# Patient Record
Sex: Female | Born: 1965 | Race: White | Hispanic: No | Smoking: Never smoker
Health system: Southern US, Community
[De-identification: ages and names within clinical notes are randomized; demographics above are authoritative.]

## PROBLEM LIST (undated history)

## (undated) DIAGNOSIS — T85838A Hemorrhage due to other internal prosthetic devices, implants and grafts, initial encounter: Secondary | ICD-10-CM

## (undated) DIAGNOSIS — F419 Anxiety disorder, unspecified: Secondary | ICD-10-CM

## (undated) DIAGNOSIS — D649 Anemia, unspecified: Secondary | ICD-10-CM

## (undated) DIAGNOSIS — C801 Malignant (primary) neoplasm, unspecified: Secondary | ICD-10-CM

## (undated) DIAGNOSIS — M797 Fibromyalgia: Secondary | ICD-10-CM

## (undated) DIAGNOSIS — F329 Major depressive disorder, single episode, unspecified: Secondary | ICD-10-CM

## (undated) DIAGNOSIS — F32A Depression, unspecified: Secondary | ICD-10-CM

## (undated) DIAGNOSIS — K219 Gastro-esophageal reflux disease without esophagitis: Secondary | ICD-10-CM

## (undated) DIAGNOSIS — K5900 Constipation, unspecified: Secondary | ICD-10-CM

## (undated) HISTORY — PX: GASTRIC BYPASS: SHX52

## (undated) HISTORY — PX: ABDOMINAL HYSTERECTOMY: SHX81

## (undated) HISTORY — DX: Fibromyalgia: M79.7

---

## 2009-04-10 ENCOUNTER — Emergency Department (HOSPITAL_COMMUNITY): Admission: EM | Admit: 2009-04-10 | Discharge: 2009-04-10 | Payer: Self-pay | Admitting: Orthopaedic Surgery

## 2009-08-12 ENCOUNTER — Emergency Department (HOSPITAL_COMMUNITY): Admission: EM | Admit: 2009-08-12 | Discharge: 2009-08-13 | Payer: Self-pay | Admitting: Emergency Medicine

## 2010-09-28 ENCOUNTER — Emergency Department (HOSPITAL_COMMUNITY): Payer: Self-pay

## 2010-09-28 ENCOUNTER — Emergency Department (HOSPITAL_COMMUNITY)
Admission: EM | Admit: 2010-09-28 | Discharge: 2010-09-28 | Disposition: A | Discharge status: Home / Self Care | Payer: Self-pay | Attending: Emergency Medicine | Admitting: Emergency Medicine

## 2010-09-28 DIAGNOSIS — M069 Rheumatoid arthritis, unspecified: Secondary | ICD-10-CM | POA: Insufficient documentation

## 2010-09-28 DIAGNOSIS — R071 Chest pain on breathing: Secondary | ICD-10-CM | POA: Insufficient documentation

## 2010-09-28 DIAGNOSIS — S0003XA Contusion of scalp, initial encounter: Secondary | ICD-10-CM | POA: Insufficient documentation

## 2010-09-28 DIAGNOSIS — S20219A Contusion of unspecified front wall of thorax, initial encounter: Secondary | ICD-10-CM | POA: Insufficient documentation

## 2010-09-28 DIAGNOSIS — S0990XA Unspecified injury of head, initial encounter: Secondary | ICD-10-CM | POA: Insufficient documentation

## 2010-10-06 ENCOUNTER — Emergency Department (HOSPITAL_COMMUNITY)
Admission: EM | Admit: 2010-10-06 | Discharge: 2010-10-06 | Disposition: A | Discharge status: Home / Self Care | Payer: Self-pay | Attending: Emergency Medicine | Admitting: Emergency Medicine

## 2010-10-10 LAB — POCT CARDIAC MARKERS
Myoglobin, poc: 35.8 ng/mL (ref 12–200)
Troponin i, poc: <0.05 ng/mL (ref 0.00–0.09)

## 2010-10-10 LAB — CBC
HCT: 35.6 % — ABNORMAL LOW (ref 36.0–46.0)
Platelets: 193 10*3/uL (ref 150–400)
RBC: 4.12 MIL/uL (ref 3.87–5.11)
RDW: 13.5 % (ref 11.5–15.5)

## 2010-10-10 LAB — URINE CULTURE: Culture: NO GROWTH

## 2010-10-10 LAB — URINALYSIS, ROUTINE W REFLEX MICROSCOPIC
Bilirubin Urine: NEGATIVE
Hgb urine dipstick: NEGATIVE
Ketones, ur: NEGATIVE mg/dL
Nitrite: NEGATIVE
Specific Gravity, Urine: 1.023 (ref 1.005–1.030)
pH: 6 (ref 5.0–8.0)

## 2010-10-10 LAB — POCT I-STAT, CHEM 8
Chloride: 108 mEq/L (ref 96–112)
TCO2: 27 mmol/L (ref 0–100)

## 2010-10-10 LAB — URINE MICROSCOPIC-ADD ON

## 2010-10-10 LAB — COMPREHENSIVE METABOLIC PANEL
ALT: 24 U/L (ref 0–35)
CO2: 28 mEq/L (ref 19–32)
Chloride: 107 mEq/L (ref 96–112)
GFR calc Af Amer: >60 mL/min (ref >60)
Potassium: 3.6 mEq/L (ref 3.5–5.1)
Total Bilirubin: 0.5 mg/dL (ref 0.3–1.2)
Total Protein: 6.7 g/dL (ref 6.0–8.3)

## 2010-10-10 LAB — DIFFERENTIAL
Basophils Absolute: 0 10*3/uL (ref 0.0–0.1)
Monocytes Relative: 9 % (ref 3–12)
Neutro Abs: 2.3 10*3/uL (ref 1.7–7.7)
Neutrophils Relative %: 40 % — ABNORMAL LOW (ref 43–77)

## 2010-10-29 LAB — BASIC METABOLIC PANEL
BUN: 11 mg/dL (ref 6–23)
CO2: 27 mEq/L (ref 19–32)
Calcium: 9.4 mg/dL (ref 8.4–10.5)
Creatinine, Ser: 0.51 mg/dL (ref 0.4–1.2)
GFR calc Af Amer: >60 mL/min (ref >60)
Potassium: 3.7 mEq/L (ref 3.5–5.1)

## 2010-10-29 LAB — POCT CARDIAC MARKERS
CKMB, poc: <1 ng/mL — ABNORMAL LOW (ref 1.0–8.0)
Troponin i, poc: <0.05 ng/mL (ref 0.00–0.09)

## 2010-10-29 LAB — CBC
HCT: 32 % — ABNORMAL LOW (ref 36.0–46.0)
RDW: 13 % (ref 11.5–15.5)

## 2015-04-01 ENCOUNTER — Emergency Department (HOSPITAL_COMMUNITY): Payer: Commercial Managed Care - HMO

## 2015-04-01 ENCOUNTER — Encounter (HOSPITAL_COMMUNITY): Payer: Self-pay | Admitting: *Deleted

## 2015-04-01 ENCOUNTER — Inpatient Hospital Stay (HOSPITAL_COMMUNITY)
Admission: EM | Admit: 2015-04-01 | Discharge: 2015-04-07 | DRG: 373 | Disposition: A | Discharge status: Home Health | Payer: Commercial Managed Care - HMO | Attending: General Surgery | Admitting: General Surgery

## 2015-04-01 DIAGNOSIS — M797 Fibromyalgia: Secondary | ICD-10-CM | POA: Diagnosis present

## 2015-04-01 DIAGNOSIS — Z880 Allergy status to penicillin: Secondary | ICD-10-CM

## 2015-04-01 DIAGNOSIS — D649 Anemia, unspecified: Secondary | ICD-10-CM | POA: Diagnosis present

## 2015-04-01 DIAGNOSIS — K3533 Acute appendicitis with perforation and localized peritonitis, with abscess: Secondary | ICD-10-CM | POA: Insufficient documentation

## 2015-04-01 DIAGNOSIS — B9620 Unspecified Escherichia coli [E. coli] as the cause of diseases classified elsewhere: Secondary | ICD-10-CM | POA: Diagnosis present

## 2015-04-01 DIAGNOSIS — K353 Acute appendicitis with localized peritonitis: Principal | ICD-10-CM | POA: Diagnosis present

## 2015-04-01 DIAGNOSIS — Z888 Allergy status to other drugs, medicaments and biological substances status: Secondary | ICD-10-CM

## 2015-04-01 DIAGNOSIS — Z9884 Bariatric surgery status: Secondary | ICD-10-CM

## 2015-04-01 DIAGNOSIS — K3580 Unspecified acute appendicitis: Secondary | ICD-10-CM

## 2015-04-01 DIAGNOSIS — B954 Other streptococcus as the cause of diseases classified elsewhere: Secondary | ICD-10-CM | POA: Diagnosis present

## 2015-04-01 DIAGNOSIS — R1031 Right lower quadrant pain: Secondary | ICD-10-CM | POA: Diagnosis not present

## 2015-04-01 DIAGNOSIS — K3532 Acute appendicitis with perforation and localized peritonitis, without abscess: Secondary | ICD-10-CM

## 2015-04-01 DIAGNOSIS — E876 Hypokalemia: Secondary | ICD-10-CM | POA: Diagnosis not present

## 2015-04-01 HISTORY — DX: Anemia, unspecified: D64.9

## 2015-04-01 LAB — URINALYSIS, ROUTINE W REFLEX MICROSCOPIC
GLUCOSE, UA: NEGATIVE mg/dL
NITRITE: NEGATIVE
PH: 5.5 (ref 5.0–8.0)
Protein, ur: 100 mg/dL — AB
Specific Gravity, Urine: 1.027 (ref 1.005–1.030)
Urobilinogen, UA: 1 mg/dL (ref 0.0–1.0)

## 2015-04-01 LAB — CBC
HCT: 30.2 % — ABNORMAL LOW (ref 36.0–46.0)
Hemoglobin: 9.2 g/dL — ABNORMAL LOW (ref 12.0–15.0)
MCH: 22.8 pg — ABNORMAL LOW (ref 26.0–34.0)
MCHC: 30.5 g/dL (ref 30.0–36.0)
MCV: 74.9 fL — AB (ref 78.0–100.0)
PLATELETS: 284 10*3/uL (ref 150–400)
RBC: 4.03 MIL/uL (ref 3.87–5.11)
RDW: 16.9 % — AB (ref 11.5–15.5)
WBC: 12.5 10*3/uL — AB (ref 4.0–10.5)

## 2015-04-01 LAB — URINE MICROSCOPIC-ADD ON

## 2015-04-01 LAB — COMPREHENSIVE METABOLIC PANEL
ALK PHOS: 107 U/L (ref 38–126)
ALT: 16 U/L (ref 14–54)
AST: 20 U/L (ref 15–41)
Albumin: 3.6 g/dL (ref 3.5–5.0)
Anion gap: 13 (ref 5–15)
BILIRUBIN TOTAL: 0.8 mg/dL (ref 0.3–1.2)
BUN: 10 mg/dL (ref 6–20)
CHLORIDE: 96 mmol/L — AB (ref 101–111)
CO2: 24 mmol/L (ref 22–32)
CREATININE: 0.58 mg/dL (ref 0.44–1.00)
Calcium: 9.6 mg/dL (ref 8.9–10.3)
GFR calc Af Amer: >60 mL/min (ref >60)
Glucose, Bld: 96 mg/dL (ref 65–99)
Potassium: 3.8 mmol/L (ref 3.5–5.1)
Sodium: 133 mmol/L — ABNORMAL LOW (ref 135–145)
Total Protein: 8 g/dL (ref 6.5–8.1)

## 2015-04-01 LAB — LIPASE, BLOOD: Lipase: 14 U/L — ABNORMAL LOW (ref 22–51)

## 2015-04-01 MED ORDER — SODIUM CHLORIDE 0.9 % IV BOLUS (SEPSIS)
1000.0000 mL | Freq: Once | INTRAVENOUS | Status: AC
  Administered 2015-04-01: 1000 mL via INTRAVENOUS

## 2015-04-01 MED ORDER — IOHEXOL 300 MG/ML  SOLN
100.0000 mL | Freq: Once | INTRAMUSCULAR | Status: AC | PRN
  Administered 2015-04-01: 100 mL via INTRAVENOUS

## 2015-04-01 MED ORDER — MORPHINE SULFATE (PF) 4 MG/ML IV SOLN
4.0000 mg | Freq: Once | INTRAVENOUS | Status: AC
  Administered 2015-04-01: 4 mg via INTRAVENOUS
  Filled 2015-04-01: qty 1

## 2015-04-01 MED ORDER — IOHEXOL 300 MG/ML  SOLN
25.0000 mL | Freq: Once | INTRAMUSCULAR | Status: AC | PRN
  Administered 2015-04-01: 25 mL via ORAL

## 2015-04-01 MED ORDER — SODIUM CHLORIDE 0.9 % IV BOLUS (SEPSIS)
1000.0000 mL | Freq: Once | INTRAVENOUS | Status: AC
  Administered 2015-04-02: 1000 mL via INTRAVENOUS

## 2015-04-01 MED ORDER — CIPROFLOXACIN IN D5W 400 MG/200ML IV SOLN
400.0000 mg | Freq: Once | INTRAVENOUS | Status: AC
  Administered 2015-04-02: 400 mg via INTRAVENOUS
  Filled 2015-04-01: qty 200

## 2015-04-01 MED ORDER — METRONIDAZOLE IN NACL 5-0.79 MG/ML-% IV SOLN
500.0000 mg | Freq: Once | INTRAVENOUS | Status: AC
  Administered 2015-04-02: 500 mg via INTRAVENOUS
  Filled 2015-04-01: qty 100

## 2015-04-01 NOTE — ED Notes (Signed)
Surgery MD at bedside.

## 2015-04-01 NOTE — ED Notes (Signed)
Pt reports RLQ pain with fever x 2 days.  Went to UC and was sent here for possible appy.

## 2015-04-01 NOTE — H&P (Signed)
[name] is an 49 y.o. female.   Chief Complaint: rlq pain HPI: 71 yof with history of lap gastric bypass in [place] presents with just over 2d of worsening rlq pain.  This has not gone away, nothing relieving it. Thought she was constipated/bloated and tried laxative. Has been having bms though.  No vomiting. Some nausea.  Febrile at home yesterday.  Urinating.  She has not ever had colonoscopy.  She presented to er today and underwent ct scan that appears to show perf appy with abscess  Past Medical History  Diagnosis Date  . Anemia   fibromyalgia  Past Surgical History  Procedure Laterality Date  . Abdominal hysterectomy    . Gastric bypass      No family history on file. Social History:  reports that she has never smoked. She does not have any smokeless tobacco history on file. She reports that she drinks alcohol. She reports that she does not use illicit drugs.  Allergies:  Allergies  Allergen Reactions  . Cymbalta [Duloxetine Hcl] Rash  . Penicillins Rash    Just a bad rash, no deathly reactions    meds tramadol, flexeril, lexapro  Results for orders placed or performed during the hospital encounter of 04/01/15 (from the past 48 hour(s))  Lipase, blood     Status: Abnormal   Collection Time: 04/01/15  9:40 PM  Result Value Ref Range   Lipase 14 (L) 22 - 51 U/L  Comprehensive metabolic panel     Status: Abnormal   Collection Time: 04/01/15  9:40 PM  Result Value Ref Range   Sodium 133 (L) 135 - 145 mmol/L   Potassium 3.8 3.5 - 5.1 mmol/L   Chloride 96 (L) 101 - 111 mmol/L   CO2 24 22 - 32 mmol/L   Glucose, Bld 96 65 - 99 mg/dL   BUN 10 6 - 20 mg/dL   Creatinine, Ser 0.58 0.44 - 1.00 mg/dL   Calcium 9.6 8.9 - 10.3 mg/dL   Total Protein 8.0 6.5 - 8.1 g/dL   Albumin 3.6 3.5 - 5.0 g/dL   AST 20 15 - 41 U/L   ALT 16 14 - 54 U/L   Alkaline Phosphatase 107 38 - 126 U/L   Total Bilirubin 0.8 0.3 - 1.2 mg/dL   GFR calc non Af Amer >60 >60 mL/min   GFR calc Af  Amer >60 >60 mL/min    Comment: (NOTE) The eGFR has been calculated using the CKD EPI equation. This calculation has not been validated in all clinical situations. eGFR's persistently <60 mL/min signify possible Chronic Kidney Disease.    Anion gap 13 5 - 15  CBC     Status: Abnormal   Collection Time: 04/01/15  9:40 PM  Result Value Ref Range   WBC 12.5 (H) 4.0 - 10.5 K/uL   RBC 4.03 3.87 - 5.11 MIL/uL   Hemoglobin 9.2 (L) 12.0 - 15.0 g/dL   HCT 30.2 (L) 36.0 - 46.0 %   MCV 74.9 (L) 78.0 - 100.0 fL   MCH 22.8 (L) 26.0 - 34.0 pg   MCHC 30.5 30.0 - 36.0 g/dL   RDW 16.9 (H) 11.5 - 15.5 %   Platelets 284 150 - 400 K/uL  Urinalysis, Routine w reflex microscopic (not at [facility])     Status: Abnormal   Collection Time: 04/01/15  9:55 PM  Result Value Ref Range   Color, Urine YELLOW YELLOW   APPearance CLEAR CLEAR   Specific Gravity, Urine 1.027 1.005 -  1.030   pH 5.5 5.0 - 8.0   Glucose, UA NEGATIVE NEGATIVE mg/dL   Hgb urine dipstick TRACE (A) NEGATIVE   Bilirubin Urine SMALL (A) NEGATIVE   Ketones, ur >80 (A) NEGATIVE mg/dL   Protein, ur 100 (A) NEGATIVE mg/dL   Urobilinogen, UA 1.0 0.0 - 1.0 mg/dL   Nitrite NEGATIVE NEGATIVE   Leukocytes, UA TRACE (A) NEGATIVE  Urine microscopic-add on     Status: None   Collection Time: 04/01/15  9:55 PM  Result Value Ref Range   Squamous Epithelial / LPF RARE RARE   WBC, UA 3-6 <3 WBC/hpf   Urine-Other MUCOUS PRESENT    Ct Abdomen Pelvis W Contrast  04/01/2015   CLINICAL DATA:  Right lower quadrant pain with fever for 2 days  EXAM: CT ABDOMEN AND PELVIS WITH CONTRAST  TECHNIQUE: Multidetector CT imaging of the abdomen and pelvis was performed using the standard protocol following bolus administration of intravenous contrast.  CONTRAST:  4m OMNIPAQUE IOHEXOL 300 MG/ML SOLN, 104mOMNIPAQUE IOHEXOL 300 MG/ML SOLN  COMPARISON:  None.  FINDINGS: Lower chest: Partially visualized pulmonary nodule right lower lobe measuring at least 5 mm. Left lower  lobe pulmonary nodule measures 4 mm.  Hepatobiliary: Normal  Pancreas: Normal  Spleen: Normal  Adrenals/Urinary Tract: Normal  Stomach/Bowel: Status post gastric bypass. Nonobstructive gas pattern. Inferior to the cecal tip, there is a complex multiloculated fluid collection with thick enhancing walls measuring about 6.5 x 4.5 cm. There is surrounding inflammatory change. 5 mm focus of calcification is seen along the anterior aspect of this abnormality. Appendix is not well seen, but there is the suggestion of a dilated tubular structure in the region measuring up to about 2 cm.  Vascular/Lymphatic: Minimal atherosclerotic calcification of the aorta  Reproductive: Not seen  Musculoskeletal: No acute findings  IMPRESSION: Appendix not well identified, but the findings are most consistent with appendicitis, perforated, with periappendiceal abscess.  Pulmonary nodules. If the patient is at high risk for bronchogenic carcinoma, follow-up chest CT at 6-12 months is recommended. If the patient is at low risk for bronchogenic carcinoma, follow-up chest CT at 12 months is recommended. This recommendation follows the consensus statement: Guidelines for Management of Small Pulmonary Nodules Detected on CT Scans: A Statement from the [employer] published in Radiology 2005;237:395-400.   Electronically Signed   By: RaSkipper Cliche.D.   On: 04/01/2015 22:44    Review of Systems  Constitutional: Positive for fever and chills.  Respiratory: Negative for shortness of breath.   Cardiovascular: Negative for chest pain.  Gastrointestinal: Positive for nausea, abdominal pain and constipation. Negative for vomiting and diarrhea.  Musculoskeletal: Positive for back pain.    Blood pressure 105/60, pulse 108, temperature 102.1 F (38.9 C), temperature source Oral, resp. rate 18, height 5' 1"  (1.549 m), weight 65.772 kg (145 lb), SpO2 98 %. Physical Exam  Vitals reviewed. Constitutional: She appears  well-developed and well-nourished.  HENT:  Head: Normocephalic and atraumatic.  Eyes: No scleral icterus.  Neck: Neck supple.  Cardiovascular: Regular rhythm.  Tachycardia present.   Respiratory: Effort normal and breath sounds normal. She has no wheezes. She has no rales.  GI: Soft. Bowel sounds are normal. There is tenderness in the right lower quadrant. There is tenderness at McBurney's point. No hernia.  Multiple well healed scars      Assessment/Plan Perforated appendicitis  It appears clinically and radiologically she has perf appendicitis with abscess formation.  Will admit, iv abx(pcn allergy), hydrate,  recheck labs in am.  Will ask ir to see if amenable to ct guided drainage.  Discussed treatment with abx and drains for time being. If improves then will treat that way with consideration of interval appy in 6-8 weeks.  Will also need csc at some point.  If no better or cannot drain then will need to consider surgery.  Discussed plan with patient and her husband  [facility] 04/01/2015, 11:56 PM

## 2015-04-01 NOTE — ED Notes (Signed)
Patient transported to CT 

## 2015-04-01 NOTE — ED Notes (Signed)
Bed: FT73 Expected date:  Expected time:  Means of arrival:  Comments: T2

## 2015-04-01 NOTE — ED Provider Notes (Signed)
CSN: [id number]     Arrival date & time 04/01/15  2036 History   First MD Initiated Contact with Patient 04/01/15 2122     Chief Complaint  Patient presents with  . Abdominal Pain     (Consider location/radiation/quality/duration/timing/severity/associated sxs/prior Treatment) HPI [name] is a 49 y.o. female with history of anemia, comes in for evaluation of abdominal pain. Patient is referred here from urgent care facility for rule out appendicitis. Patient states for the past 2 days she has had right-sided abdominal pain with associated fever. She reports a fever of 102F. She also reports she has had this abdominal pain intermittently since September of last year, but is worsened and become constant over the past 2 days. She denies any nausea, vomiting, chest pain or shortness of breath, urinary symptoms, pelvic pain, diarrhea or constipation. She likens her pain to "gas pains". She rates it as a 7/10. No other aggravating or modifying factors.  Past Medical History  Diagnosis Date  . Anemia    Past Surgical History  Procedure Laterality Date  . Abdominal hysterectomy    . Gastric bypass     No family history on file. Social History  Substance Use Topics  . Smoking status: Never Smoker   . Smokeless tobacco: None  . Alcohol Use: Yes   OB History    No data available     Review of Systems A 10 point review of systems was completed and was negative except for pertinent positives and negatives as mentioned in the history of present illness     Allergies  Cymbalta and Penicillins  Home Medications   Prior to Admission medications   Medication Sig Start Date End Date Taking? Authorizing Provider  acetaminophen (TYLENOL) 500 MG tablet Take 1,000 mg by mouth every 6 (six) hours as needed for fever.   Yes Historical Provider, MD  cyclobenzaprine (FLEXERIL) 10 MG tablet Take 10 mg by mouth 3 (three) times daily as needed for muscle spasms.   Yes Historical Provider, MD   escitalopram (LEXAPRO) 10 MG tablet Take 10 mg by mouth daily.   Yes Historical Provider, MD  ferrous sulfate 325 (65 FE) MG tablet Take 325 mg by mouth 2 (two) times daily with a meal.   Yes Historical Provider, MD  traMADol (ULTRAM) 50 MG tablet Take 50 mg by mouth every 6 (six) hours as needed for moderate pain.   Yes Historical Provider, MD  vitamin B-12 (CYANOCOBALAMIN) 1000 MCG tablet Take 500 mcg by mouth daily.   Yes Historical Provider, MD  vitamin C (ASCORBIC ACID) 500 MG tablet Take 500 mg by mouth daily.   Yes Historical Provider, MD   BP 105/60 mmHg  Pulse 108  Temp(Src) 102.1 F (38.9 C) (Oral)  Resp 18  Ht 5\' 1"  (1.549 m)  Wt 145 lb (65.772 kg)  BMI 27.41 kg/m2  SpO2 98% Physical Exam  Constitutional: She is oriented to person, place, and time. She appears well-developed and well-nourished.  HENT:  Head: Normocephalic and atraumatic.  Mouth/Throat: Oropharynx is clear and moist.  Eyes: Conjunctivae are normal. Pupils are equal, round, and reactive to light. Right eye exhibits no discharge. Left eye exhibits no discharge. No scleral icterus.  Neck: Neck supple.  Cardiovascular: Regular rhythm and normal heart sounds.   Tachycardic with normal heart sounds  Pulmonary/Chest: Effort normal and breath sounds normal. No respiratory distress. She has no wheezes. She has no rales.  Abdominal: Soft.  Tenderness in right upper quadrant and moderately in  right lower quadrant. Abdomen is otherwise soft, nondistended without peritoneal signs.  Musculoskeletal: She exhibits no tenderness.  Neurological: She is alert and oriented to person, place, and time.  Cranial Nerves II-XII grossly intact  Skin: Skin is warm and dry. No rash noted.  Psychiatric: She has a normal mood and affect.  Nursing note and vitals reviewed.   ED Course  Procedures (including critical care time) Labs Review Labs Reviewed  LIPASE, BLOOD - Abnormal; Notable for the following:    Lipase 14 (*)     All other components within normal limits  COMPREHENSIVE METABOLIC PANEL - Abnormal; Notable for the following:    Sodium 133 (*)    Chloride 96 (*)    All other components within normal limits  CBC - Abnormal; Notable for the following:    WBC 12.5 (*)    Hemoglobin 9.2 (*)    HCT 30.2 (*)    MCV 74.9 (*)    MCH 22.8 (*)    RDW 16.9 (*)    All other components within normal limits  URINALYSIS, ROUTINE W REFLEX MICROSCOPIC (NOT AT [facility]) - Abnormal; Notable for the following:    Hgb urine dipstick TRACE (*)    Bilirubin Urine SMALL (*)    Ketones, ur >80 (*)    Protein, ur 100 (*)    Leukocytes, UA TRACE (*)    All other components within normal limits  URINE MICROSCOPIC-ADD ON    Imaging Review Ct Abdomen Pelvis W Contrast  04/01/2015   CLINICAL DATA:  Right lower quadrant pain with fever for 2 days  EXAM: CT ABDOMEN AND PELVIS WITH CONTRAST  TECHNIQUE: Multidetector CT imaging of the abdomen and pelvis was performed using the standard protocol following bolus administration of intravenous contrast.  CONTRAST:  49mL OMNIPAQUE IOHEXOL 300 MG/ML SOLN, 154mL OMNIPAQUE IOHEXOL 300 MG/ML SOLN  COMPARISON:  None.  FINDINGS: Lower chest: Partially visualized pulmonary nodule right lower lobe measuring at least 5 mm. Left lower lobe pulmonary nodule measures 4 mm.  Hepatobiliary: Normal  Pancreas: Normal  Spleen: Normal  Adrenals/Urinary Tract: Normal  Stomach/Bowel: Status post gastric bypass. Nonobstructive gas pattern. Inferior to the cecal tip, there is a complex multiloculated fluid collection with thick enhancing walls measuring about 6.5 x 4.5 cm. There is surrounding inflammatory change. 5 mm focus of calcification is seen along the anterior aspect of this abnormality. Appendix is not well seen, but there is the suggestion of a dilated tubular structure in the region measuring up to about 2 cm.  Vascular/Lymphatic: Minimal atherosclerotic calcification of the aorta  Reproductive: Not seen   Musculoskeletal: No acute findings  IMPRESSION: Appendix not well identified, but the findings are most consistent with appendicitis, perforated, with periappendiceal abscess.  Pulmonary nodules. If the patient is at high risk for bronchogenic carcinoma, follow-up chest CT at 6-12 months is recommended. If the patient is at low risk for bronchogenic carcinoma, follow-up chest CT at 12 months is recommended. This recommendation follows the consensus statement: Guidelines for Management of Small Pulmonary Nodules Detected on CT Scans: A Statement from the [place] as published in Radiology 2005;237:395-400.   Electronically Signed   By: Skipper Cliche M.D.   On: 04/01/2015 22:44   I have personally reviewed and evaluated these images and lab results as part of my medical decision-making.   EKG Interpretation None     Meds given in ED:  Medications  sodium chloride 0.9 % bolus 1,000 mL (not administered)  ciprofloxacin (CIPRO) IVPB  400 mg (not administered)  metroNIDAZOLE (FLAGYL) IVPB 500 mg (not administered)  iohexol (OMNIPAQUE) 300 MG/ML solution 25 mL (25 mLs Oral Contrast Given 04/01/15 2130)  sodium chloride 0.9 % bolus 1,000 mL (0 mLs Intravenous Stopped 04/01/15 2257)  morphine 4 MG/ML injection 4 mg (4 mg Intravenous Given 04/01/15 2159)  iohexol (OMNIPAQUE) 300 MG/ML solution 100 mL (100 mLs Intravenous Contrast Given 04/01/15 2227)    New Prescriptions   No medications on file   Filed Vitals:   04/01/15 2110 04/01/15 2300 04/01/15 2330  BP: 114/72 112/65 105/60  Pulse: 121 107 108  Temp: 102.1 F (38.9 C)    TempSrc: Oral    Resp: 18    Height: 5\' 1"  (1.549 m)    Weight: 145 lb (65.772 kg)    SpO2: 100% 98% 98%    MDM  [name] is a 49 y.o. female with history of anemia comes in for evaluation of right lower quadrant pain. Patient has had this pain intermittently over the past year, worse over the last 2 days. Patient last ate at 1:00 PM On arrival patient is  febrile to 102.1, tachycardic to 121. Tachycardia improves to 107 with IV fluids. On exam, patient is tender diffusely throughout right side, worse in right upper quadrant. Basic labs showed leukocytosis of 12.5, hemoglobin is 9.2 CT abdomen pelvis shows findings are most consistent with appendicitis, perforated, with periappendiceal abscess. Discussed with general surgery, Dr. Donne [name] to see in the ED. Patient admitted. Final diagnoses:  Acute appendicitis, unspecified acute appendicitis type        Comer Locket, PA-C 04/02/15 5747  Gareth Morgan, MD 04/03/15 1229

## 2015-04-02 DIAGNOSIS — R1031 Right lower quadrant pain: Secondary | ICD-10-CM | POA: Diagnosis present

## 2015-04-02 DIAGNOSIS — Z888 Allergy status to other drugs, medicaments and biological substances status: Secondary | ICD-10-CM | POA: Diagnosis not present

## 2015-04-02 DIAGNOSIS — K3532 Acute appendicitis with perforation and localized peritonitis, without abscess: Secondary | ICD-10-CM | POA: Diagnosis present

## 2015-04-02 DIAGNOSIS — E876 Hypokalemia: Secondary | ICD-10-CM | POA: Diagnosis not present

## 2015-04-02 DIAGNOSIS — M797 Fibromyalgia: Secondary | ICD-10-CM | POA: Diagnosis present

## 2015-04-02 DIAGNOSIS — B954 Other streptococcus as the cause of diseases classified elsewhere: Secondary | ICD-10-CM | POA: Diagnosis present

## 2015-04-02 DIAGNOSIS — B9620 Unspecified Escherichia coli [E. coli] as the cause of diseases classified elsewhere: Secondary | ICD-10-CM | POA: Diagnosis present

## 2015-04-02 DIAGNOSIS — K3533 Acute appendicitis with perforation and localized peritonitis, with abscess: Secondary | ICD-10-CM | POA: Insufficient documentation

## 2015-04-02 DIAGNOSIS — K353 Acute appendicitis with localized peritonitis: Secondary | ICD-10-CM | POA: Diagnosis present

## 2015-04-02 DIAGNOSIS — D649 Anemia, unspecified: Secondary | ICD-10-CM | POA: Diagnosis present

## 2015-04-02 DIAGNOSIS — Z880 Allergy status to penicillin: Secondary | ICD-10-CM | POA: Diagnosis not present

## 2015-04-02 DIAGNOSIS — Z9884 Bariatric surgery status: Secondary | ICD-10-CM | POA: Diagnosis not present

## 2015-04-02 LAB — CBC
HCT: 26 % — ABNORMAL LOW (ref 36.0–46.0)
HEMOGLOBIN: 7.9 g/dL — AB (ref 12.0–15.0)
MCH: 22.8 pg — AB (ref 26.0–34.0)
MCHC: 30.4 g/dL (ref 30.0–36.0)
MCV: 74.9 fL — ABNORMAL LOW (ref 78.0–100.0)
PLATELETS: 253 10*3/uL (ref 150–400)
RBC: 3.47 MIL/uL — AB (ref 3.87–5.11)
RDW: 17.1 % — ABNORMAL HIGH (ref 11.5–15.5)
WBC: 10.9 10*3/uL — AB (ref 4.0–10.5)

## 2015-04-02 LAB — BASIC METABOLIC PANEL
ANION GAP: 8 (ref 5–15)
BUN: 5 mg/dL — ABNORMAL LOW (ref 6–20)
CALCIUM: 8.6 mg/dL — AB (ref 8.9–10.3)
CO2: 22 mmol/L (ref 22–32)
CREATININE: 0.51 mg/dL (ref 0.44–1.00)
Chloride: 105 mmol/L (ref 101–111)
GFR calc non Af Amer: >60 mL/min (ref >60)
Glucose, Bld: 95 mg/dL (ref 65–99)
Potassium: 3.3 mmol/L — ABNORMAL LOW (ref 3.5–5.1)
SODIUM: 135 mmol/L (ref 135–145)

## 2015-04-02 LAB — APTT: APTT: 38 s — AB (ref 24–37)

## 2015-04-02 LAB — GENTAMICIN LEVEL, RANDOM

## 2015-04-02 LAB — PROTIME-INR
INR: 1.48 (ref 0.00–1.49)
PROTHROMBIN TIME: 18 s — AB (ref 11.6–15.2)

## 2015-04-02 MED ORDER — ONDANSETRON HCL 4 MG/2ML IJ SOLN: 4.0000 mg | Freq: Four times a day (QID) | INTRAMUSCULAR | Status: DC | PRN

## 2015-04-02 MED ORDER — PANTOPRAZOLE SODIUM 40 MG IV SOLR
40.0000 mg | Freq: Every day | INTRAVENOUS | Status: DC
  Administered 2015-04-02 – 2015-04-06 (×5): 40 mg via INTRAVENOUS
  Filled 2015-04-02 (×5): qty 40

## 2015-04-02 MED ORDER — PNEUMOCOCCAL VAC POLYVALENT 25 MCG/0.5ML IJ INJ: 0.5000 mL | INJECTION | INTRAMUSCULAR | Status: DC

## 2015-04-02 MED ORDER — GENTAMICIN SULFATE 40 MG/ML IJ SOLN
7.0000 mg/kg | Freq: Every day | INTRAVENOUS | Status: DC
  Administered 2015-04-03 – 2015-04-06 (×4): 390 mg via INTRAVENOUS
  Filled 2015-04-02 (×4): qty 9.75

## 2015-04-02 MED ORDER — ACETAMINOPHEN 650 MG RE SUPP: 650.0000 mg | Freq: Four times a day (QID) | RECTAL | Status: DC | PRN

## 2015-04-02 MED ORDER — METRONIDAZOLE IN NACL 5-0.79 MG/ML-% IV SOLN
500.0000 mg | Freq: Three times a day (TID) | INTRAVENOUS | Status: DC
  Administered 2015-04-02 – 2015-04-06 (×12): 500 mg via INTRAVENOUS
  Filled 2015-04-02 (×15): qty 100

## 2015-04-02 MED ORDER — ONDANSETRON 4 MG PO TBDP
4.0000 mg | ORAL_TABLET | Freq: Four times a day (QID) | ORAL | Status: DC | PRN
  Administered 2015-04-05: 4 mg via ORAL
  Filled 2015-04-02: qty 1

## 2015-04-02 MED ORDER — MORPHINE SULFATE (PF) 2 MG/ML IV SOLN
2.0000 mg | INTRAVENOUS | Status: DC | PRN
  Administered 2015-04-02 – 2015-04-06 (×17): 2 mg via INTRAVENOUS
  Filled 2015-04-02 (×18): qty 1

## 2015-04-02 MED ORDER — CIPROFLOXACIN IN D5W 400 MG/200ML IV SOLN
400.0000 mg | Freq: Two times a day (BID) | INTRAVENOUS | Status: DC
  Administered 2015-04-02 – 2015-04-06 (×8): 400 mg via INTRAVENOUS
  Filled 2015-04-02 (×9): qty 200

## 2015-04-02 MED ORDER — GENTAMICIN SULFATE 40 MG/ML IJ SOLN
7.0000 mg/kg | Freq: Every day | INTRAVENOUS | Status: DC
  Administered 2015-04-02: 390 mg via INTRAVENOUS
  Filled 2015-04-02 (×2): qty 9.75

## 2015-04-02 MED ORDER — POTASSIUM CHLORIDE 10 MEQ/100ML IV SOLN
10.0000 meq | INTRAVENOUS | Status: AC
  Administered 2015-04-02 (×5): 10 meq via INTRAVENOUS
  Filled 2015-04-02 (×3): qty 100

## 2015-04-02 MED ORDER — SODIUM CHLORIDE 0.9 % IV SOLN
INTRAVENOUS | Status: DC
  Administered 2015-04-02 – 2015-04-06 (×6): via INTRAVENOUS

## 2015-04-02 MED ORDER — SIMETHICONE 80 MG PO CHEW: 40.0000 mg | CHEWABLE_TABLET | Freq: Four times a day (QID) | ORAL | Status: DC | PRN

## 2015-04-02 MED ORDER — ENOXAPARIN SODIUM 40 MG/0.4ML ~~LOC~~ SOLN: 40.0000 mg | SUBCUTANEOUS | Status: DC

## 2015-04-02 MED ORDER — ACETAMINOPHEN 325 MG PO TABS
650.0000 mg | ORAL_TABLET | Freq: Four times a day (QID) | ORAL | Status: DC | PRN
  Administered 2015-04-02 – 2015-04-06 (×10): 650 mg via ORAL
  Filled 2015-04-02 (×10): qty 2

## 2015-04-02 MED ORDER — METHOCARBAMOL 1000 MG/10ML IJ SOLN
500.0000 mg | Freq: Three times a day (TID) | INTRAMUSCULAR | Status: DC | PRN
  Filled 2015-04-02: qty 5

## 2015-04-02 NOTE — Progress Notes (Signed)
Subjective: Feels a little better  Objective: Vital signs in last 24 hours: Temp:  [98.7 F (37.1 C)-102.1 F (38.9 C)] 98.7 F (37.1 C) (09/08 0355) Pulse Rate:  [85-121] 86 (09/08 0355) Resp:  [16-18] 16 (09/08 0355) BP: (98-114)/(51-72) 110/60 mmHg (09/08 0355) SpO2:  [91 %-100 %] 100 % (09/08 0355) Weight:  [65.772 kg (145 lb)-67.314 kg (148 lb 6.4 oz)] 67.314 kg (148 lb 6.4 oz) (09/08 0355) Last BM Date: 04/01/15  Intake/Output from previous day:   Intake/Output this shift:    GI: soft tender rlq   Lab Results:   Recent Labs  04/01/15 2140 04/02/15 0400  WBC 12.5* 10.9*  HGB 9.2* 7.9*  HCT 30.2* 26.0*  PLT 284 253   BMET  Recent Labs  04/01/15 2140 04/02/15 0400  NA 133* 135  K 3.8 3.3*  CL 96* 105  CO2 24 22  GLUCOSE 96 95  BUN 10 5*  CREATININE 0.58 0.51  CALCIUM 9.6 8.6*   PT/INR No results for input(s): LABPROT, INR in the last 72 hours. ABG No results for input(s): PHART, HCO3 in the last 72 hours.  Invalid input(s): PCO2, PO2  Studies/Results: Ct Abdomen Pelvis W Contrast  04/01/2015   CLINICAL DATA:  Right lower quadrant pain with fever for 2 days  EXAM: CT ABDOMEN AND PELVIS WITH CONTRAST  TECHNIQUE: Multidetector CT imaging of the abdomen and pelvis was performed using the standard protocol following bolus administration of intravenous contrast.  CONTRAST:  24mL OMNIPAQUE IOHEXOL 300 MG/ML SOLN, 126mL OMNIPAQUE IOHEXOL 300 MG/ML SOLN  COMPARISON:  None.  FINDINGS: Lower chest: Partially visualized pulmonary nodule right lower lobe measuring at least 5 mm. Left lower lobe pulmonary nodule measures 4 mm.  Hepatobiliary: Normal  Pancreas: Normal  Spleen: Normal  Adrenals/Urinary Tract: Normal  Stomach/Bowel: Status post gastric bypass. Nonobstructive gas pattern. Inferior to the cecal tip, there is a complex multiloculated fluid collection with thick enhancing walls measuring about 6.5 x 4.5 cm. There is surrounding inflammatory change. 5 mm  focus of calcification is seen along the anterior aspect of this abnormality. Appendix is not well seen, but there is the suggestion of a dilated tubular structure in the region measuring up to about 2 cm.  Vascular/Lymphatic: Minimal atherosclerotic calcification of the aorta  Reproductive: Not seen  Musculoskeletal: No acute findings  IMPRESSION: Appendix not well identified, but the findings are most consistent with appendicitis, perforated, with periappendiceal abscess.  Pulmonary nodules. If the patient is at high risk for bronchogenic carcinoma, follow-up chest CT at 6-12 months is recommended. If the patient is at low risk for bronchogenic carcinoma, follow-up chest CT at 12 months is recommended. This recommendation follows the consensus statement: Guidelines for Management of Small Pulmonary Nodules Detected on CT Scans: A Statement from the [place] as published in Radiology 2005;237:395-400.   Electronically Signed   By: Skipper Cliche M.D.   On: 04/01/2015 22:44    Anti-infectives: Anti-infectives    Start     Dose/Rate Route Frequency Ordered Stop   04/02/15 1200  ciprofloxacin (CIPRO) IVPB 400 mg     400 mg 200 mL/hr over 60 Minutes Intravenous Every 12 hours 04/02/15 0331     04/02/15 1100  metroNIDAZOLE (FLAGYL) IVPB 500 mg     500 mg 100 mL/hr over 60 Minutes Intravenous 3 times per day 04/02/15 0331     04/02/15 0015  gentamicin (GARAMYCIN) 390 mg in dextrose 5 % 100 mL IVPB     7 mg/kg  55 kg (Adjusted) 109.8 mL/hr over 60 Minutes Intravenous Daily at bedtime 04/02/15 0011     04/02/15 0000  ciprofloxacin (CIPRO) IVPB 400 mg     400 mg 200 mL/hr over 60 Minutes Intravenous  Once 04/01/15 2356 04/02/15 0148   04/02/15 0000  metroNIDAZOLE (FLAGYL) IVPB 500 mg     500 mg 100 mL/hr over 60 Minutes Intravenous  Once 04/01/15 2356 04/02/15 0314      Assessment/Plan: HD 2 perf appendicitis  Plan for ir evaluation and hopefully drain placement today Cont abx Will  start lovenox after drain, consider clear liquids Discussed role of surgery for worsening or failure of nonoperative therapy over next 48 hours Hypokalemia- Replace potassium today and recheck in am Anemia- will follow for now  [facility] 04/02/2015

## 2015-04-02 NOTE — Consult Note (Signed)
Chief Complaint: Patient was seen in consultation today for abdominal abscess Chief Complaint  Patient presents with  . Abdominal Pain   at the request of Dr Donne [name]  Referring Physician(s): Dr Rolm Bookbinder  History of Present Illness: [name] is a 49 y.o. female   Rt abd pain constant x 2 days Worsening +fever 102 CT 9/7: IMPRESSION: Appendix not well identified, but the findings are most consistent with appendicitis, perforated, with periappendiceal abscess.  Request for and abscess drain placement Imaging reviewed by Dr Redgie Grayer procedure Now scheduled for same  Past Medical History  Diagnosis Date  . Anemia     Past Surgical History  Procedure Laterality Date  . Abdominal hysterectomy    . Gastric bypass      Allergies: Cymbalta and Penicillins  Medications: Prior to Admission medications   Medication Sig Start Date End Date Taking? Authorizing Provider  acetaminophen (TYLENOL) 500 MG tablet Take 1,000 mg by mouth every 6 (six) hours as needed for fever.   Yes Historical Provider, MD  cyclobenzaprine (FLEXERIL) 10 MG tablet Take 10 mg by mouth 3 (three) times daily as needed for muscle spasms.   Yes Historical Provider, MD  escitalopram (LEXAPRO) 10 MG tablet Take 10 mg by mouth daily.   Yes Historical Provider, MD  ferrous sulfate 325 (65 FE) MG tablet Take 325 mg by mouth 2 (two) times daily with a meal.   Yes Historical Provider, MD  traMADol (ULTRAM) 50 MG tablet Take 50 mg by mouth every 6 (six) hours as needed for moderate pain.   Yes Historical Provider, MD  vitamin B-12 (CYANOCOBALAMIN) 1000 MCG tablet Take 500 mcg by mouth daily.   Yes Historical Provider, MD  vitamin C (ASCORBIC ACID) 500 MG tablet Take 500 mg by mouth daily.   Yes Historical Provider, MD     No family history on file.  Social History   Social History  . Marital Status: Single    Spouse Name: N/A  . Number of Children: N/A  . Years of Education:  N/A   Social History Main Topics  . Smoking status: Never Smoker   . Smokeless tobacco: None  . Alcohol Use: Yes  . Drug Use: No  . Sexual Activity: Not Asked   Other Topics Concern  . None   Social History Narrative  . None    Review of Systems: A 12 point ROS discussed and pertinent positives are indicated in the HPI above.  All other systems are negative.  Review of Systems  Constitutional: Positive for fever, chills, activity change, appetite change and fatigue.  Respiratory: Negative for shortness of breath.   Gastrointestinal: Positive for nausea and abdominal pain.  Neurological: Positive for weakness.  Psychiatric/Behavioral: Negative for behavioral problems and confusion.    Vital Signs: BP 110/60 mmHg  Pulse 86  Temp(Src) 98.7 F (37.1 C) (Oral)  Resp 16  Ht 5\' 1"  (1.549 m)  Wt 148 lb 6.4 oz (67.314 kg)  BMI 28.05 kg/m2  SpO2 100%  Physical Exam  Constitutional: She is oriented to person, place, and time.  Cardiovascular: Normal rate, regular rhythm and normal heart sounds.   Pulmonary/Chest: Effort normal.  Abdominal: Soft. Bowel sounds are normal. There is tenderness.  Musculoskeletal: Normal range of motion.  Neurological: She is alert and oriented to person, place, and time.  Skin: Skin is warm and dry.  Psychiatric: She has a normal mood and affect. Her behavior is normal. Judgment and thought content normal.  Nursing  note and vitals reviewed.   Mallampati Score:  MD Evaluation Airway: WNL Heart: WNL Abdomen: WNL Chest/ Lungs: WNL ASA  Classification: 2 Mallampati/Airway Score: One  Imaging: Ct Abdomen Pelvis W Contrast  04/01/2015   CLINICAL DATA:  Right lower quadrant pain with fever for 2 days  EXAM: CT ABDOMEN AND PELVIS WITH CONTRAST  TECHNIQUE: Multidetector CT imaging of the abdomen and pelvis was performed using the standard protocol following bolus administration of intravenous contrast.  CONTRAST:  4mL OMNIPAQUE IOHEXOL 300 MG/ML  SOLN, 146mL OMNIPAQUE IOHEXOL 300 MG/ML SOLN  COMPARISON:  None.  FINDINGS: Lower chest: Partially visualized pulmonary nodule right lower lobe measuring at least 5 mm. Left lower lobe pulmonary nodule measures 4 mm.  Hepatobiliary: Normal  Pancreas: Normal  Spleen: Normal  Adrenals/Urinary Tract: Normal  Stomach/Bowel: Status post gastric bypass. Nonobstructive gas pattern. Inferior to the cecal tip, there is a complex multiloculated fluid collection with thick enhancing walls measuring about 6.5 x 4.5 cm. There is surrounding inflammatory change. 5 mm focus of calcification is seen along the anterior aspect of this abnormality. Appendix is not well seen, but there is the suggestion of a dilated tubular structure in the region measuring up to about 2 cm.  Vascular/Lymphatic: Minimal atherosclerotic calcification of the aorta  Reproductive: Not seen  Musculoskeletal: No acute findings  IMPRESSION: Appendix not well identified, but the findings are most consistent with appendicitis, perforated, with periappendiceal abscess.  Pulmonary nodules. If the patient is at high risk for bronchogenic carcinoma, follow-up chest CT at 6-12 months is recommended. If the patient is at low risk for bronchogenic carcinoma, follow-up chest CT at 12 months is recommended. This recommendation follows the consensus statement: Guidelines for Management of Small Pulmonary Nodules Detected on CT Scans: A Statement from the [employer] as published in Radiology 2005;237:395-400.   Electronically Signed   By: Skipper Cliche M.D.   On: 04/01/2015 22:44    Labs:  CBC:  Recent Labs  04/01/15 2140 04/02/15 0400  WBC 12.5* 10.9*  HGB 9.2* 7.9*  HCT 30.2* 26.0*  PLT 284 253    COAGS: No results for input(s): INR, APTT in the last 8760 hours.  BMP:  Recent Labs  04/01/15 2140 04/02/15 0400  NA 133* 135  K 3.8 3.3*  CL 96* 105  CO2 24 22  GLUCOSE 96 95  BUN 10 5*  CALCIUM 9.6 8.6*  CREATININE 0.58 0.51    GFRNONAA >60 >60  GFRAA >60 >60    LIVER FUNCTION TESTS:  Recent Labs  04/01/15 2140  BILITOT 0.8  AST 20  ALT 16  ALKPHOS 107  PROT 8.0  ALBUMIN 3.6    TUMOR MARKERS: No results for input(s): AFPTM, CEA, CA199, CHROMGRNA in the last 8760 hours.  Assessment and Plan:  Abd pain x 2 days + fever CT shows Abd abscess--perforated appendix Scheduled for abscess drain placement in Radiology Risks and Benefits discussed with the patient including bleeding, infection, damage to adjacent structures, bowel perforation/fistula connection, and sepsis. All of the patient's questions were answered, patient is agreeable to proceed. Consent signed and in chart.   Thank you for this interesting consult.  I greatly enjoyed meeting [name] and look forward to participating in their care.  A copy of this report was sent to the requesting provider on this date.  Signed: Loa Idler A 04/02/2015, 9:29 AM   I spent a total of 40 Minutes    in face to face in clinical consultation, greater than  50% of which was counseling/coordinating care for and abscess drain

## 2015-04-02 NOTE — Progress Notes (Signed)
ANTIBIOTIC CONSULT NOTE - INITIAL  Pharmacy Consult for Gentamicin Indication: Sepsis  Allergies  Allergen Reactions  . Cymbalta [Duloxetine Hcl] Rash  . Penicillins Rash    Just a bad rash, no deathly reactions    Patient Measurements: Height: 5\' 1"  (154.9 cm) Weight: 145 lb (65.772 kg) IBW/kg (Calculated) : 47.8   Vital Signs: Temp: 101.6 F (38.7 C) (09/08 0014) Temp Source: Oral (09/08 0014) BP: 106/55 mmHg (09/08 0030) Pulse Rate: 98 (09/08 0030) Intake/Output from previous day:   Intake/Output from this shift:    Labs:  Recent Labs  04/01/15 2140  WBC 12.5*  HGB 9.2*  PLT 284  CREATININE 0.58   Estimated Creatinine Clearance: 72.2 mL/min (by C-G formula based on Cr of 0.58). No results for input(s): VANCOTROUGH, VANCOPEAK, VANCORANDOM, GENTTROUGH, GENTPEAK, GENTRANDOM, TOBRATROUGH, TOBRAPEAK, TOBRARND, AMIKACINPEAK, AMIKACINTROU, AMIKACIN in the last 72 hours.   Microbiology: No results found for this or any previous visit (from the past 720 hour(s)).  Medical History: Past Medical History  Diagnosis Date  . Anemia     Medications:   (Not in a hospital admission) Scheduled:   Infusions:  . ciprofloxacin 400 mg (04/02/15 0021)  . gentamicin    . metronidazole     Assessment: 32 yoF with hx gastric bypass now with 2 day hx of worsening RLQ pain.  CT reveals probable perforated appendectomy. Gentamicin per Rx for [facility]AI.   Goal of Therapy:  Gentamicin trough level <2 mcg/ml  Plan:   Gentamicin 390mg  IV q24h  F/u SCr/cultures/levels as needed  Lawana Pai R 04/02/2015,1:18 AM

## 2015-04-02 NOTE — Progress Notes (Signed)
ANTIBIOTIC CONSULT NOTE  Pharmacy Consult for Gentamicin Indication: Sepsis  Allergies  Allergen Reactions  . Cymbalta [Duloxetine Hcl] Rash  . Penicillins Rash    Just a bad rash, no deathly reactions    Patient Measurements: Height: 5\' 1"  (154.9 cm) Weight: 148 lb 6.4 oz (67.314 kg) IBW/kg (Calculated) : 47.8   Vital Signs: Temp: 102.7 F (39.3 C) (09/08 2131) Temp Source: Oral (09/08 1815) BP: 101/63 mmHg (09/08 1815) Pulse Rate: 114 (09/08 1815) Intake/Output from previous day:   Intake/Output from this shift:    Labs:  Recent Labs  04/01/15 2140 04/02/15 0400  WBC 12.5* 10.9*  HGB 9.2* 7.9*  PLT 284 253  CREATININE 0.58 0.51   Estimated Creatinine Clearance: 73 mL/min (by C-G formula based on Cr of 0.51).  Recent Labs  04/02/15 1530  GENTRANDOM <0.5     Microbiology: No results found for this or any previous visit (from the past 720 hour(s)).  Medical History: Past Medical History  Diagnosis Date  . Anemia     Medications:  Prescriptions prior to admission  Medication Sig Dispense Refill Last Dose  . acetaminophen (TYLENOL) 500 MG tablet Take 1,000 mg by mouth every 6 (six) hours as needed for fever.   04/01/2015 at Unknown time  . cyclobenzaprine (FLEXERIL) 10 MG tablet Take 10 mg by mouth 3 (three) times daily as needed for muscle spasms.   03/31/2015 at Unknown time  . escitalopram (LEXAPRO) 10 MG tablet Take 10 mg by mouth daily.   03/31/2015 at Unknown time  . ferrous sulfate 325 (65 FE) MG tablet Take 325 mg by mouth 2 (two) times daily with a meal.   03/31/2015 at Unknown time  . traMADol (ULTRAM) 50 MG tablet Take 50 mg by mouth every 6 (six) hours as needed for moderate pain.   04/01/2015 at Unknown time  . vitamin B-12 (CYANOCOBALAMIN) 1000 MCG tablet Take 500 mcg by mouth daily.   03/31/2015 at Unknown time  . vitamin C (ASCORBIC ACID) 500 MG tablet Take 500 mg by mouth daily.   03/31/2015 at Unknown time   Scheduled:  . ciprofloxacin  400 mg  Intravenous Q12H   And  . metronidazole  500 mg Intravenous 3 times per day  . [START ON 04/03/2015] gentamicin  7 mg/kg (Adjusted) Intravenous Daily  . pantoprazole (PROTONIX) IV  40 mg Intravenous QHS  . [START ON 04/03/2015] pneumococcal 23 valent vaccine  0.5 mL Intramuscular Tomorrow-1000   Infusions:  . sodium chloride 125 mL/hr at 04/02/15 1252   Assessment: 20 yoF with hx gastric bypass with hx of worsening RLQ pain. Day #1 of abx for abdominal infection. Xray showed perforated appendicitis with abscess formation. Scheduled for abscess drain placement in Radiology. May need surgery but will try conservative management for now. No cx's drawn. Afebrile, WBC slightly elevated at 10.9. Adj BW for gent dosing is 55.3 kg.  9/8 Ciprofloxacin >> 9/8 Gentamicin >> 9/8 Flagyl >>  12.5h level <0.5 - ok to continue Gent q24h extended interval dosing per [facility] Extended Interval Dosing Nomogram.  Goal of Therapy:  Gentamicin trough level <2 mcg/ml  Plan:  Continue Gentamicin 390mg  IV q24h Cipro 400mg  IV Q12 per MD Flagyl 500mg  IV Q8 per MD Monitor clinical picture, renal function, Gent trough prn F/U abx deescalation / LOT  Need for double gram negative coverage?  Elicia Lamp, PharmD Clinical Pharmacist Pager [phone] 04/02/2015 9:50 PM

## 2015-04-03 ENCOUNTER — Inpatient Hospital Stay (HOSPITAL_COMMUNITY): Payer: Commercial Managed Care - HMO

## 2015-04-03 LAB — BASIC METABOLIC PANEL
ANION GAP: 9 (ref 5–15)
BUN: <5 mg/dL — ABNORMAL LOW (ref 6–20)
CALCIUM: 8.7 mg/dL — AB (ref 8.9–10.3)
CO2: 22 mmol/L (ref 22–32)
Chloride: 102 mmol/L (ref 101–111)
Creatinine, Ser: 0.51 mg/dL (ref 0.44–1.00)
GFR calc non Af Amer: >60 mL/min (ref >60)
Glucose, Bld: 100 mg/dL — ABNORMAL HIGH (ref 65–99)
POTASSIUM: 3.7 mmol/L (ref 3.5–5.1)
Sodium: 133 mmol/L — ABNORMAL LOW (ref 135–145)

## 2015-04-03 LAB — CBC
HEMATOCRIT: 25 % — AB (ref 36.0–46.0)
HEMOGLOBIN: 7.8 g/dL — AB (ref 12.0–15.0)
MCH: 23.5 pg — ABNORMAL LOW (ref 26.0–34.0)
MCHC: 31.2 g/dL (ref 30.0–36.0)
MCV: 75.3 fL — ABNORMAL LOW (ref 78.0–100.0)
Platelets: 247 10*3/uL (ref 150–400)
RBC: 3.32 MIL/uL — AB (ref 3.87–5.11)
RDW: 17.2 % — AB (ref 11.5–15.5)
WBC: 11.7 10*3/uL — AB (ref 4.0–10.5)

## 2015-04-03 MED ORDER — ENOXAPARIN SODIUM 40 MG/0.4ML ~~LOC~~ SOLN
40.0000 mg | SUBCUTANEOUS | Status: DC
  Administered 2015-04-03 – 2015-04-06 (×4): 40 mg via SUBCUTANEOUS
  Filled 2015-04-03 (×4): qty 0.4

## 2015-04-03 MED ORDER — FENTANYL CITRATE (PF) 100 MCG/2ML IJ SOLN
INTRAMUSCULAR | Status: AC
  Filled 2015-04-03: qty 2

## 2015-04-03 MED ORDER — MIDAZOLAM HCL 2 MG/2ML IJ SOLN
INTRAMUSCULAR | Status: AC
  Filled 2015-04-03: qty 2

## 2015-04-03 MED ORDER — MIDAZOLAM HCL 2 MG/2ML IJ SOLN
INTRAMUSCULAR | Status: AC | PRN
  Administered 2015-04-03: 1 mg via INTRAVENOUS
  Administered 2015-04-03 (×2): 0.5 mg via INTRAVENOUS
  Administered 2015-04-03: 1 mg via INTRAVENOUS

## 2015-04-03 MED ORDER — LIDOCAINE HCL 1 % IJ SOLN
INTRAMUSCULAR | Status: AC
  Filled 2015-04-03: qty 20

## 2015-04-03 MED ORDER — LIDOCAINE-EPINEPHRINE 1 %-1:100000 IJ SOLN
INTRAMUSCULAR | Status: AC
  Filled 2015-04-03: qty 1

## 2015-04-03 MED ORDER — FENTANYL CITRATE (PF) 100 MCG/2ML IJ SOLN
INTRAMUSCULAR | Status: AC | PRN
  Administered 2015-04-03: 25 ug via INTRAVENOUS
  Administered 2015-04-03: 50 ug via INTRAVENOUS
  Administered 2015-04-03: 25 ug via INTRAVENOUS
  Administered 2015-04-03: 50 ug via INTRAVENOUS

## 2015-04-03 NOTE — Progress Notes (Signed)
Patient ID: [name], female   DOB: 1965-12-23, 49 y.o.   MRN: [id number]    Subjective: Pt just got back from IR.  Has 2 drains.  Feels a little better.  Thirsty but not really hungry  Objective: Vital signs in last 24 hours: Temp:  [98.4 F (36.9 C)-103 F (39.4 C)] 98.8 F (37.1 C) (09/09 0922) Pulse Rate:  [82-114] 88 (09/09 0922) Resp:  [9-20] 19 (09/09 0922) BP: (92-113)/(51-71) 96/53 mmHg (09/09 0922) SpO2:  [94 %-100 %] 100 % (09/09 0922) Last BM Date: 04/01/15  Intake/Output from previous day: 09/08 0701 - 09/09 0700 In: 4447.9 [I.V.:3247.9; IV Piggyback:1200] Out: -  Intake/Output this shift:    PE: Abd: soft, appropriately tender, both drains with minimal output currently, few BS Heart: regular Lungs: CTAB  Lab Results:   Recent Labs  04/02/15 0400 04/03/15 0425  WBC 10.9* 11.7*  HGB 7.9* 7.8*  HCT 26.0* 25.0*  PLT 253 247   BMET  Recent Labs  04/02/15 0400 04/03/15 0425  NA 135 133*  K 3.3* 3.7  CL 105 102  CO2 22 22  GLUCOSE 95 100*  BUN 5* <5*  CREATININE 0.51 0.51  CALCIUM 8.6* 8.7*   PT/INR  Recent Labs  04/02/15 1110  LABPROT 18.0*  INR 1.48   CMP     Component Value Date/Time   NA 133* 04/03/2015 0425   K 3.7 04/03/2015 0425   CL 102 04/03/2015 0425   CO2 22 04/03/2015 0425   GLUCOSE 100* 04/03/2015 0425   BUN <5* 04/03/2015 0425   CREATININE 0.51 04/03/2015 0425   CALCIUM 8.7* 04/03/2015 0425   PROT 8.0 04/01/2015 2140   ALBUMIN 3.6 04/01/2015 2140   AST 20 04/01/2015 2140   ALT 16 04/01/2015 2140   ALKPHOS 107 04/01/2015 2140   BILITOT 0.8 04/01/2015 2140   GFRNONAA >60 04/03/2015 0425   GFRAA >60 04/03/2015 0425   Lipase     Component Value Date/Time   LIPASE 14* 04/01/2015 2140       Studies/Results: Ct Abdomen Pelvis W Contrast  04/01/2015   CLINICAL DATA:  Right lower quadrant pain with fever for 2 days  EXAM: CT ABDOMEN AND PELVIS WITH CONTRAST  TECHNIQUE: Multidetector CT imaging of the abdomen  and pelvis was performed using the standard protocol following bolus administration of intravenous contrast.  CONTRAST:  68mL OMNIPAQUE IOHEXOL 300 MG/ML SOLN, 131mL OMNIPAQUE IOHEXOL 300 MG/ML SOLN  COMPARISON:  None.  FINDINGS: Lower chest: Partially visualized pulmonary nodule right lower lobe measuring at least 5 mm. Left lower lobe pulmonary nodule measures 4 mm.  Hepatobiliary: Normal  Pancreas: Normal  Spleen: Normal  Adrenals/Urinary Tract: Normal  Stomach/Bowel: Status post gastric bypass. Nonobstructive gas pattern. Inferior to the cecal tip, there is a complex multiloculated fluid collection with thick enhancing walls measuring about 6.5 x 4.5 cm. There is surrounding inflammatory change. 5 mm focus of calcification is seen along the anterior aspect of this abnormality. Appendix is not well seen, but there is the suggestion of a dilated tubular structure in the region measuring up to about 2 cm.  Vascular/Lymphatic: Minimal atherosclerotic calcification of the aorta  Reproductive: Not seen  Musculoskeletal: No acute findings  IMPRESSION: Appendix not well identified, but the findings are most consistent with appendicitis, perforated, with periappendiceal abscess.  Pulmonary nodules. If the patient is at high risk for bronchogenic carcinoma, follow-up chest CT at 6-12 months is recommended. If the patient is at low risk for bronchogenic carcinoma, follow-up  chest CT at 12 months is recommended. This recommendation follows the consensus statement: Guidelines for Management of Small Pulmonary Nodules Detected on CT Scans: A Statement from the [employer] as published in Radiology 2005;237:395-400.   Electronically Signed   By: Skipper Cliche M.D.   On: 04/01/2015 22:44    Anti-infectives: Anti-infectives    Start     Dose/Rate Route Frequency Ordered Stop   04/03/15 0400  gentamicin (GARAMYCIN) 390 mg in dextrose 5 % 100 mL IVPB     7 mg/kg  55 kg (Adjusted) 109.8 mL/hr over 60 Minutes  Intravenous Daily 04/02/15 1424     04/02/15 1200  ciprofloxacin (CIPRO) IVPB 400 mg     400 mg 200 mL/hr over 60 Minutes Intravenous Every 12 hours 04/02/15 0331     04/02/15 1100  metroNIDAZOLE (FLAGYL) IVPB 500 mg     500 mg 100 mL/hr over 60 Minutes Intravenous 3 times per day 04/02/15 0331     04/02/15 0015  gentamicin (GARAMYCIN) 390 mg in dextrose 5 % 100 mL IVPB  Status:  Discontinued     7 mg/kg  55 kg (Adjusted) 109.8 mL/hr over 60 Minutes Intravenous Daily at bedtime 04/02/15 0011 04/02/15 1424   04/02/15 0000  ciprofloxacin (CIPRO) IVPB 400 mg     400 mg 200 mL/hr over 60 Minutes Intravenous  Once 04/01/15 2356 04/02/15 0148   04/02/15 0000  metroNIDAZOLE (FLAGYL) IVPB 500 mg     500 mg 100 mL/hr over 60 Minutes Intravenous  Once 04/01/15 2356 04/02/15 0314       Assessment/Plan  1. Acute perforated appendicitis with abscess -IR drain placement x2 04-03-15 -Cipro/Flagyl/Gentamycin D2 -will give clear liquids -check labs in am -follow fever curve.  Fever this am of 103  2. Anemia -stable 3. Hypokalemia -resolved 4. DVT prophy -SCDs/start lovenox today  LOS: 1 day    Castulo Scarpelli E 04/03/2015, 10:13 AM Pager: [phone]

## 2015-04-03 NOTE — Sedation Documentation (Signed)
Patient is resting comfortably. 

## 2015-04-03 NOTE — Sedation Documentation (Signed)
Patient is restless, c/o pain

## 2015-04-03 NOTE — Sedation Documentation (Signed)
Patient is restless 

## 2015-04-03 NOTE — Procedures (Signed)
Technically successful placement of 2 CT guided 10 Fr drainage catheters placement into the right lower abdominal quadrant. Drain #1 yielded 20 cc of purulent fluid. Drain #2 yielded 10 cc of purulent fluid.   All aspirated samples sent to the laboratory for analysis.   No immediate post procedural complications.   Ronny Bacon, MD Pager #: [phone]

## 2015-04-03 NOTE — Sedation Documentation (Signed)
Patient c/o pain at site.

## 2015-04-04 LAB — CBC
HEMATOCRIT: 23.7 % — AB (ref 36.0–46.0)
HEMOGLOBIN: 7.4 g/dL — AB (ref 12.0–15.0)
MCH: 23.2 pg — AB (ref 26.0–34.0)
MCHC: 31.2 g/dL (ref 30.0–36.0)
MCV: 74.3 fL — AB (ref 78.0–100.0)
Platelets: 268 10*3/uL (ref 150–400)
RBC: 3.19 MIL/uL — ABNORMAL LOW (ref 3.87–5.11)
RDW: 16.8 % — AB (ref 11.5–15.5)
WBC: 7.3 10*3/uL (ref 4.0–10.5)

## 2015-04-04 LAB — BASIC METABOLIC PANEL
Anion gap: 9 (ref 5–15)
CALCIUM: 8.5 mg/dL — AB (ref 8.9–10.3)
CHLORIDE: 104 mmol/L (ref 101–111)
CO2: 21 mmol/L — AB (ref 22–32)
CREATININE: 0.5 mg/dL (ref 0.44–1.00)
GFR calc Af Amer: >60 mL/min (ref >60)
GFR calc non Af Amer: >60 mL/min (ref >60)
GLUCOSE: 124 mg/dL — AB (ref 65–99)
Potassium: 3.4 mmol/L — ABNORMAL LOW (ref 3.5–5.1)
Sodium: 134 mmol/L — ABNORMAL LOW (ref 135–145)

## 2015-04-04 MED ORDER — INFLUENZA VAC SPLIT QUAD 0.5 ML IM SUSY
0.5000 mL | PREFILLED_SYRINGE | INTRAMUSCULAR | Status: DC
  Filled 2015-04-04: qty 0.5

## 2015-04-04 NOTE — Clinical Documentation Improvement (Signed)
Critical Care  Can the diagnosis of anemia be further specified?   Iron deficiency Anemia  Nutritional anemia, including the nutrition or mineral deficits  Acute Blood Loss Anemia on Chronic Blood Loss Anemia, including the suspected or known cause  Anemia of chronic disease, including the associated chronic disease state  Acute Blood Loss Anemia, including the suspected or known cause  Other  Clinically Undetermined  Document any associated diagnoses/conditions.   Supporting Information: Patient with history of anemia per 9/09 progress notes.  Labs: H/H: 9/10:  7.4/23.7 9/09:  7.8/25.0 9/07:  9.2/30.2  Please exercise your independent, professional judgment when responding. A specific answer is not anticipated or expected.  Thank You,  [place] [phone]

## 2015-04-04 NOTE — Progress Notes (Signed)
Patient ID: [name], female   DOB: 12/14/1965, 49 y.o.   MRN: [id number]    Referring Physician(s): [place]  Chief Complaint:  S/P CT guided drain placement x 2 by Dr. Pascal Lux 04/03/15  Subjective:  [name] is feeling better today. She states she still has a little "soreness" in the RLQ, but overall feels better after drain placement yesterday. She is tolerating clear liquids  Allergies: Cymbalta and Penicillins  Medications: Prior to Admission medications   Medication Sig Start Date End Date Taking? Authorizing Provider  acetaminophen (TYLENOL) 500 MG tablet Take 1,000 mg by mouth every 6 (six) hours as needed for fever.   Yes Historical Provider, MD  cyclobenzaprine (FLEXERIL) 10 MG tablet Take 10 mg by mouth 3 (three) times daily as needed for muscle spasms.   Yes Historical Provider, MD  escitalopram (LEXAPRO) 10 MG tablet Take 10 mg by mouth daily.   Yes Historical Provider, MD  ferrous sulfate 325 (65 FE) MG tablet Take 325 mg by mouth 2 (two) times daily with a meal.   Yes Historical Provider, MD  traMADol (ULTRAM) 50 MG tablet Take 50 mg by mouth every 6 (six) hours as needed for moderate pain.   Yes Historical Provider, MD  vitamin B-12 (CYANOCOBALAMIN) 1000 MCG tablet Take 500 mcg by mouth daily.   Yes Historical Provider, MD  vitamin C (ASCORBIC ACID) 500 MG tablet Take 500 mg by mouth daily.   Yes Historical Provider, MD     Vital Signs: BP 115/61 mmHg  Pulse 101  Temp(Src) 99.6 F (37.6 C) (Oral)  Resp 19  Ht 5\' 1"  (1.549 m)  Wt 148 lb 6.4 oz (67.314 kg)  BMI 28.05 kg/m2  SpO2 99%  Physical Exam  Tmax last night 102.1 F, most recent temp 99.6 Awake and Alert 2 drains in place. Both flush easily. No recorded output Appears to be about 20 mls in each bag Drainage is blood tinged/slightly cloudy.  Imaging: Ct Abdomen Pelvis W Contrast  04/01/2015   CLINICAL DATA:  Right lower quadrant pain with fever for 2 days  EXAM: CT ABDOMEN AND PELVIS WITH  CONTRAST  TECHNIQUE: Multidetector CT imaging of the abdomen and pelvis was performed using the standard protocol following bolus administration of intravenous contrast.  CONTRAST:  58mL OMNIPAQUE IOHEXOL 300 MG/ML SOLN, 144mL OMNIPAQUE IOHEXOL 300 MG/ML SOLN  COMPARISON:  None.  FINDINGS: Lower chest: Partially visualized pulmonary nodule right lower lobe measuring at least 5 mm. Left lower lobe pulmonary nodule measures 4 mm.  Hepatobiliary: Normal  Pancreas: Normal  Spleen: Normal  Adrenals/Urinary Tract: Normal  Stomach/Bowel: Status post gastric bypass. Nonobstructive gas pattern. Inferior to the cecal tip, there is a complex multiloculated fluid collection with thick enhancing walls measuring about 6.5 x 4.5 cm. There is surrounding inflammatory change. 5 mm focus of calcification is seen along the anterior aspect of this abnormality. Appendix is not well seen, but there is the suggestion of a dilated tubular structure in the region measuring up to about 2 cm.  Vascular/Lymphatic: Minimal atherosclerotic calcification of the aorta  Reproductive: Not seen  Musculoskeletal: No acute findings  IMPRESSION: Appendix not well identified, but the findings are most consistent with appendicitis, perforated, with periappendiceal abscess.  Pulmonary nodules. If the patient is at high risk for bronchogenic carcinoma, follow-up chest CT at 6-12 months is recommended. If the patient is at low risk for bronchogenic carcinoma, follow-up chest CT at 12 months is recommended. This recommendation follows the consensus statement: Guidelines for  Management of Small Pulmonary Nodules Detected on CT Scans: A Statement from the [place] as published in Radiology 2005;237:395-400.   Electronically Signed   By: Skipper Cliche M.D.   On: 04/01/2015 22:44   Ct Image Guided Drainage By Percutaneous Catheter  04/03/2015   INDICATION: History of ruptured appendicitis. Please perform CT-guided percutaneous drainage catheter  placement.  EXAM: CT IMAGE GUIDED DRAINAGE BY PERCUTANEOUS CATHETER x2  COMPARISON:  CT abdomen pelvis -04/01/2015  MEDICATIONS: The patient is currently admitted to the hospital and receiving intravenous antibiotics. The antibiotics were administered within an appropriate time frame prior to the initiation of the procedure.  ANESTHESIA/SEDATION: Fentanyl 150 mcg IV; Versed 3 mg IV  Total Moderate Sedation time  27 minutes  CONTRAST:  None  COMPLICATIONS: None immediate  PROCEDURE: Informed written consent was obtained from the patient after a discussion of the risks, benefits and alternatives to treatment. The patient was placed supine on the CT gantry and a pre procedural CT was performed re-demonstrating the known complex loculated abscess/fluid collection within the right lower abdominal quadrant measuring at least 7.5 x 5.6 cm (image 64, series 2). The procedure was planned. A timeout was performed prior to the initiation of the procedure.  The skin overlying the right lateral lower abdomen was prepped and draped in the usual sterile fashion. The overlying soft tissues were anesthetized with 1% lidocaine with epinephrine. Appropriate trajectory was planned with the use of a 22 gauge spinal needle. An 18 gauge trocar needle was advanced into the abscess/fluid collection and a short Amplatz super stiff wire was coiled within the collection. Appropriate positioning was confirmed with a limited CT scan. The tract was serially dilated allowing placement of a 10 [place] all-purpose drainage catheter. Appropriate positioning was confirmed with a limited postprocedural CT scan.  Following placement of the initial percutaneous drainage catheter, approximately 20 cc of purulent material was aspirated from the fluid collection. Postprocedural scanning demonstrated a residual additional fluid collection caudal and medial to the initial percutaneous drainage catheter, as such, decision was made to place an additional  percutaneous drain.  Again, the overlying soft tissues were anesthetized 1% lidocaine with epinephrine. Appropriate trajectory was planned with the use of a 22 gauge spinal needle. An 18 gauge trocar needle was advanced into the residual abscess/fluid collection and a short Amplatz super stiff wire was coiled within the collection. Appropriate positioning was confirmed with a limited CT scan. The tract was serially dilated allowing placement of an and additional 10 [place] all-purpose drainage catheter. Appropriate positioning was confirmed with a limited postprocedural CT scan. Approximately 10 ml of purulent fluid was aspirated. The tube was connected to a drainage bag and sutured in place.  All aspirated fluid was capped and sent to the laboratory for analysis. Dressings were placed. The patient tolerated the above procedures well without immediate postprocedural complication.  IMPRESSION: Successful CT guided placement of 2 10 French all purpose drain catheters into the right lower abdominal quadrant with aspiration of of a total of 30 mL of purulent fluid. Samples were sent to the laboratory as requested by the ordering clinical team.   Electronically Signed   By: Sandi Mariscal M.D.   On: 04/03/2015 11:36    Labs:  CBC:  Recent Labs  04/01/15 2140 04/02/15 0400 04/03/15 0425 04/04/15 0401  WBC 12.5* 10.9* 11.7* 7.3  HGB 9.2* 7.9* 7.8* 7.4*  HCT 30.2* 26.0* 25.0* 23.7*  PLT 284 253 247 268    COAGS:  Recent Labs  04/02/15 1110  INR 1.48  APTT 38*    BMP:  Recent Labs  04/01/15 2140 04/02/15 0400 04/03/15 0425 04/04/15 0401  NA 133* 135 133* 134*  K 3.8 3.3* 3.7 3.4*  CL 96* 105 102 104  CO2 24 22 22  21*  GLUCOSE 96 95 100* 124*  BUN 10 5* <5* <5*  CALCIUM 9.6 8.6* 8.7* 8.5*  CREATININE 0.58 0.51 0.51 0.50  GFRNONAA >60 >60 >60 >60  GFRAA >60 >60 >60 >60    LIVER FUNCTION TESTS:  Recent Labs  04/01/15 2140  BILITOT 0.8  AST 20  ALT 16  ALKPHOS 107  PROT 8.0    ALBUMIN 3.6    Assessment and Plan:  Appendiceal abscess S/P RLQ drains x 2 by Dr. Pascal Lux 04/03/2015 to treat appendiceal abscess. WBC down to 7.3 Continue current care and antibiotics  *Note* I did question her about her anemia (Hgb 7.4 today and 9.2 on admission) and the pulmonary nodules seen on CT. She assures me that she is aware of both issues and her PCP is managing this.  Signed: Murrell Redden PA-C 04/04/2015, 8:41 AM   I spent a total of 15 Minutes at the the patient's bedside AND on the patient's hospital floor or unit, greater than 50% of which was counseling/coordinating care for follow up after CT guided drain placement.

## 2015-04-04 NOTE — Progress Notes (Signed)
Subjective: Feels a little bit better Fever cure improving  Objective: Vital signs in last 24 hours: Temp:  [98.8 F (37.1 C)-102.1 F (38.9 C)] 99.6 F (37.6 C) (09/10 0415) Pulse Rate:  [83-115] 101 (09/10 0415) Resp:  [19-20] 19 (09/10 0415) BP: (96-123)/(53-67) 115/61 mmHg (09/10 0415) SpO2:  [96 %-100 %] 99 % (09/10 0415) Last BM Date: 04/01/15  Intake/Output from previous day: 09/09 0701 - 09/10 0700 In: 4772.3 [P.O.:1348; I.V.:3064.6; IV Piggyback:309.8] Out: 1 [Urine:1] Intake/Output this shift:    Comfortable Abdomen soft with minimal tenderness Drains serosang  Lab Results:   Recent Labs  04/03/15 0425 04/04/15 0401  WBC 11.7* 7.3  HGB 7.8* 7.4*  HCT 25.0* 23.7*  PLT 247 268   BMET  Recent Labs  04/03/15 0425 04/04/15 0401  NA 133* 134*  K 3.7 3.4*  CL 102 104  CO2 22 21*  GLUCOSE 100* 124*  BUN <5* <5*  CREATININE 0.51 0.50  CALCIUM 8.7* 8.5*   PT/INR  Recent Labs  04/02/15 1110  LABPROT 18.0*  INR 1.48   ABG No results for input(s): PHART, HCO3 in the last 72 hours.  Invalid input(s): PCO2, PO2  Studies/Results: Ct Image Guided Drainage By Percutaneous Catheter  04/03/2015   INDICATION: History of ruptured appendicitis. Please perform CT-guided percutaneous drainage catheter placement.  EXAM: CT IMAGE GUIDED DRAINAGE BY PERCUTANEOUS CATHETER x2  COMPARISON:  CT abdomen pelvis -04/01/2015  MEDICATIONS: The patient is currently admitted to the hospital and receiving intravenous antibiotics. The antibiotics were administered within an appropriate time frame prior to the initiation of the procedure.  ANESTHESIA/SEDATION: Fentanyl 150 mcg IV; Versed 3 mg IV  Total Moderate Sedation time  27 minutes  CONTRAST:  None  COMPLICATIONS: None immediate  PROCEDURE: Informed written consent was obtained from the patient after a discussion of the risks, benefits and alternatives to treatment. The patient was placed supine on the CT gantry and a pre  procedural CT was performed re-demonstrating the known complex loculated abscess/fluid collection within the right lower abdominal quadrant measuring at least 7.5 x 5.6 cm (image 64, series 2). The procedure was planned. A timeout was performed prior to the initiation of the procedure.  The skin overlying the right lateral lower abdomen was prepped and draped in the usual sterile fashion. The overlying soft tissues were anesthetized with 1% lidocaine with epinephrine. Appropriate trajectory was planned with the use of a 22 gauge spinal needle. An 18 gauge trocar needle was advanced into the abscess/fluid collection and a short Amplatz super stiff wire was coiled within the collection. Appropriate positioning was confirmed with a limited CT scan. The tract was serially dilated allowing placement of a 10 [place] all-purpose drainage catheter. Appropriate positioning was confirmed with a limited postprocedural CT scan.  Following placement of the initial percutaneous drainage catheter, approximately 20 cc of purulent material was aspirated from the fluid collection. Postprocedural scanning demonstrated a residual additional fluid collection caudal and medial to the initial percutaneous drainage catheter, as such, decision was made to place an additional percutaneous drain.  Again, the overlying soft tissues were anesthetized 1% lidocaine with epinephrine. Appropriate trajectory was planned with the use of a 22 gauge spinal needle. An 18 gauge trocar needle was advanced into the residual abscess/fluid collection and a short Amplatz super stiff wire was coiled within the collection. Appropriate positioning was confirmed with a limited CT scan. The tract was serially dilated allowing placement of an and additional 10 [place] all-purpose drainage catheter. Appropriate positioning  was confirmed with a limited postprocedural CT scan. Approximately 10 ml of purulent fluid was aspirated. The tube was connected to a drainage bag  and sutured in place.  All aspirated fluid was capped and sent to the laboratory for analysis. Dressings were placed. The patient tolerated the above procedures well without immediate postprocedural complication.  IMPRESSION: Successful CT guided placement of 2 10 French all purpose drain catheters into the right lower abdominal quadrant with aspiration of of a total of 30 mL of purulent fluid. Samples were sent to the laboratory as requested by the ordering clinical team.   Electronically Signed   By: Sandi Mariscal M.D.   On: 04/03/2015 11:36    Anti-infectives: Anti-infectives    Start     Dose/Rate Route Frequency Ordered Stop   04/03/15 0400  gentamicin (GARAMYCIN) 390 mg in dextrose 5 % 100 mL IVPB     7 mg/kg  55 kg (Adjusted) 109.8 mL/hr over 60 Minutes Intravenous Daily 04/02/15 1424     04/02/15 1200  ciprofloxacin (CIPRO) IVPB 400 mg     400 mg 200 mL/hr over 60 Minutes Intravenous Every 12 hours 04/02/15 0331     04/02/15 1100  metroNIDAZOLE (FLAGYL) IVPB 500 mg     500 mg 100 mL/hr over 60 Minutes Intravenous 3 times per day 04/02/15 0331     04/02/15 0015  gentamicin (GARAMYCIN) 390 mg in dextrose 5 % 100 mL IVPB  Status:  Discontinued     7 mg/kg  55 kg (Adjusted) 109.8 mL/hr over 60 Minutes Intravenous Daily at bedtime 04/02/15 0011 04/02/15 1424   04/02/15 0000  ciprofloxacin (CIPRO) IVPB 400 mg     400 mg 200 mL/hr over 60 Minutes Intravenous  Once 04/01/15 2356 04/02/15 0148   04/02/15 0000  metroNIDAZOLE (FLAGYL) IVPB 500 mg     500 mg 100 mL/hr over 60 Minutes Intravenous  Once 04/01/15 2356 04/02/15 0314      Assessment/Plan: s/p * No surgery found *  Perforated appendicitis s/p IR placement of drains x 2  WBC down, still with fevers.  Will continue IV antibiotics Patient wants to stay on clears today  LOS: 2 days    Marilyne Haseley A 04/04/2015

## 2015-04-05 NOTE — Progress Notes (Signed)
ANTIBIOTIC CONSULT NOTE - FOLLOW UP  Pharmacy Consult for Gentamicin Indication: Perforated appendicitis with abscess  Allergies  Allergen Reactions  . Cymbalta [Duloxetine Hcl] Rash  . Penicillins Rash    Just a bad rash, no deathly reactions    Patient Measurements: Height: 5\' 1"  (154.9 cm) Weight: 148 lb 6.4 oz (67.314 kg) IBW/kg (Calculated) : 47.8 Adjusted Body Weight: 55 kg  Vital Signs: Temp: 98.7 F (37.1 C) (09/11 0615) Temp Source: Oral (09/11 0615) BP: 114/71 mmHg (09/11 0615) Pulse Rate: 88 (09/11 0615) Intake/Output from previous day: 09/10 0701 - 09/11 0700 In: 2799.2 [P.O.:720; I.V.:1679.2; IV Piggyback:400] Out: -  Intake/Output from this shift:    Labs:  Recent Labs  04/03/15 0425 04/04/15 0401  WBC 11.7* 7.3  HGB 7.8* 7.4*  PLT 247 268  CREATININE 0.51 0.50   Estimated Creatinine Clearance: 73 mL/min (by C-G formula based on Cr of 0.5).  Recent Labs  04/02/15 1530  GENTRANDOM <0.5     Microbiology: Recent Results (from the past 720 hour(s))  Culture, routine-abscess     Status: None (Preliminary result)   Collection Time: 04/03/15 11:00 AM  Result Value Ref Range Status   Specimen Description ABSCESS ABDOMEN  Final   Special Requests Normal  Final   Gram Stain   Final    ABUNDANT WBC PRESENT,BOTH PMN AND MONONUCLEAR NO SQUAMOUS EPITHELIAL CELLS SEEN ABUNDANT GRAM POSITIVE COCCI IN PAIRS IN CLUSTERS FEW GRAM NEGATIVE RODS Performed at [facility]    Culture   Final    RARE GRAM NEGATIVE RODS MODERATE MICROAEROPHILIC STREPTOCOCCI Note: Standardized susceptibility testing for this organism is not available. Performed at [facility]    Report Status PENDING  Incomplete    Anti-infectives    Start     Dose/Rate Route Frequency Ordered Stop   04/03/15 0400  gentamicin (GARAMYCIN) 390 mg in dextrose 5 % 100 mL IVPB     7 mg/kg  55 kg (Adjusted) 109.8 mL/hr over 60 Minutes Intravenous Daily 04/02/15 1424     04/02/15 1200  ciprofloxacin (CIPRO) IVPB 400 mg     400 mg 200 mL/hr over 60 Minutes Intravenous Every 12 hours 04/02/15 0331     04/02/15 1100  metroNIDAZOLE (FLAGYL) IVPB 500 mg     500 mg 100 mL/hr over 60 Minutes Intravenous 3 times per day 04/02/15 0331     04/02/15 0015  gentamicin (GARAMYCIN) 390 mg in dextrose 5 % 100 mL IVPB  Status:  Discontinued     7 mg/kg  55 kg (Adjusted) 109.8 mL/hr over 60 Minutes Intravenous Daily at bedtime 04/02/15 0011 04/02/15 1424   04/02/15 0000  ciprofloxacin (CIPRO) IVPB 400 mg     400 mg 200 mL/hr over 60 Minutes Intravenous  Once 04/01/15 2356 04/02/15 0148   04/02/15 0000  metroNIDAZOLE (FLAGYL) IVPB 500 mg     500 mg 100 mL/hr over 60 Minutes Intravenous  Once 04/01/15 2356 04/02/15 0314      Assessment: 62 yoF with hx gastric bypass with hx of worsening RLQ pain. Day #1 of abx for abdominal infection. Xray showed perforated appendicitis with abscess formation.   Now day #4 of abx for perforated appendicitis with abscess formation. Abscess drain placed by IR 9/9 x 2 drains and purulent fluid sent for cx. Growing out rare GNRs and moderate microaerohpilic strep. May need surgery but will try conservative management for now. T max 102.7 on 9/8, now afebrile. WBC down to wnl (peaked at 11.7)  Goal of Therapy:  Gentamicin trough level <2 mcg/ml  Plan:  Continue Gentamicin 390mg  IV q24h Continue cipro 400mg  IV Q12 per MD Cotninue Flagyl 500mg  IV Q8 per MD Monitor clinical picture, renal function, Gent trough prn F/u culture of abscess fluid 9/9, abx deescalation, LOT  Consider stopping gentamicin and continuing cipro and Flagyl  Saige Busby J 04/05/2015,8:51 AM

## 2015-04-05 NOTE — Progress Notes (Signed)
Subjective: Fevers improved Less pain Had BM's  Objective: Vital signs in last 24 hours: Temp:  [98.2 F (36.8 C)-99.9 F (37.7 C)] 98.7 F (37.1 C) (09/11 0615) Pulse Rate:  [88-115] 88 (09/11 0615) Resp:  [18-20] 20 (09/11 0615) BP: (97-114)/(61-75) 114/71 mmHg (09/11 0615) SpO2:  [97 %-100 %] 97 % (09/11 0615) Last BM Date: 04/01/15  Intake/Output from previous day: 09/10 0701 - 09/11 0700 In: 2799.2 [P.O.:720; I.V.:1679.2; IV Piggyback:400] Out: -  Intake/Output this shift:    Abdomen soft, non distended, mildly tender Drains serous  Lab Results:   Recent Labs  04/03/15 0425 04/04/15 0401  WBC 11.7* 7.3  HGB 7.8* 7.4*  HCT 25.0* 23.7*  PLT 247 268   BMET  Recent Labs  04/03/15 0425 04/04/15 0401  NA 133* 134*  K 3.7 3.4*  CL 102 104  CO2 22 21*  GLUCOSE 100* 124*  BUN <5* <5*  CREATININE 0.51 0.50  CALCIUM 8.7* 8.5*   PT/INR  Recent Labs  04/02/15 1110  LABPROT 18.0*  INR 1.48   ABG No results for input(s): PHART, HCO3 in the last 72 hours.  Invalid input(s): PCO2, PO2  Studies/Results: Ct Image Guided Drainage By Percutaneous Catheter  04/03/2015   INDICATION: History of ruptured appendicitis. Please perform CT-guided percutaneous drainage catheter placement.  EXAM: CT IMAGE GUIDED DRAINAGE BY PERCUTANEOUS CATHETER x2  COMPARISON:  CT abdomen pelvis -04/01/2015  MEDICATIONS: The patient is currently admitted to the hospital and receiving intravenous antibiotics. The antibiotics were administered within an appropriate time frame prior to the initiation of the procedure.  ANESTHESIA/SEDATION: Fentanyl 150 mcg IV; Versed 3 mg IV  Total Moderate Sedation time  27 minutes  CONTRAST:  None  COMPLICATIONS: None immediate  PROCEDURE: Informed written consent was obtained from the patient after a discussion of the risks, benefits and alternatives to treatment. The patient was placed supine on the CT gantry and a pre procedural CT was performed  re-demonstrating the known complex loculated abscess/fluid collection within the right lower abdominal quadrant measuring at least 7.5 x 5.6 cm (image 64, series 2). The procedure was planned. A timeout was performed prior to the initiation of the procedure.  The skin overlying the right lateral lower abdomen was prepped and draped in the usual sterile fashion. The overlying soft tissues were anesthetized with 1% lidocaine with epinephrine. Appropriate trajectory was planned with the use of a 22 gauge spinal needle. An 18 gauge trocar needle was advanced into the abscess/fluid collection and a short Amplatz super stiff wire was coiled within the collection. Appropriate positioning was confirmed with a limited CT scan. The tract was serially dilated allowing placement of a 10 [place] all-purpose drainage catheter. Appropriate positioning was confirmed with a limited postprocedural CT scan.  Following placement of the initial percutaneous drainage catheter, approximately 20 cc of purulent material was aspirated from the fluid collection. Postprocedural scanning demonstrated a residual additional fluid collection caudal and medial to the initial percutaneous drainage catheter, as such, decision was made to place an additional percutaneous drain.  Again, the overlying soft tissues were anesthetized 1% lidocaine with epinephrine. Appropriate trajectory was planned with the use of a 22 gauge spinal needle. An 18 gauge trocar needle was advanced into the residual abscess/fluid collection and a short Amplatz super stiff wire was coiled within the collection. Appropriate positioning was confirmed with a limited CT scan. The tract was serially dilated allowing placement of an and additional 10 [place] all-purpose drainage catheter. Appropriate positioning was confirmed  with a limited postprocedural CT scan. Approximately 10 ml of purulent fluid was aspirated. The tube was connected to a drainage bag and sutured in place.  All  aspirated fluid was capped and sent to the laboratory for analysis. Dressings were placed. The patient tolerated the above procedures well without immediate postprocedural complication.  IMPRESSION: Successful CT guided placement of 2 10 French all purpose drain catheters into the right lower abdominal quadrant with aspiration of of a total of 30 mL of purulent fluid. Samples were sent to the laboratory as requested by the ordering clinical team.   Electronically Signed   By: Sandi Mariscal M.D.   On: 04/03/2015 11:36    Anti-infectives: Anti-infectives    Start     Dose/Rate Route Frequency Ordered Stop   04/03/15 0400  gentamicin (GARAMYCIN) 390 mg in dextrose 5 % 100 mL IVPB     7 mg/kg  55 kg (Adjusted) 109.8 mL/hr over 60 Minutes Intravenous Daily 04/02/15 1424     04/02/15 1200  ciprofloxacin (CIPRO) IVPB 400 mg     400 mg 200 mL/hr over 60 Minutes Intravenous Every 12 hours 04/02/15 0331     04/02/15 1100  metroNIDAZOLE (FLAGYL) IVPB 500 mg     500 mg 100 mL/hr over 60 Minutes Intravenous 3 times per day 04/02/15 0331     04/02/15 0015  gentamicin (GARAMYCIN) 390 mg in dextrose 5 % 100 mL IVPB  Status:  Discontinued     7 mg/kg  55 kg (Adjusted) 109.8 mL/hr over 60 Minutes Intravenous Daily at bedtime 04/02/15 0011 04/02/15 1424   04/02/15 0000  ciprofloxacin (CIPRO) IVPB 400 mg     400 mg 200 mL/hr over 60 Minutes Intravenous  Once 04/01/15 2356 04/02/15 0148   04/02/15 0000  metroNIDAZOLE (FLAGYL) IVPB 500 mg     500 mg 100 mL/hr over 60 Minutes Intravenous  Once 04/01/15 2356 04/02/15 0314      Assessment/Plan: s/p * No surgery found *  Perforated appendicitis with abscess s/p IR perc drain placement  Continuing IV antibiotics and drain Advance diet Decrease IVF  LOS: 3 days    Rikia Sukhu A 04/05/2015

## 2015-04-06 LAB — CULTURE, ROUTINE-ABSCESS: SPECIAL REQUESTS: NORMAL

## 2015-04-06 MED ORDER — CLINDAMYCIN HCL 300 MG PO CAPS
300.0000 mg | ORAL_CAPSULE | Freq: Three times a day (TID) | ORAL | Status: DC
  Administered 2015-04-06 – 2015-04-07 (×3): 300 mg via ORAL
  Filled 2015-04-06 (×3): qty 1

## 2015-04-06 MED ORDER — METRONIDAZOLE 500 MG PO TABS
500.0000 mg | ORAL_TABLET | Freq: Three times a day (TID) | ORAL | Status: DC
  Administered 2015-04-06 (×2): 500 mg via ORAL
  Filled 2015-04-06 (×2): qty 1

## 2015-04-06 MED ORDER — OXYCODONE-ACETAMINOPHEN 5-325 MG PO TABS: 1.0000 | ORAL_TABLET | ORAL | Status: DC | PRN

## 2015-04-06 MED ORDER — OXYCODONE HCL 5 MG PO TABS
5.0000 mg | ORAL_TABLET | ORAL | Status: DC | PRN
  Administered 2015-04-06 (×3): 10 mg via ORAL
  Administered 2015-04-07: 5 mg via ORAL
  Administered 2015-04-07: 10 mg via ORAL
  Filled 2015-04-06 (×5): qty 2

## 2015-04-06 MED ORDER — CIPROFLOXACIN HCL 500 MG PO TABS
500.0000 mg | ORAL_TABLET | Freq: Two times a day (BID) | ORAL | Status: DC
  Administered 2015-04-06 – 2015-04-07 (×3): 500 mg via ORAL
  Filled 2015-04-06 (×3): qty 1

## 2015-04-06 NOTE — Progress Notes (Signed)
Patient ID: Chrissi Crow, female   DOB: [birth year], 49 y.o.   MRN: [id number]    Subjective: Pt feels well.  Tolerating solid diet but wants to return to full liquids due to her gastric bypass.  Objective: Vital signs in last 24 hours: Temp:  [98.4 F (36.9 C)-100.1 F (37.8 C)] 100.1 F (37.8 C) (09/12 0512) Pulse Rate:  [92-97] 94 (09/12 0512) Resp:  [18] 18 (09/12 0512) BP: (107-116)/(62-77) 107/62 mmHg (09/12 0512) SpO2:  [97 %-100 %] 97 % (09/12 0512) Last BM Date: 04/01/15  Intake/Output from previous day: 09/11 0701 - 09/12 0700 In: 1384 [P.O.:784; I.V.:600] Out: 49 [Drains:70] Intake/Output this shift:    PE: Abd: soft, minimally tender, +BS, ND, Both drains with minimal serosang output. (70cc total yesterday) Heart: regular Lungs: CTAB  Lab Results:   Recent Labs  04/04/15 0401  WBC 7.3  HGB 7.4*  HCT 23.7*  PLT 268   BMET  Recent Labs  04/04/15 0401  NA 134*  K 3.4*  CL 104  CO2 21*  GLUCOSE 124*  BUN <5*  CREATININE 0.50  CALCIUM 8.5*   PT/INR No results for input(s): LABPROT, INR in the last 72 hours. CMP     Component Value Date/Time   NA 134* 04/04/2015 0401   K 3.4* 04/04/2015 0401   CL 104 04/04/2015 0401   CO2 21* 04/04/2015 0401   GLUCOSE 124* 04/04/2015 0401   BUN <5* 04/04/2015 0401   CREATININE 0.50 04/04/2015 0401   CALCIUM 8.5* 04/04/2015 0401   PROT 8.0 04/01/2015 2140   ALBUMIN 3.6 04/01/2015 2140   AST 20 04/01/2015 2140   ALT 16 04/01/2015 2140   ALKPHOS 107 04/01/2015 2140   BILITOT 0.8 04/01/2015 2140   GFRNONAA >60 04/04/2015 0401   GFRAA >60 04/04/2015 0401   Lipase     Component Value Date/Time   LIPASE 14* 04/01/2015 2140       Studies/Results: No results found.  Anti-infectives: Anti-infectives    Start     Dose/Rate Route Frequency Ordered Stop   04/03/15 0400  gentamicin (GARAMYCIN) 390 mg in dextrose 5 % 100 mL IVPB     7 mg/kg  55 kg (Adjusted) 109.8 mL/hr over 60 Minutes Intravenous  Daily 04/02/15 1424     04/02/15 1200  ciprofloxacin (CIPRO) IVPB 400 mg     400 mg 200 mL/hr over 60 Minutes Intravenous Every 12 hours 04/02/15 0331     04/02/15 1100  metroNIDAZOLE (FLAGYL) IVPB 500 mg     500 mg 100 mL/hr over 60 Minutes Intravenous 3 times per day 04/02/15 0331     04/02/15 0015  gentamicin (GARAMYCIN) 390 mg in dextrose 5 % 100 mL IVPB  Status:  Discontinued     7 mg/kg  55 kg (Adjusted) 109.8 mL/hr over 60 Minutes Intravenous Daily at bedtime 04/02/15 0011 04/02/15 1424   04/02/15 0000  ciprofloxacin (CIPRO) IVPB 400 mg     400 mg 200 mL/hr over 60 Minutes Intravenous  Once 04/01/15 2356 04/02/15 0148   04/02/15 0000  metroNIDAZOLE (FLAGYL) IVPB 500 mg     500 mg 100 mL/hr over 60 Minutes Intravenous  Once 04/01/15 2356 04/02/15 0314       Assessment/Plan  1. Acute perforated appendicitis with abscess -s/p perc drain x2 on 04-03-15 -on C/F/gent D 5, will stop these and change to oral C/F and see where her fever curve goes.  If it continues to fall, will hopefully DC home tomorrow.  If fever  curve spikes, may need to continue inpatient or send home on IV abx therapy. -decrease diet to full liquids as this is easier for her to tolerate due to her bypass. -cont both drains, she will need outpatient follow up in drain clinics with IR  LOS: 4 days    Marchell Froman E 04/06/2015, 8:54 AM Pager: [phone]

## 2015-04-07 ENCOUNTER — Other Ambulatory Visit: Payer: Self-pay | Admitting: Surgery

## 2015-04-07 ENCOUNTER — Other Ambulatory Visit (HOSPITAL_COMMUNITY): Payer: Self-pay | Admitting: Interventional Radiology

## 2015-04-07 DIAGNOSIS — K3533 Acute appendicitis with perforation and localized peritonitis, with abscess: Secondary | ICD-10-CM

## 2015-04-07 DIAGNOSIS — K3580 Unspecified acute appendicitis: Secondary | ICD-10-CM

## 2015-04-07 DIAGNOSIS — K3532 Acute appendicitis with perforation and localized peritonitis, without abscess: Secondary | ICD-10-CM

## 2015-04-07 MED ORDER — OXYCODONE HCL 5 MG PO TABS: 5.0000 mg | ORAL_TABLET | ORAL | Status: DC | PRN

## 2015-04-07 MED ORDER — CIPROFLOXACIN HCL 500 MG PO TABS: 500.0000 mg | ORAL_TABLET | Freq: Two times a day (BID) | ORAL | Status: DC

## 2015-04-07 MED ORDER — CLINDAMYCIN HCL 300 MG PO CAPS: 300.0000 mg | ORAL_CAPSULE | Freq: Three times a day (TID) | ORAL | Status: DC

## 2015-04-07 NOTE — Discharge Instructions (Signed)
Appendicitis Appendicitis is when the appendix is swollen (inflamed). The inflammation can lead to developing a hole (perforation) and a collection of pus (abscess). CAUSES  There is not always an obvious cause of appendicitis. Sometimes it is caused by an obstruction in the appendix. The obstruction can be caused by:  A small, hard, pea-sized ball of stool (fecalith).  Enlarged lymph glands in the appendix. SYMPTOMS   Pain around your belly button (navel) that moves toward your lower right belly (abdomen). The pain can become more severe and sharp as time passes.  Tenderness in the lower right abdomen. Pain gets worse if you cough or make a sudden movement.  Feeling sick to your stomach (nauseous).  Throwing up (vomiting).  Loss of appetite.  Fever.  Constipation.  Diarrhea.  Generally not feeling well. DIAGNOSIS   Physical exam.  Blood tests.  Urine test.  X-rays or a CT scan may confirm the diagnosis. TREATMENT  Once the diagnosis of appendicitis is made, the most common treatment is to remove the appendix as soon as possible. This procedure is called appendectomy. In an open appendectomy, a cut (incision) is made in the lower right abdomen and the appendix is removed. In a laparoscopic appendectomy, usually 3 small incisions are made. Long, thin instruments and a camera tube are used to remove the appendix. Most patients go home in 24 to 48 hours after appendectomy. In some situations, the appendix may have already perforated and an abscess may have formed. The abscess may have a "wall" around it as seen on a CT scan. In this case, a drain may be placed into the abscess to remove fluid, and you may be treated with antibiotic medicines that kill germs. The medicine is given through a tube in your vein (IV). Once the abscess has resolved, it may or may not be necessary to have an appendectomy. You may need to stay in the hospital longer than 48 hours. Document Released:  07/11/2005 Document Revised: 01/10/2012 Document Reviewed: 10/06/2009 [facility] Patient Information 2015 [place], [place]. This information is not intended to replace advice given to you by your health care provider. Make sure you discuss any questions you have with your health care provider.  Flush your drains with 10 CC of NS daily Remove gauze and tape prior to a shower, dry the area well after your shower and then replaced drain gauze and tape

## 2015-04-07 NOTE — Care Management Note (Signed)
Case Management Note  Patient Details  Name: Merry Pond MRN: [id number] Date of Birth: 12-31-1965  Subjective/Objective:                    Action/Plan:  Confirmed face sheet information with patient .  Expected Discharge Date:                  Expected Discharge Plan:  [place]  In-House Referral:     Discharge planning Services  CM Consult  Post Acute Care Choice:  Home Health Choice offered to:  Patient  DME Arranged:    DME Agency:     HH Arranged:  RN [place] Agency:  [facility]  Status of Service:  Completed, signed off  Medicare Important Message Given:    Date Medicare IM Given:    Medicare IM give by:    Date Additional Medicare IM Given:    Additional Medicare Important Message give by:     If discussed at [place] of Stay Meetings, dates discussed:    Additional Comments:  Marilu Favre, RN 04/07/2015, 10:18 AM

## 2015-04-07 NOTE — Progress Notes (Signed)
Patient discharged at 36 with volunteer services. Discharge instructions reviewed with patient and fiancee at the bedside. Pt verbalized understanding and no further questions at this time.  [name], [name]

## 2015-04-07 NOTE — Discharge Summary (Signed)
Patient ID: [name] MRN: [id number] DOB/AGE: 1966-02-11 49 y.o.  Admit date: 04/01/2015 Discharge date: 04/07/2015  Procedures: IR placement of percutaneous drains x2, Dr. Ronny Bacon 04-03-15  Consults: None  Reason for Admission:  42 yof with history of lap gastric bypass in [place] presents with just over 2d of worsening rlq pain. This has not gone away, nothing relieving it. Thought she was constipated/bloated and tried laxative. Has been having bms though. No vomiting. Some nausea. Febrile at home yesterday. Urinating. She has not ever had colonoscopy. She presented to er today and underwent ct scan that appears to show perf appy with abscess  Admission Diagnoses:  1. Acute perforated appendicitis with abscess  Hospital Course: The patient was admitted and IR was consulted.  Due to an emergency her drains were not able to be placed until HD 2.  The patient was kept NPO and started on Cipro/Flagyl/Gent on admission.  She was febrile initially, but after her two perc drains were placed, her fever curve improved.  Her WBC also normalized as well.  She was started on a clear liquid diet following placement of her drains.  Her diet was able to be advanced as tolerated.  By the day of discharge, her drains were only putting out in combination about 25cc of serous drainage.  HH will be arranged to teach her and her fiance how to flush her drains at home.  She will complete a 14 day course of abx therapy total.  Her cultures grew microaerophilic strep and E coli.  Therefore, she will be sent home on Clindamycin and Cipro.  She was stable on HD 6 for dc home.  PE: Abd: soft, minimally tender, drains with no output currently, +BS, ND Heart: regular LungS: CTAB  Discharge Diagnoses:  Active Problems:   Perforated appendicitis   Appendiceal abscess s/p perc drain x2  Discharge Medications:   Medication List    TAKE these medications        acetaminophen 500 MG tablet  Commonly  known as:  TYLENOL  Take 1,000 mg by mouth every 6 (six) hours as needed for fever.     ciprofloxacin 500 MG tablet  Commonly known as:  CIPRO  Take 1 tablet (500 mg total) by mouth 2 (two) times daily.     clindamycin 300 MG capsule  Commonly known as:  CLEOCIN  Take 1 capsule (300 mg total) by mouth every 8 (eight) hours.     cyclobenzaprine 10 MG tablet  Commonly known as:  FLEXERIL  Take 10 mg by mouth 3 (three) times daily as needed for muscle spasms.     escitalopram 10 MG tablet  Commonly known as:  LEXAPRO  Take 10 mg by mouth daily.     ferrous sulfate 325 (65 FE) MG tablet  Take 325 mg by mouth 2 (two) times daily with a meal.     oxyCODONE 5 MG immediate release tablet  Commonly known as:  Oxy IR/ROXICODONE  Take 1-2 tablets (5-10 mg total) by mouth every 4 (four) hours as needed for moderate pain.     traMADol 50 MG tablet  Commonly known as:  ULTRAM  Take 50 mg by mouth every 6 (six) hours as needed for moderate pain.     vitamin B-12 1000 MCG tablet  Commonly known as:  CYANOCOBALAMIN  Take 500 mcg by mouth daily.     vitamin C 500 MG tablet  Commonly known as:  ASCORBIC ACID  Take 500 mg by mouth  daily.        Discharge Instructions: Follow-up Information    Follow up with [facility], MD In 3 weeks.   Specialty:  General Surgery   Why:  our office will call you with an appointment time   Contact information:   [address] [place] [address] [phone]       Follow up with Sandi Mariscal, MD.   Specialty:  Interventional Radiology   Why:  Their office will call you with appointment time for their drain clinic   Contact information:   [address] [place] [address] [phone]       Signed: Henreitta Cea 04/07/2015, 8:54 AM

## 2015-04-16 ENCOUNTER — Ambulatory Visit
Admission: RE | Admit: 2015-04-16 | Discharge: 2015-04-16 | Disposition: A | Discharge status: Home / Self Care | Payer: Commercial Managed Care - HMO | Source: Ambulatory Visit | Attending: Surgery | Admitting: Surgery

## 2015-04-16 ENCOUNTER — Other Ambulatory Visit: Payer: Self-pay | Admitting: Surgery

## 2015-04-16 ENCOUNTER — Ambulatory Visit
Admission: RE | Admit: 2015-04-16 | Discharge: 2015-04-16 | Disposition: A | Discharge status: Home / Self Care | Payer: Commercial Managed Care - HMO | Source: Ambulatory Visit | Attending: Radiology | Admitting: Radiology

## 2015-04-16 ENCOUNTER — Other Ambulatory Visit: Payer: Self-pay | Admitting: Radiology

## 2015-04-16 ENCOUNTER — Other Ambulatory Visit (HOSPITAL_COMMUNITY): Payer: Self-pay | Admitting: Radiology

## 2015-04-16 ENCOUNTER — Other Ambulatory Visit: Payer: Self-pay | Admitting: Physician Assistant

## 2015-04-16 ENCOUNTER — Other Ambulatory Visit (HOSPITAL_COMMUNITY): Payer: Self-pay | Admitting: Interventional Radiology

## 2015-04-16 ENCOUNTER — Ambulatory Visit
Admission: RE | Admit: 2015-04-16 | Discharge: 2015-04-16 | Disposition: A | Discharge status: Home / Self Care | Payer: Commercial Managed Care - HMO | Source: Ambulatory Visit | Attending: Interventional Radiology | Admitting: Interventional Radiology

## 2015-04-16 DIAGNOSIS — K3532 Acute appendicitis with perforation and localized peritonitis, without abscess: Secondary | ICD-10-CM

## 2015-04-16 DIAGNOSIS — K3580 Unspecified acute appendicitis: Secondary | ICD-10-CM

## 2015-04-16 DIAGNOSIS — K3533 Acute appendicitis with perforation and localized peritonitis, with abscess: Secondary | ICD-10-CM

## 2015-04-16 MED ORDER — IOPAMIDOL (ISOVUE-300) INJECTION 61%
100.0000 mL | Freq: Once | INTRAVENOUS | Status: AC | PRN
  Administered 2015-04-16: 100 mL via INTRAVENOUS

## 2015-04-16 NOTE — H&P (Signed)
Referring Physician(s): Dr. Donne [name]   Chief Complaint: The patient is seen in follow up today s/p Acute perforated appendicitis with abscess S/p (2) 10 F percutaneous drains placed RLQ 04/03/15  History of present illness: [name] is a 49 year old female with recent acute perforated appendicitis with abscess s/p (2) 10 F percutaneous drains placed RLQ 04/03/15. The patient was discharged on 04/07/15 on Cipro and Clindamycin. She has finished cipro and is still taking Clindamycin without abdominal pain, fever or chills. She states her fiance has been flushing the drains with approximately 10-15 cc sterile saline every other day without difficulty. She is here today for follow up CT and drain management.   Past Medical History  Diagnosis Date  . Anemia     Past Surgical History  Procedure Laterality Date  . Abdominal hysterectomy    . Gastric bypass      Allergies: Cymbalta and Penicillins  Medications: Prior to Admission medications   Medication Sig Start Date End Date Taking? Authorizing Provider  acetaminophen (TYLENOL) 500 MG tablet Take 1,000 mg by mouth every 6 (six) hours as needed for fever.    Historical Provider, MD  ciprofloxacin (CIPRO) 500 MG tablet Take 1 tablet (500 mg total) by mouth 2 (two) times daily. 04/07/15   Saverio Danker, PA-C  clindamycin (CLEOCIN) 300 MG capsule Take 1 capsule (300 mg total) by mouth every 8 (eight) hours. 04/07/15   Saverio Danker, PA-C  cyclobenzaprine (FLEXERIL) 10 MG tablet Take 10 mg by mouth 3 (three) times daily as needed for muscle spasms.    Historical Provider, MD  escitalopram (LEXAPRO) 10 MG tablet Take 10 mg by mouth daily.    Historical Provider, MD  ferrous sulfate 325 (65 FE) MG tablet Take 325 mg by mouth 2 (two) times daily with a meal.    Historical Provider, MD  oxyCODONE (OXY IR/ROXICODONE) 5 MG immediate release tablet Take 1-2 tablets (5-10 mg total) by mouth every 4 (four) hours as needed for moderate pain.  04/07/15   Saverio Danker, PA-C  traMADol (ULTRAM) 50 MG tablet Take 50 mg by mouth every 6 (six) hours as needed for moderate pain.    Historical Provider, MD  vitamin B-12 (CYANOCOBALAMIN) 1000 MCG tablet Take 500 mcg by mouth daily.    Historical Provider, MD  vitamin C (ASCORBIC ACID) 500 MG tablet Take 500 mg by mouth daily.    Historical Provider, MD     No family history on file.  Social History   Social History  . Marital Status: Single    Spouse Name: N/A  . Number of Children: N/A  . Years of Education: N/A   Social History Main Topics  . Smoking status: Never Smoker   . Smokeless tobacco: Not on file  . Alcohol Use: Yes  . Drug Use: No  . Sexual Activity: Not on file   Other Topics Concern  . Not on file   Social History Narrative   Vital Signs: BP 110/71 mmHg  Pulse 81  Temp(Src) 98.5 F (36.9 C) (Oral)  SpO2 99%  Physical Exam General: A&ox3, NAD, ambulates well without difficulty. Heart: RRR Lungs: CTA b/l Abd: Soft, NT, ND, (+) BS, Upper RLQ perc drain dressing C/D/I, NT, 10 cc/24 hrs serous output, lower RLQ perc drain dressing C/D/I, NT, 5 cc/24 hrs serous output   Imaging: No results found.  Labs:  CBC:  Recent Labs  04/01/15 2140 04/02/15 0400 04/03/15 0425 04/04/15 0401  WBC 12.5* 10.9* 11.7*  7.3  HGB 9.2* 7.9* 7.8* 7.4*  HCT 30.2* 26.0* 25.0* 23.7*  PLT 284 253 247 268    COAGS:  Recent Labs  04/02/15 1110  INR 1.48  APTT 38*    BMP:  Recent Labs  04/01/15 2140 04/02/15 0400 04/03/15 0425 04/04/15 0401  NA 133* 135 133* 134*  K 3.8 3.3* 3.7 3.4*  CL 96* 105 102 104  CO2 24 22 22  21*  GLUCOSE 96 95 100* 124*  BUN 10 5* <5* <5*  CALCIUM 9.6 8.6* 8.7* 8.5*  CREATININE 0.58 0.51 0.51 0.50  GFRNONAA >60 >60 >60 >60  GFRAA >60 >60 >60 >60    LIVER FUNCTION TESTS:  Recent Labs  04/01/15 2140  BILITOT 0.8  AST 20  ALT 16  ALKPHOS 107  PROT 8.0  ALBUMIN 3.6    Assessment: Acute perforated appendicitis  with abscess S/p (2) 10 F percutaneous drains placed RLQ 04/03/15, patient discharged 04/07/15 on antibiotics, afebrile, flushing drains every other day with minimal output from lower drain and good output from upper drain, Cx E. Coli  Follow up CT today revealed resolved abscess surrounding upper RLQ drain-however still with 10cc/24 hrs output will keep drain in place.  Lower RLQ abscess not resolved and output down 5cc/24 hrs- Dr. Pascal Lux has discussed with Dr. Donne [name] and no plan for appendectomy in next few weeks therefore will schedule the patient for lower RLQ drain exchange/upsize with sedation tomorrow.  Patient given instructions for tomorrow's procedure and she will F/U with Dr. Donne [name] as scheduled on 04/23/15 as well as in drain clinic in approximately 2 weeks with repeat CT.   SignedHedy Jacob 04/16/2015, 10:38 AM   Please refer to Dr. Pascal Lux attestation of this note for management and plan.

## 2015-04-17 ENCOUNTER — Ambulatory Visit (HOSPITAL_COMMUNITY)
Admission: RE | Admit: 2015-04-17 | Discharge: 2015-04-17 | Disposition: A | Discharge status: Home / Self Care | Payer: Commercial Managed Care - HMO | Source: Ambulatory Visit | Attending: Physician Assistant | Admitting: Physician Assistant

## 2015-04-17 ENCOUNTER — Ambulatory Visit (HOSPITAL_COMMUNITY)
Admission: RE | Admit: 2015-04-17 | Discharge: 2015-04-17 | Disposition: A | Discharge status: Home / Self Care | Payer: Commercial Managed Care - HMO | Source: Ambulatory Visit | Attending: Interventional Radiology | Admitting: Interventional Radiology

## 2015-04-17 ENCOUNTER — Encounter (HOSPITAL_COMMUNITY): Payer: Self-pay

## 2015-04-17 DIAGNOSIS — K353 Acute appendicitis with localized peritonitis: Secondary | ICD-10-CM | POA: Diagnosis present

## 2015-04-17 DIAGNOSIS — Z9889 Other specified postprocedural states: Secondary | ICD-10-CM | POA: Diagnosis not present

## 2015-04-17 DIAGNOSIS — K3532 Acute appendicitis with perforation and localized peritonitis, without abscess: Secondary | ICD-10-CM

## 2015-04-17 DIAGNOSIS — Z880 Allergy status to penicillin: Secondary | ICD-10-CM | POA: Insufficient documentation

## 2015-04-17 HISTORY — DX: Depression, unspecified: F32.A

## 2015-04-17 HISTORY — DX: Major depressive disorder, single episode, unspecified: F32.9

## 2015-04-17 LAB — CBC WITH DIFFERENTIAL/PLATELET
Basophils Absolute: 0.1 10*3/uL (ref 0.0–0.1)
Basophils Relative: 1 %
Eosinophils Absolute: 0.1 10*3/uL (ref 0.0–0.7)
Eosinophils Relative: 2 %
HCT: 28.6 % — ABNORMAL LOW (ref 36.0–46.0)
Hemoglobin: 8.7 g/dL — ABNORMAL LOW (ref 12.0–15.0)
Lymphocytes Relative: 34 %
Lymphs Abs: 2 10*3/uL (ref 0.7–4.0)
MCH: 22.5 pg — ABNORMAL LOW (ref 26.0–34.0)
MCHC: 30.4 g/dL (ref 30.0–36.0)
MCV: 74.1 fL — ABNORMAL LOW (ref 78.0–100.0)
Monocytes Absolute: 0.4 10*3/uL (ref 0.1–1.0)
Monocytes Relative: 7 %
Neutro Abs: 3.2 10*3/uL (ref 1.7–7.7)
Neutrophils Relative %: 56 %
Platelets: 439 10*3/uL — ABNORMAL HIGH (ref 150–400)
RBC: 3.86 MIL/uL — ABNORMAL LOW (ref 3.87–5.11)
RDW: 17.3 % — ABNORMAL HIGH (ref 11.5–15.5)
WBC: 5.8 10*3/uL (ref 4.0–10.5)

## 2015-04-17 LAB — BASIC METABOLIC PANEL
Anion gap: 8 (ref 5–15)
BUN: 13 mg/dL (ref 6–20)
CO2: 27 mmol/L (ref 22–32)
Calcium: 9.8 mg/dL (ref 8.9–10.3)
Chloride: 101 mmol/L (ref 101–111)
Creatinine, Ser: 0.6 mg/dL (ref 0.44–1.00)
GFR calc Af Amer: >60 mL/min (ref >60)
GFR calc non Af Amer: >60 mL/min (ref >60)
Glucose, Bld: 96 mg/dL (ref 65–99)
Potassium: 4.3 mmol/L (ref 3.5–5.1)
Sodium: 136 mmol/L (ref 135–145)

## 2015-04-17 MED ORDER — FENTANYL CITRATE (PF) 100 MCG/2ML IJ SOLN
INTRAMUSCULAR | Status: AC | PRN
  Administered 2015-04-17: 25 ug via INTRAVENOUS
  Administered 2015-04-17: 50 ug via INTRAVENOUS

## 2015-04-17 MED ORDER — MIDAZOLAM HCL 2 MG/2ML IJ SOLN
INTRAMUSCULAR | Status: AC
  Filled 2015-04-17: qty 4

## 2015-04-17 MED ORDER — LIDOCAINE-EPINEPHRINE 2 %-1:100000 IJ SOLN
INTRAMUSCULAR | Status: AC
  Filled 2015-04-17: qty 1

## 2015-04-17 MED ORDER — SODIUM CHLORIDE 0.9 % IV SOLN
INTRAVENOUS | Status: DC
  Administered 2015-04-17: 500 mL via INTRAVENOUS

## 2015-04-17 MED ORDER — FENTANYL CITRATE (PF) 100 MCG/2ML IJ SOLN
INTRAMUSCULAR | Status: AC
  Filled 2015-04-17: qty 2

## 2015-04-17 MED ORDER — MIDAZOLAM HCL 2 MG/2ML IJ SOLN
INTRAMUSCULAR | Status: AC | PRN
  Administered 2015-04-17: 0.5 mg via INTRAVENOUS
  Administered 2015-04-17: 1 mg via INTRAVENOUS

## 2015-04-17 MED ORDER — IOHEXOL 300 MG/ML  SOLN
20.0000 mL | Freq: Once | INTRAMUSCULAR | Status: AC | PRN
  Administered 2015-04-17: 15 mL

## 2015-04-17 NOTE — Discharge Instructions (Signed)
Percutaneous Abscess Drain, Care After  Refer to this sheet in the next few weeks. These instructions provide you with information on caring for yourself after your procedure. Your health care provider may also give you more specific instructions. Your treatment has been planned according to current medical practices, but problems sometimes occur. Call your health care provider if you have any problems or questions after your procedure. WHAT TO EXPECT AFTER THE PROCEDURE After your procedure, it is typical to have the following:   A small amount of discomfort in the area where the drainage tube was placed.  A small amount of bruising around the area where the drainage tube was placed.  Sleepiness and fatigue for the rest of the day from the medicines used. HOME CARE INSTRUCTIONS  Rest at home for 1-2 days following your procedure or as directed by your health care provider.  If you go home right after the procedure, plan to have someone with you for 24 hours.  Do not take a bathor shower for 24 hours after your procedure.  Take medicines only as directed by your health care provider. Ask your health care provider when you can resume taking any normal medicines.  Change bandages (dressings) as directed.   You may be told to record the amount of drainage from the bag every time you empty it. Follow your health care provider's directions for emptying the bag. Write down the amount of drainage, the date, and the time you emptied it.  Call your health care provider when the drain is putting out less than 10 mL of drainage per day for 2-3 days in a row or as directed by your health care provider.  Follow your health care provider's instructions for cleaning the drainage tube. You may need to clean the tube every day so that it does not clog. SEEK MEDICAL CARE IF:  You have increased bleeding (more than a small spot) from the site where the drainage tube was placed.  You have redness,  swelling, or increasing pain around the site where the drainage tube was placed.  You notice a discharge or bad smell coming from the site where the drainage tube was placed.  You have a fever or chills.  You have pain that is not helped by medicine.  SEEK IMMEDIATE MEDICAL CARE IF:  There is leakage around the drainage tube.  The drainage tube pulls out.  You suddenly stop having drainage from the tube.  You suddenly have blood in the drainage fluid.  You become dizzy or faint.  You develop a rash.   You have nausea or vomiting.  You have difficulty breathing, feel short of breath, or feel faint.   You develop chest pain.  You have problems with your speech or vision.  You have trouble balancing or moving your arms or legs. Document Released: 11/25/2013 Document Reviewed: 08/30/2013 [facility] Patient Information 2015 [place]. This information is not intended to replace advice given to you by your health care provider. Make sure you discuss any questions you have with your health care provider. Percutaneous Abscess Drain An abscess is a collection of infected fluid inside the body. Your health care provider may decide to remove or drain the infected fluid from the area by placing a thin needle into the abscess. Usually, a small tube is left in place to drain the abscess fluid. The abscess fluid may take a few days to drain. LET [facility] CARE PROVIDER KNOW ABOUT:  Any allergies you have.  All medicines you are taking, including vitamins, herbs, eye drops, creams, and over-the-counter medicines. This includes steroid medicines by mouth or cream.  Previous problems you or members of your family have had with the use of anesthetics.  Any blood disorders you have.  Previous surgeries you have had.  Possibility of pregnancy, if this applies.  Medical conditions you have.  Any history of smoking. RISKS AND COMPLICATIONS Generally, this is a safe procedure.  However, problems can occur and include:   Infection.  Allergic reaction to materials used (such as contrast dye).  Damage to a nearby organ or tissue.  Bleeding.  Blockage of a tube placed to drain the abscess, requiring placement of a new drainage tube.  A need to repeat the procedure.  Failure of the procedure to adequately drain the abscess, requiring an open surgical procedure to do so. BEFORE THE PROCEDURE   Ask your health care provider about:  Changing or stopping your regular medicines. This is especially important if you are taking diabetes medicines or blood thinners.  Taking medicines such as aspirin and ibuprofen. These medicines can thin your blood. Do not take these medicines before your procedure if your health care provider asks you not to.  Your health care provider may do some blood or urine tests. These will help your health care provider learn how well your kidneys and liver are working and how well your blood clots.  Do not eat or drink anything after midnight on the night before the procedure or as directed by your health care provider.  Make arrangements for someone to drive you home after the procedure.  PROCEDURE   An IV tube will be placed in your arm. Medicine will be able to flow directly into your body through this tube.  You will lie on an X-ray table.  Your heart rate, blood pressure, and breathing will be monitored.   Your oxygen level will also be watched during the procedure. Supplemental oxygen may be given if necessary.  The skin around the area where the drainage tube (catheter) will be placed will be cleaned and numbed.  A small cut (incision) will then be made to insert the drainage tube. The drainage tube will be inserted using X-ray or CT scan to help direct where it should be placed.  The drainage tube will be guided into the abscess to drain the infected fluid.  The drainage tube may stay in place and be connected to a bag  outside your body. It will stay until the fluid has stopped draining and the infection is gone. AFTER THE PROCEDURE  You will be taken to a recovery area where you will stay until the medicines have worn off.  You will stay in bed for several hours.  Your progress will be monitored.   Your blood pressure and pulse will be checked often.   The area of the incision will be checked often.  You may have some pain or feel sick. Tell your health care provider.  As you begin to feel better, you may be given ice, fluids, and food.   When you can walk, drink, eat, and use the bathroom, you may be able to go home. Document Released: 11/25/2013 Document Reviewed: 08/30/2013 [facility] Patient Information 2015 [place]. This information is not intended to replace advice given to you by your health care provider. Make sure you discuss any questions you have with your health care provider. Conscious Sedation Sedation is the use of medicines to promote  relaxation and relieve discomfort and anxiety. Conscious sedation is a type of sedation. Under conscious sedation you are less alert than normal but are still able to respond to instructions or stimulation. Conscious sedation is used during short medical and dental procedures. It is milder than deep sedation or general anesthesia and allows you to return to your regular activities sooner.  LET [facility] CARE PROVIDER KNOW ABOUT:   Any allergies you have.  All medicines you are taking, including vitamins, herbs, eye drops, creams, and over-the-counter medicines.  Use of steroids (by mouth or creams).  Previous problems you or members of your family have had with the use of anesthetics.  Any blood disorders you have.  Previous surgeries you have had.  Medical conditions you have.  Possibility of pregnancy, if this applies.  Use of cigarettes, alcohol, or illegal drugs. RISKS AND COMPLICATIONS Generally, this is a safe procedure.  However, as with any procedure, problems can occur. Possible problems include:  Oversedation.  Trouble breathing on your own. You may need to have a breathing tube until you are awake and breathing on your own.  Allergic reaction to any of the medicines used for the procedure. BEFORE THE PROCEDURE  You may have blood tests done. These tests can help show how well your kidneys and liver are working. They can also show how well your blood clots.  A physical exam will be done.  Only take medicines as directed by your health care provider. You may need to stop taking medicines (such as blood thinners, aspirin, or nonsteroidal anti-inflammatory drugs) before the procedure.   Do not eat or drink at least 6 hours before the procedure or as directed by your health care provider.  Arrange for a responsible adult, family member, or friend to take you home after the procedure. He or she should stay with you for at least 24 hours after the procedure, until the medicine has worn off. PROCEDURE   An intravenous (IV) catheter will be inserted into one of your veins. Medicine will be able to flow directly into your body through this catheter. You may be given medicine through this tube to help prevent pain and help you relax.  The medical or dental procedure will be done. AFTER THE PROCEDURE  You will stay in a recovery area until the medicine has worn off. Your blood pressure and pulse will be checked.   Depending on the procedure you had, you may be allowed to go home when you can tolerate liquids and your pain is under control. Document Released: 04/05/2001 Document Revised: 07/16/2013 Document Reviewed: 03/18/2013 [facility] Patient Information 2015 [place], [place]. This information is not intended to replace advice given to you by your health care provider. Make sure you discuss any questions you have with your health care provider.  Flush each drain with 10 cc of Normal Saline twice a day.  Call Dr Cristal Generous office to obtain prescription for normal saline to be called in to your pharmacy. Record the amount of drainage from each tube. (JP and leg bag).

## 2015-04-17 NOTE — Procedures (Signed)
Technically successful fluoroscopic guided manipulation and upsizing to a new 12 Fr drainage catheter with tip positioned with known residual peri-appendiceal abscess.  No immediate post procedural complications. Pt to return to IR drain clinic in 2 weeks for repeat CT of abdomen and pelvis with IV contrast and drain management.   Ronny Bacon, MD Pager #: [phone]

## 2015-04-17 NOTE — Progress Notes (Signed)
Patient ID: [name], female   DOB: Sep 28, 1965, 49 y.o.   MRN: [id number]    Referring Physician(s): Wakefield,M  Chief Complaint: Appendiceal abscess   Subjective:  Pt familiar to IR service from prior drainage X 2 of RLQ abdominal/appendiceal abscess on 04/03/15 (cx's- e coli/strept). She was seen at IR clinic in f/u on 9/22 with CT revealing smaller but persistent abscess . She presents today for fluoroscopic guided drainage catheter upsizing/possible repositioning to expedite output. She denies recent fevers, chills, HA, CP dyspnea, cough, N/V or abnormal bleeding. She does have some intermittent RLQ to rt flank discomfort.   Allergies: Cymbalta and Penicillins  Medications: Prior to Admission medications   Medication Sig Start Date End Date Taking? Authorizing Provider  acetaminophen (TYLENOL) 500 MG tablet Take 1,000 mg by mouth every 6 (six) hours as needed for fever.    Historical Provider, MD  ciprofloxacin (CIPRO) 500 MG tablet Take 1 tablet (500 mg total) by mouth 2 (two) times daily. 04/07/15   Saverio Danker, PA-C  clindamycin (CLEOCIN) 300 MG capsule Take 1 capsule (300 mg total) by mouth every 8 (eight) hours. 04/07/15   Saverio Danker, PA-C  cyclobenzaprine (FLEXERIL) 10 MG tablet Take 10 mg by mouth 3 (three) times daily as needed for muscle spasms.    Historical Provider, MD  escitalopram (LEXAPRO) 10 MG tablet Take 10 mg by mouth daily.    Historical Provider, MD  ferrous sulfate 325 (65 FE) MG tablet Take 325 mg by mouth 2 (two) times daily with a meal.    Historical Provider, MD  oxyCODONE (OXY IR/ROXICODONE) 5 MG immediate release tablet Take 1-2 tablets (5-10 mg total) by mouth every 4 (four) hours as needed for moderate pain. 04/07/15   Saverio Danker, PA-C  traMADol (ULTRAM) 50 MG tablet Take 50 mg by mouth every 6 (six) hours as needed for moderate pain.    Historical Provider, MD  vitamin B-12 (CYANOCOBALAMIN) 1000 MCG tablet Take 500 mcg by mouth daily.     Historical Provider, MD  vitamin C (ASCORBIC ACID) 500 MG tablet Take 500 mg by mouth daily.    Historical Provider, MD     Vital Signs:BP 118/87  TEMP 99.1  HR 93  R 19  O2 SATS 98% RA  Physical Exam  Constitutional: She is oriented to person, place, and time. She appears well-developed and well-nourished.  Cardiovascular: Normal rate and regular rhythm.   Pulmonary/Chest: Effort normal and breath sounds normal.  Abdominal: Soft. Bowel sounds are normal.  Intact RLQ drains, minimal amt yellow/beige colored fluid in bags; insertion sites ok, mildly tender  Musculoskeletal: Normal range of motion. She exhibits no edema.  Neurological: She is alert and oriented to person, place, and time.      Imaging: Ct Abdomen Pelvis W Contrast  04/16/2015   CLINICAL DATA:  History of appendiceal abscess, post CT-guided percutaneous drainage catheter placement on 04/03/2015.  Patient returns today to the interventional radiology clinic for CT the abdomen pelvis as well as percutaneous drainage catheter management.  The patient reports approximately 10 cc per day of output from the more cranially located percutaneous drainage catheter an approximately 5 cc per day from the more caudally located percutaneous drainage catheter. The patient continues to flush both percutaneous drainage catheters every other day.  The patient denies fever or chills. Tolerating p.o. without incident. No flank pain.  EXAM: CT ABDOMEN AND PELVIS WITH CONTRAST  TECHNIQUE: Multidetector CT imaging of the abdomen and pelvis was performed using the standard  protocol following bolus administration of intravenous contrast.  CONTRAST:  121mL ISOVUE-300 IOPAMIDOL (ISOVUE-300) INJECTION 61%  COMPARISON:  CT abdomen pelvis - 04/11/2015; CT-guided percutaneous drainage catheter placement - 04/03/2015  FINDINGS: Stable positioning of right lower quadrant percutaneous abscess drainage catheters.  There is a tiny serpiginous peripherally enhancing  fluid collection about the cranial aspect of the more cranially located percutaneous drainage catheter which measures approximately 3.2 x 0.5 cm (coronal image 62, series 601) with fluid tracking along the anterior aspect of the right ileo psoas musculature (image 41, series 3).  There is a residual approximately 5.3 x 2.5 cm peripherally enhancing abscess about the cranial aspect of the more caudally located percutaneous drainage catheter (representative coronal image 52, series 601), previously, approximately 6.7 x 4.6 cm.  No new definable/drainable intra-abdominal fluid collections.  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 Moderate to large colonic stool burden without evidence of enteric obstruction. No pneumoperitoneum, pneumatosis or portal venous gas.  Post hysterectomy. Normal appearance of the urinary bladder given degree distention. No free fluid in the pelvic cul-de-sac.  Normal hepatic contour. No discrete hepatic lesions. Normal appearance of the gallbladder. No radiopaque soft veins. No intra extrahepatic biliary duct dilatation. No ascites.  There is symmetric enhancement of the bilateral kidneys. Interval development of mild right-sided pelvicaliectasis the IUD of which is not definitively depicted on the present examination though presumably secondary to the inflammatory change within the right lower abdominal quadrant. No evidence of left-sided urinary obstruction. No definite renal stones this postcontrast examination. No discrete renal lesions.  Scattered minimal amount of mixed calcified and noncalcified atherosclerotic plaque within a normal caliber abdominal aorta. The major branch vessels of the abdominal aorta appear widely patent on this non CTA examination. Scattered retroperitoneal lymph nodes are numerous though individually not enlarged by size criteria. No bulky retroperitoneal, mesenteric, pelvic or inguinal lymphadenopathy.  Limited visualization of the lower thorax  demonstrates a approximately 0.5 cm nodule within the right (image 2, series 4) and left (image 7) lower lobes. There is minimal subsegmental atelectasis within the left costophrenic angle. No focal airspace opacities. No pleural effusion.  Normal heart size.  No pericardial effusion.  No acute or aggressive osseous abnormalities.  Tiny mesenteric fat containing periumbilical hernia. Small (approximately 2.5 x 1.8 cm) mesenteric fat containing hernia about the caudal aspect of the midline rectus sheath.  IMPRESSION: 1. Post appendiceal abscess drainage catheter placements with interval reduction in size of dominant periappendiceal abscess, currently measuring 5.3 cm, previously, 6.7 cm with tiny (approximately 3.2 cm) serpiginous fluid collection about the cranial aspect of the more cranially located percutaneous drainage catheter extending along the anterior aspect the right iliopsoas musculature. 2. No new definable/drainable intra-abdominal fluid collections. 3. Interval development of mild right-sided pelvicaliectasis presumably secondary to the inflammatory change within the right lower abdominal quadrant. PLAN:  Although there has been decrease in size of the fluid collection about the more cranially located percutaneous drainage catheter, there has been greater output from this drain (approximately 10 cc per day) and as such, the decision was made to leave this percutaneous drainage catheter in place.  Given the persistent sizable periappendiceal abscess, the decision was made to perform a fluoroscopic guided percutaneous drainage catheter repositioning and up sizing which will be performed tomorrow at [facility] with conscious sedation.  This was discussed and agreed upon by the referring physician, Dr. Donne [name].   Electronically Signed   By: Sandi Mariscal M.D.   On: 04/16/2015 11:07  Labs:  CBC:  Recent Labs  04/01/15 2140 04/02/15 0400 04/03/15 0425 04/04/15 0401  WBC 12.5* 10.9*  11.7* 7.3  HGB 9.2* 7.9* 7.8* 7.4*  HCT 30.2* 26.0* 25.0* 23.7*  PLT 284 253 247 268    COAGS:  Recent Labs  04/02/15 1110  INR 1.48  APTT 38*    BMP:  Recent Labs  04/01/15 2140 04/02/15 0400 04/03/15 0425 04/04/15 0401  NA 133* 135 133* 134*  K 3.8 3.3* 3.7 3.4*  CL 96* 105 102 104  CO2 24 22 22  21*  GLUCOSE 96 95 100* 124*  BUN 10 5* <5* <5*  CALCIUM 9.6 8.6* 8.7* 8.5*  CREATININE 0.58 0.51 0.51 0.50  GFRNONAA >60 >60 >60 >60  GFRAA >60 >60 >60 >60    LIVER FUNCTION TESTS:  Recent Labs  04/01/15 2140  BILITOT 0.8  AST 20  ALT 16  ALKPHOS 107  PROT 8.0  ALBUMIN 3.6    Assessment and Plan: Pt with hx ruptured appendicitis with associated abscess formation and drainage cath placement x2 on 04/03/15; recent f/u CT reveals smaller but persistent abscess ; plan today is for fluoroscopic guided drainage catheter upsizing/possible repositioning to expedite output. Details/risks of procedure , including but not limited to, internal bleeding, infection/sepsis, organ injury d/w pt/family with their understanding and consent.   Signed: D. Rowe Robert 04/17/2015, 1:54 PM   I spent a total of 15 minutes at the the patient's bedside AND on the patient's hospital floor or unit, greater than 50% of which was counseling/coordinating care for fluoroscopic guided RLQ drain upsizing/possible manipulation

## 2015-04-20 ENCOUNTER — Encounter (HOSPITAL_COMMUNITY): Payer: Self-pay

## 2015-04-29 ENCOUNTER — Ambulatory Visit
Admission: RE | Admit: 2015-04-29 | Discharge: 2015-04-29 | Disposition: A | Discharge status: Home / Self Care | Payer: Commercial Managed Care - HMO | Source: Ambulatory Visit | Attending: Interventional Radiology | Admitting: Interventional Radiology

## 2015-04-29 ENCOUNTER — Ambulatory Visit
Admission: RE | Admit: 2015-04-29 | Discharge: 2015-04-29 | Disposition: A | Discharge status: Home / Self Care | Payer: Commercial Managed Care - HMO | Source: Ambulatory Visit | Attending: Radiology | Admitting: Radiology

## 2015-04-29 ENCOUNTER — Other Ambulatory Visit: Payer: Self-pay | Admitting: Surgery

## 2015-04-29 ENCOUNTER — Other Ambulatory Visit (HOSPITAL_COMMUNITY): Payer: Self-pay | Admitting: Interventional Radiology

## 2015-04-29 DIAGNOSIS — K3580 Unspecified acute appendicitis: Secondary | ICD-10-CM

## 2015-04-29 DIAGNOSIS — K3532 Acute appendicitis with perforation and localized peritonitis, without abscess: Secondary | ICD-10-CM

## 2015-04-29 DIAGNOSIS — K3533 Acute appendicitis with perforation and localized peritonitis, with abscess: Secondary | ICD-10-CM

## 2015-04-29 MED ORDER — IOPAMIDOL (ISOVUE-300) INJECTION 61%
100.0000 mL | Freq: Once | INTRAVENOUS | Status: AC | PRN
  Administered 2015-04-29: 100 mL via INTRAVENOUS

## 2015-04-29 NOTE — Progress Notes (Signed)
Chief Complaint: Status post percutaneous drainage of appendiceal abscess on 04/03/2015.  History of Present Illness: [name] is a 49 y.o. female status post placement of 2 separate percutaneous drainage catheters on 04/03/2015 to treat a complex appendiceal abscess after ruptured appendicitis. The more inferior of the 2 drains was exchanged and upsized by Dr. Pascal Lux on 04/17/2015 to treat residual abscess.  The more superior percutaneous drain has had no output for the last 2 weeks. The inferior percutaneous drain has had decreasing output with now approximately 5-10 mL of fluid output every 24 hours subtracting saline flushes of 10 mL twice daily. The patient has completed antibiotic therapy.  Past Medical History  Diagnosis Date  . Anemia   . Depression     Past Surgical History  Procedure Laterality Date  . Abdominal hysterectomy    . Gastric bypass       Allergies: Cymbalta and Penicillins  Medications: Prior to Admission medications   Medication Sig Start Date End Date Taking? Authorizing Provider  acetaminophen (TYLENOL) 500 MG tablet Take 1,000 mg by mouth every 6 (six) hours as needed for fever.    Historical Provider, MD  ciprofloxacin (CIPRO) 500 MG tablet Take 1 tablet (500 mg total) by mouth 2 (two) times daily. 04/07/15   Saverio Danker, PA-C  clindamycin (CLEOCIN) 300 MG capsule Take 1 capsule (300 mg total) by mouth every 8 (eight) hours. 04/07/15   Saverio Danker, PA-C  cyclobenzaprine (FLEXERIL) 10 MG tablet Take 10 mg by mouth 3 (three) times daily as needed for muscle spasms.    Historical Provider, MD  escitalopram (LEXAPRO) 10 MG tablet Take 10 mg by mouth daily.    Historical Provider, MD  ferrous sulfate 325 (65 FE) MG tablet Take 325 mg by mouth 2 (two) times daily with a meal.    Historical Provider, MD  oxyCODONE (OXY IR/ROXICODONE) 5 MG immediate release tablet Take 1-2 tablets (5-10 mg total) by mouth every 4 (four) hours as needed for moderate pain.  04/07/15   Saverio Danker, PA-C  traMADol (ULTRAM) 50 MG tablet Take 50 mg by mouth every 6 (six) hours as needed for moderate pain.    Historical Provider, MD  vitamin B-12 (CYANOCOBALAMIN) 1000 MCG tablet Take 500 mcg by mouth daily.    Historical Provider, MD  vitamin C (ASCORBIC ACID) 500 MG tablet Take 500 mg by mouth daily.    Historical Provider, MD     No family history on file.  Social History   Social History  . Marital Status: Single    Spouse Name: N/A  . Number of Children: N/A  . Years of Education: N/A   Social History Main Topics  . Smoking status: Never Smoker   . Smokeless tobacco: Not on file  . Alcohol Use: Yes  . Drug Use: No  . Sexual Activity: Not on file   Other Topics Concern  . Not on file   Social History Narrative    Review of Systems: A 12 point ROS discussed and pertinent positives are indicated in the HPI above.  All other systems are negative.  Review of Systems  Constitutional: Negative.   Respiratory: Negative.   Cardiovascular: Negative.   Gastrointestinal: Negative for nausea, vomiting, diarrhea, constipation, blood in stool, abdominal distention and anal bleeding.       Mild discomfort in right lower quadrant at drain sites.  Genitourinary: Negative.     Vital Signs: BP 120/66 mmHg  Pulse 72  Temp(Src) 99.8 F (37.7  C) (Oral)  SpO2 100%  Physical Exam  Abdominal: Soft. Bowel sounds are normal. She exhibits no distension. There is no rebound and no guarding.  Mild tenderness at drain exit sites.  Nursing note and vitals reviewed.   Imaging: Ct Abdomen Pelvis W Contrast  04/29/2015   CLINICAL DATA:  Ruptured appendicitis and status post percutaneous drainage of appendiceal abscess on 04/03/2015. Two separate drainage catheters were placed at that time to treat the complex abscess. The more inferior of the 2 drains was exchanged and upsized on 04/17/2015 to treat residual abscess.  EXAM: CT ABDOMEN AND PELVIS WITH CONTRAST   TECHNIQUE: Multidetector CT imaging of the abdomen and pelvis was performed using the standard protocol following bolus administration of intravenous contrast.  CONTRAST:  142mL ISOVUE-300 IOPAMIDOL (ISOVUE-300) INJECTION 61%  COMPARISON:  04/16/2015  FINDINGS: The superior drainage catheter terminating medial to the lower right kidney and adjacent to the right psoas muscle shows stable positioning with no evidence of residual abscess.  A small amount of fluid remains adjacent to the distal aspect of the more inferior percutaneous drainage catheter. This fluid collection shows decrease in size since prior imaging with fluid component now measuring approximately 1.7 x 2.7 cm compared to roughly 5 cm in maximum diameter on the prior study.  No new abscess is identified. There is no evidence of bowel obstruction. The liver, gallbladder, pancreas, spleen, adrenal glands and kidneys remain normal. Stable appearance of the stomach status post prior gastric bypass. No ascites. Stable small midline ventral hernia containing fat. The bladder is decompressed.  IMPRESSION: 1. Resolution of right-sided retroperitoneal abscess situated between the lower right kidney and right psoas muscle. This drainage catheter was removed following the CT study. 2. Significant decrease in size of residual appendiceal abscess adjacent to the recently upsized and exchanged percutaneous drain located more inferiorly in the right lower quadrant. Due to small amount of residual fluid remaining, the patient was instructed to decrease flushes to once daily rather than twice daily and continue to monitor output via a suction bulb. Current output is only approximately 5-10 mL per day. 3. See additional IR Drain Clinic Note dictated today in [place].   Electronically Signed   By: Aletta Edouard M.D.   On: 04/29/2015 14:18   Ct Abdomen Pelvis W Contrast  04/16/2015   CLINICAL DATA:  History of appendiceal abscess, post CT-guided percutaneous drainage  catheter placement on 04/03/2015.  Patient returns today to the interventional radiology clinic for CT the abdomen pelvis as well as percutaneous drainage catheter management.  The patient reports approximately 10 cc per day of output from the more cranially located percutaneous drainage catheter an approximately 5 cc per day from the more caudally located percutaneous drainage catheter. The patient continues to flush both percutaneous drainage catheters every other day.  The patient denies fever or chills. Tolerating p.o. without incident. No flank pain.  EXAM: CT ABDOMEN AND PELVIS WITH CONTRAST  TECHNIQUE: Multidetector CT imaging of the abdomen and pelvis was performed using the standard protocol following bolus administration of intravenous contrast.  CONTRAST:  152mL ISOVUE-300 IOPAMIDOL (ISOVUE-300) INJECTION 61%  COMPARISON:  CT abdomen pelvis - 04/11/2015; CT-guided percutaneous drainage catheter placement - 04/03/2015  FINDINGS: Stable positioning of right lower quadrant percutaneous abscess drainage catheters.  There is a tiny serpiginous peripherally enhancing fluid collection about the cranial aspect of the more cranially located percutaneous drainage catheter which measures approximately 3.2 x 0.5 cm (coronal image 62, series 601) with fluid tracking along the  anterior aspect of the right ileo psoas musculature (image 41, series 3).  There is a residual approximately 5.3 x 2.5 cm peripherally enhancing abscess about the cranial aspect of the more caudally located percutaneous drainage catheter (representative coronal image 52, series 601), previously, approximately 6.7 x 4.6 cm.  No new definable/drainable intra-abdominal fluid collections.  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 Moderate to large colonic stool burden without evidence of enteric obstruction. No pneumoperitoneum, pneumatosis or portal venous gas.  Post hysterectomy. Normal appearance of the urinary bladder given degree  distention. No free fluid in the pelvic cul-de-sac.  Normal hepatic contour. No discrete hepatic lesions. Normal appearance of the gallbladder. No radiopaque soft veins. No intra extrahepatic biliary duct dilatation. No ascites.  There is symmetric enhancement of the bilateral kidneys. Interval development of mild right-sided pelvicaliectasis the IUD of which is not definitively depicted on the present examination though presumably secondary to the inflammatory change within the right lower abdominal quadrant. No evidence of left-sided urinary obstruction. No definite renal stones this postcontrast examination. No discrete renal lesions.  Scattered minimal amount of mixed calcified and noncalcified atherosclerotic plaque within a normal caliber abdominal aorta. The major branch vessels of the abdominal aorta appear widely patent on this non CTA examination. Scattered retroperitoneal lymph nodes are numerous though individually not enlarged by size criteria. No bulky retroperitoneal, mesenteric, pelvic or inguinal lymphadenopathy.  Limited visualization of the lower thorax demonstrates a approximately 0.5 cm nodule within the right (image 2, series 4) and left (image 7) lower lobes. There is minimal subsegmental atelectasis within the left costophrenic angle. No focal airspace opacities. No pleural effusion.  Normal heart size.  No pericardial effusion.  No acute or aggressive osseous abnormalities.  Tiny mesenteric fat containing periumbilical hernia. Small (approximately 2.5 x 1.8 cm) mesenteric fat containing hernia about the caudal aspect of the midline rectus sheath.  IMPRESSION: 1. Post appendiceal abscess drainage catheter placements with interval reduction in size of dominant periappendiceal abscess, currently measuring 5.3 cm, previously, 6.7 cm with tiny (approximately 3.2 cm) serpiginous fluid collection about the cranial aspect of the more cranially located percutaneous drainage catheter extending along  the anterior aspect the right iliopsoas musculature. 2. No new definable/drainable intra-abdominal fluid collections. 3. Interval development of mild right-sided pelvicaliectasis presumably secondary to the inflammatory change within the right lower abdominal quadrant. PLAN:  Although there has been decrease in size of the fluid collection about the more cranially located percutaneous drainage catheter, there has been greater output from this drain (approximately 10 cc per day) and as such, the decision was made to leave this percutaneous drainage catheter in place.  Given the persistent sizable periappendiceal abscess, the decision was made to perform a fluoroscopic guided percutaneous drainage catheter repositioning and up sizing which will be performed tomorrow at [facility] with conscious sedation.  This was discussed and agreed upon by the referring physician, Dr. Donne [name].   Electronically Signed   By: Sandi Mariscal M.D.   On: 04/16/2015 11:07   Ct Abdomen Pelvis W Contrast  04/01/2015   CLINICAL DATA:  Right lower quadrant pain with fever for 2 days  EXAM: CT ABDOMEN AND PELVIS WITH CONTRAST  TECHNIQUE: Multidetector CT imaging of the abdomen and pelvis was performed using the standard protocol following bolus administration of intravenous contrast.  CONTRAST:  30mL OMNIPAQUE IOHEXOL 300 MG/ML SOLN, 146mL OMNIPAQUE IOHEXOL 300 MG/ML SOLN  COMPARISON:  None.  FINDINGS: Lower chest: Partially visualized pulmonary nodule right lower lobe measuring  at least 5 mm. Left lower lobe pulmonary nodule measures 4 mm.  Hepatobiliary: Normal  Pancreas: Normal  Spleen: Normal  Adrenals/Urinary Tract: Normal  Stomach/Bowel: Status post gastric bypass. Nonobstructive gas pattern. Inferior to the cecal tip, there is a complex multiloculated fluid collection with thick enhancing walls measuring about 6.5 x 4.5 cm. There is surrounding inflammatory change. 5 mm focus of calcification is seen along the anterior aspect  of this abnormality. Appendix is not well seen, but there is the suggestion of a dilated tubular structure in the region measuring up to about 2 cm.  Vascular/Lymphatic: Minimal atherosclerotic calcification of the aorta  Reproductive: Not seen  Musculoskeletal: No acute findings  IMPRESSION: Appendix not well identified, but the findings are most consistent with appendicitis, perforated, with periappendiceal abscess.  Pulmonary nodules. If the patient is at high risk for bronchogenic carcinoma, follow-up chest CT at 6-12 months is recommended. If the patient is at low risk for bronchogenic carcinoma, follow-up chest CT at 12 months is recommended. This recommendation follows the consensus statement: Guidelines for Management of Small Pulmonary Nodules Detected on CT Scans: A Statement from the [employer] as published in Radiology 2005;237:395-400.   Electronically Signed   By: Skipper Cliche M.D.   On: 04/01/2015 22:44   Ir Catheter Tube Change  04/17/2015   CLINICAL DATA:  History of ruptured appendicitis, post percutaneous periappendiceal abscess drainage catheter placement on 04/03/2015.  Patient return to the interventional radiology drain Clinic on 04/16/2015 which demonstrated a persistent periappendiceal abscess and as such the decision was made to fluoroscopically manipulate and upsize the percutaneous drainage catheter  EXAM: IR CATHETER TUBE CHANGE  COMPARISON:  CT abdomen pelvis - 04/01/2015; 04/16/2015; CT-guided percutaneous drainage catheter placement - 04/03/2015  ANESTHESIA/SEDATION: Versed 1.5 mg IV; Fentanyl 75 mcg IV  CONTRAST:  10 cc Omnipaque 300  FLUOROSCOPY TIME:  3 minutes, 30 seconds (43 mGy)  TECHNIQUE: The patient was positioned supine on the fluoroscopy table. External portion of the to right lower quadrant percutaneous drainage catheter as well as the surrounding skin were prepped and draped in usual sterile fashion. A time-out was performed prior to the initiation of the  procedure.  A preprocedural spot fluoroscopic image was obtained of the right lower abdominal quadrant and existing percutaneous drainage catheters.  Contrast was injected via the more caudally positioned percutaneous drainage catheter demonstrating communication within in the more cranially located dominant residual abscess cavity.  The external portion of the percutaneous drainage catheter was cut and cannulated with a short Amplatz wire. Under intermittent fluoroscopic guidance, the more caudally located percutaneous drainage catheter was exchanged for a Kumpe the catheter which was utilized to manipulate the Amplatz wire into the dominant residual abscess cavity. Under intermittent fluoroscopic guidance, the Kumpe the catheter was exchanged for a new slightly larger now 12 French percutaneous drainage catheter with and coiled and locked within the central aspect of the residual abscess cavity. Limited contrast injection was performed in a postprocedural spot fluoroscopic image was obtained. External portion of the percutaneous drainage catheter was secured at the skin entrance site within interrupted suture.  The newly exchanged percutaneous drainage catheter was connected to a JP bulb while the unchanged more cranially located percutaneous drainage catheter was connected to a gravity bag.  The patient tolerated the procedure well without immediate postprocedural complication.  FINDINGS: Contrast injection demonstrates communication with the more caudally located percutaneous drainage catheter and the residual dominant abscess within the right lower abdominal quadrant.  After fluoroscopic guided  manipulation and up sizing, the new 12 French percutaneous drainage catheter is coiled and locked within the residual abscess cavity.  IMPRESSION: Technically successful fluoroscopic guided manipulation and up sizing of the more caudally located percutaneous drainage catheter with tip now coiled and locked within the  dominant residual abscess within the right lower abdominal quadrant.  PLAN: The more caudally located percutaneous drainage catheter was connected to a JP bulb in hopes of improving residual abscess evacuation.  The patient was instructed to flush both percutaneous drainage catheters with 10 cc of normal saline twice a day.  The patient will return to the interventional radiology drain clinic in 2 weeks (during the week of 10/3) for repeat IV only contrast enhanced CT the abdomen and pelvis and percutaneous drainage catheter management.   Electronically Signed   By: Sandi Mariscal M.D.   On: 04/17/2015 17:31   Ct Image Guided Drainage By Percutaneous Catheter  04/03/2015   INDICATION: History of ruptured appendicitis. Please perform CT-guided percutaneous drainage catheter placement.  EXAM: CT IMAGE GUIDED DRAINAGE BY PERCUTANEOUS CATHETER x2  COMPARISON:  CT abdomen pelvis -04/01/2015  MEDICATIONS: The patient is currently admitted to the hospital and receiving intravenous antibiotics. The antibiotics were administered within an appropriate time frame prior to the initiation of the procedure.  ANESTHESIA/SEDATION: Fentanyl 150 mcg IV; Versed 3 mg IV  Total Moderate Sedation time  27 minutes  CONTRAST:  None  COMPLICATIONS: None immediate  PROCEDURE: Informed written consent was obtained from the patient after a discussion of the risks, benefits and alternatives to treatment. The patient was placed supine on the CT gantry and a pre procedural CT was performed re-demonstrating the known complex loculated abscess/fluid collection within the right lower abdominal quadrant measuring at least 7.5 x 5.6 cm (image 64, series 2). The procedure was planned. A timeout was performed prior to the initiation of the procedure.  The skin overlying the right lateral lower abdomen was prepped and draped in the usual sterile fashion. The overlying soft tissues were anesthetized with 1% lidocaine with epinephrine. Appropriate trajectory  was planned with the use of a 22 gauge spinal needle. An 18 gauge trocar needle was advanced into the abscess/fluid collection and a short Amplatz super stiff wire was coiled within the collection. Appropriate positioning was confirmed with a limited CT scan. The tract was serially dilated allowing placement of a 10 [place] all-purpose drainage catheter. Appropriate positioning was confirmed with a limited postprocedural CT scan.  Following placement of the initial percutaneous drainage catheter, approximately 20 cc of purulent material was aspirated from the fluid collection. Postprocedural scanning demonstrated a residual additional fluid collection caudal and medial to the initial percutaneous drainage catheter, as such, decision was made to place an additional percutaneous drain.  Again, the overlying soft tissues were anesthetized 1% lidocaine with epinephrine. Appropriate trajectory was planned with the use of a 22 gauge spinal needle. An 18 gauge trocar needle was advanced into the residual abscess/fluid collection and a short Amplatz super stiff wire was coiled within the collection. Appropriate positioning was confirmed with a limited CT scan. The tract was serially dilated allowing placement of an and additional 10 [place] all-purpose drainage catheter. Appropriate positioning was confirmed with a limited postprocedural CT scan. Approximately 10 ml of purulent fluid was aspirated. The tube was connected to a drainage bag and sutured in place.  All aspirated fluid was capped and sent to the laboratory for analysis. Dressings were placed. The patient tolerated the above procedures well without immediate  postprocedural complication.  IMPRESSION: Successful CT guided placement of 2 10 French all purpose drain catheters into the right lower abdominal quadrant with aspiration of of a total of 30 mL of purulent fluid. Samples were sent to the laboratory as requested by the ordering clinical team.   Electronically  Signed   By: Sandi Mariscal M.D.   On: 04/03/2015 11:36    Labs:  CBC:  Recent Labs  04/02/15 0400 04/03/15 0425 04/04/15 0401 04/17/15 1534  WBC 10.9* 11.7* 7.3 5.8  HGB 7.9* 7.8* 7.4* 8.7*  HCT 26.0* 25.0* 23.7* 28.6*  PLT 253 247 268 439*    COAGS:  Recent Labs  04/02/15 1110  INR 1.48  APTT 38*    BMP:  Recent Labs  04/02/15 0400 04/03/15 0425 04/04/15 0401 04/17/15 1534  NA 135 133* 134* 136  K 3.3* 3.7 3.4* 4.3  CL 105 102 104 101  CO2 22 22 21* 27  GLUCOSE 95 100* 124* 96  BUN 5* <5* <5* 13  CALCIUM 8.6* 8.7* 8.5* 9.8  CREATININE 0.51 0.51 0.50 0.60  GFRNONAA >60 >60 >60 >60  GFRAA >60 >60 >60 >60    LIVER FUNCTION TESTS:  Recent Labs  04/01/15 2140  BILITOT 0.8  AST 20  ALT 16  ALKPHOS 107  PROT 8.0  ALBUMIN 3.6    TUMOR MARKERS: No results for input(s): AFPTM, CEA, CA199, CHROMGRNA in the last 8760 hours.  Assessment and Plan:  Follow-up CT shows significant improvement in the appendiceal abscess with a much smaller amount of fluid remaining adjacent to the more inferior of the 2 drains. Given minimal continued output from this drain, I recommended that we leave this drain in place connected to a suction bulb. I recommended that we decrease flushes to once daily.  The more superior drain was removed today without difficulty and in its entirety. A dressing was applied over the exit site.  The patient will follow-up in one week for repeat CT to assess residual abscess. Given the low output currently, it is likely that the single remaining drain may be able to be removed at that time. Staged appendectomy has been recommended by Dr. Ninfa Linden approximately 6 weeks after drain removal.  Signed: Thomas Mabry T 04/29/2015, 2:20 PM    I spent a total of  15 Minutes in face to face in clinical consultation, greater than 50% of which was counseling/coordinating care status post drainage of appendiceal abscess.

## 2015-05-06 ENCOUNTER — Ambulatory Visit
Admission: RE | Admit: 2015-05-06 | Discharge: 2015-05-06 | Disposition: A | Discharge status: Home / Self Care | Payer: Commercial Managed Care - HMO | Source: Ambulatory Visit | Attending: Interventional Radiology | Admitting: Interventional Radiology

## 2015-05-06 ENCOUNTER — Other Ambulatory Visit (HOSPITAL_COMMUNITY): Payer: Self-pay | Admitting: Interventional Radiology

## 2015-05-06 ENCOUNTER — Ambulatory Visit
Admission: RE | Admit: 2015-05-06 | Discharge: 2015-05-06 | Disposition: A | Discharge status: Home / Self Care | Payer: Commercial Managed Care - HMO | Source: Ambulatory Visit | Attending: Surgery | Admitting: Surgery

## 2015-05-06 DIAGNOSIS — K3580 Unspecified acute appendicitis: Secondary | ICD-10-CM

## 2015-05-06 DIAGNOSIS — K3532 Acute appendicitis with perforation and localized peritonitis, without abscess: Secondary | ICD-10-CM

## 2015-05-06 DIAGNOSIS — K3533 Acute appendicitis with perforation and localized peritonitis, with abscess: Secondary | ICD-10-CM

## 2015-05-06 MED ORDER — IOPAMIDOL (ISOVUE-300) INJECTION 61%
100.0000 mL | Freq: Once | INTRAVENOUS | Status: AC | PRN
  Administered 2015-05-06: 100 mL via INTRAVENOUS

## 2015-05-06 NOTE — Progress Notes (Signed)
Patient ID: [name], female   DOB: September 10, 1965, 49 y.o.   MRN: [id number]       Chief Complaint: History of ruptured appendicitis, post drainage catheter placement  Referring Physician(s): [place]  History of Present Illness: [name] is a 49 y.o. female who is well-known to the interventional radiology drain clinic following placement of 2 percutaneous drainage catheter is on 04/03/2015. Patient underwent successful fluoroscopic guided percutaneous drainage catheter exchange and upsizing on 04/17/2015.  Subsequent CT scan of the abdomen and pelvis performed 04/28/05/2016 benefits interval resolution of previously noted more cranially located periappendiceal abscess, and as such, the more currently located percutaneous drainage catheter is removed at that time.  The patient currently reports approximate 5-10 mL of output from the remaining drainage catheter per day. She continues to flush the percutaneous drainage catheter once a day. She has no worsening of her right lower abdominal quadrant pain. She reports a low-grade fever though attributes this to a viral upper acid tori infection. She otherwise without complaint.  CT scan of the abdomen and pelvis performed earlier today demonstrates appropriate positioning of the remaining percutaneous drainage catheter however there is a residual periappendiceal abscess for which the patient underwent a fluoroscopic guided injection which demonstrated persistent communication with the end of the percutaneous drainage catheter and the adjacent cecum.   Past Medical History  Diagnosis Date  . Anemia   . Depression     Past Surgical History  Procedure Laterality Date  . Abdominal hysterectomy    . Gastric bypass      Allergies: Cymbalta and Penicillins  Medications: Prior to Admission medications   Medication Sig Start Date End Date Taking? Authorizing Provider  acetaminophen (TYLENOL) 500 MG tablet Take 1,000 mg by mouth every 6  (six) hours as needed for fever.   Yes Historical Provider, MD  cyclobenzaprine (FLEXERIL) 10 MG tablet Take 10 mg by mouth 3 (three) times daily as needed for muscle spasms.   Yes Historical Provider, MD  escitalopram (LEXAPRO) 10 MG tablet Take 10 mg by mouth daily.   Yes Historical Provider, MD  ferrous sulfate 325 (65 FE) MG tablet Take 325 mg by mouth 2 (two) times daily with a meal.   Yes Historical Provider, MD  oxyCODONE (OXY IR/ROXICODONE) 5 MG immediate release tablet Take 1-2 tablets (5-10 mg total) by mouth every 4 (four) hours as needed for moderate pain. 04/07/15  Yes Saverio Danker, PA-C  traMADol (ULTRAM) 50 MG tablet Take 50 mg by mouth every 6 (six) hours as needed for moderate pain.   Yes Historical Provider, MD  vitamin B-12 (CYANOCOBALAMIN) 1000 MCG tablet Take 500 mcg by mouth daily.   Yes Historical Provider, MD  vitamin C (ASCORBIC ACID) 500 MG tablet Take 500 mg by mouth daily.   Yes Historical Provider, MD  ciprofloxacin (CIPRO) 500 MG tablet Take 1 tablet (500 mg total) by mouth 2 (two) times daily. Patient not taking: Reported on 05/06/2015 04/07/15   Saverio Danker, PA-C  clindamycin (CLEOCIN) 300 MG capsule Take 1 capsule (300 mg total) by mouth every 8 (eight) hours. Patient not taking: Reported on 05/06/2015 04/07/15   Saverio Danker, PA-C     No family history on file.  Social History   Social History  . Marital Status: Single    Spouse Name: N/A  . Number of Children: N/A  . Years of Education: N/A   Social History Main Topics  . Smoking status: Never Smoker   . Smokeless tobacco: Not on file  .  Alcohol Use: Yes  . Drug Use: No  . Sexual Activity: Not on file   Other Topics Concern  . Not on file   Social History Narrative    ECOG Status: 1 - Symptomatic but completely ambulatory  Review of Systems: A 12 point ROS discussed and pertinent positives are indicated in the HPI above.  All other systems are negative.  Review of Systems  Vital  Signs: BP 117/69 mmHg  Pulse 78  Temp(Src) 98.1 F (36.7 C) (Oral)  Resp 14  SpO2 100%  Physical Exam  Abdominal:    Location of percutaneous drain.    Imaging: Ct Abdomen Pelvis W Contrast  05/06/2015  CLINICAL DATA:  History appendiceal abscess post CT-guided percutaneous drainage catheter placement x 2 on 04/03/2015. Patient underwent a successful fluoroscopic guided percutaneous drainage catheter exchange and up sizing on 04/17/2015. Subsequent CT scan of the abdomen pelvis performed on 04/29/2015 demonstrates interval resolution of previously noted more cranially located periappendiceal abscess, as such, this more cranially located percutaneous drainage catheter was removed. The patient returns today to the interventional radiology drain clinic for repeat contrast-enhanced CT scan of the abdomen and pelvis as well as management of the existing percutaneous drainage catheter. EXAM: CT ABDOMEN AND PELVIS WITH CONTRAST TECHNIQUE: Multidetector CT imaging of the abdomen and pelvis was performed using the standard protocol following bolus administration of intravenous contrast. CONTRAST:  161mL ISOVUE-300 IOPAMIDOL (ISOVUE-300) INJECTION 61% COMPARISON:  CT scan the abdomen pelvis - 04/29/2015; 04/16/2015 04/01/2015; CT-guided percutaneous drainage catheter placement x2 - 04/03/2015; fluoroscopic percutaneous drainage catheter injection and upsizing - 04/17/2015 FINDINGS: Unchanged positioning of residual percutaneous drainage catheter with end coiled and locked within the residual grossly unchanged approximately 2.7 x 3.7 cm appendiceal abscess (image 49, series 2), previously, 2.7 x 1.7 cm with slight differences likely attributable to slice selection. Again, a punctate (approximately 5 mm) appendicolith is noted within the central aspect of this residual fluid collection (image 49, series 3). No new definable/drainable fluid collections. Normal hepatic contour. No discrete hepatic lesions.  Normal appearance of the gallbladder given degree distention. No radiopaque gallstones. No ascites. There is symmetric enhancement of the bilateral kidneys. No definite renal stones on this postcontrast examination. There is very mild right-sided ureterectasis which is again favored to be secondary to the residual inflammatory change in the right lower abdominal quadrant. No left-sided urinary obstruction. Very minimal amount of asymmetric left-sided perinephric stranding again likely secondary to inflammatory process with the right lower abdominal quadrant. Normal appearance of the bilateral adrenal glands, pancreas and spleen. 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 Moderate colonic stool burden without evidence of enteric obstruction. No pneumoperitoneum, pneumatosis or portal venous gas. Scattered minimal mixed calcified and noncalcified atherosclerotic plaque within a normal caliber abdominal aorta. The major branch vessels of the abdominal aorta appear widely patent on this non CTA examination. Shotty mesenteric lymph nodes are individually not enlarged by size criteria with index mesenteric lymph node within the caudal aspect of the root of the mesenteric measuring approximately 0.9 cm in greatest short axis diameter (image 46, series 3), likely reactive in etiology. No definitive bulky retroperitoneal, mesenteric, pelvic or inguinal lymphadenopathy. Post hysterectomy. Normal appearance of the urinary bladder given degree distention. No free fluid in the pelvic cul-de-sac. Limited visualization the lower thorax demonstrates minimal dependent subpleural ground-glass atelectasis within the imaged right lower lobe. No focal airspace opacities. No pleural effusion. Normal heart size.  No pericardial effusion. No acute or aggressive osseous abnormalities. Mild-to-moderate multilevel lumbar  spine DDD, worse at L5-S1 with disc space height loss, endplate irregularity and sclerosis. A bone island is  incidentally noted within the left femoral head. Regional soft tissues appear normal. IMPRESSION: Unchanged positioning of right lower quadrant percutaneous drainage catheter with end coiled and locked within tiny (approximately 3.7 x 2.7 cm periappendiceal fluid collection. This patient subsequent underwent percutaneous drainage catheter injection. Electronically Signed   By: Sandi Mariscal M.D.   On: 05/06/2015 15:03   Ct Abdomen Pelvis W Contrast  04/29/2015  CLINICAL DATA:  Ruptured appendicitis and status post percutaneous drainage of appendiceal abscess on 04/03/2015. Two separate drainage catheters were placed at that time to treat the complex abscess. The more inferior of the 2 drains was exchanged and upsized on 04/17/2015 to treat residual abscess. EXAM: CT ABDOMEN AND PELVIS WITH CONTRAST TECHNIQUE: Multidetector CT imaging of the abdomen and pelvis was performed using the standard protocol following bolus administration of intravenous contrast. CONTRAST:  138mL ISOVUE-300 IOPAMIDOL (ISOVUE-300) INJECTION 61% COMPARISON:  04/16/2015 FINDINGS: The superior drainage catheter terminating medial to the lower right kidney and adjacent to the right psoas muscle shows stable positioning with no evidence of residual abscess. A small amount of fluid remains adjacent to the distal aspect of the more inferior percutaneous drainage catheter. This fluid collection shows decrease in size since prior imaging with fluid component now measuring approximately 1.7 x 2.7 cm compared to roughly 5 cm in maximum diameter on the prior study. No new abscess is identified. There is no evidence of bowel obstruction. The liver, gallbladder, pancreas, spleen, adrenal glands and kidneys remain normal. Stable appearance of the stomach status post prior gastric bypass. No ascites. Stable small midline ventral hernia containing fat. The bladder is decompressed. IMPRESSION: 1. Resolution of right-sided retroperitoneal abscess situated  between the lower right kidney and right psoas muscle. This drainage catheter was removed following the CT study. 2. Significant decrease in size of residual appendiceal abscess adjacent to the recently upsized and exchanged percutaneous drain located more inferiorly in the right lower quadrant. Due to small amount of residual fluid remaining, the patient was instructed to decrease flushes to once daily rather than twice daily and continue to monitor output via a suction bulb. Current output is only approximately 5-10 mL per day. 3. See additional IR Drain Clinic Note dictated today in [place]. Electronically Signed   By: Aletta Edouard M.D.   On: 04/29/2015 14:18   Ct Abdomen Pelvis W Contrast  04/16/2015  CLINICAL DATA:  History of appendiceal abscess, post CT-guided percutaneous drainage catheter placement on 04/03/2015. Patient returns today to the interventional radiology clinic for CT the abdomen pelvis as well as percutaneous drainage catheter management. The patient reports approximately 10 cc per day of output from the more cranially located percutaneous drainage catheter an approximately 5 cc per day from the more caudally located percutaneous drainage catheter. The patient continues to flush both percutaneous drainage catheters every other day. The patient denies fever or chills. Tolerating p.o. without incident. No flank pain. EXAM: CT ABDOMEN AND PELVIS WITH CONTRAST TECHNIQUE: Multidetector CT imaging of the abdomen and pelvis was performed using the standard protocol following bolus administration of intravenous contrast. CONTRAST:  174mL ISOVUE-300 IOPAMIDOL (ISOVUE-300) INJECTION 61% COMPARISON:  CT abdomen pelvis - 04/11/2015; CT-guided percutaneous drainage catheter placement - 04/03/2015 FINDINGS: Stable positioning of right lower quadrant percutaneous abscess drainage catheters. There is a tiny serpiginous peripherally enhancing fluid collection about the cranial aspect of the more cranially  located percutaneous drainage catheter  which measures approximately 3.2 x 0.5 cm (coronal image 62, series 601) with fluid tracking along the anterior aspect of the right ileo psoas musculature (image 41, series 3). There is a residual approximately 5.3 x 2.5 cm peripherally enhancing abscess about the cranial aspect of the more caudally located percutaneous drainage catheter (representative coronal image 52, series 601), previously, approximately 6.7 x 4.6 cm. No new definable/drainable intra-abdominal fluid collections. 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 Moderate to large colonic stool burden without evidence of enteric obstruction. No pneumoperitoneum, pneumatosis or portal venous gas. Post hysterectomy. Normal appearance of the urinary bladder given degree distention. No free fluid in the pelvic cul-de-sac. Normal hepatic contour. No discrete hepatic lesions. Normal appearance of the gallbladder. No radiopaque soft veins. No intra extrahepatic biliary duct dilatation. No ascites. There is symmetric enhancement of the bilateral kidneys. Interval development of mild right-sided pelvicaliectasis the IUD of which is not definitively depicted on the present examination though presumably secondary to the inflammatory change within the right lower abdominal quadrant. No evidence of left-sided urinary obstruction. No definite renal stones this postcontrast examination. No discrete renal lesions. Scattered minimal amount of mixed calcified and noncalcified atherosclerotic plaque within a normal caliber abdominal aorta. The major branch vessels of the abdominal aorta appear widely patent on this non CTA examination. Scattered retroperitoneal lymph nodes are numerous though individually not enlarged by size criteria. No bulky retroperitoneal, mesenteric, pelvic or inguinal lymphadenopathy. Limited visualization of the lower thorax demonstrates a approximately 0.5 cm nodule within the right (image 2, series 4)  and left (image 7) lower lobes. There is minimal subsegmental atelectasis within the left costophrenic angle. No focal airspace opacities. No pleural effusion. Normal heart size.  No pericardial effusion. No acute or aggressive osseous abnormalities. Tiny mesenteric fat containing periumbilical hernia. Small (approximately 2.5 x 1.8 cm) mesenteric fat containing hernia about the caudal aspect of the midline rectus sheath. IMPRESSION: 1. Post appendiceal abscess drainage catheter placements with interval reduction in size of dominant periappendiceal abscess, currently measuring 5.3 cm, previously, 6.7 cm with tiny (approximately 3.2 cm) serpiginous fluid collection about the cranial aspect of the more cranially located percutaneous drainage catheter extending along the anterior aspect the right iliopsoas musculature. 2. No new definable/drainable intra-abdominal fluid collections. 3. Interval development of mild right-sided pelvicaliectasis presumably secondary to the inflammatory change within the right lower abdominal quadrant. PLAN: Although there has been decrease in size of the fluid collection about the more cranially located percutaneous drainage catheter, there has been greater output from this drain (approximately 10 cc per day) and as such, the decision was made to leave this percutaneous drainage catheter in place. Given the persistent sizable periappendiceal abscess, the decision was made to perform a fluoroscopic guided percutaneous drainage catheter repositioning and up sizing which will be performed tomorrow at [facility] with conscious sedation. This was discussed and agreed upon by the referring physician, Dr. Donne [name]. Electronically Signed   By: Sandi Mariscal M.D.   On: 04/16/2015 11:07   Ir Catheter Tube Change  04/17/2015  CLINICAL DATA:  History of ruptured appendicitis, post percutaneous periappendiceal abscess drainage catheter placement on 04/03/2015. Patient return to the  interventional radiology drain Clinic on 04/16/2015 which demonstrated a persistent periappendiceal abscess and as such the decision was made to fluoroscopically manipulate and upsize the percutaneous drainage catheter EXAM: IR CATHETER TUBE CHANGE COMPARISON:  CT abdomen pelvis - 04/01/2015; 04/16/2015; CT-guided percutaneous drainage catheter placement - 04/03/2015 ANESTHESIA/SEDATION: Versed 1.5 mg IV; Fentanyl 75  mcg IV CONTRAST:  10 cc Omnipaque 300 FLUOROSCOPY TIME:  3 minutes, 30 seconds (43 mGy) TECHNIQUE: The patient was positioned supine on the fluoroscopy table. External portion of the to right lower quadrant percutaneous drainage catheter as well as the surrounding skin were prepped and draped in usual sterile fashion. A time-out was performed prior to the initiation of the procedure. A preprocedural spot fluoroscopic image was obtained of the right lower abdominal quadrant and existing percutaneous drainage catheters. Contrast was injected via the more caudally positioned percutaneous drainage catheter demonstrating communication within in the more cranially located dominant residual abscess cavity. The external portion of the percutaneous drainage catheter was cut and cannulated with a short Amplatz wire. Under intermittent fluoroscopic guidance, the more caudally located percutaneous drainage catheter was exchanged for a Kumpe the catheter which was utilized to manipulate the Amplatz wire into the dominant residual abscess cavity. Under intermittent fluoroscopic guidance, the Kumpe the catheter was exchanged for a new slightly larger now 12 French percutaneous drainage catheter with and coiled and locked within the central aspect of the residual abscess cavity. Limited contrast injection was performed in a postprocedural spot fluoroscopic image was obtained. External portion of the percutaneous drainage catheter was secured at the skin entrance site within interrupted suture. The newly exchanged  percutaneous drainage catheter was connected to a JP bulb while the unchanged more cranially located percutaneous drainage catheter was connected to a gravity bag. The patient tolerated the procedure well without immediate postprocedural complication. FINDINGS: Contrast injection demonstrates communication with the more caudally located percutaneous drainage catheter and the residual dominant abscess within the right lower abdominal quadrant. After fluoroscopic guided manipulation and up sizing, the new 12 French percutaneous drainage catheter is coiled and locked within the residual abscess cavity. IMPRESSION: Technically successful fluoroscopic guided manipulation and up sizing of the more caudally located percutaneous drainage catheter with tip now coiled and locked within the dominant residual abscess within the right lower abdominal quadrant. PLAN: The more caudally located percutaneous drainage catheter was connected to a JP bulb in hopes of improving residual abscess evacuation. The patient was instructed to flush both percutaneous drainage catheters with 10 cc of normal saline twice a day. The patient will return to the interventional radiology drain clinic in 2 weeks (during the week of 10/3) for repeat IV only contrast enhanced CT the abdomen and pelvis and percutaneous drainage catheter management. Electronically Signed   By: Sandi Mariscal M.D.   On: 04/17/2015 17:31    Labs:  CBC:  Recent Labs  04/02/15 0400 04/03/15 0425 04/04/15 0401 04/17/15 1534  WBC 10.9* 11.7* 7.3 5.8  HGB 7.9* 7.8* 7.4* 8.7*  HCT 26.0* 25.0* 23.7* 28.6*  PLT 253 247 268 439*    COAGS:  Recent Labs  04/02/15 1110  INR 1.48  APTT 38*    BMP:  Recent Labs  04/02/15 0400 04/03/15 0425 04/04/15 0401 04/17/15 1534  NA 135 133* 134* 136  K 3.3* 3.7 3.4* 4.3  CL 105 102 104 101  CO2 22 22 21* 27  GLUCOSE 95 100* 124* 96  BUN 5* <5* <5* 13  CALCIUM 8.6* 8.7* 8.5* 9.8  CREATININE 0.51 0.51 0.50 0.60   GFRNONAA >60 >60 >60 >60  GFRAA >60 >60 >60 >60    LIVER FUNCTION TESTS:  Recent Labs  04/01/15 2140  BILITOT 0.8  AST 20  ALT 16  ALKPHOS 107  PROT 8.0  ALBUMIN 3.6    TUMOR MARKERS: No results for input(s): AFPTM,  CEA, CA199, CHROMGRNA in the last 8760 hours.  Assessment and Plan:  Electra Paladino is a 49 y.o. female who is well-known to the interventional radiology drain clinic following placement of 2 percutaneous drainage catheter is on 04/03/2015. Patient underwent successful fluoroscopic guided percutaneous drainage catheter exchange and upsizing on 04/17/2015.  CT scan of the abdomen and pelvis performed earlier today demonstrates appropriate positioning of the remaining percutaneous drainage catheter however there is a residual periappendiceal abscess for which the patient underwent a fluoroscopic guided injection which demonstrated persistent communication with the end of the percutaneous drainage catheter and the adjacent cecum.  Despite low output from the remaining percutaneous drainage catheter, I feel that given the presence of the large fistulous connection/perforation to the adjacent cecum there is a very low likelihood of interval improvement and that is very unlikely the patient's large complex perforated appendicitis will be resolved with percutaneous management.  For this reason, as well as cumulative radiation exposure, I do not feel this patient will benefit from repeat surveillance CT or drainage catheter injections and as such a follow-up drain clinic appointment was scheduled at the present time.  I instructed the patient to keep her scheduled appointment with referring cyst physician, Dr. Donne [name] on October 25, at which time operative intervention will be discussed.  A copy of this report was sent to the requesting provider on this date.  SignedSandi Mariscal 05/06/2015, 5:30 PM   I spent a total of 10 Minutes in face to face in clinical consultation,  greater than 50% of which was counseling/coordinating care for periappendiceal abscess drainage catheter

## 2015-05-28 ENCOUNTER — Other Ambulatory Visit: Payer: Self-pay | Admitting: General Surgery

## 2015-05-28 DIAGNOSIS — K3533 Acute appendicitis with perforation and localized peritonitis, with abscess: Secondary | ICD-10-CM

## 2015-06-03 ENCOUNTER — Ambulatory Visit (HOSPITAL_COMMUNITY)
Admission: RE | Admit: 2015-06-03 | Discharge: 2015-06-03 | Disposition: A | Discharge status: Home / Self Care | Payer: Commercial Managed Care - HMO | Source: Ambulatory Visit | Attending: General Surgery | Admitting: General Surgery

## 2015-06-03 DIAGNOSIS — K353 Acute appendicitis with localized peritonitis: Secondary | ICD-10-CM | POA: Insufficient documentation

## 2015-06-03 DIAGNOSIS — Z4803 Encounter for change or removal of drains: Secondary | ICD-10-CM | POA: Insufficient documentation

## 2015-06-03 DIAGNOSIS — K3533 Acute appendicitis with perforation and localized peritonitis, with abscess: Secondary | ICD-10-CM

## 2015-06-03 MED ORDER — IOHEXOL 300 MG/ML  SOLN
50.0000 mL | Freq: Once | INTRAMUSCULAR | Status: DC | PRN
  Administered 2015-06-03: 10 mL
  Filled 2015-06-03: qty 50

## 2015-06-03 MED ORDER — LIDOCAINE HCL 1 % IJ SOLN
INTRAMUSCULAR | Status: AC
  Filled 2015-06-03: qty 20

## 2015-06-03 NOTE — Procedures (Signed)
Technically successful fluoroscopic guided percutaneous drainage catheter exchange and repositioning. No immediate post procedural complications.  Ronny Bacon, MD Pager #: [phone]

## 2015-06-16 ENCOUNTER — Emergency Department (HOSPITAL_COMMUNITY): Payer: Commercial Managed Care - HMO

## 2015-06-16 ENCOUNTER — Emergency Department (HOSPITAL_COMMUNITY)
Admission: EM | Admit: 2015-06-16 | Discharge: 2015-06-16 | Disposition: A | Discharge status: Home / Self Care | Payer: Commercial Managed Care - HMO | Attending: Emergency Medicine | Admitting: Emergency Medicine

## 2015-06-16 ENCOUNTER — Encounter (HOSPITAL_COMMUNITY): Payer: Self-pay | Admitting: Emergency Medicine

## 2015-06-16 DIAGNOSIS — Z90710 Acquired absence of both cervix and uterus: Secondary | ICD-10-CM | POA: Insufficient documentation

## 2015-06-16 DIAGNOSIS — Z79899 Other long term (current) drug therapy: Secondary | ICD-10-CM | POA: Insufficient documentation

## 2015-06-16 DIAGNOSIS — D649 Anemia, unspecified: Secondary | ICD-10-CM | POA: Insufficient documentation

## 2015-06-16 DIAGNOSIS — R1084 Generalized abdominal pain: Secondary | ICD-10-CM | POA: Insufficient documentation

## 2015-06-16 DIAGNOSIS — F329 Major depressive disorder, single episode, unspecified: Secondary | ICD-10-CM | POA: Insufficient documentation

## 2015-06-16 DIAGNOSIS — Z880 Allergy status to penicillin: Secondary | ICD-10-CM | POA: Insufficient documentation

## 2015-06-16 DIAGNOSIS — Z9884 Bariatric surgery status: Secondary | ICD-10-CM | POA: Insufficient documentation

## 2015-06-16 LAB — COMPREHENSIVE METABOLIC PANEL
ALBUMIN: 3.8 g/dL (ref 3.5–5.0)
ALT: 11 U/L — ABNORMAL LOW (ref 14–54)
ANION GAP: 9 (ref 5–15)
AST: 15 U/L (ref 15–41)
Alkaline Phosphatase: 123 U/L (ref 38–126)
BUN: 9 mg/dL (ref 6–20)
CHLORIDE: 102 mmol/L (ref 101–111)
CO2: 25 mmol/L (ref 22–32)
Calcium: 9.2 mg/dL (ref 8.9–10.3)
Creatinine, Ser: 0.58 mg/dL (ref 0.44–1.00)
GFR calc Af Amer: >60 mL/min (ref >60)
GFR calc non Af Amer: >60 mL/min (ref >60)
GLUCOSE: 86 mg/dL (ref 65–99)
POTASSIUM: 4.1 mmol/L (ref 3.5–5.1)
Sodium: 136 mmol/L (ref 135–145)
TOTAL PROTEIN: 8.3 g/dL — AB (ref 6.5–8.1)
Total Bilirubin: 0.3 mg/dL (ref 0.3–1.2)

## 2015-06-16 LAB — CBC WITH DIFFERENTIAL/PLATELET
BASOS ABS: 0 10*3/uL (ref 0.0–0.1)
BASOS PCT: 1 %
Eosinophils Absolute: 0.3 10*3/uL (ref 0.0–0.7)
Eosinophils Relative: 4 %
HCT: 31 % — ABNORMAL LOW (ref 36.0–46.0)
Hemoglobin: 9.7 g/dL — ABNORMAL LOW (ref 12.0–15.0)
LYMPHS ABS: 2.8 10*3/uL (ref 0.7–4.0)
LYMPHS PCT: 34 %
MCH: 24.6 pg — ABNORMAL LOW (ref 26.0–34.0)
MCHC: 31.3 g/dL (ref 30.0–36.0)
MCV: 78.7 fL (ref 78.0–100.0)
Monocytes Absolute: 0.7 10*3/uL (ref 0.1–1.0)
Monocytes Relative: 8 %
NEUTROS ABS: 4.4 10*3/uL (ref 1.7–7.7)
Neutrophils Relative %: 53 %
PLATELETS: 377 10*3/uL (ref 150–400)
RBC: 3.94 MIL/uL (ref 3.87–5.11)
RDW: 16.4 % — AB (ref 11.5–15.5)
WBC: 8.2 10*3/uL (ref 4.0–10.5)

## 2015-06-16 MED ORDER — IOHEXOL 300 MG/ML  SOLN
150.0000 mL | Freq: Once | INTRAMUSCULAR | Status: AC | PRN
  Administered 2015-06-16: 100 mL via INTRAVENOUS

## 2015-06-16 NOTE — ED Provider Notes (Signed)
CSN: [id number]     Arrival date & time 06/16/15  1706 History   First MD Initiated Contact with Patient 06/16/15 1754     Chief Complaint  Patient presents with  . Abdominal Pain     (Consider location/radiation/quality/duration/timing/severity/associated sxs/prior Treatment) Patient is a 49 y.o. female presenting with abdominal pain. The history is provided by the patient. No language interpreter was used.  Abdominal Pain Pain location:  Generalized Pain quality: aching and bloating   Pain radiates to:  Does not radiate Pain severity:  Moderate Onset quality:  Gradual Timing:  Constant Progression:  Worsening Chronicity:  New Context: not recent illness   Relieved by:  Nothing Worsened by:  Nothing tried Ineffective treatments:  None tried Associated symptoms: no diarrhea and no vomiting   Pt advised to come in for a ct by Dr. Donne [name].  Pt had a ruptured appendix in September and has a drain.  Pt reports drain may not be working correct.  Past Medical History  Diagnosis Date  . Anemia   . Depression    Past Surgical History  Procedure Laterality Date  . Abdominal hysterectomy    . Gastric bypass     No family history on file. Social History  Substance Use Topics  . Smoking status: Never Smoker   . Smokeless tobacco: None  . Alcohol Use: Yes   OB History    No data available     Review of Systems  Gastrointestinal: Positive for abdominal pain. Negative for vomiting and diarrhea.  All other systems reviewed and are negative.     Allergies  Cymbalta and Penicillins  Home Medications   Prior to Admission medications   Medication Sig Start Date End Date Taking? Authorizing Provider  acetaminophen (TYLENOL) 500 MG tablet Take 1,000 mg by mouth every 6 (six) hours as needed for fever.   Yes Historical Provider, MD  cyclobenzaprine (FLEXERIL) 10 MG tablet Take 10 mg by mouth 3 (three) times daily as needed for muscle spasms.   Yes Historical Provider, MD   escitalopram (LEXAPRO) 10 MG tablet Take 10 mg by mouth daily.   Yes Historical Provider, MD  ferrous sulfate 325 (65 FE) MG tablet Take 325 mg by mouth 2 (two) times daily with a meal.   Yes Historical Provider, MD  oxyCODONE (OXY IR/ROXICODONE) 5 MG immediate release tablet Take 1-2 tablets (5-10 mg total) by mouth every 4 (four) hours as needed for moderate pain. 04/07/15  Yes Saverio Danker, PA-C  Sodium Chloride Flush (NORMAL SALINE FLUSH) 0.9 % SOLN Inject 8 mLs into the vein daily. Uses to flush drain 05/30/15  Yes Historical Provider, MD  traMADol (ULTRAM) 50 MG tablet Take 50 mg by mouth every 6 (six) hours as needed for moderate pain.   Yes Historical Provider, MD  vitamin B-12 (CYANOCOBALAMIN) 1000 MCG tablet Take 500 mcg by mouth daily.   Yes Historical Provider, MD  vitamin C (ASCORBIC ACID) 500 MG tablet Take 500 mg by mouth daily.   Yes Historical Provider, MD  ciprofloxacin (CIPRO) 500 MG tablet Take 1 tablet (500 mg total) by mouth 2 (two) times daily. Patient not taking: Reported on 05/06/2015 04/07/15   Saverio Danker, PA-C  clindamycin (CLEOCIN) 300 MG capsule Take 1 capsule (300 mg total) by mouth every 8 (eight) hours. Patient not taking: Reported on 05/06/2015 04/07/15   Saverio Danker, PA-C   BP 131/88 mmHg  Pulse 98  Temp(Src) 98.2 F (36.8 C) (Oral)  Resp 18  SpO2 98% Physical  Exam  Constitutional: She is oriented to person, place, and time. She appears well-developed and well-nourished.  HENT:  Head: Normocephalic.  Right Ear: External ear normal.  Left Ear: External ear normal.  Nose: Nose normal.  Mouth/Throat: Oropharynx is clear and moist.  Eyes: Conjunctivae and EOM are normal. Pupils are equal, round, and reactive to light.  Neck: Normal range of motion.  Cardiovascular: Normal rate.   Pulmonary/Chest: Effort normal.  Abdominal: Soft. She exhibits no distension. There is tenderness.  Mild tenderness around drain site,  No peritoneal signs.  Musculoskeletal:  Normal range of motion.  Neurological: She is alert and oriented to person, place, and time.  Psychiatric: She has a normal mood and affect.  Nursing note and vitals reviewed.   ED Course  Procedures (including critical care time) Labs Review Labs Reviewed  CBC WITH DIFFERENTIAL/PLATELET - Abnormal; Notable for the following:    Hemoglobin 9.7 (*)    HCT 31.0 (*)    MCH 24.6 (*)    RDW 16.4 (*)    All other components within normal limits  COMPREHENSIVE METABOLIC PANEL - Abnormal; Notable for the following:    Total Protein 8.3 (*)    ALT 11 (*)    All other components within normal limits  URINALYSIS, ROUTINE W REFLEX MICROSCOPIC (NOT AT [facility])    Imaging Review Ct Abdomen Pelvis W Contrast  06/16/2015  CLINICAL DATA:  Patient with increased pain in the right lower quadrant. Patient with percutaneous drainage catheter for ruptured appendicitis. EXAM: CT ABDOMEN AND PELVIS WITH CONTRAST TECHNIQUE: Multidetector CT imaging of the abdomen and pelvis was performed using the standard protocol following bolus administration of intravenous contrast. CONTRAST:  157mL OMNIPAQUE IOHEXOL 300 MG/ML  SOLN COMPARISON:  CT abdomen pelvis 04/16/2015; 05/06/2015. FINDINGS: Lower chest: Normal heart size. Dependent atelectasis. There is a 5 mm right lower lobe pulmonary nodule (image 1; series 2). Dependent atelectasis. No pleural. Hepatobiliary: Liver is normal in size and contour. No focal hepatic lesion is identified. Gallbladder is unremarkable. Intrahepatic or extrahepatic biliary ductal dilatation. Pancreas: Unremarkable Spleen: Unremarkable Adrenals/Urinary Tract: The adrenal glands are normal. The kidneys enhance symmetrically with contrast. Urinary bladder is unremarkable. No hydronephrosis. Stomach/Bowel: No abnormal bowel wall thickening or evidence for bowel obstruction. Status post gastric bypass procedure. Vascular/Lymphatic: Normal caliber abdominal aorta. No retroperitoneal lymphadenopathy.  Prominent right lower quadrant mesenteric lymph node measuring up to 13 mm, likely reactive in etiology. Other: Within the right lower quadrant there is a 3.5 x 4.2 cm rim enhancing fluid collection containing a small appendicolith. This fluid collection is increased in size from prior examination where it measured 2.7 x 3.7 cm. The right anterior lateral abdominal wall approach percutaneous drainage catheter is present however the tip appears to be coiled along the inferior margin of abscess. It is unclear, given the location of the tip, if the abscess is still being drained by this percutaneous drainage catheter. Uterus is surgically absent. Musculoskeletal: There is now irregular enhancement involving the right iliopsoas muscle (image 53; series 2). Small fat containing ventral abdominal wall hernia (image 71; series 2). No aggressive or acute appearing osseous lesions. Lumbar spine degenerative changes. IMPRESSION: Interval increase in size of peripheral enhancing right lower quadrant fluid collection compatible with abscess. The tip of the percutaneous drainage catheter is coiled along the inferior margin of the fluid collection. Is unclear if this catheter is currently draining the fluid collection given positioning. There is now irregular enhancement along the lateral margin of the right iliopsoas  muscle concerning for extension of infectious process. Intramuscular abscess is not excluded. Multiple prominent mesenteric lymph nodes within the right lower quadrant most compatible with reactive adenopathy. 5 mm right lower lobe pulmonary nodule. If the patient is at high risk for bronchogenic carcinoma, follow-up chest CT at 6-12 months is recommended. If the patient is at low risk for bronchogenic carcinoma, follow-up chest CT at 12 months is recommended. This recommendation follows the consensus statement: Guidelines for Management of Small Pulmonary Nodules Detected on CT Scans: A Statement from the  [employer] as published in Radiology 2005;237:395-400. Electronically Signed   By: Lovey Newcomer M.D.   On: 06/16/2015 20:26   I have personally reviewed and evaluated these images and lab results as part of my medical decision-making.   EKG Interpretation None      MDM Dr. Harlow Asa called and he will see pt after ct scan   Final diagnoses:  Generalized abdominal pain    Dr. Harlow Asa in to see and examine pt.   He scheduled follow up for replacement of drain tomorrow and advised pt to follow up in the officeHe gave pt rx for cipro and flagyl and pain medication.      Hollace Kinnier Balta, PA-C 06/16/15 2143  Sharlett Iles, MD 06/16/15 [phone]

## 2015-06-16 NOTE — Discharge Instructions (Signed)

## 2015-06-16 NOTE — ED Notes (Signed)
Per pt, states she has had a drain placed in her appendix due to it rupturing sometime ago-states MD wants to get infection under control before he does surgery-states increased pain due to her being out of meds-drain changed October-states increased pain since

## 2015-06-16 NOTE — Consult Note (Signed)
General Surgery [facility]  Reason for Consult: abdominal pain, perforated appendicitis with abscess  Referring Physician: [facility]  Anglea Gordner is an 49 y.o. female.  HPI: patient is a 49 yo WF with diagnosis of perforated appendicitis.  Percutaneous drainage since Sept.  Now with increased pain.  WBC normal.  Presents to ER for CT scan which shows enlarging fluid collection consistent with abscess, drain on perimeter of collection and probably not adequately draining the abscess.  Patient accompanied by husband.  Does not desire admission.  Request something for pain.  Past Medical History  Diagnosis Date  . Anemia   . Depression     Past Surgical History  Procedure Laterality Date  . Abdominal hysterectomy    . Gastric bypass      History reviewed. No pertinent family history.  Social History:  reports that she has never smoked. She does not have any smokeless tobacco history on file. She reports that she drinks alcohol. She reports that she does not use illicit drugs.  Allergies:  Allergies  Allergen Reactions  . Cymbalta [Duloxetine Hcl] Rash  . Penicillins Rash    Has patient had a PCN reaction causing immediate rash, facial/tongue/throat swelling, SOB or lightheadedness with hypotension: No Has patient had a PCN reaction causing severe rash involving mucus membranes or skin necrosis: No Has patient had a PCN reaction that required hospitalization No Has patient had a PCN reaction occurring within the last 10 years: No If all of the above answers are "NO", then may proceed with Cephalosporin use.     Medications: I have reviewed the patient's current medications.  Results for orders placed or performed during the hospital encounter of 06/16/15 (from the past 48 hour(s))  CBC with Differential/Platelet     Status: Abnormal   Collection Time: 06/16/15  6:25 PM  Result Value Ref Range   WBC 8.2 4.0 - 10.5 K/uL   RBC 3.94 3.87 - 5.11  MIL/uL   Hemoglobin 9.7 (L) 12.0 - 15.0 g/dL   HCT 31.0 (L) 36.0 - 46.0 %   MCV 78.7 78.0 - 100.0 fL   MCH 24.6 (L) 26.0 - 34.0 pg   MCHC 31.3 30.0 - 36.0 g/dL   RDW 16.4 (H) 11.5 - 15.5 %   Platelets 377 150 - 400 K/uL   Neutrophils Relative % 53 %   Neutro Abs 4.4 1.7 - 7.7 K/uL   Lymphocytes Relative 34 %   Lymphs Abs 2.8 0.7 - 4.0 K/uL   Monocytes Relative 8 %   Monocytes Absolute 0.7 0.1 - 1.0 K/uL   Eosinophils Relative 4 %   Eosinophils Absolute 0.3 0.0 - 0.7 K/uL   Basophils Relative 1 %   Basophils Absolute 0.0 0.0 - 0.1 K/uL  Comprehensive metabolic panel     Status: Abnormal   Collection Time: 06/16/15  6:25 PM  Result Value Ref Range   Sodium 136 135 - 145 mmol/L   Potassium 4.1 3.5 - 5.1 mmol/L   Chloride 102 101 - 111 mmol/L   CO2 25 22 - 32 mmol/L   Glucose, Bld 86 65 - 99 mg/dL   BUN 9 6 - 20 mg/dL   Creatinine, Ser 0.58 0.44 - 1.00 mg/dL   Calcium 9.2 8.9 - 10.3 mg/dL   Total Protein 8.3 (H) 6.5 - 8.1 g/dL   Albumin 3.8 3.5 - 5.0 g/dL   AST 15 15 - 41 U/L   ALT 11 (L) 14 - 54 U/L  Alkaline Phosphatase 123 38 - 126 U/L   Total Bilirubin 0.3 0.3 - 1.2 mg/dL   GFR calc non Af Amer >60 >60 mL/min   GFR calc Af Amer >60 >60 mL/min    Comment: (NOTE) The eGFR has been calculated using the CKD EPI equation. This calculation has not been validated in all clinical situations. eGFR's persistently <60 mL/min signify possible Chronic Kidney Disease.    Anion gap 9 5 - 15    Ct Abdomen Pelvis W Contrast  06/16/2015  CLINICAL DATA:  Patient with increased pain in the right lower quadrant. Patient with percutaneous drainage catheter for ruptured appendicitis. EXAM: CT ABDOMEN AND PELVIS WITH CONTRAST TECHNIQUE: Multidetector CT imaging of the abdomen and pelvis was performed using the standard protocol following bolus administration of intravenous contrast. CONTRAST:  161m OMNIPAQUE IOHEXOL 300 MG/ML  SOLN COMPARISON:  CT abdomen pelvis 04/16/2015; 05/06/2015.  FINDINGS: Lower chest: Normal heart size. Dependent atelectasis. There is a 5 mm right lower lobe pulmonary nodule (image 1; series 2). Dependent atelectasis. No pleural. Hepatobiliary: Liver is normal in size and contour. No focal hepatic lesion is identified. Gallbladder is unremarkable. Intrahepatic or extrahepatic biliary ductal dilatation. Pancreas: Unremarkable Spleen: Unremarkable Adrenals/Urinary Tract: The adrenal glands are normal. The kidneys enhance symmetrically with contrast. Urinary bladder is unremarkable. No hydronephrosis. Stomach/Bowel: No abnormal bowel wall thickening or evidence for bowel obstruction. Status post gastric bypass procedure. Vascular/Lymphatic: Normal caliber abdominal aorta. No retroperitoneal lymphadenopathy. Prominent right lower quadrant mesenteric lymph node measuring up to 13 mm, likely reactive in etiology. Other: Within the right lower quadrant there is a 3.5 x 4.2 cm rim enhancing fluid collection containing a small appendicolith. This fluid collection is increased in size from prior examination where it measured 2.7 x 3.7 cm. The right anterior lateral abdominal wall approach percutaneous drainage catheter is present however the tip appears to be coiled along the inferior margin of abscess. It is unclear, given the location of the tip, if the abscess is still being drained by this percutaneous drainage catheter. Uterus is surgically absent. Musculoskeletal: There is now irregular enhancement involving the right iliopsoas muscle (image 53; series 2). Small fat containing ventral abdominal wall hernia (image 71; series 2). No aggressive or acute appearing osseous lesions. Lumbar spine degenerative changes. IMPRESSION: Interval increase in size of peripheral enhancing right lower quadrant fluid collection compatible with abscess. The tip of the percutaneous drainage catheter is coiled along the inferior margin of the fluid collection. Is unclear if this catheter is  currently draining the fluid collection given positioning. There is now irregular enhancement along the lateral margin of the right iliopsoas muscle concerning for extension of infectious process. Intramuscular abscess is not excluded. Multiple prominent mesenteric lymph nodes within the right lower quadrant most compatible with reactive adenopathy. 5 mm right lower lobe pulmonary nodule. If the patient is at high risk for bronchogenic carcinoma, follow-up chest CT at 6-12 months is recommended. If the patient is at low risk for bronchogenic carcinoma, follow-up chest CT at 12 months is recommended. This recommendation follows the consensus statement: Guidelines for Management of Small Pulmonary Nodules Detected on CT Scans: A Statement from the [place] published in Radiology 2005;237:395-400. Electronically Signed   By: DLovey NewcomerM.D.   On: 06/16/2015 20:26    Review of Systems  Constitutional: Positive for fever. Negative for chills.  HENT: Negative.   Eyes: Negative.   Respiratory: Negative.   Cardiovascular: Negative.   Gastrointestinal: Positive for abdominal pain (RLQ).  Genitourinary: Negative.   Musculoskeletal: Negative.   Skin: Negative.   Neurological: Positive for weakness.  Endo/Heme/Allergies: Negative.   Psychiatric/Behavioral: Negative.    Blood pressure 131/88, pulse 98, temperature 98.2 F (36.8 C), temperature source Oral, resp. rate 18, SpO2 98 %. Physical Exam  Constitutional: She is oriented to person, place, and time. She appears well-developed and well-nourished. No distress.  HENT:  Head: Normocephalic and atraumatic.  Right Ear: External ear normal.  Left Ear: External ear normal.  Eyes: Conjunctivae are normal. Pupils are equal, round, and reactive to light. No scleral icterus.  Neck: Normal range of motion. Neck supple. No tracheal deviation present. No thyromegaly present.  Cardiovascular: Normal rate, regular rhythm and normal heart sounds.    No murmur heard. Respiratory: Effort normal and breath sounds normal. No respiratory distress. She has no wheezes.  GI: Soft. Bowel sounds are normal. She exhibits no distension and no mass. There is tenderness (RLQ at site of perc drain; cloudy fluid in drain). There is no rebound and no guarding.  Musculoskeletal: Normal range of motion. She exhibits no edema.  Neurological: She is alert and oriented to person, place, and time.  Skin: Skin is warm and dry.  Psychiatric: She has a normal mood and affect. Her behavior is normal.    Assessment/Plan: Perforated appendicitis with abscess  Will start patient on Cipro & Flagyl  Will give Rx for Oxy IR at patient request  Patient to be NPO in AM 11/23 for possible procedure by IR tomorrow  Will have [facility] physician or PA speak with IR at [facility] in the morning - possible procedure later in the day  Patient and husband have legal proceeding in court tomorrow at 9 AM  Patient scheduled to see Dr. Donne [name] at [place] office early next week  Tina Regal, MD, [facility] Office: [place] 06/16/2015, 9:05 PM

## 2015-06-16 NOTE — Progress Notes (Signed)
Pt confirmed with CM that her pcp is Dr Cipriano Mile at [facility] and surgeon is Dr Donne [name]

## 2015-06-17 ENCOUNTER — Ambulatory Visit (HOSPITAL_COMMUNITY)
Admission: RE | Admit: 2015-06-17 | Discharge: 2015-06-17 | Disposition: A | Discharge status: Home / Self Care | Payer: Commercial Managed Care - HMO | Source: Ambulatory Visit | Attending: General Surgery | Admitting: General Surgery

## 2015-06-17 ENCOUNTER — Other Ambulatory Visit (HOSPITAL_COMMUNITY): Payer: Self-pay | Admitting: General Surgery

## 2015-06-17 DIAGNOSIS — K3532 Acute appendicitis with perforation and localized peritonitis, without abscess: Secondary | ICD-10-CM

## 2015-06-17 DIAGNOSIS — Z4682 Encounter for fitting and adjustment of non-vascular catheter: Secondary | ICD-10-CM | POA: Diagnosis present

## 2015-06-17 DIAGNOSIS — K353 Acute appendicitis with localized peritonitis: Secondary | ICD-10-CM | POA: Diagnosis not present

## 2015-06-17 MED ORDER — CHLORHEXIDINE GLUCONATE 4 % EX LIQD
CUTANEOUS | Status: AC
  Filled 2015-06-17: qty 15

## 2015-06-17 MED ORDER — IOHEXOL 300 MG/ML  SOLN
50.0000 mL | Freq: Once | INTRAMUSCULAR | Status: DC | PRN
  Administered 2015-06-17: 10 mL via INTRAVENOUS
  Filled 2015-06-17: qty 50

## 2015-06-17 MED ORDER — LIDOCAINE HCL 1 % IJ SOLN
INTRAMUSCULAR | Status: AC
  Filled 2015-06-17: qty 20

## 2015-06-17 NOTE — Procedures (Signed)
RLQ abscess drain exchange 12 Fr No comp/EBL

## 2015-06-23 ENCOUNTER — Other Ambulatory Visit: Payer: Self-pay | Admitting: General Surgery

## 2015-06-23 DIAGNOSIS — K3533 Acute appendicitis with perforation and localized peritonitis, with abscess: Secondary | ICD-10-CM

## 2015-06-25 ENCOUNTER — Other Ambulatory Visit (HOSPITAL_COMMUNITY): Payer: Self-pay | Admitting: Interventional Radiology

## 2015-06-25 ENCOUNTER — Other Ambulatory Visit: Payer: Self-pay | Admitting: General Surgery

## 2015-06-25 DIAGNOSIS — K3533 Acute appendicitis with perforation and localized peritonitis, with abscess: Secondary | ICD-10-CM

## 2015-07-02 ENCOUNTER — Ambulatory Visit
Admission: RE | Admit: 2015-07-02 | Discharge: 2015-07-02 | Disposition: A | Discharge status: Home / Self Care | Payer: Commercial Managed Care - HMO | Source: Ambulatory Visit | Attending: Interventional Radiology | Admitting: Interventional Radiology

## 2015-07-02 ENCOUNTER — Ambulatory Visit
Admission: RE | Admit: 2015-07-02 | Discharge: 2015-07-02 | Disposition: A | Discharge status: Home / Self Care | Payer: Commercial Managed Care - HMO | Source: Ambulatory Visit | Attending: General Surgery | Admitting: General Surgery

## 2015-07-02 DIAGNOSIS — K3533 Acute appendicitis with perforation and localized peritonitis, with abscess: Secondary | ICD-10-CM

## 2015-07-02 MED ORDER — IOPAMIDOL (ISOVUE-300) INJECTION 61%
100.0000 mL | Freq: Once | INTRAVENOUS | Status: AC | PRN
  Administered 2015-07-02: 100 mL via INTRAVENOUS

## 2015-07-02 NOTE — Progress Notes (Signed)
Patient ID: [name], female   DOB: 01-21-1966, 49 y.o.   MRN: [id number]       Chief Complaint: Patient was seen in consultation today for apendiceal abscess drainage catheter at the request of Dr. Donne [name]  Referring Physician(s): Rolm Bookbinder  History of Present Illness: [name] is a 49 y.o. female with history of acute appendicitis with abscess formation who initially underwent CT-guided pertinent drainage catheter placement on 04/03/2015.   The patient is well-known to the interventional radiology service having undergone several fluoroscopic guided percutaneous drainage catheter exchanges and upsizing, most recently performed on 06/17/2015, performed after the patient presented to the emergency department with worsening right lower quadrant abdominal pain where CT scan performed day prior demonstrated interval increase in the size of the periappendiceal fluid collection.  Since that time, the patient states that her right lower quadrant pain has resolved. The patient continues to flush the particular cutaneous drainage catheter daily. The patient reports minimal to no output from the percutaneous drain.  The patient returns today to the interventional radiology clinic for review of preceding abdomen and pelvis CT scan, percutaneous drainage catheter injection and management.  Past Medical History  Diagnosis Date  . Anemia   . Depression     Past Surgical History  Procedure Laterality Date  . Abdominal hysterectomy    . Gastric bypass      Allergies: Cymbalta and Penicillins  Medications: Prior to Admission medications   Medication Sig Start Date End Date Taking? Authorizing Provider  acetaminophen (TYLENOL) 500 MG tablet Take 1,000 mg by mouth every 6 (six) hours as needed for fever.    Historical Provider, MD  ciprofloxacin (CIPRO) 500 MG tablet Take 1 tablet (500 mg total) by mouth 2 (two) times daily. Patient not taking: Reported on 05/06/2015 04/07/15    Saverio Danker, PA-C  clindamycin (CLEOCIN) 300 MG capsule Take 1 capsule (300 mg total) by mouth every 8 (eight) hours. Patient not taking: Reported on 05/06/2015 04/07/15   Saverio Danker, PA-C  cyclobenzaprine (FLEXERIL) 10 MG tablet Take 10 mg by mouth 3 (three) times daily as needed for muscle spasms.    Historical Provider, MD  escitalopram (LEXAPRO) 10 MG tablet Take 10 mg by mouth daily.    Historical Provider, MD  ferrous sulfate 325 (65 FE) MG tablet Take 325 mg by mouth 2 (two) times daily with a meal.    Historical Provider, MD  oxyCODONE (OXY IR/ROXICODONE) 5 MG immediate release tablet Take 1-2 tablets (5-10 mg total) by mouth every 4 (four) hours as needed for moderate pain. 04/07/15   Saverio Danker, PA-C  Sodium Chloride Flush (NORMAL SALINE FLUSH) 0.9 % SOLN Inject 8 mLs into the vein daily. Uses to flush drain 05/30/15   Historical Provider, MD  traMADol (ULTRAM) 50 MG tablet Take 50 mg by mouth every 6 (six) hours as needed for moderate pain.    Historical Provider, MD  vitamin B-12 (CYANOCOBALAMIN) 1000 MCG tablet Take 500 mcg by mouth daily.    Historical Provider, MD  vitamin C (ASCORBIC ACID) 500 MG tablet Take 500 mg by mouth daily.    Historical Provider, MD     No family history on file.  Social History   Social History  . Marital Status: Single    Spouse Name: N/A  . Number of Children: N/A  . Years of Education: N/A   Social History Main Topics  . Smoking status: Never Smoker   . Smokeless tobacco: Not on file  . Alcohol  Use: Yes  . Drug Use: No  . Sexual Activity: Not on file   Other Topics Concern  . Not on file   Social History Narrative    ECOG Status: 1 - Symptomatic but completely ambulatory  Review of Systems: A 12 point ROS discussed and pertinent positives are indicated in the HPI above.  All other systems are negative.  Review of Systems  Constitutional: Negative for fever, appetite change and fatigue.  Gastrointestinal: Negative for  abdominal pain.    Vital Signs: BP 143/80 mmHg  Pulse 68  Temp(Src) 98.1 F (36.7 C) (Oral)  SpO2 100%  Physical Exam  Constitutional: She appears well-developed and well-nourished.  Abdominal: Soft. There is no guarding.    Location of the patient's appendiceal abscess drainage catheter.  Nursing note and vitals reviewed.   Mallampati Score:     Imaging: Ct Abdomen Pelvis W Contrast  06/16/2015  CLINICAL DATA:  Patient with increased pain in the right lower quadrant. Patient with percutaneous drainage catheter for ruptured appendicitis. EXAM: CT ABDOMEN AND PELVIS WITH CONTRAST TECHNIQUE: Multidetector CT imaging of the abdomen and pelvis was performed using the standard protocol following bolus administration of intravenous contrast. CONTRAST:  18mL OMNIPAQUE IOHEXOL 300 MG/ML  SOLN COMPARISON:  CT abdomen pelvis 04/16/2015; 05/06/2015. FINDINGS: Lower chest: Normal heart size. Dependent atelectasis. There is a 5 mm right lower lobe pulmonary nodule (image 1; series 2). Dependent atelectasis. No pleural. Hepatobiliary: Liver is normal in size and contour. No focal hepatic lesion is identified. Gallbladder is unremarkable. Intrahepatic or extrahepatic biliary ductal dilatation. Pancreas: Unremarkable Spleen: Unremarkable Adrenals/Urinary Tract: The adrenal glands are normal. The kidneys enhance symmetrically with contrast. Urinary bladder is unremarkable. No hydronephrosis. Stomach/Bowel: No abnormal bowel wall thickening or evidence for bowel obstruction. Status post gastric bypass procedure. Vascular/Lymphatic: Normal caliber abdominal aorta. No retroperitoneal lymphadenopathy. Prominent right lower quadrant mesenteric lymph node measuring up to 13 mm, likely reactive in etiology. Other: Within the right lower quadrant there is a 3.5 x 4.2 cm rim enhancing fluid collection containing a small appendicolith. This fluid collection is increased in size from prior examination where it  measured 2.7 x 3.7 cm. The right anterior lateral abdominal wall approach percutaneous drainage catheter is present however the tip appears to be coiled along the inferior margin of abscess. It is unclear, given the location of the tip, if the abscess is still being drained by this percutaneous drainage catheter. Uterus is surgically absent. Musculoskeletal: There is now irregular enhancement involving the right iliopsoas muscle (image 53; series 2). Small fat containing ventral abdominal wall hernia (image 71; series 2). No aggressive or acute appearing osseous lesions. Lumbar spine degenerative changes. IMPRESSION: Interval increase in size of peripheral enhancing right lower quadrant fluid collection compatible with abscess. The tip of the percutaneous drainage catheter is coiled along the inferior margin of the fluid collection. Is unclear if this catheter is currently draining the fluid collection given positioning. There is now irregular enhancement along the lateral margin of the right iliopsoas muscle concerning for extension of infectious process. Intramuscular abscess is not excluded. Multiple prominent mesenteric lymph nodes within the right lower quadrant most compatible with reactive adenopathy. 5 mm right lower lobe pulmonary nodule. If the patient is at high risk for bronchogenic carcinoma, follow-up chest CT at 6-12 months is recommended. If the patient is at low risk for bronchogenic carcinoma, follow-up chest CT at 12 months is recommended. This recommendation follows the consensus statement: Guidelines for Management of Small Pulmonary Nodules  Detected on CT Scans: A Statement from the [employer] as published in Radiology 2005;237:395-400. Electronically Signed   By: Lovey Newcomer M.D.   On: 06/16/2015 20:26   Ir Sinus/fist Tube Chk-non Gi  06/17/2015  CLINICAL DATA:  Repositioning of abscess drain EXAM: SINUS TRACT INJECTION/FISTULOGRAM FLUOROSCOPY TIME:  One minutes MEDICATIONS AND  MEDICAL HISTORY: None ANESTHESIA/SEDATION: None CONTRAST:  10 cc Omnipaque 300 PROCEDURE: The procedure, risks, benefits, and alternatives were explained to the patient. Questions regarding the procedure were encouraged and answered. The patient understands and consents to the procedure. The right lower quadrant was prepped with Betadine in a sterile fashion, and a sterile drape was applied covering the operative field. A sterile gown and sterile gloves were used for the procedure. 1% lidocaine was utilized for local infiltration. The existing drain was injected with contrast. It was then cut and exchanged over a Bentson wire for a Kumpe the catheter. The Kumpe be catheter was advanced over the Bentson wire into the adjacent abscess. The copy was exchanged for a new 12 [place] drain which was looped and string fixed in the abscess cavity. Contrast was injected. FINDINGS: Images demonstrate replacement and exchange of the existing drain for a new 12 [place] drain. The new drain is placed superior to the original location within the adjacent abscess. COMPLICATIONS: None IMPRESSION: Successful right lower quadrant abscess drain exchange. The drain was repositioned in the more superior adjacent abscess. Electronically Signed   By: Marybelle Killings M.D.   On: 06/17/2015 16:59   Ir Catheter Tube Change  06/03/2015  CLINICAL DATA:  History of appendiceal abscess post CT-guided percutaneous drainage catheter placement x2 (initially performed on 04/03/2015). Patient underwent successful fluoroscopic guided percutaneous drainage catheter exchange and up sizing on 04/17/2015. The cranially positioned percutaneous drainage catheter was removed on 04/29/2015. 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 Patient was referred of by providing surgeon, Dr. Donne [name], for repeat percutaneous drainage catheter injection and retraction in hopes of encouraging healing of the patient's persistent enteric perforation/fistula. The patient continues to report minimal to no output from her percutaneous drainage catheter. She continues to flush the catheter with 8 cc of saline 1 time per day. The percutaneous drainage catheter remains connected to a JP bulb. EXAM: IR CATHETER TUBE CHANGE COMPARISON:  CT abdomen pelvis and Fluoroscopic guided percutaneous drainage catheter injection - 05/06/2015 CONTRAST:  14mL OMNIPAQUE IOHEXOL 300 MG/ML SOLN - administered via the existing percutaneous drainage catheter FLUOROSCOPY TIME:  1 minute, 36 seconds (46.4 mGy) TECHNIQUE: The patient was positioned supine on fluoroscopy table. External portion of the existing right lower quadrant percutaneous drainage catheter as well as the surrounding skin were prepped and draped in usual sterile fashion. A time-out was performed prior to initiation of the procedure. A preprocedural spot fluoroscopic image was obtained of the right lower abdominal quadrant. Multiple spot fluoroscopic images were obtained following the injection of a small amount of contrast via the existing percutaneous drainage catheter. The existing percutaneous drainage catheter was cut and cannulated with a Bentson wire and exchanged for a new 12 French percutaneous drainage catheter. The new percutaneous drainage catheter was slightly retracted and then coiled/locked in place at a slightly more peripheral positioning. Contrast injection demonstrated passage of contrast along a catheter tract. The percutaneous drainage catheter was secured at the skin  entrance site within interrupted suture and a Stat Lock device. A dressing was placed. The percutaneous drainage catheter was reconnected to a JP bulb. 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 After fluoroscopic guided exchange, the new 12 French percutaneous drainage catheter and is coiled and locked in a slightly more peripheral location. Post exchange catheter injection demonstrates passage of contrast along the catheter tract. IMPRESSION: Technically successful fluoroscopic guided percutaneous drainage catheter exchange and repositioning with end now coiled and locked slightly more peripheral to original drain's location. PLAN: The patient was encouraged to keep all subsequent appointments with Dr. Donne [name] regarding her ultimate definitive operative intervention. Electronically Signed   By: Sandi Mariscal M.D.   On: 06/03/2015 12:58    Labs:  CBC:  Recent Labs  04/03/15 0425 04/04/15 0401 04/17/15 1534 06/16/15 1825  WBC 11.7* 7.3 5.8 8.2  HGB 7.8* 7.4* 8.7* 9.7*  HCT 25.0* 23.7* 28.6* 31.0*  PLT 247 268 439* 377    COAGS:  Recent Labs  04/02/15 1110  INR 1.48  APTT 38*    BMP:  Recent Labs  04/03/15 0425 04/04/15 0401 04/17/15 1534 06/16/15 1825  NA 133* 134* 136 136  K 3.7 3.4* 4.3 4.1  CL 102 104 101 102  CO2 22 21* 27 25  GLUCOSE 100* 124* 96 86  BUN <5* <5* 13 9  CALCIUM 8.7* 8.5* 9.8 9.2  CREATININE 0.51 0.50 0.60 0.58  GFRNONAA >60 >60 >60 >60  GFRAA >60 >60 >60 >60    LIVER FUNCTION TESTS:  Recent Labs  04/01/15 2140 06/16/15 1825  BILITOT 0.8 0.3  AST 20 15  ALT 16 11*  ALKPHOS 107 123  PROT 8.0 8.3*  ALBUMIN 3.6 3.8   Assessment and Plan:  [name] is a 49 y.o. female with history of acute appendicitis with abscess formation who initially underwent CT-guided pertinent drainage catheter placement on 04/03/2015.   The patient reports resolution of her right lower quadrant abdominal pain following successful fluoroscopic guided repositioning performed 06/17/2015. The patient continues to flush the drainage catheter  daily. The patient reports minimal to no output from the percutaneous drain.  Review of CT scan performed earlier today demonstrates grossly unchanged size and appearance of residual periappendiceal fluid collection with dominant component measuring approximately 4.9 cm in diameter. The ill-defined area of peripheral enhancement within the lateral margin of the right iliopsoas musculature also appears grossly unchanged and is again worrisome for intramuscular extension of the adjacent inflammatory/infectious process.  Plan of care was discussed with referring physician Dr. 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  Unfortunately, the fluoroscopic guided injection of the drainage catheter performed today demonstrates persistent wide patency of a fistulous connection with the adjacent cecum. These results were discussed with the patient in detail and she was encouraged to contact Dr. Cristal Generous office to ensure operative intervention is scheduled.  A copy of this report was sent to the requesting provider on this date.  SignedSandi Mariscal 07/02/2015, 2:27 PM   I spent a total of 15 Minutes in face to face in clinical consultation, greater than 50% of which was counseling/coordinating care for appendiceal abscess drainage catheter

## 2015-07-03 ENCOUNTER — Other Ambulatory Visit: Payer: Self-pay | Admitting: General Surgery

## 2015-07-03 MED ORDER — GENTAMICIN SULFATE 40 MG/ML IJ SOLN: 5.0000 mg/kg | INTRAVENOUS | Status: DC

## 2015-07-03 MED ORDER — DEXTROSE 5 % IV SOLN: 900.0000 mg | INTRAVENOUS | Status: DC

## 2015-07-29 ENCOUNTER — Encounter (HOSPITAL_COMMUNITY)
Admission: RE | Admit: 2015-07-29 | Discharge: 2015-07-29 | Disposition: A | Discharge status: Home / Self Care | Payer: Commercial Managed Care - HMO | Source: Ambulatory Visit | Attending: General Surgery | Admitting: General Surgery

## 2015-07-29 ENCOUNTER — Encounter (HOSPITAL_COMMUNITY): Payer: Self-pay

## 2015-07-29 DIAGNOSIS — K353 Acute appendicitis with localized peritonitis: Secondary | ICD-10-CM | POA: Diagnosis not present

## 2015-07-29 DIAGNOSIS — Z01812 Encounter for preprocedural laboratory examination: Secondary | ICD-10-CM | POA: Insufficient documentation

## 2015-07-29 HISTORY — DX: Hemorrhage due to other internal prosthetic devices, implants and grafts, initial encounter: T85.838A

## 2015-07-29 HISTORY — DX: Anxiety disorder, unspecified: F41.9

## 2015-07-29 LAB — CBC WITH DIFFERENTIAL/PLATELET
BASOS ABS: 0.1 10*3/uL (ref 0.0–0.1)
BASOS PCT: 1 %
EOS ABS: 0.1 10*3/uL (ref 0.0–0.7)
EOS PCT: 2 %
HCT: 33.7 % — ABNORMAL LOW (ref 36.0–46.0)
Hemoglobin: 10.4 g/dL — ABNORMAL LOW (ref 12.0–15.0)
Lymphocytes Relative: 31 %
Lymphs Abs: 1.7 10*3/uL (ref 0.7–4.0)
MCH: 24 pg — ABNORMAL LOW (ref 26.0–34.0)
MCHC: 30.9 g/dL (ref 30.0–36.0)
MCV: 77.8 fL — ABNORMAL LOW (ref 78.0–100.0)
MONO ABS: 0.3 10*3/uL (ref 0.1–1.0)
Monocytes Relative: 5 %
NEUTROS ABS: 3.4 10*3/uL (ref 1.7–7.7)
Neutrophils Relative %: 61 %
PLATELETS: 259 10*3/uL (ref 150–400)
RBC: 4.33 MIL/uL (ref 3.87–5.11)
RDW: 14.3 % (ref 11.5–15.5)
WBC: 5.5 10*3/uL (ref 4.0–10.5)

## 2015-07-29 LAB — COMPREHENSIVE METABOLIC PANEL
ALBUMIN: 3.5 g/dL (ref 3.5–5.0)
ALK PHOS: 87 U/L (ref 38–126)
ALT: 17 U/L (ref 14–54)
AST: 22 U/L (ref 15–41)
Anion gap: 9 (ref 5–15)
BILIRUBIN TOTAL: 0.3 mg/dL (ref 0.3–1.2)
BUN: 7 mg/dL (ref 6–20)
CALCIUM: 9.1 mg/dL (ref 8.9–10.3)
CO2: 25 mmol/L (ref 22–32)
CREATININE: 0.46 mg/dL (ref 0.44–1.00)
Chloride: 106 mmol/L (ref 101–111)
GFR calc Af Amer: >60 mL/min (ref >60)
GLUCOSE: 86 mg/dL (ref 65–99)
Potassium: 4.4 mmol/L (ref 3.5–5.1)
SODIUM: 140 mmol/L (ref 135–145)
TOTAL PROTEIN: 6.8 g/dL (ref 6.5–8.1)

## 2015-07-29 MED ORDER — CHLORHEXIDINE GLUCONATE CLOTH 2 % EX PADS: 6.0000 | MEDICATED_PAD | Freq: Once | CUTANEOUS | Status: DC

## 2015-07-29 NOTE — Pre-Procedure Instructions (Signed)
    [name]  07/29/2015      [id number] - [name], [place] [name] Phone: [phone] Fax: [phone]  [facility] [name], [name] Phone: [phone] Fax: [phone]    Your procedure is scheduled on 08/03/15.  Report to [facility] Admitting at 1045 A.M.  Call this number if you have problems the morning of surgery:  [phone]   Remember:  Do not eat food or drink liquids after midnight.  Take these medicines the morning of surgery with A SIP OF WATER --tylonol,lexapro,oxydodone,ultram   Do not wear jewelry, make-up or nail polish.  Do not wear lotions, powders, or perfumes.  You may wear deodorant.  Do not shave 48 hours prior to surgery.  Men may shave face and neck.  Do not bring valuables to the hospital.  [facility] is not responsible for any belongings or valuables.  Contacts, dentures or bridgework may not be worn into surgery.  Leave your suitcase in the car.  After surgery it may be brought to your room.  For patients admitted to the hospital, discharge time will be determined by your treatment team.  Patients discharged the day of surgery will not be allowed to drive home.   Name and phone number of your driver:   Special instructions:    Please read over the following fact sheets that you were given. Pain Booklet, Coughing and Deep Breathing and Surgical Site Infection Prevention

## 2015-07-30 LAB — HEMOGLOBIN A1C
HEMOGLOBIN A1C: 5.7 % — AB (ref 4.8–5.6)
Mean Plasma Glucose: 117 mg/dL

## 2015-07-30 LAB — CEA: CEA: 2.8 ng/mL (ref 0.0–4.7)

## 2015-08-02 MED ORDER — NEOMYCIN SULFATE 500 MG PO TABS
1000.0000 mg | ORAL_TABLET | ORAL | Status: DC
  Filled 2015-08-02: qty 2

## 2015-08-02 MED ORDER — ERYTHROMYCIN BASE 250 MG PO TABS
1000.0000 mg | ORAL_TABLET | ORAL | Status: DC
  Filled 2015-08-02: qty 4

## 2015-08-02 MED ORDER — HEPARIN SODIUM (PORCINE) 5000 UNIT/ML IJ SOLN: 5000.0000 [IU] | Freq: Once | INTRAMUSCULAR | Status: DC

## 2015-08-03 ENCOUNTER — Inpatient Hospital Stay (HOSPITAL_COMMUNITY): Payer: Commercial Managed Care - HMO | Admitting: Certified Registered Nurse Anesthetist

## 2015-08-03 ENCOUNTER — Inpatient Hospital Stay (HOSPITAL_COMMUNITY)
Admission: RE | Admit: 2015-08-03 | Discharge: 2015-08-11 | DRG: 329 | Disposition: A | Discharge status: Home Health | Payer: Commercial Managed Care - HMO | Source: Ambulatory Visit | Attending: General Surgery | Admitting: General Surgery

## 2015-08-03 ENCOUNTER — Encounter (HOSPITAL_COMMUNITY): Payer: Self-pay | Admitting: Certified Registered Nurse Anesthetist

## 2015-08-03 ENCOUNTER — Encounter (HOSPITAL_COMMUNITY)
Admission: RE | Disposition: A | Discharge status: Home Health | Payer: Self-pay | Source: Ambulatory Visit | Attending: General Surgery

## 2015-08-03 DIAGNOSIS — C189 Malignant neoplasm of colon, unspecified: Secondary | ICD-10-CM | POA: Diagnosis present

## 2015-08-03 DIAGNOSIS — K631 Perforation of intestine (nontraumatic): Secondary | ICD-10-CM | POA: Diagnosis present

## 2015-08-03 DIAGNOSIS — Z9884 Bariatric surgery status: Secondary | ICD-10-CM | POA: Diagnosis not present

## 2015-08-03 DIAGNOSIS — K651 Peritoneal abscess: Secondary | ICD-10-CM | POA: Diagnosis present

## 2015-08-03 DIAGNOSIS — R509 Fever, unspecified: Secondary | ICD-10-CM

## 2015-08-03 DIAGNOSIS — K567 Ileus, unspecified: Secondary | ICD-10-CM | POA: Diagnosis not present

## 2015-08-03 DIAGNOSIS — M797 Fibromyalgia: Secondary | ICD-10-CM | POA: Diagnosis present

## 2015-08-03 DIAGNOSIS — K660 Peritoneal adhesions (postprocedural) (postinfection): Secondary | ICD-10-CM | POA: Diagnosis present

## 2015-08-03 DIAGNOSIS — F419 Anxiety disorder, unspecified: Secondary | ICD-10-CM | POA: Diagnosis present

## 2015-08-03 DIAGNOSIS — Z9049 Acquired absence of other specified parts of digestive tract: Secondary | ICD-10-CM

## 2015-08-03 DIAGNOSIS — C772 Secondary and unspecified malignant neoplasm of intra-abdominal lymph nodes: Secondary | ICD-10-CM | POA: Diagnosis present

## 2015-08-03 DIAGNOSIS — K353 Acute appendicitis with localized peritonitis: Secondary | ICD-10-CM | POA: Diagnosis present

## 2015-08-03 HISTORY — PX: LAPAROSCOPIC ILEOCECECTOMY: SHX5898

## 2015-08-03 HISTORY — PX: COLON SURGERY: SHX602

## 2015-08-03 HISTORY — DX: Gastro-esophageal reflux disease without esophagitis: K21.9

## 2015-08-03 HISTORY — PX: COLOSTOMY REVISION: SHX5232

## 2015-08-03 SURGERY — EXCISION, CECUM WITH ILEUM, LAPAROSCOPIC
Anesthesia: General | Site: Abdomen | Laterality: Right

## 2015-08-03 MED ORDER — ALUM & MAG HYDROXIDE-SIMETH 200-200-20 MG/5ML PO SUSP: 30.0000 mL | Freq: Four times a day (QID) | ORAL | Status: DC | PRN

## 2015-08-03 MED ORDER — ACETAMINOPHEN 325 MG PO TABS: 650.0000 mg | ORAL_TABLET | Freq: Four times a day (QID) | ORAL | Status: DC | PRN

## 2015-08-03 MED ORDER — SODIUM CHLORIDE 0.9 % IR SOLN
Status: DC | PRN
  Administered 2015-08-03: 1000 mL

## 2015-08-03 MED ORDER — MIDAZOLAM HCL 5 MG/5ML IJ SOLN
INTRAMUSCULAR | Status: DC | PRN
  Administered 2015-08-03: 2 mg via INTRAVENOUS

## 2015-08-03 MED ORDER — PHENYLEPHRINE HCL 10 MG/ML IJ SOLN
INTRAMUSCULAR | Status: DC | PRN
  Administered 2015-08-03: 80 ug via INTRAVENOUS
  Administered 2015-08-03: 40 ug via INTRAVENOUS
  Administered 2015-08-03: 80 ug via INTRAVENOUS

## 2015-08-03 MED ORDER — BUPIVACAINE-EPINEPHRINE (PF) 0.25% -1:200000 IJ SOLN
INTRAMUSCULAR | Status: AC
  Filled 2015-08-03: qty 30

## 2015-08-03 MED ORDER — HYDROMORPHONE HCL 1 MG/ML IJ SOLN
0.2500 mg | INTRAMUSCULAR | Status: DC | PRN
  Administered 2015-08-03 (×4): 0.5 mg via INTRAVENOUS

## 2015-08-03 MED ORDER — SODIUM CHLORIDE 0.9 % IJ SOLN: 9.0000 mL | INTRAMUSCULAR | Status: DC | PRN

## 2015-08-03 MED ORDER — BUPIVACAINE-EPINEPHRINE 0.25% -1:200000 IJ SOLN
INTRAMUSCULAR | Status: DC | PRN
  Administered 2015-08-03: 8 mL

## 2015-08-03 MED ORDER — ROCURONIUM BROMIDE 50 MG/5ML IV SOLN
INTRAVENOUS | Status: AC
  Filled 2015-08-03: qty 1

## 2015-08-03 MED ORDER — MORPHINE SULFATE 2 MG/ML IV SOLN
INTRAVENOUS | Status: AC
  Administered 2015-08-03: 2 mg via INTRAVENOUS
  Filled 2015-08-03: qty 25

## 2015-08-03 MED ORDER — FENTANYL CITRATE (PF) 100 MCG/2ML IJ SOLN
INTRAMUSCULAR | Status: DC | PRN
  Administered 2015-08-03 (×2): 50 ug via INTRAVENOUS
  Administered 2015-08-03: 100 ug via INTRAVENOUS
  Administered 2015-08-03: 50 ug via INTRAVENOUS

## 2015-08-03 MED ORDER — PROMETHAZINE HCL 25 MG/ML IJ SOLN
INTRAMUSCULAR | Status: AC
  Administered 2015-08-03: 6.25 mg via INTRAVENOUS
  Filled 2015-08-03: qty 1

## 2015-08-03 MED ORDER — ONDANSETRON HCL 4 MG PO TABS: 4.0000 mg | ORAL_TABLET | Freq: Four times a day (QID) | ORAL | Status: DC | PRN

## 2015-08-03 MED ORDER — PROMETHAZINE HCL 25 MG/ML IJ SOLN
6.2500 mg | INTRAMUSCULAR | Status: DC | PRN
  Administered 2015-08-03: 6.25 mg via INTRAVENOUS

## 2015-08-03 MED ORDER — NEOSTIGMINE METHYLSULFATE 10 MG/10ML IV SOLN
INTRAVENOUS | Status: DC | PRN
  Administered 2015-08-03: 3 mg via INTRAVENOUS

## 2015-08-03 MED ORDER — SODIUM CHLORIDE 0.9 % IV SOLN: INTRAVENOUS | Status: DC

## 2015-08-03 MED ORDER — ESCITALOPRAM OXALATE 10 MG PO TABS
10.0000 mg | ORAL_TABLET | Freq: Every day | ORAL | Status: DC
  Administered 2015-08-03 – 2015-08-11 (×9): 10 mg via ORAL
  Filled 2015-08-03 (×9): qty 1

## 2015-08-03 MED ORDER — PROPOFOL 10 MG/ML IV BOLUS
INTRAVENOUS | Status: DC | PRN
  Administered 2015-08-03: 130 mg via INTRAVENOUS

## 2015-08-03 MED ORDER — ONDANSETRON HCL 4 MG/2ML IJ SOLN
INTRAMUSCULAR | Status: AC
  Filled 2015-08-03: qty 2

## 2015-08-03 MED ORDER — CLINDAMYCIN PHOSPHATE 900 MG/50ML IV SOLN
INTRAVENOUS | Status: AC
  Filled 2015-08-03: qty 50

## 2015-08-03 MED ORDER — LIDOCAINE HCL (CARDIAC) 20 MG/ML IV SOLN
INTRAVENOUS | Status: DC | PRN
  Administered 2015-08-03: 60 mg via INTRAVENOUS

## 2015-08-03 MED ORDER — LACTATED RINGERS IV SOLN
INTRAVENOUS | Status: DC | PRN
  Administered 2015-08-03: 13:00:00 via INTRAVENOUS

## 2015-08-03 MED ORDER — NALOXONE HCL 0.4 MG/ML IJ SOLN: 0.4000 mg | INTRAMUSCULAR | Status: DC | PRN

## 2015-08-03 MED ORDER — LACTATED RINGERS IV SOLN: INTRAVENOUS | Status: DC

## 2015-08-03 MED ORDER — ALVIMOPAN 12 MG PO CAPS
ORAL_CAPSULE | ORAL | Status: AC
  Administered 2015-08-03: 12 mg via ORAL
  Filled 2015-08-03: qty 1

## 2015-08-03 MED ORDER — MEPERIDINE HCL 25 MG/ML IJ SOLN: 6.2500 mg | INTRAMUSCULAR | Status: DC | PRN

## 2015-08-03 MED ORDER — DIPHENHYDRAMINE HCL 50 MG/ML IJ SOLN: 12.5000 mg | Freq: Four times a day (QID) | INTRAMUSCULAR | Status: DC | PRN

## 2015-08-03 MED ORDER — FENTANYL CITRATE (PF) 250 MCG/5ML IJ SOLN
INTRAMUSCULAR | Status: AC
  Filled 2015-08-03: qty 5

## 2015-08-03 MED ORDER — HYDROMORPHONE HCL 1 MG/ML IJ SOLN
INTRAMUSCULAR | Status: AC
  Administered 2015-08-03: 0.5 mg via INTRAVENOUS
  Filled 2015-08-03: qty 1

## 2015-08-03 MED ORDER — ONDANSETRON HCL 4 MG/2ML IJ SOLN
4.0000 mg | Freq: Four times a day (QID) | INTRAMUSCULAR | Status: DC | PRN
  Administered 2015-08-03: 4 mg via INTRAVENOUS
  Filled 2015-08-03: qty 2

## 2015-08-03 MED ORDER — ALVIMOPAN 12 MG PO CAPS
12.0000 mg | ORAL_CAPSULE | Freq: Once | ORAL | Status: AC
  Administered 2015-08-03: 12 mg via ORAL

## 2015-08-03 MED ORDER — ONDANSETRON HCL 4 MG/2ML IJ SOLN
INTRAMUSCULAR | Status: DC | PRN
  Administered 2015-08-03: 4 mg via INTRAVENOUS

## 2015-08-03 MED ORDER — DIPHENHYDRAMINE HCL 12.5 MG/5ML PO ELIX
12.5000 mg | ORAL_SOLUTION | Freq: Four times a day (QID) | ORAL | Status: DC | PRN
  Administered 2015-08-08 – 2015-08-09 (×2): 12.5 mg via ORAL
  Filled 2015-08-03 (×2): qty 10

## 2015-08-03 MED ORDER — GLYCOPYRROLATE 0.2 MG/ML IJ SOLN
INTRAMUSCULAR | Status: DC | PRN
  Administered 2015-08-03: .4 mg via INTRAVENOUS

## 2015-08-03 MED ORDER — 0.9 % SODIUM CHLORIDE (POUR BTL) OPTIME
TOPICAL | Status: DC | PRN
  Administered 2015-08-03 (×2): 1000 mL

## 2015-08-03 MED ORDER — ONDANSETRON HCL 4 MG/2ML IJ SOLN: 4.0000 mg | Freq: Four times a day (QID) | INTRAMUSCULAR | Status: DC | PRN

## 2015-08-03 MED ORDER — MIDAZOLAM HCL 2 MG/2ML IJ SOLN
INTRAMUSCULAR | Status: AC
  Filled 2015-08-03: qty 2

## 2015-08-03 MED ORDER — LACTATED RINGERS IV SOLN
INTRAVENOUS | Status: DC
  Administered 2015-08-03: 12:00:00 via INTRAVENOUS

## 2015-08-03 MED ORDER — LIDOCAINE HCL (CARDIAC) 20 MG/ML IV SOLN
INTRAVENOUS | Status: AC
  Filled 2015-08-03: qty 5

## 2015-08-03 MED ORDER — MORPHINE SULFATE 2 MG/ML IV SOLN
INTRAVENOUS | Status: DC
  Administered 2015-08-03: 2 mg via INTRAVENOUS
  Administered 2015-08-03: 6 mg via INTRAVENOUS
  Administered 2015-08-04: 4 mg via INTRAVENOUS
  Administered 2015-08-04: 5 mg via INTRAVENOUS

## 2015-08-03 MED ORDER — ROCURONIUM BROMIDE 100 MG/10ML IV SOLN
INTRAVENOUS | Status: DC | PRN
  Administered 2015-08-03 (×2): 10 mg via INTRAVENOUS
  Administered 2015-08-03: 40 mg via INTRAVENOUS

## 2015-08-03 MED ORDER — ENOXAPARIN SODIUM 40 MG/0.4ML ~~LOC~~ SOLN
40.0000 mg | SUBCUTANEOUS | Status: DC
  Administered 2015-08-04 – 2015-08-08 (×5): 40 mg via SUBCUTANEOUS
  Filled 2015-08-03 (×9): qty 0.4

## 2015-08-03 MED ORDER — PROPOFOL 10 MG/ML IV BOLUS
INTRAVENOUS | Status: AC
  Filled 2015-08-03: qty 20

## 2015-08-03 MED ORDER — CIPROFLOXACIN IN D5W 400 MG/200ML IV SOLN
400.0000 mg | INTRAVENOUS | Status: AC
  Administered 2015-08-03: 400 mg via INTRAVENOUS
  Filled 2015-08-03: qty 200

## 2015-08-03 SURGICAL SUPPLY — 94 items
APPLIER CLIP 5 13 M/L LIGAMAX5 (MISCELLANEOUS)
BIOPATCH RED 1 DISK 7.0 (GAUZE/BANDAGES/DRESSINGS) ×3 IMPLANT
BIOPATCH RED 1IN DISK 7.0MM (GAUZE/BANDAGES/DRESSINGS) ×1
BLADE SURG ROTATE 9660 (MISCELLANEOUS) IMPLANT
CANISTER SUCTION 2500CC (MISCELLANEOUS) ×4 IMPLANT
CELLS DAT CNTRL 66122 CELL SVR (MISCELLANEOUS) IMPLANT
CHLORAPREP W/TINT 26ML (MISCELLANEOUS) ×4 IMPLANT
CLIP APPLIE 5 13 M/L LIGAMAX5 (MISCELLANEOUS) IMPLANT
COVER MAYO STAND STRL (DRAPES) ×8 IMPLANT
COVER SURGICAL LIGHT HANDLE (MISCELLANEOUS) ×8 IMPLANT
DRAIN CHANNEL 19F RND (DRAIN) ×4 IMPLANT
DRAPE LAPAROSCOPIC ABDOMINAL (DRAPES) ×4 IMPLANT
DRAPE PROXIMA HALF (DRAPES) ×8 IMPLANT
DRAPE UTILITY XL STRL (DRAPES) ×4 IMPLANT
DRAPE WARM FLUID 44X44 (DRAPE) ×4 IMPLANT
DRSG OPSITE POSTOP 4X10 (GAUZE/BANDAGES/DRESSINGS) IMPLANT
DRSG OPSITE POSTOP 4X8 (GAUZE/BANDAGES/DRESSINGS) ×4 IMPLANT
DRSG TEGADERM 2-3/8X2-3/4 SM (GAUZE/BANDAGES/DRESSINGS) ×12 IMPLANT
DRSG TEGADERM 4X4.75 (GAUZE/BANDAGES/DRESSINGS) ×4 IMPLANT
ELECT BLADE 6.5 EXT (BLADE) ×4 IMPLANT
ELECT CAUTERY BLADE 6.4 (BLADE) ×8 IMPLANT
ELECT REM PT RETURN 9FT ADLT (ELECTROSURGICAL) ×4
ELECTRODE REM PT RTRN 9FT ADLT (ELECTROSURGICAL) ×2 IMPLANT
EVACUATOR SILICONE 100CC (DRAIN) ×4 IMPLANT
GAUZE SPONGE 2X2 8PLY STRL LF (GAUZE/BANDAGES/DRESSINGS) ×2 IMPLANT
GEL ULTRASOUND 20GR AQUASONIC (MISCELLANEOUS) IMPLANT
GLOVE BIO SURGEON STRL SZ7 (GLOVE) IMPLANT
GLOVE BIOGEL PI IND STRL 7.0 (GLOVE) ×2 IMPLANT
GLOVE BIOGEL PI IND STRL 7.5 (GLOVE) ×8 IMPLANT
GLOVE BIOGEL PI INDICATOR 7.0 (GLOVE) ×2
GLOVE BIOGEL PI INDICATOR 7.5 (GLOVE) ×8
GLOVE SURG SS PI 6.5 STRL IVOR (GLOVE) ×8 IMPLANT
GLOVE SURG SS PI 7.0 STRL IVOR (GLOVE) ×16 IMPLANT
GLOVE SURG SS PI 7.5 STRL IVOR (GLOVE) ×8 IMPLANT
GOWN STRL REUS W/ TWL LRG LVL3 (GOWN DISPOSABLE) ×12 IMPLANT
GOWN STRL REUS W/TWL LRG LVL3 (GOWN DISPOSABLE) ×12
KIT BASIN OR (CUSTOM PROCEDURE TRAY) ×4 IMPLANT
KIT ROOM TURNOVER OR (KITS) ×4 IMPLANT
LEGGING LITHOTOMY PAIR STRL (DRAPES) IMPLANT
LIGASURE IMPACT 36 18CM CVD LR (INSTRUMENTS) IMPLANT
LIQUID BAND (GAUZE/BANDAGES/DRESSINGS) ×4 IMPLANT
NS IRRIG 1000ML POUR BTL (IV SOLUTION) ×8 IMPLANT
PACK GENERAL/GYN (CUSTOM PROCEDURE TRAY) ×4 IMPLANT
PAD ARMBOARD 7.5X6 YLW CONV (MISCELLANEOUS) ×8 IMPLANT
PENCIL BUTTON HOLSTER BLD 10FT (ELECTRODE) ×8 IMPLANT
RELOAD PROXIMATE 75MM BLUE (ENDOMECHANICALS) ×8 IMPLANT
RTRCTR WOUND ALEXIS 18CM MED (MISCELLANEOUS)
SCALPEL HARMONIC ACE (MISCELLANEOUS) ×4 IMPLANT
SCISSORS LAP 5X35 DISP (ENDOMECHANICALS) ×4 IMPLANT
SET IRRIG TUBING LAPAROSCOPIC (IRRIGATION / IRRIGATOR) ×4 IMPLANT
SLEEVE ENDOPATH XCEL 5M (ENDOMECHANICALS) ×4 IMPLANT
SLEEVE SURGEON STRL (DRAPES) ×4 IMPLANT
SPECIMEN JAR LARGE (MISCELLANEOUS) ×4 IMPLANT
SPONGE GAUZE 2X2 STER 10/PKG (GAUZE/BANDAGES/DRESSINGS) ×2
SPONGE LAP 18X18 X RAY DECT (DISPOSABLE) ×8 IMPLANT
STAPLER GUN LINEAR PROX 60 (STAPLE) ×4 IMPLANT
STAPLER PROXIMATE 75MM BLUE (STAPLE) ×4 IMPLANT
STAPLER VISISTAT 35W (STAPLE) ×4 IMPLANT
SUCTION POOLE TIP (SUCTIONS) ×4 IMPLANT
SURGILUBE 2OZ TUBE FLIPTOP (MISCELLANEOUS) IMPLANT
SUT ETHILON 2 0 FS 18 (SUTURE) ×4 IMPLANT
SUT MNCRL AB 4-0 PS2 18 (SUTURE) IMPLANT
SUT PDS AB 1 CTX 36 (SUTURE) ×8 IMPLANT
SUT PDS AB 1 TP1 96 (SUTURE) ×12 IMPLANT
SUT PROLENE 2 0 CT2 30 (SUTURE) IMPLANT
SUT PROLENE 2 0 KS (SUTURE) IMPLANT
SUT SILK 2 0 (SUTURE) ×2
SUT SILK 2 0 SH CR/8 (SUTURE) ×4 IMPLANT
SUT SILK 2 0 TIES 10X30 (SUTURE) ×4 IMPLANT
SUT SILK 2-0 18XBRD TIE 12 (SUTURE) ×2 IMPLANT
SUT SILK 3 0 (SUTURE) ×2
SUT SILK 3 0 SH CR/8 (SUTURE) ×4 IMPLANT
SUT SILK 3 0 TIES 10X30 (SUTURE) ×4 IMPLANT
SUT SILK 3-0 18XBRD TIE 12 (SUTURE) ×2 IMPLANT
SUT VIC AB 3-0 SH 18 (SUTURE) IMPLANT
SUT VIC AB 3-0 SH 8-18 (SUTURE) IMPLANT
SYR BULB IRRIGATION 50ML (SYRINGE) ×4 IMPLANT
SYS LAPSCP GELPORT 120MM (MISCELLANEOUS)
SYSTEM LAPSCP GELPORT 120MM (MISCELLANEOUS) IMPLANT
TOWEL OR 17X26 10 PK STRL BLUE (TOWEL DISPOSABLE) ×4 IMPLANT
TRAY FOLEY CATH 14FRSI W/METER (CATHETERS) IMPLANT
TRAY FOLEY CATH 16FRSI W/METER (SET/KITS/TRAYS/PACK) ×4 IMPLANT
TRAY LAPAROSCOPIC MC (CUSTOM PROCEDURE TRAY) ×4 IMPLANT
TRAY PROCTOSCOPIC FIBER OPTIC (SET/KITS/TRAYS/PACK) IMPLANT
TROCAR XCEL 12X100 BLDLESS (ENDOMECHANICALS) IMPLANT
TROCAR XCEL BLUNT TIP 100MML (ENDOMECHANICALS) IMPLANT
TROCAR XCEL NON-BLD 11X100MML (ENDOMECHANICALS) IMPLANT
TROCAR XCEL NON-BLD 5MMX100MML (ENDOMECHANICALS) ×4 IMPLANT
TUBE CONNECTING 12'X1/4 (SUCTIONS) ×2
TUBE CONNECTING 12X1/4 (SUCTIONS) ×6 IMPLANT
TUBING FILTER THERMOFLATOR (ELECTROSURGICAL) ×4 IMPLANT
TUBING INSUFFLATION (TUBING) ×4 IMPLANT
WATER STERILE IRR 1000ML POUR (IV SOLUTION) IMPLANT
YANKAUER SUCT BULB TIP NO VENT (SUCTIONS) ×8 IMPLANT

## 2015-08-03 NOTE — H&P (Signed)
[name] is an 50 y.o. female.   Chief Complaint: appendicitis with persistent leak s/p drain HPI:   31 yof who was admitted [facility] 9/8 for perforated appendicitis. she underwent drain placement times two. she eventually was discharged home. her last ct scan showed a small 3.7x2.7 periappendiceal fluid collection. tube injection showed fistulous connection to cecum. had tube downsized and pulled back with to gravity. after that noted some leg pain and low grade fever. underwent ct scan on 11/22 that showed interval increase in rlq fluid collection and not clear catheter was functional. she also had multiple nodes. new tube was placed and now she is much better. she again has never had colonoscopy. this persists with another drain study  Past Medical History  Diagnosis Date  . Anemia   . Depression   . Anxiety   . JP drain bleeding     Past Surgical History  Procedure Laterality Date  . Abdominal hysterectomy    . Gastric bypass      History reviewed. No pertinent family history. Social History:  reports that she has never smoked. She does not have any smokeless tobacco history on file. She reports that she drinks alcohol. She reports that she does not use illicit drugs.  Allergies:  Allergies  Allergen Reactions  . Latex Hives  . Cymbalta [Duloxetine Hcl] Rash  . Penicillins Rash    Has patient had a PCN reaction causing immediate rash, facial/tongue/throat swelling, SOB or lightheadedness with hypotension: No Has patient had a PCN reaction causing severe rash involving mucus membranes or skin necrosis: No Has patient had a PCN reaction that required hospitalization No Has patient had a PCN reaction occurring within the last 10 years: No If all of the above answers are "NO", then may proceed with Cephalosporin use.     Medications Prior to Admission  Medication Sig Dispense Refill  . acetaminophen (TYLENOL) 500 MG tablet Take 1,000 mg by mouth every 6 (six) hours  as needed for fever.    . cyclobenzaprine (FLEXERIL) 10 MG tablet Take 10 mg by mouth 3 (three) times daily as needed for muscle spasms.    . diphenhydrAMINE (BENADRYL) 25 mg capsule Take 25 mg by mouth every 6 (six) hours as needed for sleep.    Marland Kitchen escitalopram (LEXAPRO) 10 MG tablet Take 10 mg by mouth daily.    . ferrous sulfate 325 (65 FE) MG tablet Take 325 mg by mouth 2 (two) times daily with a meal.    . oxyCODONE (OXY IR/ROXICODONE) 5 MG immediate release tablet Take 1-2 tablets (5-10 mg total) by mouth every 4 (four) hours as needed for moderate pain. 40 tablet 0  . Sodium Chloride Flush (NORMAL SALINE FLUSH) 0.9 % SOLN Inject 8 mLs into the vein daily. Uses to flush drain  2  . traMADol (ULTRAM) 50 MG tablet Take 50 mg by mouth every 6 (six) hours as needed for moderate pain.    . vitamin B-12 (CYANOCOBALAMIN) 1000 MCG tablet Take 500 mcg by mouth daily.    . vitamin C (ASCORBIC ACID) 500 MG tablet Take 500 mg by mouth daily.      No results found for this or any previous visit (from the past 48 hour(s)). No results found.  ROS  negative  Blood pressure 137/87, pulse 94, temperature 98.3 F (36.8 C), temperature source Oral, resp. rate 20, height 5\' 1"  (1.549 m), weight 65.318 kg (144 lb), SpO2 99 %. Physical Exam   Vitals Weight: 145  lb Height: 61in Body Surface Area: 1.65 m Body Mass Index: 27.4 kg/m  Temp.: 97.83F  Pulse: 72 (Regular)  BP: 116/80 (Sitting, Left Arm, Standard) Physical Exam  Abdomen Note: soft nt/nd drain in place with only clear fluid (this is not doing much since placement) cv rrr pulm clear bilaterally  Assessment/Plan Assessment & Plan  APPENDICEAL ABSCESS (K35.3) Story: I discussed surgery with her today. I wanted to get scope to rule out mass at this site. It does not appear it is healing though. I discussed laparoscopic exploration with possible appy with cuff of cecum vs ileocecectomy vs right colectomy.    Alitzel Cookson 08/03/2015, 12:09 PM

## 2015-08-03 NOTE — Anesthesia Preprocedure Evaluation (Addendum)
Anesthesia Evaluation  Patient identified by MRN, date of birth, ID band Patient awake    Reviewed: Allergy & Precautions, NPO status , Patient's Chart, lab work & pertinent test results  Airway Mallampati: I  TM Distance: <3 FB Neck ROM: Full  Mouth opening: Limited Mouth Opening  Dental  (+) Teeth Intact, Chipped,    Pulmonary neg pulmonary ROS,    breath sounds clear to auscultation       Cardiovascular negative cardio ROS   Rhythm:Regular Rate:Normal     Neuro/Psych PSYCHIATRIC DISORDERS Anxiety Depression negative neurological ROS     GI/Hepatic negative GI ROS, Neg liver ROS,   Endo/Other  negative endocrine ROS  Renal/GU negative Renal ROS  negative genitourinary   Musculoskeletal negative musculoskeletal ROS (+)   Abdominal   Peds negative pediatric ROS (+)  Hematology   Anesthesia Other Findings   Reproductive/Obstetrics negative OB ROS                            Lab Results  Component Value Date   WBC 5.5 07/29/2015   HGB 10.4* 07/29/2015   HCT 33.7* 07/29/2015   MCV 77.8* 07/29/2015   PLT 259 07/29/2015   Lab Results  Component Value Date   CREATININE 0.46 07/29/2015   BUN 7 07/29/2015   NA 140 07/29/2015   K 4.4 07/29/2015   CL 106 07/29/2015   CO2 25 07/29/2015   Lab Results  Component Value Date   INR 1.48 04/02/2015     Anesthesia Physical Anesthesia Plan  ASA: II  Anesthesia Plan: General   Post-op Pain Management:    Induction: Intravenous  Airway Management Planned: Oral ETT  Additional Equipment:   Intra-op Plan:   Post-operative Plan: Extubation in OR  Informed Consent: I have reviewed the patients History and Physical, chart, labs and discussed the procedure including the risks, benefits and alternatives for the proposed anesthesia with the patient or authorized representative who has indicated his/her understanding and acceptance.    Dental advisory given  Plan Discussed with: CRNA  Anesthesia Plan Comments:         Anesthesia Quick Evaluation

## 2015-08-03 NOTE — Anesthesia Postprocedure Evaluation (Signed)
Anesthesia Post Note  Patient: Keylah Killough  Procedure(s) Performed: Procedure(s) (LRB): LAPAROSCOPIC DIAGNOSTIC RIGHT COLECTOMY (Right) COLON RESECTION RIGHT (N/A)  Patient location during evaluation: PACU Anesthesia Type: General Level of consciousness: awake and alert Pain management: pain level controlled Vital Signs Assessment: post-procedure vital signs reviewed and stable Respiratory status: spontaneous breathing and respiratory function stable Cardiovascular status: blood pressure returned to baseline and stable Postop Assessment: no signs of nausea or vomiting Anesthetic complications: no    Last Vitals:  Filed Vitals:   08/03/15 1515 08/03/15 1530  BP: 127/75 120/74  Pulse: 65 70  Temp:    Resp: 16 24    Last Pain:  Filed Vitals:   08/03/15 1531  PainSc: 10-Worst pain ever                 Jovonda Selner S

## 2015-08-03 NOTE — Progress Notes (Signed)
Pt admitted to 6N20 via stretcher from PACU.  Pt on 2L O2 via [place].  Pt has MLA incision with honeycomb and transparent dsg with old drainage.  Pt has gauze and transparent dsg to Rt lateral abdomen and two abd lap sites with gauze and transparent dsg to lt abdomen.  JP with biopatch and transparent dsg to transverse abd with bloody drainage.  Pt has 20G to rt hand with fluids infusing and reduced Morphine PCA infusing.  Foley intact draining clear yellow urine.  SCDs in place.  Pt has no questions at the moment.  Will continue to monitor.

## 2015-08-03 NOTE — Interval H&P Note (Signed)
History and Physical Interval Note:  08/03/2015 12:11 PM  [name]  has presented today for surgery, with the diagnosis of APPENDICITIS  The various methods of treatment have been discussed with the patient and family. After consideration of risks, benefits and other options for treatment, the patient has consented to  Procedure(s): LAPAROSCOPIC ILEOCECECTOMY POSSIBLE RIGHT COLECTOMY (N/A) COLON RESECTION RIGHT (N/A) as a surgical intervention .  The patient's history has been reviewed, patient examined, no change in status, stable for surgery.  I have reviewed the patient's chart and labs.  Questions were answered to the patient's satisfaction.     [name]

## 2015-08-03 NOTE — Transfer of Care (Signed)
Immediate Anesthesia Transfer of Care Note  Patient: [name]  Procedure(s) Performed: Procedure(s): LAPAROSCOPIC DIAGNOSTIC RIGHT COLECTOMY (Right) COLON RESECTION RIGHT (N/A)  Patient Location: PACU  Anesthesia Type:General  Level of Consciousness: awake, alert , oriented, patient cooperative and responds to stimulation  Airway & Oxygen Therapy: Patient Spontanous Breathing and Patient connected to nasal cannula oxygen  Post-op Assessment: Report given to RN, Post -op Vital signs reviewed and stable and Patient moving all extremities X 4  Post vital signs: Reviewed and stable  Last Vitals:  Filed Vitals:   08/03/15 1120  BP: 137/87  Pulse: 94  Temp: 36.8 C  Resp: 20    Complications: No apparent anesthesia complications

## 2015-08-03 NOTE — Anesthesia Procedure Notes (Signed)
Procedure Name: Intubation Date/Time: 08/03/2015 1:01 PM Performed by: Tressia Miners LEFFEW Pre-anesthesia Checklist: Patient identified, Patient being monitored, Timeout performed, Emergency Drugs available and Suction available Patient Re-evaluated:Patient Re-evaluated prior to inductionOxygen Delivery Method: Circle System Utilized Preoxygenation: Pre-oxygenation with 100% oxygen Intubation Type: IV induction Ventilation: Mask ventilation without difficulty Laryngoscope Size: Glidescope Grade View: Grade II Tube type: Oral Tube size: 7.5 mm Number of attempts: 2 Airway Equipment and Method: Video-laryngoscopy Placement Confirmation: ETT inserted through vocal cords under direct vision,  positive ETCO2 and breath sounds checked- equal and bilateral Secured at: 22 cm Tube secured with: Tape Dental Injury: Teeth and Oropharynx as per pre-operative assessment  Comments: Attempted DL with Mac 3 and bougie unsuccessful. VL by Dr. Smith Robert MD. Grade II view.

## 2015-08-03 NOTE — Op Note (Signed)
Preoperative diagnosis: perforated appendicitis without improvement s/p drainage Postoperative diagnosis: cecal perforation and rlq abscess Procedure: diagnostic laparoscopy, open right colectomy Surgeon: Dr Serita Grammes Anes: general EBL: 50 cc Specimens: right colon Complications none Drains 19 Fr Blake drain to rlq Sponge and needle count correct at end dispo to recovery stable  Indications: This is a 15 yof who presented with ruptured appendicitis and rlq abscess.  This has been attempted to be managed nonoperatively but failed.  She still has persistent connection to cecum with drain in place. I discussed laparoscopy with possible ileocectomy vs right colectomy.   Procedure: After informed consent was obtained, she was taken to the operating room she was given antibiotics. She had scds in place. She was placed under general anesthesia without complication. She had a foley placed.  She was prepped and draped in the standard sterile surgical fashion. A timeout was performed.    I infiltrated marcaine below the incision and made a vertical incision. I then opened the fascia and entered the peritoneum bluntly. I placed a hasson trocar and a 0 vicryl pursestring suture.  I then insufflated the abdomen to 15 mm hg pressure. She had adhesions in lower abdomen as well at the midline. I placed 3 further 5 mm trocars in the left abdomen. I then used scissors to lyse adhesions to the midline which was the omentum and the sigmoid colon in the pelvis.  Once these were released I looked to the cecum. The cecum was adherent to the abdominal wall. The terminal ileum was identified tracking into the cecum.  I was able to bluntly dissect some of the cecum from the abdominal wall.  This was very adherent though. I entered into an abscess cavity posterior to the cecum and this appeared to connect to the cecum. Due to my inability to get the colon off the abdominal wall I elected to make a midline incision.   I then was able to manually finger fracture the abscess cavity and bring the colon medial.  There was a 2 cm hole present in the cecum. The drain was removed.  I then decided to do a right colectomy due to this hole and an ileocecectomy would have put an anastomosis near the hepatic flexure. I did not go anywhere near her bypass anatomy. I then released the white line and rolled the colon medial. The duodenum was visualized and intact. I then divided the TI with a gia stapler I divided the transverse colon with the gia stapler as well.I then used the ligasure and silk ties to divide the mesentery and this was passed off the table. I then irrigated and obtained hemostasis.  I  I then closed the mesenteric defect with silk suture. I brought the small bowel next to the transverse colon. I secured this with silk suture. I then made enterotomies in both. I created an anastomosis with a gia stapler and closed the common enterotomy with a TX stapler. I placed 2 2-0 silk crotch sutures.  This was patent.  I then placed omentum over this.  We then performed the colon protocol with gown/glove change and new instruments.  I placed a 19 Fr Blake drain in the rlq and secured this with 2-0 nylon suture.  I then closed the abdomen with #1 looped PDS. the wound was irrigated and all incisions were closed with staples. Sterile dressing placed.  She was extubated and then transferred to recovery stable.

## 2015-08-04 ENCOUNTER — Encounter (HOSPITAL_COMMUNITY): Payer: Self-pay | Admitting: General Surgery

## 2015-08-04 LAB — BASIC METABOLIC PANEL
Anion gap: 8 (ref 5–15)
BUN: 5 mg/dL — AB (ref 6–20)
CO2: 25 mmol/L (ref 22–32)
CREATININE: 0.63 mg/dL (ref 0.44–1.00)
Calcium: 8.6 mg/dL — ABNORMAL LOW (ref 8.9–10.3)
Chloride: 103 mmol/L (ref 101–111)
GFR calc Af Amer: >60 mL/min (ref >60)
Glucose, Bld: 157 mg/dL — ABNORMAL HIGH (ref 65–99)
Potassium: 3.8 mmol/L (ref 3.5–5.1)
SODIUM: 136 mmol/L (ref 135–145)

## 2015-08-04 LAB — CBC
HCT: 33.4 % — ABNORMAL LOW (ref 36.0–46.0)
Hemoglobin: 10.3 g/dL — ABNORMAL LOW (ref 12.0–15.0)
MCH: 24.1 pg — ABNORMAL LOW (ref 26.0–34.0)
MCHC: 30.8 g/dL (ref 30.0–36.0)
MCV: 78.2 fL (ref 78.0–100.0)
PLATELETS: 214 10*3/uL (ref 150–400)
RBC: 4.27 MIL/uL (ref 3.87–5.11)
RDW: 14.6 % (ref 11.5–15.5)
WBC: 12.8 10*3/uL — AB (ref 4.0–10.5)

## 2015-08-04 MED ORDER — KETOROLAC TROMETHAMINE 15 MG/ML IJ SOLN
15.0000 mg | Freq: Once | INTRAMUSCULAR | Status: AC
  Administered 2015-08-04: 15 mg via INTRAVENOUS
  Filled 2015-08-04: qty 1

## 2015-08-04 MED ORDER — ALVIMOPAN 12 MG PO CAPS: 12.0000 mg | ORAL_CAPSULE | Freq: Once | ORAL | Status: DC

## 2015-08-04 MED ORDER — MORPHINE SULFATE 2 MG/ML IV SOLN
INTRAVENOUS | Status: DC
  Administered 2015-08-04: 7 mg via INTRAVENOUS
  Administered 2015-08-04: 6 mg via INTRAVENOUS
  Administered 2015-08-04: 5 mg via INTRAVENOUS
  Administered 2015-08-04: 9 mg via INTRAVENOUS
  Administered 2015-08-05: 5 mg via INTRAVENOUS
  Administered 2015-08-05: 3 mg via INTRAVENOUS
  Administered 2015-08-05: 6 mg via INTRAVENOUS
  Administered 2015-08-05: 8 mg via INTRAVENOUS
  Administered 2015-08-05: 3 mg via INTRAVENOUS
  Administered 2015-08-06 (×2): 4 mg via INTRAVENOUS
  Filled 2015-08-04 (×2): qty 25

## 2015-08-04 MED ORDER — KCL IN DEXTROSE-NACL 20-5-0.45 MEQ/L-%-% IV SOLN
INTRAVENOUS | Status: DC
Start: 2015-08-04 — End: 2015-08-10
  Administered 2015-08-04 – 2015-08-08 (×6): via INTRAVENOUS
  Filled 2015-08-04 (×8): qty 1000

## 2015-08-04 MED ORDER — ALVIMOPAN 12 MG PO CAPS
12.0000 mg | ORAL_CAPSULE | Freq: Two times a day (BID) | ORAL | Status: DC
  Administered 2015-08-05 – 2015-08-08 (×7): 12 mg via ORAL
  Filled 2015-08-04 (×7): qty 1

## 2015-08-04 NOTE — Care Management Note (Signed)
Case Management Note  Patient Details  Name: [name] MRN: [id number] Date of Birth: 1966-06-09  Subjective/Objective:                    Action/Plan:  Initial UR completed . Continue to follow  Expected Discharge Date:                  Expected Discharge Plan:  Home/Self Care  In-House Referral:     Discharge planning Services     Post Acute Care Choice:    Choice offered to:     DME Arranged:    DME Agency:     HH Arranged:    HH Agency:     Status of Service:  In process, will continue to follow  Medicare Important Message Given:    Date Medicare IM Given:    Medicare IM give by:    Date Additional Medicare IM Given:    Additional Medicare Important Message give by:     If discussed at [place] of Stay Meetings, dates discussed:    Additional Comments:  Marilu Favre, RN 08/04/2015, 8:25 AM

## 2015-08-04 NOTE — Progress Notes (Signed)
1 Day Post-Op  Subjective: No flatus, pain fair control, discussed surgery  Objective: Vital signs in last 24 hours: Temp:  [97.2 F (36.2 C)-99.7 F (37.6 C)] 98.9 F (37.2 C) (01/10 0458) Pulse Rate:  [62-113] 110 (01/10 0458) Resp:  [13-24] 17 (01/10 0458) BP: (103-146)/(59-93) 135/71 mmHg (01/10 0458) SpO2:  [98 %-100 %] 99 % (01/10 0458) Weight:  [65.318 kg (144 lb)] 65.318 kg (144 lb) (01/09 1120) Last BM Date: 08/02/15  Intake/Output from previous day: 01/09 0701 - 01/10 0700 In: 680 [I.V.:680] Out: 680 [Urine:510; Drains:120; Blood:50] Intake/Output this shift:    General appearance: no distress Resp: clear to auscultation bilaterally Cardio: regular rate and rhythm GI: approp tender no ds  Lab Results:   Recent Labs  08/04/15 0346  WBC 12.8*  HGB 10.3*  HCT 33.4*  PLT 214   BMET  Recent Labs  08/04/15 0346  NA 136  K 3.8  CL 103  CO2 25  GLUCOSE 157*  BUN 5*  CREATININE 0.63  CALCIUM 8.6*   PT/INR No results for input(s): LABPROT, INR in the last 72 hours. ABG No results for input(s): PHART, HCO3 in the last 72 hours.  Invalid input(s): PCO2, PO2  Studies/Results: No results found.  Anti-infectives: Anti-infectives    Start     Dose/Rate Route Frequency Ordered Stop   08/03/15 1315  ciprofloxacin (CIPRO) IVPB 400 mg     400 mg 200 mL/hr over 60 Minutes Intravenous To Surgery 08/03/15 1306 08/03/15 1339   08/03/15 1236  clindamycin (CLEOCIN) 900 MG/50ML IVPB    Comments:  Holtzman, Ariel   : cabinet override      08/03/15 1236 08/03/15 1854   08/02/15 1400  neomycin (MYCIFRADIN) tablet 1,000 mg  Status:  Discontinued     1,000 mg Oral 3 times per day 08/02/15 1138 08/03/15 1718   08/02/15 1400  erythromycin (E-MYCIN) tablet 1,000 mg  Status:  Discontinued     1,000 mg Oral 3 times per day 08/02/15 1138 08/03/15 1718      Assessment/Plan: POD 1 open right colectomy   1. Increase pca, dose of toradol today as pain not well  controlled 2. pulm toilet 3. Clear liquids 4. Dc foley 5. Path pending 6. Lovenox, scds  [facility] 08/04/2015

## 2015-08-04 NOTE — Progress Notes (Signed)
Encouraged pt to get OOB several times today. Pt has refused each time.

## 2015-08-05 MED ORDER — KETOROLAC TROMETHAMINE 15 MG/ML IJ SOLN
15.0000 mg | Freq: Four times a day (QID) | INTRAMUSCULAR | Status: DC | PRN
  Administered 2015-08-05: 15 mg via INTRAVENOUS
  Filled 2015-08-05 (×2): qty 1

## 2015-08-05 MED ORDER — METHOCARBAMOL 1000 MG/10ML IJ SOLN
500.0000 mg | Freq: Three times a day (TID) | INTRAVENOUS | Status: DC | PRN
  Administered 2015-08-05 – 2015-08-08 (×4): 500 mg via INTRAVENOUS
  Filled 2015-08-05 (×8): qty 5

## 2015-08-05 NOTE — Progress Notes (Signed)
2 Days Post-Op  Subjective: Complains of pain, no n/v, no flatus  Objective: Vital signs in last 24 hours: Temp:  [98 F (36.7 C)-99 F (37.2 C)] 98.3 F (36.8 C) (01/11 0615) Pulse Rate:  [107-122] 109 (01/11 0615) Resp:  [16-30] 30 (01/11 0747) BP: (119-129)/(62-79) 122/65 mmHg (01/11 0615) SpO2:  [96 %-100 %] 97 % (01/11 0747) Last BM Date: 08/02/15  Intake/Output from previous day: 01/10 0701 - 01/11 0700 In: 2971.7 [P.O.:600; I.V.:2371.7] Out: 1660 [Urine:1400; Drains:260] Intake/Output this shift:    General appearance: no distress Resp: clear to auscultation bilaterally Cardio: regular rate and rhythm GI: drain serous, bs minimal, wound clean approp tender  Lab Results:   Recent Labs  08/04/15 0346  WBC 12.8*  HGB 10.3*  HCT 33.4*  PLT 214   BMET  Recent Labs  08/04/15 0346  NA 136  K 3.8  CL 103  CO2 25  GLUCOSE 157*  BUN 5*  CREATININE 0.63  CALCIUM 8.6*   PT/INR No results for input(s): LABPROT, INR in the last 72 hours. ABG No results for input(s): PHART, HCO3 in the last 72 hours.  Invalid input(s): PCO2, PO2  Studies/Results: No results found.  Anti-infectives: Anti-infectives    Start     Dose/Rate Route Frequency Ordered Stop   08/03/15 1315  ciprofloxacin (CIPRO) IVPB 400 mg     400 mg 200 mL/hr over 60 Minutes Intravenous To Surgery 08/03/15 1306 08/03/15 1339   08/03/15 1236  clindamycin (CLEOCIN) 900 MG/50ML IVPB    Comments:  Holtzman, Ariel   : cabinet override      08/03/15 1236 08/03/15 1854   08/02/15 1400  neomycin (MYCIFRADIN) tablet 1,000 mg  Status:  Discontinued     1,000 mg Oral 3 times per day 08/02/15 1138 08/03/15 1718   08/02/15 1400  erythromycin (E-MYCIN) tablet 1,000 mg  Status:  Discontinued     1,000 mg Oral 3 times per day 08/02/15 1138 08/03/15 1718      Assessment/Plan: POD 2 open right colectomy   1. Add toradol and muscle relaxant to pca today hopefully will get better pain control 2. pulm  toilet, needs to be oob today 3. Clear liquids 4. Check bmet tomorrow 5. Path pending 6. Lovenox, scds  [facility] 08/05/2015

## 2015-08-05 NOTE — Progress Notes (Signed)
Patient refused Incentive spirometer use and scheduled ambulation in hall due to pain. Encourage PCA use.

## 2015-08-06 LAB — BASIC METABOLIC PANEL
Anion gap: 9 (ref 5–15)
CHLORIDE: 104 mmol/L (ref 101–111)
CO2: 21 mmol/L — AB (ref 22–32)
CREATININE: 0.45 mg/dL (ref 0.44–1.00)
Calcium: 9 mg/dL (ref 8.9–10.3)
GFR calc Af Amer: >60 mL/min (ref >60)
GFR calc non Af Amer: >60 mL/min (ref >60)
GLUCOSE: 122 mg/dL — AB (ref 65–99)
POTASSIUM: 3.9 mmol/L (ref 3.5–5.1)
SODIUM: 134 mmol/L — AB (ref 135–145)

## 2015-08-06 MED ORDER — DOCUSATE SODIUM 100 MG PO CAPS
100.0000 mg | ORAL_CAPSULE | Freq: Two times a day (BID) | ORAL | Status: DC
  Administered 2015-08-06 – 2015-08-11 (×11): 100 mg via ORAL
  Filled 2015-08-06 (×11): qty 1

## 2015-08-06 MED ORDER — HYDROMORPHONE HCL 1 MG/ML IJ SOLN
1.0000 mg | INTRAMUSCULAR | Status: DC | PRN
  Administered 2015-08-06 – 2015-08-09 (×10): 1 mg via INTRAVENOUS
  Filled 2015-08-06 (×10): qty 1

## 2015-08-06 MED ORDER — ACETAMINOPHEN 10 MG/ML IV SOLN
1000.0000 mg | Freq: Four times a day (QID) | INTRAVENOUS | Status: AC
  Administered 2015-08-06 – 2015-08-07 (×4): 1000 mg via INTRAVENOUS
  Filled 2015-08-06 (×4): qty 100

## 2015-08-06 MED ORDER — OXYCODONE HCL 5 MG PO TABS
10.0000 mg | ORAL_TABLET | ORAL | Status: DC | PRN
  Administered 2015-08-06 – 2015-08-11 (×22): 10 mg via ORAL
  Filled 2015-08-06 (×22): qty 2

## 2015-08-06 NOTE — Progress Notes (Signed)
3 Days Post-Op  Subjective: Feels rumbling, no flatus, no n/v, oob yesterday  Objective: Vital signs in last 24 hours: Temp:  [97.8 F (36.6 C)-98.6 F (37 C)] 97.8 F (36.6 C) (01/12 0423) Pulse Rate:  [109-120] 120 (01/12 0423) Resp:  [16-20] 16 (01/12 0427) BP: (121-135)/(72-83) 135/83 mmHg (01/12 0423) SpO2:  [95 %-99 %] 99 % (01/12 0427) Last BM Date: 08/02/15  Intake/Output from previous day: 01/11 0701 - 01/12 0700 In: 2628.3 [P.O.:600; I.V.:2028.3] Out: 1910 [Urine:1800; Drains:110] Intake/Output this shift:    Resp: clear to auscultation bilaterally Cardio: regular rate and rhythm GI: bs present soft approp tender incision clean drain serous  Lab Results:   Recent Labs  08/04/15 0346  WBC 12.8*  HGB 10.3*  HCT 33.4*  PLT 214   BMET  Recent Labs  08/04/15 0346 08/06/15 0427  NA 136 134*  K 3.8 3.9  CL 103 104  CO2 25 21*  GLUCOSE 157* 122*  BUN 5* <5*  CREATININE 0.63 0.45  CALCIUM 8.6* 9.0   PT/INR No results for input(s): LABPROT, INR in the last 72 hours. ABG No results for input(s): PHART, HCO3 in the last 72 hours.  Invalid input(s): PCO2, PO2  Studies/Results: No results found.  Anti-infectives: Anti-infectives    Start     Dose/Rate Route Frequency Ordered Stop   08/03/15 1315  ciprofloxacin (CIPRO) IVPB 400 mg     400 mg 200 mL/hr over 60 Minutes Intravenous To Surgery 08/03/15 1306 08/03/15 1339   08/03/15 1236  clindamycin (CLEOCIN) 900 MG/50ML IVPB    Comments:  Holtzman, Ariel   : cabinet override      08/03/15 1236 08/03/15 1854   08/02/15 1400  neomycin (MYCIFRADIN) tablet 1,000 mg  Status:  Discontinued     1,000 mg Oral 3 times per day 08/02/15 1138 08/03/15 1718   08/02/15 1400  erythromycin (E-MYCIN) tablet 1,000 mg  Status:  Discontinued     1,000 mg Oral 3 times per day 08/02/15 1138 08/03/15 1718      Assessment/Plan: POD 3 open right colectomy   1. Will try oral pain meds with iv backup and ofirmev for pain  control, cont mm relaxant, abdominal binder 2. pulm toilet, needs to be oob today more 3. full liquids 4. Path pending 5. Lovenox, scds  [facility] 08/06/2015

## 2015-08-07 NOTE — Progress Notes (Signed)
Many attempts by staff have been made  today to have pt  get OOB. She has refused except to go to the bathroom.

## 2015-08-07 NOTE — Care Management Note (Signed)
Case Management Note  Patient Details  Name: [name] MRN: [id number] Date of Birth: [birth year]  Subjective/Objective:                    Action/Plan:  UR updated  Expected Discharge Date:                  Expected Discharge Plan:  Home/Self Care  In-House Referral:     Discharge planning Services     Post Acute Care Choice:    Choice offered to:     DME Arranged:    DME Agency:     HH Arranged:    [place] Agency:     Status of Service:  In process, will continue to follow  Medicare Important Message Given:    Date Medicare IM Given:    Medicare IM give by:    Date Additional Medicare IM Given:    Additional Medicare Important Message give by:     If discussed at [place] of Stay Meetings, dates discussed:    Additional Comments:  Marilu Favre, RN 08/07/2015, 10:22 AM

## 2015-08-08 MED ORDER — DEXTROSE 5 % IV SOLN
1000.0000 mg | Freq: Three times a day (TID) | INTRAVENOUS | Status: DC | PRN
  Administered 2015-08-08 – 2015-08-09 (×2): 1000 mg via INTRAVENOUS
  Filled 2015-08-08 (×5): qty 10

## 2015-08-08 NOTE — Progress Notes (Signed)
[facility] Progress Note  5 Days Post-Op  Subjective: Pt's pain significantly improved with robaxin.  No N/V, tolerating fulls well.  Hasn't been OOB much and refusing with nursing staff.  Had a pasty BM and lots of flatus.    Objective: Vital signs in last 24 hours: Temp:  [98.8 F (37.1 C)-100.5 F (38.1 C)] 98.8 F (37.1 C) (01/14 0618) Pulse Rate:  [120-123] 123 (01/14 0618) Resp:  [18-19] 18 (01/14 0618) BP: (133-149)/(73-92) 133/73 mmHg (01/14 0618) SpO2:  [97 %-98 %] 97 % (01/14 0618) Last BM Date:  (prior to surgery)  Intake/Output from previous day: 01/13 0701 - 01/14 0700 In: 2007.5 [P.O.:840; I.V.:1167.5] Out: 70 [Drains:70] Intake/Output this shift: Total I/O In: 120 [P.O.:120] Out: -   PE: Gen:  Alert, NAD, pleasant Abd: Soft, appropriately tender, +BS, no HSM, incisions C/D/I, drain with serous 35mL/24hr   Lab Results:  No results for input(s): WBC, HGB, HCT, PLT in the last 72 hours. BMET  Recent Labs  08/06/15 0427  NA 134*  K 3.9  CL 104  CO2 21*  GLUCOSE 122*  BUN <5*  CREATININE 0.45  CALCIUM 9.0   PT/INR No results for input(s): LABPROT, INR in the last 72 hours. CMP     Component Value Date/Time   NA 134* 08/06/2015 0427   K 3.9 08/06/2015 0427   CL 104 08/06/2015 0427   CO2 21* 08/06/2015 0427   GLUCOSE 122* 08/06/2015 0427   BUN <5* 08/06/2015 0427   CREATININE 0.45 08/06/2015 0427   CALCIUM 9.0 08/06/2015 0427   PROT 6.8 07/29/2015 1428   ALBUMIN 3.5 07/29/2015 1428   AST 22 07/29/2015 1428   ALT 17 07/29/2015 1428   ALKPHOS 87 07/29/2015 1428   BILITOT 0.3 07/29/2015 1428   GFRNONAA >60 08/06/2015 0427   GFRAA >60 08/06/2015 0427   Lipase     Component Value Date/Time   LIPASE 14* 04/01/2015 2140       Studies/Results: No results found.  Anti-infectives: Anti-infectives    Start     Dose/Rate Route Frequency Ordered Stop   08/03/15 1315  ciprofloxacin (CIPRO) IVPB 400 mg     400 mg 200 mL/hr  over 60 Minutes Intravenous To Surgery 08/03/15 1306 08/03/15 1339   08/03/15 1236  clindamycin (CLEOCIN) 900 MG/50ML IVPB    Comments:  Cooley, Tina   : cabinet override      08/03/15 1236 08/03/15 1854   08/02/15 1400  neomycin (MYCIFRADIN) tablet 1,000 mg  Status:  Discontinued     1,000 mg Oral 3 times per day 08/02/15 1138 08/03/15 1718   08/02/15 1400  erythromycin (E-MYCIN) tablet 1,000 mg  Status:  Discontinued     1,000 mg Oral 3 times per day 08/02/15 1138 08/03/15 1718       Assessment/Plan Cecal perforation and RLQ abscess POD #5 open right colectomy - Dr. Donne [name] 08/03/15  Plan: 1. Encouraged oral pain meds, cont mm relaxant, abdominal binder 2. Pulm toilet, needs to be oob today more 3. Soft diet 4. Path showed adenocarcinoma, OP oncology follow-up will be needed 5. Lovenox, scds 6. Change midline dressing frequently, a bit soupy today, monitor wound carefully 7. Monitor drain output, ?d/c at discharge?    LOS: 5 days    [name] 08/08/2015, 11:04 AM Pager: [phone]

## 2015-08-09 LAB — CBC
HCT: 29 % — ABNORMAL LOW (ref 36.0–46.0)
HEMOGLOBIN: 9.2 g/dL — AB (ref 12.0–15.0)
MCH: 24 pg — AB (ref 26.0–34.0)
MCHC: 31.7 g/dL (ref 30.0–36.0)
MCV: 75.5 fL — AB (ref 78.0–100.0)
PLATELETS: 292 10*3/uL (ref 150–400)
RBC: 3.84 MIL/uL — AB (ref 3.87–5.11)
RDW: 14.3 % (ref 11.5–15.5)
WBC: 6.5 10*3/uL (ref 4.0–10.5)

## 2015-08-09 MED ORDER — ONDANSETRON HCL 4 MG PO TABS
4.0000 mg | ORAL_TABLET | Freq: Four times a day (QID) | ORAL | Status: DC | PRN
  Administered 2015-08-09: 4 mg via ORAL
  Filled 2015-08-09: qty 1

## 2015-08-09 MED ORDER — ACETAMINOPHEN 325 MG PO TABS
650.0000 mg | ORAL_TABLET | Freq: Four times a day (QID) | ORAL | Status: DC | PRN
  Administered 2015-08-09: 650 mg via ORAL
  Filled 2015-08-09: qty 2

## 2015-08-09 MED ORDER — ENSURE ENLIVE PO LIQD
237.0000 mL | Freq: Three times a day (TID) | ORAL | Status: DC
Start: 2015-08-09 — End: 2015-08-10
  Administered 2015-08-09 – 2015-08-10 (×4): 237 mL via ORAL

## 2015-08-09 MED ORDER — METHOCARBAMOL 500 MG PO TABS
1000.0000 mg | ORAL_TABLET | Freq: Three times a day (TID) | ORAL | Status: DC | PRN
  Administered 2015-08-09 – 2015-08-11 (×6): 1000 mg via ORAL
  Filled 2015-08-09 (×6): qty 2

## 2015-08-09 NOTE — Evaluation (Signed)
Physical Therapy Evaluation Patient Details Name: [name] MRN: [id number] DOB: 10/11/1965 Today's Date: 08/09/2015   History of Present Illness  Admitted with perforated appendicitis without improvement s/p drainage; s/p diagnostic laparoscopy, showing cecal perforation and RLQ abscess, now s/p open R colectomy;  has a past medical history of Anemia; Depression; Anxiety; JP drain bleeding; and GERD (gastroesophageal reflux disease).  has past surgical history that includes Abdominal hysterectomy; Gastric bypass; Colon surgery (08/03/2015); Laparoscopic ileocecectomy (Right, 08/03/2015); and Colostomy revision (N/A, 08/03/2015).  Clinical Impression   Patient is s/p above surgery resulting in functional limitations due to the deficits listed below (see PT Problem List).  Patient will benefit from skilled PT to increase their independence and safety with mobility to allow discharge to the venue listed below.    Reinforced to pt the need to walk the hallways multiple times a day.     Follow Up Recommendations No PT follow up;Supervision - Intermittent    Equipment Recommendations  Rolling walker with 5" wheels    Recommendations for Other Services       Precautions / Restrictions Precautions Precautions: None      Mobility  Bed Mobility Overal bed mobility: Modified Independent             General bed mobility comments: shows good form with log roll technique  Transfers Overall transfer level: Needs assistance Equipment used: Rolling walker (2 wheeled) Transfers: Sit to/from Stand Sit to Stand: Supervision         General transfer comment: Cues for hand placement  Ambulation/Gait Ambulation/Gait assistance: Supervision Ambulation Distance (Feet): 250 Feet Assistive device: Rolling walker (2 wheeled) Gait Pattern/deviations: Step-through pattern     General Gait Details: Good use of RW for support and balance; noting tends to keep hips slightly flexed    Stairs            Wheelchair Mobility    Modified Rankin (Stroke Patients Only)       Balance Overall balance assessment: No apparent balance deficits (not formally assessed)                                           Pertinent Vitals/Pain Pain Assessment: 0-10 Pain Score: 5  Pain Location: burning pain in abdomen Pain Descriptors / Indicators: Aching;Burning Pain Intervention(s): Limited activity within patient's tolerance;Monitored during session;Premedicated before session    Home Living Family/patient expects to be discharged to:: Private residence Living Arrangements: Spouse/significant other Available Help at Discharge: Family;Available PRN/intermittently Type of Home: Apartment (reports will move soon; no reliable info re: new place) Home Access: Elevator     Home Layout: One level Home Equipment: None      Prior Function Level of Independence: Independent               Hand Dominance        Extremity/Trunk Assessment   Upper Extremity Assessment: Overall WFL for tasks assessed           Lower Extremity Assessment: Generalized weakness         Communication   Communication: No difficulties  Cognition Arousal/Alertness: Awake/alert Behavior During Therapy: WFL for tasks assessed/performed Overall Cognitive Status: Within Functional Limits for tasks assessed                      General Comments General comments (skin integrity, edema, etc.): Needs lots of encouragement  to get up and OOB and walk the hallways; Education provided benefits of hallway walking and being OOB for the greater part of the day    Exercises        Assessment/Plan    PT Assessment Patient needs continued PT services  PT Diagnosis Acute pain;Generalized weakness   PT Problem List Decreased activity tolerance;Decreased mobility;Pain  PT Treatment Interventions DME instruction;Gait training;Stair training;Functional mobility  training;Therapeutic activities;Therapeutic exercise;Patient/family education   PT Goals (Current goals can be found in the Care Plan section) Acute Rehab PT Goals Patient Stated Goal: Did not state PT Goal Formulation: With patient Time For Goal Achievement: 08/23/15 Potential to Achieve Goals: Good    Frequency Min 3X/week   Barriers to discharge        Co-evaluation               End of Session   Activity Tolerance: Patient tolerated treatment well Patient left: in chair;with call bell/phone within reach Nurse Communication: Mobility status         Time: 1410-1430 PT Time Calculation (min) (ACUTE ONLY): 20 min   Charges:   PT Evaluation $PT Eval Low Complexity: 1 Procedure     PT G CodesQuin Hoop 08/09/2015, 2:48 PM  Roney Marion, [place] Pager [phone] Office [phone]

## 2015-08-09 NOTE — Progress Notes (Signed)
[facility] Progress Note  6 Days Post-Op  Subjective: Pt notes foul smell coming from her midline wound and a lot of drainage when she gets up.  No N/V, tolerating some soft diet.  Would like ensure.  She is NOT ambulating much OOB and has not been out of her room.  She's had several BM's and flatus.    Objective: Vital signs in last 24 hours: Temp:  [98.1 F (36.7 C)-98.7 F (37.1 C)] 98.7 F (37.1 C) (01/15 0503) Pulse Rate:  [101-114] 101 (01/15 0503) Resp:  [19] 19 (01/15 0503) BP: (125-129)/(78-84) 125/78 mmHg (01/15 0503) SpO2:  [98 %] 98 % (01/15 0503) Last BM Date: 08/08/15  Intake/Output from previous day: 01/14 0701 - 01/15 0700 In: 1464.2 [P.O.:360; I.V.:1104.2] Out: 40 [Drains:40] Intake/Output this shift:    PE: Gen:  Alert, NAD, pleasant Abd: Soft, much less tender, +BS, no HSM, incisions with serous yellow/brown foul smelling drainage coming from wound.  I removed 2 staples and placed wicks. Suprapubic drain with serosanguinous 41mL/24hr.  I cleansed the skin and re-dressed.   Lab Results:  No results for input(s): WBC, HGB, HCT, PLT in the last 72 hours. BMET No results for input(s): NA, K, CL, CO2, GLUCOSE, BUN, CREATININE, CALCIUM in the last 72 hours. PT/INR No results for input(s): LABPROT, INR in the last 72 hours. CMP     Component Value Date/Time   NA 134* 08/06/2015 0427   K 3.9 08/06/2015 0427   CL 104 08/06/2015 0427   CO2 21* 08/06/2015 0427   GLUCOSE 122* 08/06/2015 0427   BUN <5* 08/06/2015 0427   CREATININE 0.45 08/06/2015 0427   CALCIUM 9.0 08/06/2015 0427   PROT 6.8 07/29/2015 1428   ALBUMIN 3.5 07/29/2015 1428   AST 22 07/29/2015 1428   ALT 17 07/29/2015 1428   ALKPHOS 87 07/29/2015 1428   BILITOT 0.3 07/29/2015 1428   GFRNONAA >60 08/06/2015 0427   GFRAA >60 08/06/2015 0427   Lipase     Component Value Date/Time   LIPASE 14* 04/01/2015 2140       Studies/Results: No results  found.  Anti-infectives: Anti-infectives    Start     Dose/Rate Route Frequency Ordered Stop   08/03/15 1315  ciprofloxacin (CIPRO) IVPB 400 mg     400 mg 200 mL/hr over 60 Minutes Intravenous To Surgery 08/03/15 1306 08/03/15 1339   08/03/15 1236  clindamycin (CLEOCIN) 900 MG/50ML IVPB    Comments:  Holtzman, Ariel   : cabinet override      08/03/15 1236 08/03/15 1854   08/02/15 1400  neomycin (MYCIFRADIN) tablet 1,000 mg  Status:  Discontinued     1,000 mg Oral 3 times per day 08/02/15 1138 08/03/15 1718   08/02/15 1400  erythromycin (E-MYCIN) tablet 1,000 mg  Status:  Discontinued     1,000 mg Oral 3 times per day 08/02/15 1138 08/03/15 1718       Assessment/Plan Cecal perforation and RLQ abscess POD #6 open right colectomy - Dr. Donne [name] 08/03/15  Plan: 1. Encouraged oral pain meds, cont mm relaxant, she won't wear the abdominal binder 2. Pulm toilet, needs to be oob today more (ordered PT to help facilitate this. 3. Soft diet, plus ensure 4. Path showed adenocarcinoma, OP oncology follow-up as an outpatient 5. Lovenox, scds 6. Midline incisional dressing order updated.  Change every 8 hours, now with wicks in wound, serous brown/yellow drainage with foul smell.  I don't think she needs antibiotics at this point, but  will get CBC.  She's been afebrile.  No significant cellulitis and no purulent drainage. 7. Monitor drain output down to 39mL serosanguinous, ?d/c at discharge? 8.  Will keep today and if wound is improved discharge home tomorrow.    LOS: 6 days    Nat Christen 08/09/2015, 9:02 AM Pager: [phone]

## 2015-08-10 ENCOUNTER — Inpatient Hospital Stay (HOSPITAL_COMMUNITY): Payer: Commercial Managed Care - HMO

## 2015-08-10 LAB — CREATININE, SERUM
Creatinine, Ser: 0.46 mg/dL (ref 0.44–1.00)
GFR calc non Af Amer: >60 mL/min (ref >60)

## 2015-08-10 MED ORDER — METRONIDAZOLE 500 MG PO TABS
500.0000 mg | ORAL_TABLET | Freq: Three times a day (TID) | ORAL | Status: DC
  Administered 2015-08-10 – 2015-08-11 (×5): 500 mg via ORAL
  Filled 2015-08-10 (×5): qty 1

## 2015-08-10 MED ORDER — HYDROMORPHONE HCL 1 MG/ML IJ SOLN
1.0000 mg | Freq: Once | INTRAMUSCULAR | Status: AC
  Administered 2015-08-10: 1 mg via INTRAMUSCULAR
  Filled 2015-08-10: qty 1

## 2015-08-10 MED ORDER — GLUCERNA SHAKE PO LIQD
237.0000 mL | Freq: Three times a day (TID) | ORAL | Status: DC
  Administered 2015-08-10 – 2015-08-11 (×2): 237 mL via ORAL

## 2015-08-10 MED ORDER — IOHEXOL 300 MG/ML  SOLN
25.0000 mL | INTRAMUSCULAR | Status: AC
  Administered 2015-08-10 (×2): 25 mL via ORAL

## 2015-08-10 MED ORDER — CIPROFLOXACIN HCL 500 MG PO TABS
500.0000 mg | ORAL_TABLET | Freq: Two times a day (BID) | ORAL | Status: DC
  Administered 2015-08-10 – 2015-08-11 (×3): 500 mg via ORAL
  Filled 2015-08-10 (×3): qty 1

## 2015-08-10 MED ORDER — DIPHENHYDRAMINE HCL 25 MG PO CAPS
50.0000 mg | ORAL_CAPSULE | Freq: Every evening | ORAL | Status: DC | PRN
  Administered 2015-08-11: 50 mg via ORAL
  Filled 2015-08-10: qty 2

## 2015-08-10 NOTE — Care Management Note (Signed)
Case Management Note  Patient Details  Name: [name] MRN: [id number] Date of Birth: 02-Jun-1966  Subjective/Objective:     Pt s/p Rt colectomy and colon resection on 08/03/15.  PTA, pt independent of ADLS.  Pt will need HH follow up at dc for wound care.                 Action/Plan: Met with pt to discuss home arrangements.  She has used home care in the past, and wishes to use [facility] for [facility] needs.  Referral to [facility] for [facility] follow up.  Start of care 24-48h post dc date.    Expected Discharge Date:   08/12/2015               Expected Discharge Plan:  [place]  In-House Referral:     Discharge planning Services  CM Consult  Post Acute Care Choice:    Choice offered to:  Patient  DME Arranged:    DME Agency:     HH Arranged:  RN [place] Agency:  [facility]  Status of Service:  Completed, signed off  Medicare Important Message Given:    Date Medicare IM Given:    Medicare IM give by:    Date Additional Medicare IM Given:    Additional Medicare Important Message give by:     If discussed at [place] of Stay Meetings, dates discussed:    Additional Comments:  Reinaldo Raddle, RN, BSN  Trauma/Neuro ICU Case Manager [phone]

## 2015-08-10 NOTE — Progress Notes (Signed)
Patient ID: [name], female   DOB: 04-03-1966, 50 y.o.   MRN: [id number] 7 Days Post-Op  Subjective: Pt feels ok, but still having issues and drainage from her wound.  Eating some, but not a great appetite due to foul smelling wound.  Objective: Vital signs in last 24 hours: Temp:  [98.4 F (36.9 C)-101 F (38.3 C)] 98.7 F (37.1 C) (01/16 0515) Pulse Rate:  [73-103] 103 (01/16 0515) Resp:  [15-18] 15 (01/16 0515) BP: (117-180)/(72-87) 117/72 mmHg (01/16 0515) SpO2:  [97 %-100 %] 100 % (01/16 0515) Last BM Date: 08/09/15  Intake/Output from previous day: 01/15 0701 - 01/16 0700 In: 770 [P.O.:360; I.V.:350; IV Piggyback:60] Out: 40 [Drains:40] Intake/Output this shift:    PE: Abd: soft, wound with grossly purulent drainage from her wound.  All staples removed and a good deal of foul smelling drainage was evacuated.  The base of her wound is intact, but some necrosis at the base.  Her JP drain remains serous in nature.   Heart: regular Lungs: CTAB  Lab Results:   Recent Labs  08/09/15 1030  WBC 6.5  HGB 9.2*  HCT 29.0*  PLT 292   BMET  Recent Labs  08/10/15 0645  CREATININE 0.46   PT/INR No results for input(s): LABPROT, INR in the last 72 hours. CMP     Component Value Date/Time   NA 134* 08/06/2015 0427   K 3.9 08/06/2015 0427   CL 104 08/06/2015 0427   CO2 21* 08/06/2015 0427   GLUCOSE 122* 08/06/2015 0427   BUN <5* 08/06/2015 0427   CREATININE 0.46 08/10/2015 0645   CALCIUM 9.0 08/06/2015 0427   PROT 6.8 07/29/2015 1428   ALBUMIN 3.5 07/29/2015 1428   AST 22 07/29/2015 1428   ALT 17 07/29/2015 1428   ALKPHOS 87 07/29/2015 1428   BILITOT 0.3 07/29/2015 1428   GFRNONAA >60 08/10/2015 0645   GFRAA >60 08/10/2015 0645   Lipase     Component Value Date/Time   LIPASE 14* 04/01/2015 2140       Studies/Results: No results found.  Anti-infectives: Anti-infectives    Start     Dose/Rate Route Frequency Ordered Stop   08/10/15 0915   ciprofloxacin (CIPRO) tablet 500 mg     500 mg Oral 2 times daily 08/10/15 0901     08/10/15 0915  metroNIDAZOLE (FLAGYL) tablet 500 mg     500 mg Oral 3 times per day 08/10/15 0901     08/03/15 1315  ciprofloxacin (CIPRO) IVPB 400 mg     400 mg 200 mL/hr over 60 Minutes Intravenous To Surgery 08/03/15 1306 08/03/15 1339   08/03/15 1236  clindamycin (CLEOCIN) 900 MG/50ML IVPB    Comments:  Holtzman, Ariel   : cabinet override      08/03/15 1236 08/03/15 1854   08/02/15 1400  neomycin (MYCIFRADIN) tablet 1,000 mg  Status:  Discontinued     1,000 mg Oral 3 times per day 08/02/15 1138 08/03/15 1718   08/02/15 1400  erythromycin (E-MYCIN) tablet 1,000 mg  Status:  Discontinued     1,000 mg Oral 3 times per day 08/02/15 1138 08/03/15 1718       Assessment/Plan   Cecal perforation and RLQ abscess POD #7 open right colectomy - Dr. Donne [name] 08/03/15 -wound infection, all staples removed.  TID NS WD dressing changes initiated. -continue JP drain, but this can likely be DC soon -fever of 101 overnight along with wound infection, will start on cipro/Flagyl -arrange for [facility] RN  for wound care at home -mobilize and pulm toilet -SLIV -scds/lovenox -will need outpatient follow up with oncology  LOS: 7 days    Tobyn Osgood E 08/10/2015, 9:02 AM Pager: [phone]

## 2015-08-11 LAB — CBC
HEMATOCRIT: 27 % — AB (ref 36.0–46.0)
HEMOGLOBIN: 8.7 g/dL — AB (ref 12.0–15.0)
MCH: 24.3 pg — AB (ref 26.0–34.0)
MCHC: 32.2 g/dL (ref 30.0–36.0)
MCV: 75.4 fL — AB (ref 78.0–100.0)
Platelets: 351 10*3/uL (ref 150–400)
RBC: 3.58 MIL/uL — AB (ref 3.87–5.11)
RDW: 14.4 % (ref 11.5–15.5)
WBC: 7.9 10*3/uL (ref 4.0–10.5)

## 2015-08-11 LAB — BASIC METABOLIC PANEL
Anion gap: 13 (ref 5–15)
BUN: 7 mg/dL (ref 6–20)
CHLORIDE: 96 mmol/L — AB (ref 101–111)
CO2: 25 mmol/L (ref 22–32)
Calcium: 8.7 mg/dL — ABNORMAL LOW (ref 8.9–10.3)
Creatinine, Ser: 0.51 mg/dL (ref 0.44–1.00)
GFR calc Af Amer: >60 mL/min (ref >60)
GFR calc non Af Amer: >60 mL/min (ref >60)
GLUCOSE: 104 mg/dL — AB (ref 65–99)
POTASSIUM: 3.5 mmol/L (ref 3.5–5.1)
Sodium: 134 mmol/L — ABNORMAL LOW (ref 135–145)

## 2015-08-11 MED ORDER — TRAMADOL HCL 50 MG PO TABS: 50.0000 mg | ORAL_TABLET | Freq: Four times a day (QID) | ORAL | Status: DC | PRN

## 2015-08-11 MED ORDER — CIPROFLOXACIN HCL 500 MG PO TABS: 500.0000 mg | ORAL_TABLET | Freq: Two times a day (BID) | ORAL | Status: DC

## 2015-08-11 MED ORDER — CYCLOBENZAPRINE HCL 10 MG PO TABS: 10.0000 mg | ORAL_TABLET | Freq: Three times a day (TID) | ORAL | Status: DC | PRN

## 2015-08-11 MED ORDER — METRONIDAZOLE 500 MG PO TABS: 500.0000 mg | ORAL_TABLET | Freq: Three times a day (TID) | ORAL | Status: DC

## 2015-08-11 MED ORDER — OXYCODONE HCL 10 MG PO TABS: 10.0000 mg | ORAL_TABLET | ORAL | Status: DC | PRN

## 2015-08-11 NOTE — Progress Notes (Signed)
PT Cancellation Note  Patient Details Name: [name] MRN: [id number] DOB: 12-16-1965   Cancelled Treatment:    Reason Eval/Treat Not Completed: Patient declined, no reason specified Pt eating lunch. PT will check on pt later as time allows.   [place] April, PTA Pager: [phone]   08/11/2015, 1:42 PM

## 2015-08-11 NOTE — Clinical Documentation Improvement (Signed)
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  ADENOCARCINOMA EXTENDS AT LEAST INTO THE PERICOLONIC SOFT TISSUE AND IS ASSOCIATED WITH TRANSMURAL DEFECT.  THE PROXIMAL AND DISTAL RESECTION MARGINS ARE NEGATIVE FOR ADENOCARCINOMA.   ACELLULAR MUCIN IS GROSSLY PRESENT AT THE SEROSAL SURFACE.  LYMPHOVASCULAR INVASION IS IDENTIFIED.  METASTATIC CARCINOMA IN 1 OF 40 LYMPH NODES (1/40).     Please exercise your independent, professional judgment when responding. A specific answer is not anticipated or expected.   Thank You, Erling Conte  RN BSN CCDS [phone] Health Information Management [place]

## 2015-08-11 NOTE — Discharge Instructions (Signed)
[facility]      [facility] [phone]  ABDOMINAL SURGERY: POST OP INSTRUCTIONS  Always review your discharge instruction sheet given to you by the facility where your surgery was performed.  IF YOU HAVE DISABILITY OR FAMILY LEAVE FORMS, YOU MUST BRING THEM TO THE OFFICE FOR PROCESSING.  PLEASE DO NOT GIVE THEM TO YOUR DOCTOR.  1. A prescription for pain medication may be given to you upon discharge.  Take your pain medication as prescribed, if needed.  If narcotic pain medicine is not needed, then you may take acetaminophen (Tylenol) or ibuprofen (Advil) as needed. 2. Take your usually prescribed medications unless otherwise directed. 3. If you need a refill on your pain medication, please contact your pharmacy. They will contact our office to request authorization.  Prescriptions will not be filled after 5pm or on week-ends. 4. You should follow a light diet the first few days after arrival home, such as soup and crackers, pudding, etc.unless your doctor has advised otherwise. A high-fiber, low fat diet can be resumed as tolerated.   Be sure to include lots of fluids daily. Most patients will experience some swelling and bruising on the chest and neck area.  Ice packs will help.  Swelling and bruising can take several days to resolve 5. Most patients will experience some swelling and bruising in the area of the incision. Ice pack will help. Swelling and bruising can take several days to resolve..  6. It is common to experience some constipation if taking pain medication after surgery.  Increasing fluid intake and taking a stool softener will usually help or prevent this problem from occurring.  A mild laxative (Milk of Magnesia or Miralax) should be taken according to package directions if there are no bowel movements after 48 hours. 7.  You may have steri-strips (small skin tapes) in place directly over the incision.  These strips should be left on the skin for 7-10 days.  If your  surgeon used skin glue on the incision, you may shower in 24 hours.  The glue will flake off over the next 2-3 weeks.  Any sutures or staples will be removed at the office during your follow-up visit. You may find that a light gauze bandage over your incision may keep your staples from being rubbed or pulled. You may shower and replace the bandage daily. 8. ACTIVITIES:  You may resume regular (light) daily activities beginning the next day--such as daily self-care, walking, climbing stairs--gradually increasing activities as tolerated.  You may have sexual intercourse when it is comfortable.  Refrain from any heavy lifting or straining until approved by your doctor. a. You may drive when you no longer are taking prescription pain medication, you can comfortably wear a seatbelt, and you can safely maneuver your car and apply brakes b. Return to Work: ___________________________________ 9. You should see your doctor in the office for a follow-up appointment approximately two weeks after your surgery.  Make sure that you call for this appointment within a day or two after you arrive home to insure a convenient appointment time. OTHER INSTRUCTIONS:  _____________________________________________________________ _____________________________________________________________  WHEN TO CALL YOUR DOCTOR: 1. Fever over 101.0 2. Inability to urinate 3. Nausea and/or vomiting 4. Extreme swelling or bruising 5. Continued bleeding from incision. 6. Increased pain, redness, or drainage from the incision. 7. Difficulty swallowing or breathing 8. Muscle cramping or spasms. 9. Numbness or tingling in hands or feet or around lips.  The clinic staff is available to answer   your questions during regular business hours.  Please don't hesitate to call and ask to speak to one of the nurses if you have concerns.  For further questions, please visit [email or web address]   

## 2015-08-11 NOTE — Progress Notes (Signed)
8 Days Post-Op  Subjective: Having bowel function, tol diet, pain with dressing changes she correlates to packing  Objective: Vital signs in last 24 hours: Temp:  [98.3 F (36.8 C)-99.6 F (37.6 C)] 98.3 F (36.8 C) (01/17 0548) Pulse Rate:  [101-109] 101 (01/17 0548) Resp:  [16-17] 17 (01/17 0548) BP: (112-131)/(67-81) 131/81 mmHg (01/17 0548) SpO2:  [99 %-100 %] 100 % (01/17 0548) Last BM Date: 08/09/15  Intake/Output from previous day: 01/16 0701 - 01/17 0700 In: 240 [P.O.:240] Out: 1250 [Urine:1225; Drains:25] Intake/Output this shift:    Resp: clear to auscultation bilaterally Cardio: regular rate and rhythm GI: wound with necrotic tissue but not really able to debride right now, no obvious dehiscence, not much drainage this am, bs present, no infection  Lab Results:   Recent Labs  08/09/15 1030 08/11/15 0013  WBC 6.5 7.9  HGB 9.2* 8.7*  HCT 29.0* 27.0*  PLT 292 351   BMET  Recent Labs  08/10/15 0645 08/11/15 0013  NA  --  134*  K  --  3.5  CL  --  96*  CO2  --  25  GLUCOSE  --  104*  BUN  --  7  CREATININE 0.46 0.51  CALCIUM  --  8.7*   PT/INR No results for input(s): LABPROT, INR in the last 72 hours. ABG No results for input(s): PHART, HCO3 in the last 72 hours.  Invalid input(s): PCO2, PO2  Studies/Results: Ct Abdomen Pelvis Wo Contrast  08/10/2015  CLINICAL DATA:  50 year old female with operative wound drainage and fever. Patient is 7 days postop status post exploratory laparotomy and right hemicolectomy for persistent right lower quadrant abscess after perforated appendicitis complicated by cecal perforation and despite attempted percutaneous drainage. EXAM: CT ABDOMEN AND PELVIS WITHOUT CONTRAST TECHNIQUE: Multidetector CT imaging of the abdomen and pelvis was performed following the standard protocol without IV contrast. COMPARISON:  Most recent prior CT abdomen/ pelvis 07/02/2015 and 04/01/2015 FINDINGS: Lower Chest: Stable 5 mm nodule in  the right lower lobe. Trace dependent atelectasis in the right lower lobe. Otherwise, the visualized lower lungs are clear. Visualized cardiac mediastinal contours within normal limits. No pericardial effusion. Calcifications along the mitral valve annulus. Abdomen: Unenhanced is surgical changes of prior gastric bypass procedure. No evidence of upper abdominal abscess or bowel obstruction. Unremarkable CT appearance of the spleen, adrenal glands and pancreas. Normal hepatic contour and morphology. Gallbladder is distended but does not appear inflamed. Unremarkable appearance of the bilateral kidneys. No focal solid lesion, hydronephrosis or nephrolithiasis. Surgical changes of right hemicolectomy. The ileocolonic anastomosis in the right upper quadrant in the region of the hepatic flexure. This is widely patent. No evidence of bowel obstruction or intra-abdominal abscess. JP drainage catheter remains present within the right lower quadrant. Pelvis: Surgical changes of prior hysterectomy. The bladder is decompressed. Bones/Soft Tissues: Open midline abdominal wound containing packing gauze. There is associated inflammatory stranding. Small locules of air track into the rectus abdominis musculature but did not extend into the peritoneum. Small fat containing umbilical hernia. No acute fracture or aggressive appearing lytic or blastic osseous lesion. Vascular: Limited evaluation in the absence of IV contrast. No aneurysm. IMPRESSION: 1. CT findings consistent with clinical history of midline incision wound infection. Locules of air track deep into the rectus abdominis musculature but do not appear to cross into the peritoneum. No evidence of intra-abdominal abscess or anastomotic leak. 2. Surgical changes prior gastric bypass surgery and right hemicolectomy with ileocolonic anastomosis. All anastomoses appear widely  patent and intact. 3. Stable 5 mm nodule in the right lower lobe compared to 04/01/2015. 4. JP drain in  the right lower quadrant. Electronically Signed   By: Jacqulynn Cadet M.D.   On: 08/10/2015 17:12    Anti-infectives: Anti-infectives    Start     Dose/Rate Route Frequency Ordered Stop   08/10/15 0915  ciprofloxacin (CIPRO) tablet 500 mg     500 mg Oral 2 times daily 08/10/15 0901     08/10/15 0915  metroNIDAZOLE (FLAGYL) tablet 500 mg     500 mg Oral 3 times per day 08/10/15 0901     08/03/15 1315  ciprofloxacin (CIPRO) IVPB 400 mg     400 mg 200 mL/hr over 60 Minutes Intravenous To Surgery 08/03/15 1306 08/03/15 1339   08/03/15 1236  clindamycin (CLEOCIN) 900 MG/50ML IVPB    Comments:  Holtzman, Ariel   : cabinet override      08/03/15 1236 08/03/15 1854   08/02/15 1400  neomycin (MYCIFRADIN) tablet 1,000 mg  Status:  Discontinued     1,000 mg Oral 3 times per day 08/02/15 1138 08/03/15 1718   08/02/15 1400  erythromycin (E-MYCIN) tablet 1,000 mg  Status:  Discontinued     1,000 mg Oral 3 times per day 08/02/15 1138 08/03/15 1718      Assessment/Plan: POD #8 open right colectomy  -wound infection, ct scan without ia pathology, will continue dressing changes -dc drain - will send home on one week abx after starting -arrange for [facility] RN for wound care at home -mobilize and pulm toilet -SLIV -scds/lovenox -will need outpatient follow up with oncology -dc today  [facility] 08/11/2015

## 2015-08-13 ENCOUNTER — Encounter (HOSPITAL_COMMUNITY): Payer: Self-pay

## 2015-08-13 ENCOUNTER — Telehealth: Payer: Self-pay | Admitting: *Deleted

## 2015-08-13 NOTE — Telephone Encounter (Signed)
Oncology Nurse Navigator Documentation  Oncology Nurse Navigator Flowsheets 08/13/2015  Navigator Location [facility]  Navigator Encounter Type Introductory phone call  Abnormal Finding Date 04/02/2015  Confirmed Diagnosis Date 08/03/2015  Surgery Date 08/03/2015  Spoke with [name]  (identified himself as husband) and provided new patient appointment for 08/20/15 at 11:00 with Dr. Truitt Merle. Informed of location of [place], valet service, and registration process. Reminded to bring insurance cards and a current medication list, including supplements. He verbalizes understanding. Welcome packet mailed to home.

## 2015-08-20 ENCOUNTER — Encounter: Payer: Self-pay | Admitting: Hematology

## 2015-08-20 ENCOUNTER — Other Ambulatory Visit (HOSPITAL_BASED_OUTPATIENT_CLINIC_OR_DEPARTMENT_OTHER): Payer: Managed Care, Other (non HMO)

## 2015-08-20 ENCOUNTER — Encounter: Payer: Self-pay | Admitting: *Deleted

## 2015-08-20 ENCOUNTER — Ambulatory Visit (HOSPITAL_BASED_OUTPATIENT_CLINIC_OR_DEPARTMENT_OTHER): Payer: Commercial Managed Care - HMO | Admitting: Hematology

## 2015-08-20 VITALS — BP 120/64 | HR 104 | Temp 97.4°F | Resp 18 | Ht 61.0 in | Wt 139.3 lb

## 2015-08-20 DIAGNOSIS — C180 Malignant neoplasm of cecum: Secondary | ICD-10-CM | POA: Diagnosis not present

## 2015-08-20 DIAGNOSIS — D649 Anemia, unspecified: Secondary | ICD-10-CM

## 2015-08-20 DIAGNOSIS — M797 Fibromyalgia: Secondary | ICD-10-CM

## 2015-08-20 DIAGNOSIS — C182 Malignant neoplasm of ascending colon: Secondary | ICD-10-CM

## 2015-08-20 DIAGNOSIS — F329 Major depressive disorder, single episode, unspecified: Secondary | ICD-10-CM | POA: Diagnosis not present

## 2015-08-20 DIAGNOSIS — Z800 Family history of malignant neoplasm of digestive organs: Secondary | ICD-10-CM

## 2015-08-20 DIAGNOSIS — Z803 Family history of malignant neoplasm of breast: Secondary | ICD-10-CM

## 2015-08-20 DIAGNOSIS — Z8041 Family history of malignant neoplasm of ovary: Secondary | ICD-10-CM

## 2015-08-20 LAB — COMPREHENSIVE METABOLIC PANEL
ALK PHOS: 85 U/L (ref 40–150)
ALT: 12 U/L (ref 0–55)
AST: 17 U/L (ref 5–34)
Albumin: 3.1 g/dL — ABNORMAL LOW (ref 3.5–5.0)
Anion Gap: 10 mEq/L (ref 3–11)
BUN: 10.6 mg/dL (ref 7.0–26.0)
CO2: 26 meq/L (ref 22–29)
Calcium: 9.6 mg/dL (ref 8.4–10.4)
Chloride: 100 mEq/L (ref 98–109)
Creatinine: 0.6 mg/dL (ref 0.6–1.1)
EGFR: >90 mL/min/{1.73_m2} (ref >90)
GLUCOSE: 95 mg/dL (ref 70–140)
POTASSIUM: 4.1 meq/L (ref 3.5–5.1)
SODIUM: 136 meq/L (ref 136–145)
TOTAL PROTEIN: 7.4 g/dL (ref 6.4–8.3)

## 2015-08-20 LAB — CBC WITH DIFFERENTIAL/PLATELET
BASO%: 0.7 % (ref 0.0–2.0)
BASOS ABS: 0 10*3/uL (ref 0.0–0.1)
EOS%: 2.6 % (ref 0.0–7.0)
Eosinophils Absolute: 0.2 10*3/uL (ref 0.0–0.5)
HCT: 30.4 % — ABNORMAL LOW (ref 34.8–46.6)
HGB: 9.6 g/dL — ABNORMAL LOW (ref 11.6–15.9)
LYMPH%: 33.3 % (ref 14.0–49.7)
MCH: 23.9 pg — AB (ref 25.1–34.0)
MCHC: 31.6 g/dL (ref 31.5–36.0)
MCV: 75.6 fL — ABNORMAL LOW (ref 79.5–101.0)
MONO#: 0.4 10*3/uL (ref 0.1–0.9)
MONO%: 6.9 % (ref 0.0–14.0)
NEUT#: 3.3 10*3/uL (ref 1.5–6.5)
NEUT%: 56.5 % (ref 38.4–76.8)
PLATELETS: 471 10*3/uL — AB (ref 145–400)
RBC: 4.02 10*6/uL (ref 3.70–5.45)
RDW: 14.9 % — ABNORMAL HIGH (ref 11.2–14.5)
WBC: 5.8 10*3/uL (ref 3.9–10.3)
lymph#: 1.9 10*3/uL (ref 0.9–3.3)

## 2015-08-20 NOTE — Progress Notes (Signed)
  Oncology Nurse Navigator Documentation  Navigator Location: [facility] (08/20/15 1750) Navigator Encounter Type: Other (08/20/15 1750)  Was not available to be with patient at her new patient visit. Introduced myself to her in scheduling department and provided with new patient packet. Informed her I will touch basis with her at next appointment or she is welcome to call me prior. Provided contact information.

## 2015-08-20 NOTE — Progress Notes (Signed)
[name]  Telephone:([phone]) [phone] Fax:([phone]) [phone]  Clinic New Consult Note   Patient Care Team: Carol Ada, MD as PCP - General (Family Medicine) Rolm Bookbinder, MD as Consulting Physician (General Surgery) 08/20/2015  REFERRAL PHYSICIAN: Dr. Donne [name]   CHIEF COMPLAINTS/PURPOSE OF CONSULTATION:  Stage III colon cancer   HISTORY OF PRESENTING ILLNESS:  [name] 50 y.o. female without significant PMH is here because of her recently diagnosed stage III colon cancer. She is accompanied by her boyfriend to the clinic today.  She developed fever and headache in September 2016, was found to have ruptured appendix and was admitted to hospital. CT scan revealed a peri-appendiceal abscess. She underwent abscess drainage tube basement by interventional radiology on 04/17/2015, and a course of antibiotics. She had a multiple drainage tube exchange by IR, but the abscess did not heal. She was referred to Dr. Donne [name]. Multiple CT scan in October and November showed persistent peri-appendiceal fluid collection. She was finally taken to the operation room on 08/03/2015. Intraoperatively, she was found to have a 2 cm whole present in the cecum, she underwent ileocectomy with an anastomosis in the hepatic fixture. 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  She had wound infection issue after surgery. It has been healing slowly, she is still on wet-to-dry dressing change twice daily, has home care nurse. She has moderate pain, 6-7/10, sometime postional, she is on oxycodone 2-3 times a day. Her appetite and eating is getting better, lost about 25lb since 03/2015, no fever and chills for the past 3-4 days, just finished abx yesterday.   MEDICAL HISTORY:  Past Medical History  Diagnosis Date  . Anemia   . Depression   . Anxiety   . JP drain bleeding   . GERD (gastroesophageal reflux disease)   . Fibromyalgia     SURGICAL HISTORY: Past Surgical  History  Procedure Laterality Date  . Abdominal hysterectomy    . Gastric bypass    . Colon surgery  08/03/2015  . Laparoscopic ileocecectomy Right 08/03/2015    Procedure: LAPAROSCOPIC DIAGNOSTIC RIGHT COLECTOMY;  Surgeon: Rolm Bookbinder, MD;  Location: [facility];  Service: General;  Laterality: Right;  . Colostomy revision N/A 08/03/2015    Procedure: COLON RESECTION RIGHT;  Surgeon: Rolm Bookbinder, MD;  Location: [facility];  Service: General;  Laterality: N/A;    SOCIAL HISTORY: Social History   Social History  . Marital Status: Single    Spouse Name: N/A  . Number of Children: N/A  . Years of Education: N/A   Occupational History  . Not on file.   Social History Main Topics  . Smoking status: Never Smoker   . Smokeless tobacco: Never Used  . Alcohol Use: 1.2 oz/week    2 Glasses of wine per week     Comment: moderate 1-2 times a month   . Drug Use: No  . Sexual Activity: Not on file   Other Topics Concern  . Not on file   Social History Narrative    FAMILY HISTORY: Family History  Problem Relation Age of Onset  . Cancer Father 31    small cell lung cancer and colon cancer   . Cancer Sister 50    small cell lung cancer  . Cancer Maternal Aunt     ovarian cancer  . Cancer Paternal Uncle     brain cancer   . Cancer Other 40    ovarian cancer (maternal niece)    ALLERGIES:  is allergic  to latex; cymbalta; and penicillins.  MEDICATIONS:  Current Outpatient Prescriptions  Medication Sig Dispense Refill  . acetaminophen (TYLENOL) 500 MG tablet Take 1,000 mg by mouth every 6 (six) hours as needed for fever.    . cyclobenzaprine (FLEXERIL) 10 MG tablet Take 1 tablet (10 mg total) by mouth 3 (three) times daily as needed for muscle spasms. 30 tablet 0  . diphenhydrAMINE (BENADRYL) 25 mg capsule Take 25 mg by mouth every 6 (six) hours as needed for sleep.    Marland Kitchen escitalopram (LEXAPRO) 10 MG tablet Take 10 mg by mouth daily.    . ferrous sulfate 325 (65 FE) MG tablet  Take 325 mg by mouth 2 (two) times daily with a meal.    . oxyCODONE 10 MG TABS Take 1 tablet (10 mg total) by mouth every 4 (four) hours as needed for moderate pain. 30 tablet 0  . traMADol (ULTRAM) 50 MG tablet Take 1-2 tablets (50-100 mg total) by mouth every 6 (six) hours as needed. 30 tablet 1  . vitamin B-12 (CYANOCOBALAMIN) 1000 MCG tablet Take 500 mcg by mouth daily.    . vitamin C (ASCORBIC ACID) 500 MG tablet Take 500 mg by mouth daily.     No current facility-administered medications for this visit.   Facility-Administered Medications Ordered in Other Visits  Medication Dose Route Frequency Provider Last Rate Last Dose  . clindamycin (CLEOCIN) 900 mg in dextrose 5 % 50 mL IVPB  900 mg Intravenous 60 min Pre-Op Rolm Bookbinder, MD       And  . gentamicin (GARAMYCIN) 310 mg in dextrose 5 % 50 mL IVPB  5 mg/kg Intravenous 60 min Pre-Op Rolm Bookbinder, MD        REVIEW OF SYSTEMS:   Constitutional: Denies fevers, chills or abnormal night sweats Eyes: Denies blurriness of vision, double vision or watery eyes Ears, nose, mouth, throat, and face: Denies mucositis or sore throat Respiratory: Denies cough, dyspnea or wheezes Cardiovascular: Denies palpitation, chest discomfort or lower extremity swelling Gastrointestinal:  Denies nausea, heartburn or change in bowel habits Skin: Denies abnormal skin rashes Lymphatics: Denies new lymphadenopathy or easy bruising Neurological:Denies numbness, tingling or new weaknesses Behavioral/Psych: Mood is stable, no new changes  All other systems were reviewed with the patient and are negative.  PHYSICAL EXAMINATION: ECOG PERFORMANCE STATUS: 1 - Symptomatic but completely ambulatory  Filed Vitals:   08/20/15 1128  BP: 120/64  Pulse: 104  Temp: 97.4 F (36.3 C)  Resp: 18   Filed Weights   08/20/15 1128  Weight: 139 lb 4.8 oz (63.186 kg)    GENERAL:alert, no distress and comfortable SKIN: skin color, texture, turgor are normal,  no rashes or significant lesions EYES: normal, conjunctiva are pink and non-injected, sclera clear OROPHARYNX:no exudate, no erythema and lips, buccal mucosa, and tongue normal  NECK: supple, thyroid normal size, non-tender, without nodularity LYMPH:  no palpable lymphadenopathy in the cervical, axillary or inguinal   LUNGS: clear to auscultation and percussion with normal breathing effort HEART: regular rate & rhythm and no murmurs and no lower extremity edema ABDOMEN:abdomen soft, non-tender and normal bowel sounds,  Her surgical wound will covered by gauze Musculoskeletal:no cyanosis of digits and no clubbing  PSYCH: alert & oriented x 3 with fluent speech NEURO: no focal motor/sensory deficits  LABORATORY DATA:  I have reviewed the data as listed CBC Latest Ref Rng 08/11/2015 08/09/2015 08/04/2015  WBC 4.0 - 10.5 K/uL 7.9 6.5 12.8(H)  Hemoglobin 12.0 - 15.0 g/dL 8.7(L) 9.2(L)  10.3(L)  Hematocrit 36.0 - 46.0 % 27.0(L) 29.0(L) 33.4(L)  Platelets 150 - 400 K/uL 351 292 214      Recent Labs  04/01/15 2140  06/16/15 1825 07/29/15 1428 08/04/15 0346 08/06/15 0427 08/10/15 0645 08/11/15 0013  NA 133*  < > 136 140 136 134*  --  134*  K 3.8  < > 4.1 4.4 3.8 3.9  --  3.5  CL 96*  < > 102 106 103 104  --  96*  CO2 24  < > _0 21*  --  25  GLUCOSE 96  < > 86 86 157* 122*  --  104*  BUN 10  < > 9 7 5* <5*  --  7  CREATININE 0.58  < > 0.58 0.46 0.63 0.45 0.46 0.51  CALCIUM 9.6  < > 9.2 9.1 8.6* 9.0  --  8.7*  GFRNONAA >60  < > >60 >60 >60 >60 >60 >60  GFRAA >60  < > >60 >60 >60 >60 >60 >60  PROT 8.0  --  8.3* 6.8  --   --   --   --   ALBUMIN 3.6  --  3.8 3.5  --   --   --   --   AST 20  --  15 22  --   --   --   --   ALT 16  --  11* 17  --   --   --   --   ALKPHOS 107  --  123 87  --   --   --   --   BILITOT 0.8  --  0.3 0.3  --   --   --   --   < > = values in this interval not displayed.  PATHOLOGY REPORT Diagnosis 08/03/2015 Colon, segmental resection, Right - INVASIVE  ADENOCARCINOMA WITH ABUNDANT EXTRACELLULAR MUCIN, MODERATELY DIFFERENTIATED, LIKELY ARISING FROM ILEOCECAL VALVE. - ADENOCARCINOMA EXTENDS AT LEAST INTO THE PERICOLONIC SOFT TISSUE AND IS ASSOCIATED WITH TRANSMURAL DEFECT. - THE PROXIMAL AND DISTAL RESECTION MARGINS ARE NEGATIVE FOR ADENOCARCINOMA. - ACELLULAR MUCIN IS GROSSLY PRESENT AT THE SEROSAL SURFACE. - LYMPHOVASCULAR INVASION IS IDENTIFIED. - METASTATIC CARCINOMA IN 1 OF 40 LYMPH NODES (1/40). - SEE ONCOLOGY TABLE BELOW. Microscopic Comment COLON AND RECTUM (INCLUDING TRANS-ANAL RESECTION): Specimen: Right colon and terminal ileum. Procedure: Resection. Tumor site: Likely ileocecal valve. Specimen integrity: Transmural defect(s). Macroscopic intactness of mesorectum: N/A Macroscopic tumor perforation: Present. Invasive tumor: Maximum size: At least 2.5 cm. Histologic type(s): Adenocarcinoma with abundant extracellular mucin. Histologic grade and differentiation: G2: moderately differentiated. Type of polyp in which invasive carcinoma arose: Tubular adenoma. Microscopic extension of invasive tumor: Adenocarcinoma extends into pericolonic soft tissue and acellular mucin is grossly present at the serosal surface. Lymph-Vascular invasion: Present. Peri-neural invasion: Not identified. Tumor deposit(s) (discontinuous extramural extension): Not identified. Resection margins: Proximal margin: Negative for carcinoma. Distal margin: Negative for carcinoma. Circumferential (radial) (posterior ascending, posterior descending; lateral and posterior mid-rectum; and entire lower 1/3 rectum): Acellular mucin is present at the circumferential tissue edge. Treatment effect (neo-adjuvant therapy): N/A Additional polyp(s): Not identified. Non-neoplastic findings: No significant findings. Lymph nodes: number examined 40; number positive: 1 Pathologic Staging: at least pT4a, pN1a. Ancillary studies: MMR by IHC and MSI by PCR will be performed  and the results reported separately. Additional studies can be performed upon clinician request.   ADDITIONAL INFORMATION: Mismatch Repair (MMR) Protein Immunohistochemistry (IHC) IHC Expression Result: MLH1: EQUIVOCAL MSH2: EQUIVOCAL MSH6: EQUIVOCAL PMS2: EQUIVOCAL *  Internal control demonstrates intact nuclear expression Interpretation: EQUIVOCAL The tumor shows partial staining with all anibodies and hence the results are considered equivocal. Correlated with molecular based MSI testing is strongly recommended.    RADIOGRAPHIC STUDIES: I have personally reviewed the radiological images as listed and agreed with the findings in the report. Ct Abdomen Pelvis Wo Contrast  08/10/2015  CLINICAL DATA:  50 year old female with operative wound drainage and fever. Patient is 7 days postop status post exploratory laparotomy and right hemicolectomy for persistent right lower quadrant abscess after perforated appendicitis complicated by cecal perforation and despite attempted percutaneous drainage. EXAM: CT ABDOMEN AND PELVIS WITHOUT CONTRAST TECHNIQUE: Multidetector CT imaging of the abdomen and pelvis was performed following the standard protocol without IV contrast. COMPARISON:  Most recent prior CT abdomen/ pelvis 07/02/2015 and 04/01/2015 FINDINGS: Lower Chest: Stable 5 mm nodule in the right lower lobe. Trace dependent atelectasis in the right lower lobe. Otherwise, the visualized lower lungs are clear. Visualized cardiac mediastinal contours within normal limits. No pericardial effusion. Calcifications along the mitral valve annulus. Abdomen: Unenhanced is surgical changes of prior gastric bypass procedure. No evidence of upper abdominal abscess or bowel obstruction. Unremarkable CT appearance of the spleen, adrenal glands and pancreas. Normal hepatic contour and morphology. Gallbladder is distended but does not appear inflamed. Unremarkable appearance of the bilateral kidneys. No focal solid  lesion, hydronephrosis or nephrolithiasis. Surgical changes of right hemicolectomy. The ileocolonic anastomosis in the right upper quadrant in the region of the hepatic flexure. This is widely patent. No evidence of bowel obstruction or intra-abdominal abscess. JP drainage catheter remains present within the right lower quadrant. Pelvis: Surgical changes of prior hysterectomy. The bladder is decompressed. Bones/Soft Tissues: Open midline abdominal wound containing packing gauze. There is associated inflammatory stranding. Small locules of air track into the rectus abdominis musculature but did not extend into the peritoneum. Small fat containing umbilical hernia. No acute fracture or aggressive appearing lytic or blastic osseous lesion. Vascular: Limited evaluation in the absence of IV contrast. No aneurysm. IMPRESSION: 1. CT findings consistent with clinical history of midline incision wound infection. Locules of air track deep into the rectus abdominis musculature but do not appear to cross into the peritoneum. No evidence of intra-abdominal abscess or anastomotic leak. 2. Surgical changes prior gastric bypass surgery and right hemicolectomy with ileocolonic anastomosis. All anastomoses appear widely patent and intact. 3. Stable 5 mm nodule in the right lower lobe compared to 04/01/2015. 4. JP drain in the right lower quadrant. Electronically Signed   By: Jacqulynn Cadet M.D.   On: 08/10/2015 17:12    ASSESSMENT & PLAN:  50 year old Caucasian female, with past medical history of fibromyalgia, depression, presented with ruptured cecum and pericolonic abscess, required multiple drainage, antibiotics, and eventually right colectomy.  1. Right colon cancer, cecum, moderately differentiated adenocarcinoma, pT4N1aMx, at least stage IIIA, with perforation, MSI-stable  - I reviewed her skin findings, and surgical pathology results in great details with patient and her boyfriend. - I'll obtain a CT chest to  complete staging in the ruled out metastasis. - she has locally advanced stage III  Disease, with particular high risk features of T4 tumor, proliferation for 3-4 months,  And positive lymph nodes, she is at very high risk for cancer recurrence, especially peritoneal carcinomatosis. - I strongly recommend her to consider adjuvant chemotherapy,  Which is the standard of care for stage III colon cancer. She is young, with good health, would be a good candidate for chemotherapy.  The  only issue is her wound healing, which may  delay her adjuvant chemotherapy. - I discussed the adjuvant chemotherapy option of FOLFOX and Capeox,  Potential side effects,  And logistics  Of the treatment, were discussed with her in great details. - she is interested in adjuvant chemotherapy, but has not decided  Which regimen she would like to pursue.  I encouraged her to come in for chemotherapy class, and  Let me know about her decision. - I was not able to look at her abdominal wound,  And asked her to take a picture during her dressing changeand show me on next time.  2. Genetics - given her young age and multiple family history of colon, breast and ovary cancer, I'll refer her to genetics to ruled out  Inheritable  Cancer syndrome   3. Anemia - likely secondary to surgery,  Chronic infection and GI bleeding from the tumor - repeat CBC and our study - I recommend her to take iron pill over-the-counter 1-2 tablets a day   4. Fibromyalgia and depression - she will continue medication, and follow-up with her primary care physician  Plan -lab today  - CT chest - chemotherapy class - she will call me about her decision on chemotherapy regiment, FOLFOX versus Capeox - I'll see her back in 2 weeks, will start her chemotherapy as soon as her wound is near healed.  All questions were answered. The patient knows to call the clinic with any problems, questions or concerns. I spent 55 minutes counseling the patient face  to face. The total time spent in the appointment was 60 minutes and more than 50% was on counseling.     Truitt Merle, MD 08/20/2015

## 2015-08-21 ENCOUNTER — Telehealth: Payer: Self-pay | Admitting: *Deleted

## 2015-08-21 ENCOUNTER — Encounter: Payer: Self-pay | Admitting: *Deleted

## 2015-08-21 ENCOUNTER — Encounter: Payer: Self-pay | Admitting: Hematology

## 2015-08-21 DIAGNOSIS — C182 Malignant neoplasm of ascending colon: Secondary | ICD-10-CM

## 2015-08-21 LAB — CEA: CEA1: 1.3 ng/mL (ref 0.0–4.7)

## 2015-08-21 NOTE — Telephone Encounter (Signed)
Oncology Nurse Navigator Documentation  Oncology Nurse Navigator Flowsheets 08/21/2015  Navigator Location [facility]  Navigator Encounter Type Telephone  Telephone Outgoing Call  Abnormal Finding Date -  Confirmed Diagnosis Date -  Surgery Date -  Treatment Phase Pre-Tx/Tx Discussion  Barriers/Navigation Needs Family concerns--ability to work during treatment;times for appointments so fiance' misses less work  Interventions Coordination of Care-scheduled for chemo class on 08/24/15  Coordination of Care Radiology-put in order for CT chest w/request to schedule as late in day as possible  Acuity Level 2  Time Spent with Patient 15  Called patient to discuss getting her in a chemo class and also made her aware that her CEA is normal and lower. Also that her Hgb is improving. She wants to try to work through as much of her treatment as possible. She wants to have treatment on Thursday with pump d/c on Saturday. Told her to let scheduling know this and physician when the chemo appointments are made.

## 2015-08-24 ENCOUNTER — Other Ambulatory Visit: Payer: Managed Care, Other (non HMO)

## 2015-08-24 NOTE — Discharge Summary (Signed)
Physician Discharge Summary  Patient ID: [name] MRN: [id number] DOB/AGE: Oct 29, 1965 50 y.o.  Admit date: 08/03/2015 Discharge date: 08/24/2015  Admission Diagnoses: Perforated appendicitis Fibromyalgia Anxiety  Discharge Diagnoses:  Active Problems:   S/P colectomy colon cancer Wound infection As above  Discharged Condition: fair  Hospital Course: 28 yof who presented in 2016 with rlq abscess. This appeared to be perforated appendicitis. This was treated conservatively and eventually realized this was not going to heal. She had persistent fistula to cecum. I discussed exploration with her and we waited until after holidays.  She then underwent right colectomy which was uneventful.  Her pathology is invasive colonic adenocarcinoma with 1/40 nodes positive.Postoperatively she had an ileus that slowly resolved as well as developed drainage from her wound. This was opened for infection and she is undergoing dressing changes. She was discharged home on regular diet with home health for dressings.   Consults: None  Significant Diagnostic Studies: none  Treatments: surgery: right colectomy    Disposition: 01-Home or Self Care     Medication List    STOP taking these medications        Normal Saline Flush 0.9 % Soln      TAKE these medications        acetaminophen 500 MG tablet  Commonly known as:  TYLENOL  Take 1,000 mg by mouth every 6 (six) hours as needed for fever.     cyclobenzaprine 10 MG tablet  Commonly known as:  FLEXERIL  Take 1 tablet (10 mg total) by mouth 3 (three) times daily as needed for muscle spasms.     diphenhydrAMINE 25 mg capsule  Commonly known as:  BENADRYL  Take 25 mg by mouth every 6 (six) hours as needed for sleep.     escitalopram 10 MG tablet  Commonly known as:  LEXAPRO  Take 10 mg by mouth daily.     ferrous sulfate 325 (65 FE) MG tablet  Take 325 mg by mouth 2 (two) times daily with a meal.     Oxycodone HCl 10 MG Tabs  Take  1 tablet (10 mg total) by mouth every 4 (four) hours as needed for moderate pain.     traMADol 50 MG tablet  Commonly known as:  ULTRAM  Take 1-2 tablets (50-100 mg total) by mouth every 6 (six) hours as needed.     vitamin B-12 1000 MCG tablet  Commonly known as:  CYANOCOBALAMIN  Take 500 mcg by mouth daily.     vitamin C 500 MG tablet  Commonly known as:  ASCORBIC ACID  Take 500 mg by mouth daily.           Follow-up Information    Follow up with [facility], MD In 1 week.   Specialty:  General Surgery   Contact information:   [address] [place] [address] [phone]       Signed: Rolm Bookbinder 08/24/2015, 10:21 AM

## 2015-08-27 ENCOUNTER — Other Ambulatory Visit: Payer: Self-pay | Admitting: Hematology

## 2015-08-27 ENCOUNTER — Telehealth: Payer: Self-pay | Admitting: *Deleted

## 2015-08-27 NOTE — Telephone Encounter (Signed)
Oncology Nurse Navigator Documentation  Oncology Nurse Navigator Flowsheets 08/27/2015  Navigator Location [facility]  Navigator Encounter Type Telephone  Telephone Outgoing Call  Abnormal Finding Date -  Confirmed Diagnosis Date -  Surgery Date -  Treatment Phase -  Barriers/Navigation Needs Coordination of Care-chemo class  Interventions Coordination of Care--scheduled for chemo teaching class 2/3 at 0930  Coordination of Care Appts  Acuity -  Time Spent with Patient 15  Were not able to make the prior class due to moving to a new apartment. She will update the contact information tomorrow. After chemo class hope to be able to make their decision on tx: CAEPOX vs FOLFOX.

## 2015-08-28 ENCOUNTER — Other Ambulatory Visit: Payer: Managed Care, Other (non HMO)

## 2015-08-28 ENCOUNTER — Telehealth: Payer: Self-pay | Admitting: Hematology

## 2015-08-28 ENCOUNTER — Telehealth: Payer: Self-pay | Admitting: *Deleted

## 2015-08-28 ENCOUNTER — Ambulatory Visit (HOSPITAL_COMMUNITY): Payer: Managed Care, Other (non HMO)

## 2015-08-28 NOTE — Telephone Encounter (Signed)
per pof to sch pt appt-gave pt copy of avs °

## 2015-08-28 NOTE — Telephone Encounter (Signed)
per pof to sch pt appt-cld & spoke to pt and adv of appt on 2/10-pt understood

## 2015-08-28 NOTE — Telephone Encounter (Signed)
Oncology Nurse Navigator Documentation  Oncology Nurse Navigator Flowsheets 08/28/2015  Navigator Location [facility]  Navigator Encounter Type Telephone  Telephone Outgoing Call--left VM that managed care is still working on the precert--[employer] can be difficult to work with  Abnormal Finding Date -  Confirmed Diagnosis Date -  Surgery Date -  Treatment Phase -  Barriers/Navigation Needs Coordination of Care--needs CT chest (awaiting precert)  Interventions Coordination of Care---message to managed care  Coordination of Care Radiology  Acuity -  Time Spent with Patient 15  CT scan for 4 pm today was cancelled since prior authorization had not yet come through. Called back and let Maryl know on VM that we will be working on this and get scan scheduled as soon as approved. Apologized for the delay.

## 2015-08-31 ENCOUNTER — Telehealth: Payer: Self-pay | Admitting: *Deleted

## 2015-08-31 NOTE — Telephone Encounter (Signed)
Oncology Nurse Navigator Documentation  Oncology Nurse Navigator Flowsheets 08/31/2015  Navigator Location [facility]  Navigator Encounter Type Telephone  Telephone Outgoing Call-informed significant other, Audry Pili that [facility] has finally approved her CT chest  Abnormal Finding Date -  Confirmed Diagnosis Date -  Surgery Date -  Treatment Phase -Patient still has not made a treatment decision.  Barriers/Navigation Needs Coordination of Care  Interventions Coordination of Care--scheduled CT at [facility] [address] 09/03/15 at 3:45/4:00pm-NPO 4 hours prior--appointment was accepted.  Coordination of Care Radiology  Acuity -  Time Spent with Patient 15

## 2015-09-02 ENCOUNTER — Encounter: Payer: Self-pay | Admitting: *Deleted

## 2015-09-02 NOTE — Progress Notes (Signed)
[facility] Work  Holiday representative received referral for advance directives.  CSW contacted patient at home to schedule appointment to complete advance directives.  CSW was unable to reach patient, but left a message encouraging patient to call back to schedule an appointment.   [name], MSW, LCSW, OSW-C Clinical Social Worker [facility] [phone]

## 2015-09-03 ENCOUNTER — Ambulatory Visit (HOSPITAL_COMMUNITY)
Admission: RE | Admit: 2015-09-03 | Discharge: 2015-09-03 | Disposition: A | Discharge status: Home / Self Care | Payer: Managed Care, Other (non HMO) | Source: Ambulatory Visit | Attending: Hematology | Admitting: Hematology

## 2015-09-03 DIAGNOSIS — R918 Other nonspecific abnormal finding of lung field: Secondary | ICD-10-CM | POA: Insufficient documentation

## 2015-09-03 DIAGNOSIS — C182 Malignant neoplasm of ascending colon: Secondary | ICD-10-CM | POA: Insufficient documentation

## 2015-09-04 ENCOUNTER — Ambulatory Visit: Payer: Managed Care, Other (non HMO) | Admitting: Nutrition

## 2015-09-04 ENCOUNTER — Telehealth: Payer: Self-pay | Admitting: Hematology

## 2015-09-04 ENCOUNTER — Encounter: Payer: Self-pay | Admitting: Hematology

## 2015-09-04 ENCOUNTER — Ambulatory Visit (HOSPITAL_BASED_OUTPATIENT_CLINIC_OR_DEPARTMENT_OTHER): Payer: Managed Care, Other (non HMO) | Admitting: Hematology

## 2015-09-04 ENCOUNTER — Other Ambulatory Visit (HOSPITAL_BASED_OUTPATIENT_CLINIC_OR_DEPARTMENT_OTHER): Payer: Managed Care, Other (non HMO)

## 2015-09-04 VITALS — BP 112/85 | HR 126 | Temp 98.4°F | Resp 18 | Ht 61.0 in | Wt 135.8 lb

## 2015-09-04 DIAGNOSIS — C182 Malignant neoplasm of ascending colon: Secondary | ICD-10-CM

## 2015-09-04 DIAGNOSIS — C779 Secondary and unspecified malignant neoplasm of lymph node, unspecified: Secondary | ICD-10-CM

## 2015-09-04 DIAGNOSIS — C180 Malignant neoplasm of cecum: Secondary | ICD-10-CM | POA: Diagnosis not present

## 2015-09-04 DIAGNOSIS — M797 Fibromyalgia: Secondary | ICD-10-CM

## 2015-09-04 DIAGNOSIS — F329 Major depressive disorder, single episode, unspecified: Secondary | ICD-10-CM

## 2015-09-04 DIAGNOSIS — D649 Anemia, unspecified: Secondary | ICD-10-CM | POA: Diagnosis not present

## 2015-09-04 LAB — CBC WITH DIFFERENTIAL/PLATELET
BASO%: 0.6 % (ref 0.0–2.0)
BASOS ABS: 0 10*3/uL (ref 0.0–0.1)
EOS%: 4.1 % (ref 0.0–7.0)
Eosinophils Absolute: 0.2 10*3/uL (ref 0.0–0.5)
HCT: 32.1 % — ABNORMAL LOW (ref 34.8–46.6)
HGB: 9.9 g/dL — ABNORMAL LOW (ref 11.6–15.9)
LYMPH%: 32 % (ref 14.0–49.7)
MCH: 23.6 pg — AB (ref 25.1–34.0)
MCHC: 30.8 g/dL — AB (ref 31.5–36.0)
MCV: 76.4 fL — ABNORMAL LOW (ref 79.5–101.0)
MONO#: 0.4 10*3/uL (ref 0.1–0.9)
MONO%: 8 % (ref 0.0–14.0)
NEUT#: 2.6 10*3/uL (ref 1.5–6.5)
NEUT%: 55.3 % (ref 38.4–76.8)
PLATELETS: 251 10*3/uL (ref 145–400)
RBC: 4.2 10*6/uL (ref 3.70–5.45)
RDW: 16 % — ABNORMAL HIGH (ref 11.2–14.5)
WBC: 4.6 10*3/uL (ref 3.9–10.3)
lymph#: 1.5 10*3/uL (ref 0.9–3.3)

## 2015-09-04 LAB — COMPREHENSIVE METABOLIC PANEL
ALK PHOS: 122 U/L (ref 40–150)
ALT: 9 U/L (ref 0–55)
AST: 15 U/L (ref 5–34)
Albumin: 3.4 g/dL — ABNORMAL LOW (ref 3.5–5.0)
Anion Gap: 11 mEq/L (ref 3–11)
BUN: 5.9 mg/dL — AB (ref 7.0–26.0)
CHLORIDE: 104 meq/L (ref 98–109)
CO2: 26 mEq/L (ref 22–29)
CREATININE: 0.6 mg/dL (ref 0.6–1.1)
Calcium: 9.3 mg/dL (ref 8.4–10.4)
EGFR: >90 mL/min/{1.73_m2} (ref >90)
GLUCOSE: 89 mg/dL (ref 70–140)
POTASSIUM: 4 meq/L (ref 3.5–5.1)
SODIUM: 140 meq/L (ref 136–145)
Total Bilirubin: <0.3 mg/dL (ref 0.20–1.20)
Total Protein: 7.3 g/dL (ref 6.4–8.3)

## 2015-09-04 NOTE — Progress Notes (Signed)
50 year old female diagnosed with stage III colon cancer.  She is a patient of Dr. Burr Medico.  Past medical history includes anemia, depression, anxiety, and GERD.  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  Medications include ferrous sulfate, vitamin B12, and vitamin C  Labs include albumin 3.1.  Height: 5 feet 1 inch. Weight: 139.3 pounds January 26. Usual body weight: 148 pounds September 2016. BMI: 26.33.  Patient reports her wound infection continues but she feels as if it is healing slowly. Patient is status post surgery for burst appendix. Patient denies nutrition impact symptoms at this time. History of gastric bypass.  Patient is in the habit of eating small frequent meals and snacks that are high in protein. She consumes 3 protein shakes daily. Patient has had a 9 pound weight loss in September 2016.  Nutrition diagnosis: Food and nutrition related knowledge deficit related to new diagnosis of stage III colon cancer as evidenced by no prior need for nutrition related information.  Intervention: I educated patient to continue small frequent meals and snacks that are high in protein. Encouraged patient to continue protein shakes 3 times a day Encouraged patient to resume multivitamin, as well as other vitamin, mineral recommendations status post gastric bypass. Recommended patient add additional vitamin C, and Zinc to support healing. Questions were answered.  Teach back method was used.  Contact information was given.  Monitoring, evaluation, goals: Patient will tolerate adequate calories and protein to promote healing and minimize weight loss.  Next visit: To be scheduled as needed.  **Disclaimer: This note was dictated with voice recognition software. Similar sounding words can inadvertently be transcribed and this note may contain transcription errors which may not have been corrected upon publication of note.**

## 2015-09-04 NOTE — Progress Notes (Signed)
[facility]  [phone]) [phone] [phone]  Clinic follow up Note   Patient Care Team: Carol Ada, MD as PCP - General (Family Medicine) Rolm Bookbinder, MD as Consulting Physician (General Surgery) 09/04/2015   CHIEF COMPLAINTS:  Follow up stage III colon cancer   Oncology History   Cancer of right colon [facility])   Staging form: Colon and Rectum, AJCC 7th Edition     Pathologic stage from 08/03/2015: Stage IIIB (T4a, N1a, cM0) - Signed by Truitt Merle, MD on 09/04/2015       Cancer of right colon ([place])   03/31/2015 Imaging CT abdomen showed appendicitis, perforated, with periappendiceal abscess. And small pulmonary nodules.   04/03/2015 Procedure CT guided placement of going Through into the right lower abdominal quadrant with aspiration of a total of 30 mL prudent fluid.    08/03/2015 Initial Diagnosis Cancer of right colon ([place])   08/03/2015 Surgery Right hemicolectomy   08/03/2015 Pathology Results Right hemicolectomy showed invasive adenocarcinoma, moderately differentiated, likely arising from a cecal valve. One out of 40 lymph nodes were positive, (+) LVI, margins negative    08/03/2015 Miscellaneous colon cance MSI-stable     HISTORY OF PRESENTING ILLNESS:  [name] 50 y.o. female without significant PMH is here because of her recently diagnosed stage III colon cancer. She is accompanied by her boyfriend to the clinic today.  She developed fever and headache in September 2016, was found to have ruptured appendix and was admitted to hospital. CT scan revealed a peri-appendiceal abscess. She underwent abscess drainage tube basement by interventional radiology on 04/17/2015, and a course of antibiotics. She had a multiple drainage tube exchange by IR, but the abscess did not heal. She was referred to Dr. Donne [name]. Multiple CT scan in October and November showed persistent peri-appendiceal fluid collection. She was finally taken to the operation room on  08/03/2015. Intraoperatively, she was found to have a 2 cm whole present in the cecum, she underwent ileocectomy with an anastomosis in the hepatic fixture. 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  She had wound infection issue after surgery. It has been healing slowly, she is still on wet-to-dry dressing change twice daily, has home care nurse. She has moderate pain, 6-7/10, sometime postional, she is on oxycodone 2-3 times a day. Her appetite and eating is getting better, lost about 25lb since 03/2015, no fever and chills for the past 3-4 days, just finished abx yesterday.   CURRENT THERAPY: Pending adjuvant chemotherapy CAPEOX   INTERIM HISTORY:  [name] returns for follow-up. She has been recovering well from surgery. Her abdominal wound is improving, she changes her dressing twice a day. She showed me a picture of her wound from yesterday, which matches 5 x 2", and a 2 inches deep . The wound appears very clean, fresh granulation tissue, no discharge or surrounding skin erythema. She denies other symptoms. She has participated the chemotherapy class, and decided to take adjuvant chemotherapy regiment CAPEOX. She plans to return to work in a few weeks.   MEDICAL HISTORY:  Past Medical History  Diagnosis Date  . Anemia   . Depression   . Anxiety   . JP drain bleeding   . GERD (gastroesophageal reflux disease)   . Fibromyalgia     SURGICAL HISTORY: Past Surgical History  Procedure Laterality Date  . Abdominal hysterectomy    . Gastric bypass    . Colon surgery  08/03/2015  . Laparoscopic ileocecectomy Right 08/03/2015  Procedure: LAPAROSCOPIC DIAGNOSTIC RIGHT COLECTOMY;  Surgeon: Rolm Bookbinder, MD;  Location: [facility];  Service: General;  Laterality: Right;  . Colostomy revision N/A 08/03/2015    Procedure: COLON RESECTION RIGHT;  Surgeon: Rolm Bookbinder, MD;  Location: [facility];  Service: General;  Laterality: N/A;    SOCIAL HISTORY: Social History   Social  History  . Marital Status: Married    Spouse Name: N/A  . Number of Children: N/A  . Years of Education: N/A   Occupational History  . Not on file.   Social History Main Topics  . Smoking status: Never Smoker   . Smokeless tobacco: Never Used  . Alcohol Use: 1.2 oz/week    2 Glasses of wine per week     Comment: moderate 1-2 times a month   . Drug Use: No  . Sexual Activity: Not on file   Other Topics Concern  . Not on file   Social History Narrative   Single, fiance [name]   Works [employer] 11:30am to 8 pm    FAMILY HISTORY: Family History  Problem Relation Age of Onset  . Cancer Father 38    small cell lung cancer and colon cancer   . Cancer Sister 50    small cell lung cancer  . Cancer Maternal Aunt     ovarian cancer  . Cancer Paternal Uncle     brain cancer   . Cancer Other 40    ovarian cancer (maternal niece)    ALLERGIES:  is allergic to latex; cymbalta; and penicillins.  MEDICATIONS:  Current Outpatient Prescriptions  Medication Sig Dispense Refill  . acetaminophen (TYLENOL) 500 MG tablet Take 1,000 mg by mouth every 6 (six) hours as needed for fever.    . cyclobenzaprine (FLEXERIL) 10 MG tablet Take 1 tablet (10 mg total) by mouth 3 (three) times daily as needed for muscle spasms. 30 tablet 0  . diphenhydrAMINE (BENADRYL) 25 mg capsule Take 25 mg by mouth every 6 (six) hours as needed for sleep.    Marland Kitchen escitalopram (LEXAPRO) 10 MG tablet Take 10 mg by mouth daily.    . ferrous sulfate 325 (65 FE) MG tablet Take 325 mg by mouth 2 (two) times daily with a meal.    . oxyCODONE 10 MG TABS Take 1 tablet (10 mg total) by mouth every 4 (four) hours as needed for moderate pain. 30 tablet 0  . sodium chloride irrigation 0.9 % irrigation Using for wet to dry dsg. To abdomen    . traMADol (ULTRAM) 50 MG tablet Take 1-2 tablets (50-100 mg total) by mouth every 6 (six) hours as needed. 30 tablet 1  . vitamin B-12 (CYANOCOBALAMIN) 1000 MCG tablet Take  500 mcg by mouth daily.    . vitamin C (ASCORBIC ACID) 500 MG tablet Take 500 mg by mouth daily.     No current facility-administered medications for this visit.   Facility-Administered Medications Ordered in Other Visits  Medication Dose Route Frequency Provider Last Rate Last Dose  . clindamycin (CLEOCIN) 900 mg in dextrose 5 % 50 mL IVPB  900 mg Intravenous 60 min Pre-Op Rolm Bookbinder, MD       And  . gentamicin (GARAMYCIN) 310 mg in dextrose 5 % 50 mL IVPB  5 mg/kg Intravenous 60 min Pre-Op Rolm Bookbinder, MD        REVIEW OF SYSTEMS:   Constitutional: Denies fevers, chills or abnormal night sweats Eyes: Denies blurriness of vision, double vision or watery eyes  Ears, nose, mouth, throat, and face: Denies mucositis or sore throat Respiratory: Denies cough, dyspnea or wheezes Cardiovascular: Denies palpitation, chest discomfort or lower extremity swelling Gastrointestinal:  Denies nausea, heartburn or change in bowel habits Skin: Denies abnormal skin rashes Lymphatics: Denies new lymphadenopathy or easy bruising Neurological:Denies numbness, tingling or new weaknesses Behavioral/Psych: Mood is stable, no new changes  All other systems were reviewed with the patient and are negative.  PHYSICAL EXAMINATION: ECOG PERFORMANCE STATUS: 1 - Symptomatic but completely ambulatory  Filed Vitals:   09/04/15 1047  BP: 112/85  Pulse: 126  Temp: 98.4 F (36.9 C)  Resp: 18   Filed Weights   09/04/15 1047  Weight: 135 lb 12.8 oz (61.598 kg)    GENERAL:alert, no distress and comfortable SKIN: skin color, texture, turgor are normal, no rashes or significant lesions EYES: normal, conjunctiva are pink and non-injected, sclera clear OROPHARYNX:no exudate, no erythema and lips, buccal mucosa, and tongue normal  NECK: supple, thyroid normal size, non-tender, without nodularity LYMPH:  no palpable lymphadenopathy in the cervical, axillary or inguinal   LUNGS: clear to auscultation and  percussion with normal breathing effort HEART: regular rate & rhythm and no murmurs and no lower extremity edema ABDOMEN:abdomen soft, non-tender and normal bowel sounds,  Her surgical wound will covered by gauze, per the picture she showed me, the wound is 5 x 2", 2 inches deep, clear, with fresh pinkish granulation tissue. No discharge.  Musculoskeletal:no cyanosis of digits and no clubbing  PSYCH: alert & oriented x 3 with fluent speech NEURO: no focal motor/sensory deficits  LABORATORY DATA:  I have reviewed the data as listed CBC Latest Ref Rng 09/04/2015 08/20/2015 08/11/2015  WBC 3.9 - 10.3 10e3/uL 4.6 5.8 7.9  Hemoglobin 11.6 - 15.9 g/dL 9.9(L) 9.6(L) 8.7(L)  Hematocrit 34.8 - 46.6 % 32.1(L) 30.4(L) 27.0(L)  Platelets 145 - 400 10e3/uL 251 471(H) 351      Recent Labs  07/29/15 1428 08/04/15 0346 08/06/15 0427 08/10/15 0645 08/11/15 0013 08/20/15 1258 09/04/15 1013  NA 140 136 134*  --  134* 136 140  K 4.4 3.8 3.9  --  3.5 4.1 4.0  CL 106 103 104  --  96*  --   --   CO2 25 25 21*  --  _0 GLUCOSE 86 157* 122*  --  104* 95 89  BUN 7 5* <5*  --  7 10.6 5.9*  CREATININE 0.46 0.63 0.45 0.46 0.51 0.6 0.6  CALCIUM 9.1 8.6* 9.0  --  8.7* 9.6 9.3  GFRNONAA >60 >60 >60 >60 >60  --   --   GFRAA >60 >60 >60 >60 >60  --   --   PROT 6.8  --   --   --   --  7.4 7.3  ALBUMIN 3.5  --   --   --   --  3.1* 3.4*  AST 22  --   --   --   --  17 15  ALT 17  --   --   --   --  12 9  ALKPHOS 87  --   --   --   --  85 122  BILITOT 0.3  --   --   --   --  <0.30 <0.30   CEA  Status: Finalresult Visible to patient:  MyChart Nextappt: 09/18/2015 at 09:30 AM in Oncology [facility] 4) Dx:  Cancer of right colon ([place])  Ref Range 2wk ago    CEA 0.0 - 4.7 ng/mL 1.3          PATHOLOGY REPORT Diagnosis 08/03/2015 Colon, segmental resection, Right - INVASIVE ADENOCARCINOMA WITH ABUNDANT EXTRACELLULAR MUCIN, MODERATELY DIFFERENTIATED, LIKELY ARISING FROM  ILEOCECAL VALVE. - ADENOCARCINOMA EXTENDS AT LEAST INTO THE PERICOLONIC SOFT TISSUE AND IS ASSOCIATED WITH TRANSMURAL DEFECT. - THE PROXIMAL AND DISTAL RESECTION MARGINS ARE NEGATIVE FOR ADENOCARCINOMA. - ACELLULAR MUCIN IS GROSSLY PRESENT AT THE SEROSAL SURFACE. - LYMPHOVASCULAR INVASION IS IDENTIFIED. - METASTATIC CARCINOMA IN 1 OF 40 LYMPH NODES (1/40). - SEE ONCOLOGY TABLE BELOW. Microscopic Comment COLON AND RECTUM (INCLUDING TRANS-ANAL RESECTION): Specimen: Right colon and terminal ileum. Procedure: Resection. Tumor site: Likely ileocecal valve. Specimen integrity: Transmural defect(s). Macroscopic intactness of mesorectum: N/A Macroscopic tumor perforation: Present. Invasive tumor: Maximum size: At least 2.5 cm. Histologic type(s): Adenocarcinoma with abundant extracellular mucin. Histologic grade and differentiation: G2: moderately differentiated. Type of polyp in which invasive carcinoma arose: Tubular adenoma. Microscopic extension of invasive tumor: Adenocarcinoma extends into pericolonic soft tissue and acellular mucin is grossly present at the serosal surface. Lymph-Vascular invasion: Present. Peri-neural invasion: Not identified. Tumor deposit(s) (discontinuous extramural extension): Not identified. Resection margins: Proximal margin: Negative for carcinoma. Distal margin: Negative for carcinoma. Circumferential (radial) (posterior ascending, posterior descending; lateral and posterior mid-rectum; and entire lower 1/3 rectum): Acellular mucin is present at the circumferential tissue edge. Treatment effect (neo-adjuvant therapy): N/A Additional polyp(s): Not identified. Non-neoplastic findings: No significant findings. Lymph nodes: number examined 40; number positive: 1 Pathologic Staging: at least pT4a, pN1a. Ancillary studies: MMR by IHC and MSI by PCR will be performed and the results reported separately. Additional studies can be performed upon clinician  request.   ADDITIONAL INFORMATION: Mismatch Repair (MMR) Protein Immunohistochemistry (IHC) IHC Expression Result: MLH1: EQUIVOCAL MSH2: EQUIVOCAL MSH6: EQUIVOCAL PMS2: EQUIVOCAL * Internal control demonstrates intact nuclear expression Interpretation: EQUIVOCAL The tumor shows partial staining with all anibodies and hence the results are considered equivocal. Correlated with molecular based MSI testing is strongly recommended.    RADIOGRAPHIC STUDIES: I have personally reviewed the radiological images as listed and agreed with the findings in the report. Ct Abdomen Pelvis Wo Contrast  08/10/2015  CLINICAL DATA:  50 year old female with operative wound drainage and fever. Patient is 7 days postop status post exploratory laparotomy and right hemicolectomy for persistent right lower quadrant abscess after perforated appendicitis complicated by cecal perforation and despite attempted percutaneous drainage. EXAM: CT ABDOMEN AND PELVIS WITHOUT CONTRAST TECHNIQUE: Multidetector CT imaging of the abdomen and pelvis was performed following the standard protocol without IV contrast. COMPARISON:  Most recent prior CT abdomen/ pelvis 07/02/2015 and 04/01/2015 FINDINGS: Lower Chest: Stable 5 mm nodule in the right lower lobe. Trace dependent atelectasis in the right lower lobe. Otherwise, the visualized lower lungs are clear. Visualized cardiac mediastinal contours within normal limits. No pericardial effusion. Calcifications along the mitral valve annulus. Abdomen: Unenhanced is surgical changes of prior gastric bypass procedure. No evidence of upper abdominal abscess or bowel obstruction. Unremarkable CT appearance of the spleen, adrenal glands and pancreas. Normal hepatic contour and morphology. Gallbladder is distended but does not appear inflamed. Unremarkable appearance of the bilateral kidneys. No focal solid lesion, hydronephrosis or nephrolithiasis. Surgical changes of right hemicolectomy. The  ileocolonic anastomosis in the right upper quadrant in the region of the hepatic flexure. This is widely patent. No evidence of bowel obstruction or intra-abdominal abscess. JP drainage catheter remains present within the right lower quadrant. Pelvis: Surgical changes of prior  hysterectomy. The bladder is decompressed. Bones/Soft Tissues: Open midline abdominal wound containing packing gauze. There is associated inflammatory stranding. Small locules of air track into the rectus abdominis musculature but did not extend into the peritoneum. Small fat containing umbilical hernia. No acute fracture or aggressive appearing lytic or blastic osseous lesion. Vascular: Limited evaluation in the absence of IV contrast. No aneurysm. IMPRESSION: 1. CT findings consistent with clinical history of midline incision wound infection. Locules of air track deep into the rectus abdominis musculature but do not appear to cross into the peritoneum. No evidence of intra-abdominal abscess or anastomotic leak. 2. Surgical changes prior gastric bypass surgery and right hemicolectomy with ileocolonic anastomosis. All anastomoses appear widely patent and intact. 3. Stable 5 mm nodule in the right lower lobe compared to 04/01/2015. 4. JP drain in the right lower quadrant. Electronically Signed   By: Jacqulynn Cadet M.D.   On: 08/10/2015 17:12   Ct Chest Wo Contrast  09/03/2015  CLINICAL DATA:  Colon cancer staging.  Initial evaluation EXAM: CT CHEST WITHOUT CONTRAST TECHNIQUE: Multidetector CT imaging of the chest was performed following the standard protocol without IV contrast. COMPARISON:  CT 07/02/2015 common 04/01/2015 FINDINGS: Mediastinum/Nodes: No axillary or supraclavicular adenopathy. No mediastinal hilar adenopathy. No pericardial fluid. Esophagus normal. Lungs/Pleura: 6 mm nodule in the RIGHT lower lobe (image 38, series 5). This region was not imaged on comparison CT abdomen. 4 mm nodule in the LEFT lower lobe (image 41, series  5) is not changed. Upper abdomen: Limited view of the liver, kidneys, pancreas are unremarkable. Normal adrenal glands. Post bariatric surgery. Musculoskeletal: No aggressive osseous lesion. IMPRESSION: 1. A 6 mm RIGHT lower lobe nodule not previously imaged. Recommend attention on follow-up. 2. Stable small LEFT lower lobe pulmonary nodule compared to prior CT. 3. No mediastinal lymphadenopathy. Electronically Signed   By: Suzy Bouchard M.D.   On: 09/03/2015 17:34    ASSESSMENT & PLAN:  50 year old Caucasian female, with past medical history of fibromyalgia, depression, presented with ruptured cecum and pericolonic abscess, required multiple drainage, antibiotics, and eventually right colectomy.  1. Right colon cancer, cecum, moderately differentiated adenocarcinoma, pT4N1aM0, stage IIIA, with perforation, MSI-stable  - I reviewed her scan findings, and surgical pathology results in great details with patient and her boyfriend. -Her CT chest was negative for metastasis. - she has locally advanced stage III  Disease, with particular high risk features of T4 tumor, proliferation for 3-4 months,  And positive lymph nodes, she is at very high risk for cancer recurrence, especially peritoneal carcinomatosis. - I strongly recommend her to consider adjuvant chemotherapy, FOLFOX or CAPEOX. She opted CAPEOX,with oxaliplatin 130 mg/m, on day one, andcapecitabien 1000 mg/m (dose can be reduced to 812m/m2 if tolerance issue) twice daily, on day 1-14, every 21 day cycle, a total of 8 cycles  --Chemotherapy consent: Side effects including but does not not limited to, fatigue, nausea, vomiting, diarrhea, hair loss, neuropathy, fluid retention, renal and kidney dysfunction, neutropenic fever, needed for blood transfusion, bleeding, coronary artery spasm and heart attack, delay of wound healing, were discussed with patient in great detail. She agrees to proceed. -her abdominal wound may take a few months to heal  completely, I'll check with her surgeon Dr. WDonne Hazelto see if he agrees to start chemotherapy in 2 weeks.  2. Abdominal wound, history of abdominal abscess -The wound is healing well, she will continue dressing change and a follow-up with Dr. WDonne [name] 3. Genetics - given her young age and multiple  family history of colon, breast and ovary cancer, I'll refer her to genetics to ruled out inheritable  Cancer syndrome  4. Anemia - likely secondary to surgery,  Chronic infection and GI bleeding from the tumor -Iron study from today is still pending - I recommend her to take iron pill over-the-counter 1-2 tablets a day  5. Fibromyalgia and depression - she will continue medication, and follow-up with her primary care physician  Plan -I will send prescription capecitabine 1529m bid, #84 with 3 refills to [facility] -Return to clinic in 2 weeks to start her first cycle CAPEOX   All questions were answered. The patient knows to call the clinic with any problems, questions or concerns. I spent 25 minutes counseling the patient face to face. The total time spent in the appointment was 30 minutes and more than 50% was on counseling.     FTruitt Merle MD 09/04/2015

## 2015-09-04 NOTE — Telephone Encounter (Signed)
Appointments made and avs printed °

## 2015-09-07 ENCOUNTER — Telehealth: Payer: Self-pay | Admitting: Pharmacist

## 2015-09-07 MED ORDER — CAPECITABINE 500 MG PO TABS: 1000.0000 mg/m2 | ORAL_TABLET | Freq: Two times a day (BID) | ORAL | Status: DC

## 2015-09-07 NOTE — Addendum Note (Signed)
Addended by: Truitt Merle on: 09/07/2015 04:46 PM   Modules accepted: Orders

## 2015-09-07 NOTE — Telephone Encounter (Signed)
09/07/15: New Rx for Capecitabine sent to [facility]

## 2015-09-08 ENCOUNTER — Telehealth: Payer: Self-pay | Admitting: Pharmacist

## 2015-09-08 NOTE — Telephone Encounter (Signed)
09/08/15: Rx for Capecitabine requires specialty pharmacy. Faxed to [facility] specialty pharmacy at [phone]

## 2015-09-08 NOTE — Telephone Encounter (Signed)
09/08/15: [facility] unable to process Xeloda order. Per insurance [facility] specialty pharmacy needs to be used. Rx for Xeloda sent to [place]

## 2015-09-11 ENCOUNTER — Telehealth: Payer: Self-pay | Admitting: *Deleted

## 2015-09-11 NOTE — Telephone Encounter (Signed)
  Oncology Nurse Navigator Documentation  Navigator Location: [facility] (09/11/15 1648) Navigator Encounter Type: Telephone (09/11/15 1648) Telephone: Outgoing Call;Patient Update (09/11/15 1648)  Left VM requesting return call on Monday in regards to if she has heard from [facility] yet regarding her Xeloda.

## 2015-09-14 ENCOUNTER — Other Ambulatory Visit: Payer: Self-pay | Admitting: *Deleted

## 2015-09-14 ENCOUNTER — Telehealth: Payer: Self-pay | Admitting: *Deleted

## 2015-09-14 MED ORDER — DEXAMETHASONE 4 MG PO TABS: 8.0000 mg | ORAL_TABLET | Freq: Every day | ORAL | Status: DC

## 2015-09-14 MED ORDER — PROCHLORPERAZINE MALEATE 10 MG PO TABS: 10.0000 mg | ORAL_TABLET | Freq: Four times a day (QID) | ORAL | Status: DC | PRN

## 2015-09-14 MED ORDER — ONDANSETRON HCL 8 MG PO TABS: 8.0000 mg | ORAL_TABLET | ORAL | Status: DC

## 2015-09-14 NOTE — Telephone Encounter (Signed)
Oncology Nurse Navigator Documentation  Oncology Nurse Navigator Flowsheets 09/14/2015  Navigator Location [facility]  Navigator Encounter Type Telephone  Telephone Incoming Call;Education  Abnormal Finding Date -  Confirmed Diagnosis Date -  Surgery Date -  Treatment Phase -  Barriers/Navigation Needs -Reviewed directions on taking Xeloda-start after breakfast on 09/18/15. She will have total of 8 cycles of this regimen. Reports she had zero co pay on Xeloda. It will be delivered on Tuesday.  Interventions -  Coordination of Care -  Acuity -  Time Spent with Patient 10

## 2015-09-16 ENCOUNTER — Telehealth: Payer: Self-pay | Admitting: Pharmacist

## 2015-09-16 NOTE — Telephone Encounter (Signed)
09/16/15: Attempted to call patient for initial counseling on oral medication: Xeloda. Unable to reach patient. Left message for patient to call back to discuss. Plans to start on 09/18/15 with oxaliplatin. Xeloda was scheduled to be delivered via [facility] on Tuesday 09/15/15  Thank you,  Montel Clock, PharmD, [facility]

## 2015-09-18 ENCOUNTER — Encounter: Payer: Self-pay | Admitting: Pharmacist

## 2015-09-18 ENCOUNTER — Other Ambulatory Visit (HOSPITAL_BASED_OUTPATIENT_CLINIC_OR_DEPARTMENT_OTHER): Payer: Managed Care, Other (non HMO)

## 2015-09-18 ENCOUNTER — Encounter: Payer: Self-pay | Admitting: *Deleted

## 2015-09-18 ENCOUNTER — Ambulatory Visit (HOSPITAL_BASED_OUTPATIENT_CLINIC_OR_DEPARTMENT_OTHER): Payer: Managed Care, Other (non HMO) | Admitting: Hematology

## 2015-09-18 ENCOUNTER — Ambulatory Visit: Payer: Managed Care, Other (non HMO)

## 2015-09-18 ENCOUNTER — Encounter: Payer: Self-pay | Admitting: Hematology

## 2015-09-18 VITALS — BP 112/71 | HR 102 | Temp 98.2°F | Resp 18 | Wt 137.4 lb

## 2015-09-18 DIAGNOSIS — M797 Fibromyalgia: Secondary | ICD-10-CM | POA: Diagnosis not present

## 2015-09-18 DIAGNOSIS — Z5111 Encounter for antineoplastic chemotherapy: Secondary | ICD-10-CM

## 2015-09-18 DIAGNOSIS — D649 Anemia, unspecified: Secondary | ICD-10-CM

## 2015-09-18 DIAGNOSIS — E538 Deficiency of other specified B group vitamins: Secondary | ICD-10-CM

## 2015-09-18 DIAGNOSIS — F329 Major depressive disorder, single episode, unspecified: Secondary | ICD-10-CM | POA: Diagnosis not present

## 2015-09-18 DIAGNOSIS — C182 Malignant neoplasm of ascending colon: Secondary | ICD-10-CM

## 2015-09-18 DIAGNOSIS — C180 Malignant neoplasm of cecum: Secondary | ICD-10-CM | POA: Diagnosis not present

## 2015-09-18 DIAGNOSIS — D509 Iron deficiency anemia, unspecified: Secondary | ICD-10-CM

## 2015-09-18 LAB — CBC WITH DIFFERENTIAL/PLATELET
BASO%: 0.7 % (ref 0.0–2.0)
Basophils Absolute: 0 10*3/uL (ref 0.0–0.1)
EOS ABS: 0.1 10*3/uL (ref 0.0–0.5)
EOS%: 2.7 % (ref 0.0–7.0)
HEMATOCRIT: 29.5 % — AB (ref 34.8–46.6)
HEMOGLOBIN: 9 g/dL — AB (ref 11.6–15.9)
LYMPH#: 1.5 10*3/uL (ref 0.9–3.3)
LYMPH%: 28.3 % (ref 14.0–49.7)
MCH: 23 pg — ABNORMAL LOW (ref 25.1–34.0)
MCHC: 30.4 g/dL — ABNORMAL LOW (ref 31.5–36.0)
MCV: 75.5 fL — ABNORMAL LOW (ref 79.5–101.0)
MONO#: 0.5 10*3/uL (ref 0.1–0.9)
MONO%: 8.4 % (ref 0.0–14.0)
NEUT%: 59.9 % (ref 38.4–76.8)
NEUTROS ABS: 3.2 10*3/uL (ref 1.5–6.5)
PLATELETS: 254 10*3/uL (ref 145–400)
RBC: 3.9 10*6/uL (ref 3.70–5.45)
RDW: 17.6 % — ABNORMAL HIGH (ref 11.2–14.5)
WBC: 5.4 10*3/uL (ref 3.9–10.3)

## 2015-09-18 LAB — COMPREHENSIVE METABOLIC PANEL
ALBUMIN: 3.4 g/dL — AB (ref 3.5–5.0)
ALK PHOS: 105 U/L (ref 40–150)
ALT: <9 U/L (ref 0–55)
AST: 12 U/L (ref 5–34)
Anion Gap: 8 mEq/L (ref 3–11)
BUN: 17.1 mg/dL (ref 7.0–26.0)
CALCIUM: 9.4 mg/dL (ref 8.4–10.4)
CHLORIDE: 107 meq/L (ref 98–109)
CO2: 23 mEq/L (ref 22–29)
Creatinine: 0.7 mg/dL (ref 0.6–1.1)
GLUCOSE: 82 mg/dL (ref 70–140)
POTASSIUM: 4.5 meq/L (ref 3.5–5.1)
SODIUM: 138 meq/L (ref 136–145)
Total Bilirubin: <0.3 mg/dL (ref 0.20–1.20)
Total Protein: 7.1 g/dL (ref 6.4–8.3)

## 2015-09-18 LAB — IRON AND TIBC
%SAT: 5 % — AB (ref 21–57)
Iron: 18 ug/dL — ABNORMAL LOW (ref 41–142)
TIBC: 385 ug/dL (ref 236–444)
UIBC: 367 ug/dL (ref 120–384)

## 2015-09-18 LAB — FERRITIN: Ferritin: 12 ng/mL (ref 9–269)

## 2015-09-18 MED ORDER — PALONOSETRON HCL INJECTION 0.25 MG/5ML
INTRAVENOUS | Status: AC
  Filled 2015-09-18: qty 5

## 2015-09-18 MED ORDER — DEXTROSE 5 % IV SOLN
Freq: Once | INTRAVENOUS | Status: AC
  Administered 2015-09-18: 11:00:00 via INTRAVENOUS

## 2015-09-18 MED ORDER — SODIUM CHLORIDE 0.9 % IV SOLN
10.0000 mg | Freq: Once | INTRAVENOUS | Status: AC
  Administered 2015-09-18: 10 mg via INTRAVENOUS
  Filled 2015-09-18: qty 1

## 2015-09-18 MED ORDER — PALONOSETRON HCL INJECTION 0.25 MG/5ML
0.2500 mg | Freq: Once | INTRAVENOUS | Status: AC
  Administered 2015-09-18: 0.25 mg via INTRAVENOUS

## 2015-09-18 MED ORDER — OXALIPLATIN CHEMO INJECTION 100 MG/20ML
122.0000 mg/m2 | Freq: Once | INTRAVENOUS | Status: AC
  Administered 2015-09-18: 200 mg via INTRAVENOUS
  Filled 2015-09-18: qty 40

## 2015-09-18 NOTE — Progress Notes (Signed)
[place]  [phone] [phone]  Clinic follow up Note   Patient Care Team: Carol Ada, MD as PCP - General (Family Medicine) Rolm Bookbinder, MD as Consulting Physician (General Surgery) 09/18/2015   CHIEF COMPLAINTS:  Follow up stage III colon cancer   Oncology History   Cancer of right colon [facility])   Staging form: Colon and Rectum, AJCC 7th Edition     Pathologic stage from 08/03/2015: Stage IIIB (T4a, N1a, cM0) - Signed by Truitt Merle, MD on 09/04/2015       Cancer of right colon ([place])   03/31/2015 Imaging CT abdomen showed appendicitis, perforated, with periappendiceal abscess. And small pulmonary nodules.   04/03/2015 Procedure CT guided placement of going Through into the right lower abdominal quadrant with aspiration of a total of 30 mL prudent fluid.    08/03/2015 Initial Diagnosis Cancer of right colon ([place])   08/03/2015 Surgery Right hemicolectomy   08/03/2015 Pathology Results Right hemicolectomy showed invasive adenocarcinoma, moderately differentiated, likely arising from a cecal valve. One out of 40 lymph nodes were positive, (+) LVI, margins negative    08/03/2015 Miscellaneous colon cance MSI-stable     HISTORY OF PRESENTING ILLNESS:  [name] 50 y.o. female without significant PMH is here because of her recently diagnosed stage III colon cancer. She is accompanied by her boyfriend to the clinic today.  She developed fever and headache in September 2016, was found to have ruptured appendix and was admitted to hospital. CT scan revealed a peri-appendiceal abscess. She underwent abscess drainage tube basement by interventional radiology on 04/17/2015, and a course of antibiotics. She had a multiple drainage tube exchange by IR, but the abscess did not heal. She was referred to Dr. Donne [name]. Multiple CT scan in October and November showed persistent peri-appendiceal fluid collection. She was finally taken to the operation room on  08/03/2015. Intraoperatively, she was found to have a 2 cm whole present in the cecum, she underwent ileocectomy with an anastomosis in the hepatic fixture. 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  She had wound infection issue after surgery. It has been healing slowly, she is still on wet-to-dry dressing change twice daily, has home care nurse. She has moderate pain, 6-7/10, sometime postional, she is on oxycodone 2-3 times a day. Her appetite and eating is getting better, lost about 25lb since 03/2015, no fever and chills for the past 3-4 days, just finished abx yesterday.   CURRENT THERAPY: adjuvant chemotherapy CAPEOX, capecitabine 1524m bid on Day1-14, oxaliplatin 1342mm2 on D1, every 21 days, started on 09/18/2015, plan for 8 cycles   INTERIM HISTORY:  [name] for follow-up and first cycle chemo. She is doing well overall. Her abdominal wall wound continues healing, is quite small now. She changes the dressing once a day. No significant discharge or skin erythema. She has good appetite and eating well, no other complaints.  MEDICAL HISTORY:  Past Medical History  Diagnosis Date  . Anemia   . Depression   . Anxiety   . JP drain bleeding   . GERD (gastroesophageal reflux disease)   . Fibromyalgia     SURGICAL HISTORY: Past Surgical History  Procedure Laterality Date  . Abdominal hysterectomy    . Gastric bypass    . Colon surgery  08/03/2015  . Laparoscopic ileocecectomy Right 08/03/2015    Procedure: LAPAROSCOPIC DIAGNOSTIC RIGHT COLECTOMY;  Surgeon: MaRolm BookbinderMD;  [place] [facility] Service: General;  Laterality: Right;  . Colostomy  revision N/A 08/03/2015    Procedure: COLON RESECTION RIGHT;  Surgeon: Rolm Bookbinder, MD;  Location: [place];  Service: General;  Laterality: N/A;    SOCIAL HISTORY: Social History   Social History  . Marital Status: Married    Spouse Name: N/A  . Number of Children: N/A  . Years of Education: N/A    Occupational History  . Not on file.   Social History Main Topics  . Smoking status: Never Smoker   . Smokeless tobacco: Never Used  . Alcohol Use: 1.2 oz/week    2 Glasses of wine per week     Comment: moderate 1-2 times a month   . Drug Use: No  . Sexual Activity: Not on file   Other Topics Concern  . Not on file   Social History Narrative   Single, fiance [name]   Works [employer] 11:30am to 8 pm    FAMILY HISTORY: Family History  Problem Relation Age of Onset  . Cancer Father 5    small cell lung cancer and colon cancer   . Cancer Sister 50    small cell lung cancer  . Cancer Maternal Aunt     ovarian cancer  . Cancer Paternal Uncle     brain cancer   . Cancer Other 40    ovarian cancer (maternal niece)    ALLERGIES:  is allergic to latex; cymbalta; and penicillins.  MEDICATIONS:  Current Outpatient Prescriptions  Medication Sig Dispense Refill  . acetaminophen (TYLENOL) 500 MG tablet Take 1,000 mg by mouth every 6 (six) hours as needed for fever.    . capecitabine (XELODA) 500 MG tablet Take 3 tablets (1,500 mg total) by mouth 2 (two) times daily after a meal. Take on days 1-14 of chemotherapy. 84 tablet 1  . cyclobenzaprine (FLEXERIL) 10 MG tablet Take 1 tablet (10 mg total) by mouth 3 (three) times daily as needed for muscle spasms. 30 tablet 0  . dexamethasone (DECADRON) 4 MG tablet Take 2 tablets (8 mg total) by mouth daily. Start day after chemo and take for 3 days after each IV chemo 30 tablet 1  . diphenhydrAMINE (BENADRYL) 25 mg capsule Take 25 mg by mouth every 6 (six) hours as needed for sleep.    Marland Kitchen escitalopram (LEXAPRO) 10 MG tablet Take 10 mg by mouth daily.    . ferrous sulfate 325 (65 FE) MG tablet Take 325 mg by mouth 2 (two) times daily with a meal.    . ondansetron (ZOFRAN) 8 MG tablet Take 1 tablet (8 mg total) by mouth as directed. Take twice daily as needed for nausea-start on day 3 of chemotherapy 30 tablet 1  . oxyCODONE  10 MG TABS Take 1 tablet (10 mg total) by mouth every 4 (four) hours as needed for moderate pain. 30 tablet 0  . prochlorperazine (COMPAZINE) 10 MG tablet Take 1 tablet (10 mg total) by mouth every 6 (six) hours as needed for nausea or vomiting. 30 tablet 1  . sodium chloride irrigation 0.9 % irrigation Using for wet to dry dsg. To abdomen    . traMADol (ULTRAM) 50 MG tablet Take 1-2 tablets (50-100 mg total) by mouth every 6 (six) hours as needed. 30 tablet 1  . vitamin B-12 (CYANOCOBALAMIN) 1000 MCG tablet Take 500 mcg by mouth daily.    . vitamin C (ASCORBIC ACID) 500 MG tablet Take 500 mg by mouth daily.     No current facility-administered medications for this visit.  Facility-Administered Medications Ordered in Other Visits  Medication Dose Route Frequency Provider Last Rate Last Dose  . clindamycin (CLEOCIN) 900 mg in dextrose 5 % 50 mL IVPB  900 mg Intravenous 60 min Pre-Op Rolm Bookbinder, MD       And  . gentamicin (GARAMYCIN) 310 mg in dextrose 5 % 50 mL IVPB  5 mg/kg Intravenous 60 min Pre-Op Rolm Bookbinder, MD        REVIEW OF SYSTEMS:   Constitutional: Denies fevers, chills or abnormal night sweats Eyes: Denies blurriness of vision, double vision or watery eyes Ears, nose, mouth, throat, and face: Denies mucositis or sore throat Respiratory: Denies cough, dyspnea or wheezes Cardiovascular: Denies palpitation, chest discomfort or lower extremity swelling Gastrointestinal:  Denies nausea, heartburn or change in bowel habits Skin: Denies abnormal skin rashes Lymphatics: Denies new lymphadenopathy or easy bruising Neurological:Denies numbness, tingling or new weaknesses Behavioral/Psych: Mood is stable, no new changes  All other systems were reviewed with the patient and are negative.  PHYSICAL EXAMINATION: ECOG PERFORMANCE STATUS: 1 - Symptomatic but completely ambulatory  Filed Vitals:   09/18/15 0952  BP: 112/71  Pulse: 102  Temp: 98.2 F (36.8 C)  Resp: 18    Filed Weights   09/18/15 0952  Weight: 137 lb 6.4 oz (62.324 kg)    GENERAL:alert, no distress and comfortable SKIN: skin color, texture, turgor are normal, no rashes or significant lesions EYES: normal, conjunctiva are pink and non-injected, sclera clear OROPHARYNX:no exudate, no erythema and lips, buccal mucosa, and tongue normal  NECK: supple, thyroid normal size, non-tender, without nodularity LYMPH:  no palpable lymphadenopathy in the cervical, axillary or inguinal   LUNGS: clear to auscultation and percussion with normal breathing effort HEART: regular rate & rhythm and no murmurs and no lower extremity edema ABDOMEN:abdomen soft, non-tender and normal bowel sounds,  Her surgical wound will covered by gauze.  Musculoskeletal:no cyanosis of digits and no clubbing  PSYCH: alert & oriented x 3 with fluent speech NEURO: no focal motor/sensory deficits  LABORATORY DATA:  I have reviewed the data as listed CBC Latest Ref Rng 09/04/2015 08/20/2015 08/11/2015  WBC 3.9 - 10.3 10e3/uL 4.6 5.8 7.9  Hemoglobin 11.6 - 15.9 g/dL 9.9(L) 9.6(L) 8.7(L)  Hematocrit 34.8 - 46.6 % 32.1(L) 30.4(L) 27.0(L)  Platelets 145 - 400 10e3/uL 251 471(H) 351      Recent Labs  07/29/15 1428 08/04/15 0346 08/06/15 0427 08/10/15 0645 08/11/15 0013 08/20/15 1258 09/04/15 1013  NA 140 136 134*  --  134* 136 140  K 4.4 3.8 3.9  --  3.5 4.1 4.0  CL 106 103 104  --  96*  --   --   CO2 25 25 21*  --  _0 GLUCOSE 86 157* 122*  --  104* 95 89  BUN 7 5* <5*  --  7 10.6 5.9*  CREATININE 0.46 0.63 0.45 0.46 0.51 0.6 0.6  CALCIUM 9.1 8.6* 9.0  --  8.7* 9.6 9.3  GFRNONAA >60 >60 >60 >60 >60  --   --   GFRAA >60 >60 >60 >60 >60  --   --   PROT 6.8  --   --   --   --  7.4 7.3  ALBUMIN 3.5  --   --   --   --  3.1* 3.4*  AST 22  --   --   --   --  17 15  ALT 17  --   --   --   --  12 9  ALKPHOS 87  --   --   --   --  85 122  BILITOT 0.3  --   --   --   --  <0.30 <0.30   CEA  Status: Finalresult  Visible to patient:  MyChart Nextappt: 09/18/2015 at 09:30 AM in [facility] 4) Dx:  Cancer of right colon ([place])         Ref Range 2wk ago    CEA 0.0 - 4.7 ng/mL 1.3        Results for KRISLYN, DONNAN (MRN [id number]) as of 09/19/2015 22:11  Ref. Range 09/18/2015 09:35  Iron Latest Ref Range: 41-142 ug/dL 18 (L)  UIBC Latest Ref Range: 120-384 ug/dL 367  TIBC Latest Ref Range: 236-444 ug/dL 385  %SAT Latest Ref Range: 21-57 % 5 (L)  Ferritin Latest Ref Range: 9-269 ng/ml 12    PATHOLOGY REPORT Diagnosis 08/03/2015 Colon, segmental resection, Right - INVASIVE ADENOCARCINOMA WITH ABUNDANT EXTRACELLULAR MUCIN, MODERATELY DIFFERENTIATED, LIKELY ARISING FROM ILEOCECAL VALVE. - ADENOCARCINOMA EXTENDS AT LEAST INTO THE PERICOLONIC SOFT TISSUE AND IS ASSOCIATED WITH TRANSMURAL DEFECT. - THE PROXIMAL AND DISTAL RESECTION MARGINS ARE NEGATIVE FOR ADENOCARCINOMA. - ACELLULAR MUCIN IS GROSSLY PRESENT AT THE SEROSAL SURFACE. - LYMPHOVASCULAR INVASION IS IDENTIFIED. - METASTATIC CARCINOMA IN 1 OF 40 LYMPH NODES (1/40). - SEE ONCOLOGY TABLE BELOW. Microscopic Comment COLON AND RECTUM (INCLUDING TRANS-ANAL RESECTION): Specimen: Right colon and terminal ileum. Procedure: Resection. Tumor site: Likely ileocecal valve. Specimen integrity: Transmural defect(s). Macroscopic intactness of mesorectum: N/A Macroscopic tumor perforation: Present. Invasive tumor: Maximum size: At least 2.5 cm. Histologic type(s): Adenocarcinoma with abundant extracellular mucin. Histologic grade and differentiation: G2: moderately differentiated. Type of polyp in which invasive carcinoma arose: Tubular adenoma. Microscopic extension of invasive tumor: Adenocarcinoma extends into pericolonic soft tissue and acellular mucin is grossly present at the serosal surface. Lymph-Vascular invasion: Present. Peri-neural invasion: Not identified. Tumor deposit(s) (discontinuous extramural extension):  Not identified. Resection margins: Proximal margin: Negative for carcinoma. Distal margin: Negative for carcinoma. Circumferential (radial) (posterior ascending, posterior descending; lateral and posterior mid-rectum; and entire lower 1/3 rectum): Acellular mucin is present at the circumferential tissue edge. Treatment effect (neo-adjuvant therapy): N/A Additional polyp(s): Not identified. Non-neoplastic findings: No significant findings. Lymph nodes: number examined 40; number positive: 1 Pathologic Staging: at least pT4a, pN1a. Ancillary studies: MMR by IHC and MSI by PCR will be performed and the results reported separately. Additional studies can be performed upon clinician request.   ADDITIONAL INFORMATION: Mismatch Repair (MMR) Protein Immunohistochemistry (IHC) IHC Expression Result: MLH1: EQUIVOCAL MSH2: EQUIVOCAL MSH6: EQUIVOCAL PMS2: EQUIVOCAL * Internal control demonstrates intact nuclear expression Interpretation: EQUIVOCAL The tumor shows partial staining with all anibodies and hence the results are considered equivocal. Correlated with molecular based MSI testing is strongly recommended.    RADIOGRAPHIC STUDIES: I have personally reviewed the radiological images as listed and agreed with the findings in the report. Ct Chest Wo Contrast  09/03/2015  CLINICAL DATA:  Colon cancer staging.  Initial evaluation EXAM: CT CHEST WITHOUT CONTRAST TECHNIQUE: Multidetector CT imaging of the chest was performed following the standard protocol without IV contrast. COMPARISON:  CT 07/02/2015 common 04/01/2015 FINDINGS: Mediastinum/Nodes: No axillary or supraclavicular adenopathy. No mediastinal hilar adenopathy. No pericardial fluid. Esophagus normal. Lungs/Pleura: 6 mm nodule in the RIGHT lower lobe (image 38, series 5). This region was not imaged on comparison CT abdomen. 4 mm nodule in the LEFT lower lobe (image 41, series 5) is not changed. Upper abdomen: Limited view of  the liver,  kidneys, pancreas are unremarkable. Normal adrenal glands. Post bariatric surgery. Musculoskeletal: No aggressive osseous lesion. IMPRESSION: 1. A 6 mm RIGHT lower lobe nodule not previously imaged. Recommend attention on follow-up. 2. Stable small LEFT lower lobe pulmonary nodule compared to prior CT. 3. No mediastinal lymphadenopathy. Electronically Signed   By: Suzy Bouchard M.D.   On: 09/03/2015 17:34    ASSESSMENT & PLAN:  50 year old Caucasian female, with past medical history of fibromyalgia, depression, presented with ruptured cecum and pericolonic abscess, required multiple drainage, antibiotics, and eventually right colectomy.  1. Right colon cancer, cecum, moderately differentiated adenocarcinoma, pT4N1aM0, stage IIIA, with perforation, MSI-stable  - I reviewed her scan findings, and surgical pathology results in great details with patient and her boyfriend. -Her CT chest was negative for metastasis. - she has locally advanced stage III  Disease, with particular high risk features of T4 tumor, proliferation for 3-4 months,  And positive lymph nodes, she is at very high risk for cancer recurrence, especially peritoneal carcinomatosis. - I strongly recommend her to consider adjuvant chemotherapy, FOLFOX or CAPEOX. She opted CAPEOX,with oxaliplatin 130 mg/m, on day one, andcapecitabien 1000 mg/m (dose can be reduced to 847m/m2 if tolerance issue) twice daily, on day 1-14, every 21 day cycle, a total of 8 cycles  -Lab results reviewed with patient, adequate for treatment, we'll proceed with cycle one today  2. Abdominal wound, history of abdominal abscess -The wound is healing well, she will continue dressing change and a follow-up with Dr. WDonne [name] 3. Genetics - given her young age and multiple family history of colon, breast and ovary cancer, I'll refer her to genetics to ruled out inheritable  Cancer syndrome  4. Anemia - likely secondary to surgery,  Chronic infection and GI  bleeding from the tumor -Iron study today showed low serum iron, transferrin saturation and ferritin 12, which is consistent with iron deficiency -Given her ongoing chemotherapy, I recommend her to have IV Feraheme, to improve her anemia quickly. -continue iron pill over-the-counter 1-2 tablets a day  5. Fibromyalgia and depression - she will continue medication, and follow-up with her primary care physician  Plan -first cycle CAPOX today -she will return for toxicity check up and iv feraheme on 3/7  All questions were answered. The patient knows to call the clinic with any problems, questions or concerns. I spent 25 minutes counseling the patient face to face. The total time spent in the appointment was 30 minutes and more than 50% was on counseling.     FTruitt Merle MD 09/18/2015

## 2015-09-18 NOTE — Patient Instructions (Signed)
[place] Discharge Instructions for Patients Receiving Chemotherapy  Today you received the following chemotherapy agents oxaliplatin  To help prevent nausea and vomiting after your treatment, we encourage you to take your nausea medication as directedOxaliplatin Injection What is this medicine? OXALIPLATIN (ox AL i PLA tin) is a chemotherapy drug. It targets fast dividing cells, like cancer cells, and causes these cells to die. This medicine is used to treat cancers of the colon and rectum, and many other cancers. This medicine may be used for other purposes; ask your health care provider or pharmacist if you have questions. What should I tell my health care provider before I take this medicine? They need to know if you have any of these conditions: -kidney disease -an unusual or allergic reaction to oxaliplatin, other chemotherapy, other medicines, foods, dyes, or preservatives -pregnant or trying to get pregnant -breast-feeding How should I use this medicine? This drug is given as an infusion into a vein. It is administered in a hospital or clinic by a specially trained health care professional. Talk to your pediatrician regarding the use of this medicine in children. Special care may be needed. Overdosage: If you think you have taken too much of this medicine contact a poison control center or emergency room at once. NOTE: This medicine is only for you. Do not share this medicine with others. What if I miss a dose? It is important not to miss a dose. Call your doctor or health care professional if you are unable to keep an appointment. What may interact with this medicine? -medicines to increase blood counts like filgrastim, pegfilgrastim, sargramostim -probenecid -some antibiotics like amikacin, gentamicin, neomycin, polymyxin B, streptomycin, tobramycin -zalcitabine Talk to your doctor or health care professional before taking any of these  medicines: -acetaminophen -aspirin -ibuprofen -ketoprofen -naproxen This list may not describe all possible interactions. Give your health care provider a list of all the medicines, herbs, non-prescription drugs, or dietary supplements you use. Also tell them if you smoke, drink alcohol, or use illegal drugs. Some items may interact with your medicine. What should I watch for while using this medicine? Your condition will be monitored carefully while you are receiving this medicine. You will need important blood work done while you are taking this medicine. This medicine can make you more sensitive to cold. Do not drink cold drinks or use ice. Cover exposed skin before coming in contact with cold temperatures or cold objects. When out in cold weather wear warm clothing and cover your mouth and nose to warm the air that goes into your lungs. Tell your doctor if you get sensitive to the cold. This drug may make you feel generally unwell. This is not uncommon, as chemotherapy can affect healthy cells as well as cancer cells. Report any side effects. Continue your course of treatment even though you feel ill unless your doctor tells you to stop. In some cases, you may be given additional medicines to help with side effects. Follow all directions for their use. Call your doctor or health care professional for advice if you get a fever, chills or sore throat, or other symptoms of a cold or flu. Do not treat yourself. This drug decreases your body's ability to fight infections. Try to avoid being around people who are sick. This medicine may increase your risk to bruise or bleed. Call your doctor or health care professional if you notice any unusual bleeding. Be careful brushing and flossing your teeth or using a toothpick  because you may get an infection or bleed more easily. If you have any dental work done, tell your dentist you are receiving this medicine. Avoid taking products that contain aspirin,  acetaminophen, ibuprofen, naproxen, or ketoprofen unless instructed by your doctor. These medicines may hide a fever. Do not become pregnant while taking this medicine. Women should inform their doctor if they wish to become pregnant or think they might be pregnant. There is a potential for serious side effects to an unborn child. Talk to your health care professional or pharmacist for more information. Do not breast-feed an infant while taking this medicine. Call your doctor or health care professional if you get diarrhea. Do not treat yourself. What side effects may I notice from receiving this medicine? Side effects that you should report to your doctor or health care professional as soon as possible: -allergic reactions like skin rash, itching or hives, swelling of the face, lips, or tongue -low blood counts - This drug may decrease the number of white blood cells, red blood cells and platelets. You may be at increased risk for infections and bleeding. -signs of infection - fever or chills, cough, sore throat, pain or difficulty passing urine -signs of decreased platelets or bleeding - bruising, pinpoint red spots on the skin, black, tarry stools, nosebleeds -signs of decreased red blood cells - unusually weak or tired, fainting spells, lightheadedness -breathing problems -chest pain, pressure -cough -diarrhea -jaw tightness -mouth sores -nausea and vomiting -pain, swelling, redness or irritation at the injection site -pain, tingling, numbness in the hands or feet -problems with balance, talking, walking -redness, blistering, peeling or loosening of the skin, including inside the mouth -trouble passing urine or change in the amount of urine Side effects that usually do not require medical attention (report to your doctor or health care professional if they continue or are bothersome): -changes in vision -constipation -hair loss -loss of appetite -metallic taste in the mouth or changes  in taste -stomach pain This list may not describe all possible side effects. Call your doctor for medical advice about side effects. You may report side effects to FDA at 1-800-FDA-1088. Where should I keep my medicine? This drug is given in a hospital or clinic and will not be stored at home. NOTE: This sheet is a summary. It may not cover all possible information. If you have questions about this medicine, talk to your doctor, pharmacist, or health care provider.    2016, Elsevier/Gold Standard. (2008-02-05 17:22:47)    If you develop nausea and vomiting that is not controlled by your nausea medication, call the clinic.   BELOW ARE SYMPTOMS THAT SHOULD BE REPORTED IMMEDIATELY:  *FEVER GREATER THAN 100.5 F  *CHILLS WITH OR WITHOUT FEVER  NAUSEA AND VOMITING THAT IS NOT CONTROLLED WITH YOUR NAUSEA MEDICATION  *UNUSUAL SHORTNESS OF BREATH  *UNUSUAL BRUISING OR BLEEDING  TENDERNESS IN MOUTH AND THROAT WITH OR WITHOUT PRESENCE OF ULCERS  *URINARY PROBLEMS  *BOWEL PROBLEMS  UNUSUAL RASH Items with * indicate a potential emergency and should be followed up as soon as possible.  Feel free to call the clinic you have any questions or concerns. The clinic phone number is [phone].  Please show the [place] at check-in to the Emergency Department and triage nurse.

## 2015-09-18 NOTE — Patient Instructions (Signed)
[facility] Discharge Instructions for Patients Receiving Chemotherapy  Today you received the following chemotherapy agents oxaliplatin  To help prevent nausea and vomiting after your treatment, we encourage you to take your nausea medication as directedOxaliplatin Injection What is this medicine? OXALIPLATIN (ox AL i PLA tin) is a chemotherapy drug. It targets fast dividing cells, like cancer cells, and causes these cells to die. This medicine is used to treat cancers of the colon and rectum, and many other cancers. This medicine may be used for other purposes; ask your health care provider or pharmacist if you have questions. What should I tell my health care provider before I take this medicine? They need to know if you have any of these conditions: -kidney disease -an unusual or allergic reaction to oxaliplatin, other chemotherapy, other medicines, foods, dyes, or preservatives -pregnant or trying to get pregnant -breast-feeding How should I use this medicine? This drug is given as an infusion into a vein. It is administered in a hospital or clinic by a specially trained health care professional. Talk to your pediatrician regarding the use of this medicine in children. Special care may be needed. Overdosage: If you think you have taken too much of this medicine contact a poison control center or emergency room at once. NOTE: This medicine is only for you. Do not share this medicine with others. What if I miss a dose? It is important not to miss a dose. Call your doctor or health care professional if you are unable to keep an appointment. What may interact with this medicine? -medicines to increase blood counts like filgrastim, pegfilgrastim, sargramostim -probenecid -some antibiotics like amikacin, gentamicin, neomycin, polymyxin B, streptomycin, tobramycin -zalcitabine Talk to your doctor or health care professional before taking any of these  medicines: -acetaminophen -aspirin -ibuprofen -ketoprofen -naproxen This list may not describe all possible interactions. Give your health care provider a list of all the medicines, herbs, non-prescription drugs, or dietary supplements you use. Also tell them if you smoke, drink alcohol, or use illegal drugs. Some items may interact with your medicine. What should I watch for while using this medicine? Your condition will be monitored carefully while you are receiving this medicine. You will need important blood work done while you are taking this medicine. This medicine can make you more sensitive to cold. Do not drink cold drinks or use ice. Cover exposed skin before coming in contact with cold temperatures or cold objects. When out in cold weather wear warm clothing and cover your mouth and nose to warm the air that goes into your lungs. Tell your doctor if you get sensitive to the cold. This drug may make you feel generally unwell. This is not uncommon, as chemotherapy can affect healthy cells as well as cancer cells. Report any side effects. Continue your course of treatment even though you feel ill unless your doctor tells you to stop. In some cases, you may be given additional medicines to help with side effects. Follow all directions for their use. Call your doctor or health care professional for advice if you get a fever, chills or sore throat, or other symptoms of a cold or flu. Do not treat yourself. This drug decreases your body's ability to fight infections. Try to avoid being around people who are sick. This medicine may increase your risk to bruise or bleed. Call your doctor or health care professional if you notice any unusual bleeding. Be careful brushing and flossing your teeth or using a toothpick  because you may get an infection or bleed more easily. If you have any dental work done, tell your dentist you are receiving this medicine. Avoid taking products that contain aspirin,  acetaminophen, ibuprofen, naproxen, or ketoprofen unless instructed by your doctor. These medicines may hide a fever. Do not become pregnant while taking this medicine. Women should inform their doctor if they wish to become pregnant or think they might be pregnant. There is a potential for serious side effects to an unborn child. Talk to your health care professional or pharmacist for more information. Do not breast-feed an infant while taking this medicine. Call your doctor or health care professional if you get diarrhea. Do not treat yourself. What side effects may I notice from receiving this medicine? Side effects that you should report to your doctor or health care professional as soon as possible: -allergic reactions like skin rash, itching or hives, swelling of the face, lips, or tongue -low blood counts - This drug may decrease the number of white blood cells, red blood cells and platelets. You may be at increased risk for infections and bleeding. -signs of infection - fever or chills, cough, sore throat, pain or difficulty passing urine -signs of decreased platelets or bleeding - bruising, pinpoint red spots on the skin, black, tarry stools, nosebleeds -signs of decreased red blood cells - unusually weak or tired, fainting spells, lightheadedness -breathing problems -chest pain, pressure -cough -diarrhea -jaw tightness -mouth sores -nausea and vomiting -pain, swelling, redness or irritation at the injection site -pain, tingling, numbness in the hands or feet -problems with balance, talking, walking -redness, blistering, peeling or loosening of the skin, including inside the mouth -trouble passing urine or change in the amount of urine Side effects that usually do not require medical attention (report to your doctor or health care professional if they continue or are bothersome): -changes in vision -constipation -hair loss -loss of appetite -metallic taste in the mouth or changes  in taste -stomach pain This list may not describe all possible side effects. Call your doctor for medical advice about side effects. You may report side effects to FDA at 1-800-FDA-1088. Where should I keep my medicine? This drug is given in a hospital or clinic and will not be stored at home. NOTE: This sheet is a summary. It may not cover all possible information. If you have questions about this medicine, talk to your doctor, pharmacist, or health care provider.    2016, [employer]/Gold Standard. (2008-02-05 17:22:47)    If you develop nausea and vomiting that is not controlled by your nausea medication, call the clinic.   BELOW ARE SYMPTOMS THAT SHOULD BE REPORTED IMMEDIATELY:  *FEVER GREATER THAN 100.5 F  *CHILLS WITH OR WITHOUT FEVER  NAUSEA AND VOMITING THAT IS NOT CONTROLLED WITH YOUR NAUSEA MEDICATION  *UNUSUAL SHORTNESS OF BREATH  *UNUSUAL BRUISING OR BLEEDING  TENDERNESS IN MOUTH AND THROAT WITH OR WITHOUT PRESENCE OF ULCERS  *URINARY PROBLEMS  *BOWEL PROBLEMS  UNUSUAL RASH Items with * indicate a potential emergency and should be followed up as soon as possible.  Feel free to call the clinic you have any questions or concerns. The clinic phone number is [phone].  Please show the [facility] at check-in to the Emergency Department and triage nurse.

## 2015-09-18 NOTE — Progress Notes (Signed)
Oncology Nurse Navigator Documentation  Oncology Nurse Navigator Flowsheets 09/18/2015  Navigator Location [facility]  Navigator Encounter Type Treatment  Telephone -  Abnormal Finding Date -  Confirmed Diagnosis Date -  Surgery Date 08/03/2015  Treatment Initiated Date 09/18/2015  Patient Visit Type MedOnc  Treatment Phase First Chemo Tx  Barriers/Navigation Needs No Questions;No Needs;No barriers at this time  Interventions Coordination of Care  Coordination of Care Appts--scheduled her lab/OV on 09/29/15 per MD order late in day as possible for her fiance' who works [employer]  Acuity Level 1  Time Spent with Patient 15  Reports having all her antiemetics and has been instructed in how to take them. Encouraged her to call with any needs/questions.

## 2015-09-18 NOTE — Progress Notes (Signed)
Oral Chemotherapy Pharmacist Encounter  I spoke with Ms. [name] in infusion for overview of new oral chemotherapy medication: Xeloda. Pt is doing well. The prescription has been delivered to her by [facility] and she began her first dose today 2/24.  Counseled patient on administration, dosing, side effects, safe handling, and monitoring. Side effects include but not limited to: diarrhea, hand-foot syndrome, nausea, vomiting, fatigue, decreased blood cell counts. Ms. [name] voiced understanding and appreciation.   All questions answered.  Will follow up in 1-2 weeks for adherence and toxicity management.   Thank you,  Skip Mayer, PharmD, BCPS [facility]

## 2015-09-19 DIAGNOSIS — D509 Iron deficiency anemia, unspecified: Secondary | ICD-10-CM | POA: Insufficient documentation

## 2015-09-21 ENCOUNTER — Ambulatory Visit (HOSPITAL_BASED_OUTPATIENT_CLINIC_OR_DEPARTMENT_OTHER): Payer: Managed Care, Other (non HMO)

## 2015-09-21 ENCOUNTER — Telehealth: Payer: Self-pay | Admitting: Hematology

## 2015-09-21 ENCOUNTER — Telehealth: Payer: Self-pay | Admitting: *Deleted

## 2015-09-21 ENCOUNTER — Encounter: Payer: Self-pay | Admitting: Oncology

## 2015-09-21 ENCOUNTER — Ambulatory Visit (HOSPITAL_BASED_OUTPATIENT_CLINIC_OR_DEPARTMENT_OTHER): Payer: Managed Care, Other (non HMO) | Admitting: Oncology

## 2015-09-21 ENCOUNTER — Other Ambulatory Visit (HOSPITAL_BASED_OUTPATIENT_CLINIC_OR_DEPARTMENT_OTHER): Payer: Managed Care, Other (non HMO)

## 2015-09-21 VITALS — BP 102/62 | HR 106 | Temp 98.4°F | Resp 17

## 2015-09-21 VITALS — BP 87/66 | HR 132 | Temp 98.6°F | Resp 18 | Ht 61.0 in | Wt 129.9 lb

## 2015-09-21 DIAGNOSIS — F329 Major depressive disorder, single episode, unspecified: Secondary | ICD-10-CM

## 2015-09-21 DIAGNOSIS — E860 Dehydration: Secondary | ICD-10-CM

## 2015-09-21 DIAGNOSIS — C180 Malignant neoplasm of cecum: Secondary | ICD-10-CM | POA: Diagnosis not present

## 2015-09-21 DIAGNOSIS — I959 Hypotension, unspecified: Secondary | ICD-10-CM | POA: Insufficient documentation

## 2015-09-21 DIAGNOSIS — I950 Idiopathic hypotension: Secondary | ICD-10-CM

## 2015-09-21 DIAGNOSIS — R740 Nonspecific elevation of levels of transaminase and lactic acid dehydrogenase [LDH]: Secondary | ICD-10-CM

## 2015-09-21 DIAGNOSIS — D649 Anemia, unspecified: Secondary | ICD-10-CM

## 2015-09-21 DIAGNOSIS — C182 Malignant neoplasm of ascending colon: Secondary | ICD-10-CM

## 2015-09-21 DIAGNOSIS — R7401 Elevation of levels of liver transaminase levels: Secondary | ICD-10-CM | POA: Insufficient documentation

## 2015-09-21 DIAGNOSIS — M797 Fibromyalgia: Secondary | ICD-10-CM

## 2015-09-21 DIAGNOSIS — E871 Hypo-osmolality and hyponatremia: Secondary | ICD-10-CM

## 2015-09-21 LAB — COMPREHENSIVE METABOLIC PANEL
ALBUMIN: 3 g/dL — AB (ref 3.5–5.0)
ALK PHOS: 189 U/L — AB (ref 40–150)
ALT: 61 U/L — ABNORMAL HIGH (ref 0–55)
AST: 84 U/L — AB (ref 5–34)
Anion Gap: 11 mEq/L (ref 3–11)
BILIRUBIN TOTAL: 0.47 mg/dL (ref 0.20–1.20)
BUN: 12.6 mg/dL (ref 7.0–26.0)
CO2: 21 meq/L — AB (ref 22–29)
Calcium: 9.4 mg/dL (ref 8.4–10.4)
Chloride: 94 mEq/L — ABNORMAL LOW (ref 98–109)
Creatinine: 0.8 mg/dL (ref 0.6–1.1)
EGFR: 88 mL/min/{1.73_m2} — AB (ref >90)
GLUCOSE: 126 mg/dL (ref 70–140)
POTASSIUM: 3.8 meq/L (ref 3.5–5.1)
TOTAL PROTEIN: 7.3 g/dL (ref 6.4–8.3)

## 2015-09-21 LAB — CBC WITH DIFFERENTIAL/PLATELET
BASO%: 0.3 % (ref 0.0–2.0)
BASOS ABS: 0 10*3/uL (ref 0.0–0.1)
EOS ABS: 0.2 10*3/uL (ref 0.0–0.5)
EOS%: 3.6 % (ref 0.0–7.0)
HCT: 30.6 % — ABNORMAL LOW (ref 34.8–46.6)
HEMOGLOBIN: 9.8 g/dL — AB (ref 11.6–15.9)
LYMPH%: 6.9 % — AB (ref 14.0–49.7)
MCH: 23.5 pg — AB (ref 25.1–34.0)
MCHC: 32 g/dL (ref 31.5–36.0)
MCV: 73.4 fL — AB (ref 79.5–101.0)
MONO#: 0.2 10*3/uL (ref 0.1–0.9)
MONO%: 3.6 % (ref 0.0–14.0)
NEUT#: 4.9 10*3/uL (ref 1.5–6.5)
NEUT%: 85.6 % — ABNORMAL HIGH (ref 38.4–76.8)
PLATELETS: 195 10*3/uL (ref 145–400)
RBC: 4.17 10*6/uL (ref 3.70–5.45)
RDW: 15.6 % — ABNORMAL HIGH (ref 11.2–14.5)
WBC: 5.8 10*3/uL (ref 3.9–10.3)
lymph#: 0.4 10*3/uL — ABNORMAL LOW (ref 0.9–3.3)

## 2015-09-21 MED ORDER — SODIUM CHLORIDE 0.9 % IV SOLN
1000.0000 mL | Freq: Once | INTRAVENOUS | Status: AC
  Administered 2015-09-21: 1000 mL via INTRAVENOUS

## 2015-09-21 NOTE — Telephone Encounter (Signed)
Oncology Nurse Navigator Documentation  Oncology Nurse Navigator Flowsheets 09/21/2015  Navigator Location [facility]  Navigator Encounter Type Telephone  Telephone Incoming Call;Patient Update  Abnormal Finding Date -  Confirmed Diagnosis Date -  Surgery Date -  Treatment Initiated Date -  Patient Visit Type -  Treatment Phase -  Barriers/Navigation Needs -Needs f/u today  Interventions -Consult with NP to be seen today  Coordination of Care -POF for labs/OV today-  Support Groups/Services -  Acuity -  Time Spent with Patient -  Starting Friday night: has not eating anything until bites this am. Fluid intake about 1 1/2 bottles of water total daily. Vague sense of nausea, but no emesis. Feels weak and lightheaded when she gets up. No fever or syncope. No dyspnea, but says she coughed up some blood today. She will be seen at 1pm today for lab/OV

## 2015-09-21 NOTE — Patient Instructions (Signed)

## 2015-09-21 NOTE — Progress Notes (Signed)
[place]  [phone] [phone]  Clinic follow up Note   Patient Care Team: Carol Ada, MD as PCP - General (Family Medicine) Rolm Bookbinder, MD as Consulting Physician (General Surgery) 09/21/2015   CHIEF COMPLAINTS:  Follow up stage III colon cancer   Oncology History   Cancer of right colon [facility])   Staging form: Colon and Rectum, AJCC 7th Edition     Pathologic stage from 08/03/2015: Stage IIIB (T4a, N1a, cM0) - Signed by Truitt Merle, MD on 09/04/2015       Cancer of right colon ([place])   03/31/2015 Imaging CT abdomen showed appendicitis, perforated, with periappendiceal abscess. And small pulmonary nodules.   04/03/2015 Procedure CT guided placement of going Through into the right lower abdominal quadrant with aspiration of a total of 30 mL prudent fluid.    08/03/2015 Initial Diagnosis Cancer of right colon ([place])   08/03/2015 Surgery Right hemicolectomy   08/03/2015 Pathology Results Right hemicolectomy showed invasive adenocarcinoma, moderately differentiated, likely arising from a cecal valve. One out of 40 lymph nodes were positive, (+) LVI, margins negative    08/03/2015 Miscellaneous colon cance MSI-stable     HISTORY OF PRESENTING ILLNESS:  [name] Cooley 50 y.o. female without significant PMH is here for a work in visit for her recently diagnosed stage III colon cancer. She is accompanied by her boyfriend to the clinic today.  She developed fever and headache in September 2016, was found to have ruptured appendix and was admitted to hospital. CT scan revealed a peri-appendiceal abscess. She underwent abscess drainage tube basement by interventional radiology on 04/17/2015, and a course of antibiotics. She had a multiple drainage tube exchange by IR, but the abscess did not heal. She was referred to Dr. Donne [name]. Multiple CT scan in October and November showed persistent peri-appendiceal fluid collection. She was finally taken to the operation  room on 08/03/2015. Intraoperatively, she was found to have a 2 cm whole present in the cecum, she underwent ileocectomy with an anastomosis in the hepatic fixture. 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  She had wound infection issue after surgery. It has been healing slowly, she is still on wet-to-dry dressing change twice daily, has home care nurse. She has moderate pain, 6-7/10, sometime postional, she is on oxycodone 2-3 times a day. Her appetite and eating is getting better, lost about 25lb since 03/2015, no fever and chills for the past 3-4 days, just finished abx yesterday.   CURRENT THERAPY: adjuvant chemotherapy CAPEOX, capecitabine 1556m bid on Day1-14, oxaliplatin 1358mm2 on D1, every 21 days, started on 09/18/2015, plan for 8 cycles   INTERIM HISTORY:  [name] Cooley for a work in appointment today following her first cycle of chemotherapy. The patient received her oxaliplatin this past Friday and afterward went to work, but when she got home from work went straight to bed and has not really been out of bed the entire weekend except to go to the bathroom on occasion. The patient states she took 1500 mg of Xeloda on the morning of February 24, but does not think she took her evening dose. She has not taken any additional Xeloda this past weekend. Her boyfriend states that he had difficulty waking her up all weekend. She only took sips of water and really did not eat anything at all. She did not have any fevers, but the patient states that she does not have a working thermometer in her home.  She reports nausea, but  is not taking her antiemetics. Denies vomiting and diarrhea. She reports lightheadedness when she stands or changes position quickly. Denies chest pain, shortness of breath, abdominal pain, and bleeding. Her abdominal wall wound continues healing, is quite small now. She changes the dressing once a day. No significant discharge or skin erythema.   MEDICAL  HISTORY:  Past Medical History  Diagnosis Date  . Anemia   . Depression   . Anxiety   . JP drain bleeding   . GERD (gastroesophageal reflux disease)   . Fibromyalgia     SURGICAL HISTORY: Past Surgical History  Procedure Laterality Date  . Abdominal hysterectomy    . Gastric bypass    . Colon surgery  08/03/2015  . Laparoscopic ileocecectomy Right 08/03/2015    Procedure: LAPAROSCOPIC DIAGNOSTIC RIGHT COLECTOMY;  Surgeon: Rolm Bookbinder, MD;  Location: [place];  Service: General;  Laterality: Right;  . Colostomy revision N/A 08/03/2015    Procedure: COLON RESECTION RIGHT;  Surgeon: Rolm Bookbinder, MD;  Location: [place];  Service: General;  Laterality: N/A;    SOCIAL HISTORY: Social History   Social History  . Marital Status: Married    Spouse Name: N/A  . Number of Children: N/A  . Years of Education: N/A   Occupational History  . Not on file.   Social History Main Topics  . Smoking status: Never Smoker   . Smokeless tobacco: Never Used  . Alcohol Use: 1.2 oz/week    2 Glasses of wine per week     Comment: moderate 1-2 times a month   . Drug Use: No  . Sexual Activity: Not on file   Other Topics Concern  . Not on file   Social History Narrative   Single, fiance [name]   Works [employer] 11:30am to 8 pm    FAMILY HISTORY: Family History  Problem Relation Age of Onset  . Cancer Father 35    small cell lung cancer and colon cancer   . Cancer Sister 50    small cell lung cancer  . Cancer Maternal Aunt     ovarian cancer  . Cancer Paternal Uncle     brain cancer   . Cancer Other 40    ovarian cancer (maternal niece)    ALLERGIES:  is allergic to latex; cymbalta; and penicillins.  MEDICATIONS:  Current Outpatient Prescriptions  Medication Sig Dispense Refill  . acetaminophen (TYLENOL) 500 MG tablet Take 1,000 mg by mouth every 6 (six) hours as needed for fever.    . capecitabine (XELODA) 500 MG tablet Take 3 tablets (1,500 mg total) by  mouth 2 (two) times daily after a meal. Take on days 1-14 of chemotherapy. 84 tablet 1  . cyclobenzaprine (FLEXERIL) 10 MG tablet Take 1 tablet (10 mg total) by mouth 3 (three) times daily as needed for muscle spasms. 30 tablet 0  . dexamethasone (DECADRON) 4 MG tablet Take 2 tablets (8 mg total) by mouth daily. Start day after chemo and take for 3 days after each IV chemo 30 tablet 1  . diphenhydrAMINE (BENADRYL) 25 mg capsule Take 25 mg by mouth every 6 (six) hours as needed for sleep.    Marland Kitchen escitalopram (LEXAPRO) 10 MG tablet Take 10 mg by mouth daily.    . ferrous sulfate 325 (65 FE) MG tablet Take 325 mg by mouth 2 (two) times daily with a meal.    . ondansetron (ZOFRAN) 8 MG tablet Take 1 tablet (8 mg total) by mouth as directed.  Take twice daily as needed for nausea-start on day 3 of chemotherapy 30 tablet 1  . oxyCODONE 10 MG TABS Take 1 tablet (10 mg total) by mouth every 4 (four) hours as needed for moderate pain. 30 tablet 0  . prochlorperazine (COMPAZINE) 10 MG tablet Take 1 tablet (10 mg total) by mouth every 6 (six) hours as needed for nausea or vomiting. 30 tablet 1  . sodium chloride irrigation 0.9 % irrigation Using for wet to dry dsg. To abdomen    . traMADol (ULTRAM) 50 MG tablet Take 1-2 tablets (50-100 mg total) by mouth every 6 (six) hours as needed. 30 tablet 1  . vitamin B-12 (CYANOCOBALAMIN) 1000 MCG tablet Take 500 mcg by mouth daily.    . vitamin C (ASCORBIC ACID) 500 MG tablet Take 500 mg by mouth daily.     No current facility-administered medications for this visit.   Facility-Administered Medications Ordered in Other Visits  Medication Dose Route Frequency Provider Last Rate Last Dose  . 0.9 %  sodium chloride infusion   Intravenous Continuous Maryanna Shape, NP      . clindamycin (CLEOCIN) 900 mg in dextrose 5 % 50 mL IVPB  900 mg Intravenous 60 min Pre-Op Rolm Bookbinder, MD       And  . gentamicin (GARAMYCIN) 310 mg in dextrose 5 % 50 mL IVPB  5 mg/kg  Intravenous 60 min Pre-Op Rolm Bookbinder, MD        REVIEW OF SYSTEMS:   Constitutional: Denies fevers, chills or abnormal night sweats Eyes: Denies blurriness of vision, double vision or watery eyes Ears, nose, mouth, throat, and face: Denies mucositis or sore throat Respiratory: Denies cough, dyspnea or wheezes Cardiovascular: Denies palpitation, chest discomfort or lower extremity swelling Gastrointestinal: Denies heartburn or change in bowel habits Skin: Denies abnormal skin rashes Lymphatics: Denies new lymphadenopathy or easy bruising Neurological:Denies numbness, tingling or new weaknesses Behavioral/Psych: Mood is stable, no new changes  All other systems were reviewed with the patient and are negativeexcept as noted in the interim history .  PHYSICAL EXAMINATION: ECOG PERFORMANCE STATUS: 2 - Symptomatic, <50% confined to bed  Filed Vitals:   09/21/15 1350  BP: 87/66  Pulse: 132  Temp: 98.6 F (37 C)  Resp: 18   Filed Weights   09/21/15 1350  Weight: 129 lb 14.4 oz (58.922 kg)    GENERAL:alert, no distress and comfortable SKIN: Tenting of skin is noted. skin color, texture, no rashes or significant lesions EYES: normal, conjunctiva are pink and non-injected, sclera clear OROPHARYNX: oral mucosa is dry. no exudate, no erythema and lips, buccal mucosa, and tongue normal  NECK: supple, thyroid normal size, non-tender, without nodularity LYMPH:  no palpable lymphadenopathy in the cervical, axillary or inguinal   LUNGS: clear to auscultation and percussion with normal breathing effort HEART: regular rate & rhythm and no murmurs and no lower extremity edema ABDOMEN:abdomen soft, non-tender and normal bowel sounds,  Her surgical wound was visualized and there is a small amount of serosanguineous drainage. Wound is healing well.   Musculoskeletal:no cyanosis of digits and no clubbing  PSYCH: alert & oriented x 3 with fluent speech NEURO: no focal motor/sensory  deficits  LABORATORY DATA:  I have reviewed the data as listed CBC Latest Ref Rng 09/21/2015 09/18/2015 09/04/2015  WBC 3.9 - 10.3 10e3/uL 5.8 5.4 4.6  Hemoglobin 11.6 - 15.9 g/dL 9.8(L) 9.0(L) 9.9(L)  Hematocrit 34.8 - 46.6 % 30.6(L) 29.5(L) 32.1(L)  Platelets 145 - 400 10e3/uL 195 254  [place]  08/04/15 0346 08/06/15 0427 08/10/15 0645 08/11/15 0013  09/04/15 1013 09/18/15 0935 09/21/15 1329  NA 136 134*  --  134*  < > 140 138 126 Repeated and Verified*  K 3.8 3.9  --  3.5  < > 4.0 4.5 3.8  CL 103 104  --  96*  --   --   --   --   CO2 25 21*  --  25  < > 26 23 21*  GLUCOSE 157* 122*  --  104*  < > 89 82 126  BUN 5* <5*  --  7  < > 5.9* 17.1 12.6  CREATININE 0.63 0.45 0.46 0.51  < > 0.6 0.7 0.8  CALCIUM 8.6* 9.0  --  8.7*  < > 9.3 9.4 9.4  GFRNONAA >60 >60 >60 >60  --   --   --   --   GFRAA >60 >60 >60 >60  --   --   --   --   PROT  --   --   --   --   < > 7.3 7.1 7.3  ALBUMIN  --   --   --   --   < > 3.4* 3.4* 3.0*  AST  --   --   --   --   < > 15 12 84*  ALT  --   --   --   --   < > 9 <9 61*  ALKPHOS  --   --   --   --   < > 122 105 189*  BILITOT  --   --   --   --   < > <0.30 <0.30 0.47  < > = values in this interval not displayed. CEA  Status: Finalresult Visible to patient:  MyChart Nextappt: 09/18/2015 at 09:30 AM in [facility] 4) Dx:  Cancer of right colon ([place])         Ref Range 2wk ago    CEA 0.0 - 4.7 ng/mL 1.3        Results for ANGELINE, TRICK (MRN [id number]) as of 09/19/2015 22:11  Ref. Range 09/18/2015 09:35  Iron Latest Ref Range: 41-142 ug/dL 18 (L)  UIBC Latest Ref Range: 120-384 ug/dL 367  TIBC Latest Ref Range: 236-444 ug/dL 385  %SAT Latest Ref Range: 21-57 % 5 (L)  Ferritin Latest Ref Range: 9-269 ng/ml 12    PATHOLOGY REPORT Diagnosis 08/03/2015 Colon, segmental resection, Right - INVASIVE ADENOCARCINOMA WITH ABUNDANT EXTRACELLULAR MUCIN, MODERATELY DIFFERENTIATED, LIKELY ARISING FROM ILEOCECAL  VALVE. - ADENOCARCINOMA EXTENDS AT LEAST INTO THE PERICOLONIC SOFT TISSUE AND IS ASSOCIATED WITH TRANSMURAL DEFECT. - THE PROXIMAL AND DISTAL RESECTION MARGINS ARE NEGATIVE FOR ADENOCARCINOMA. - ACELLULAR MUCIN IS GROSSLY PRESENT AT THE SEROSAL SURFACE. - LYMPHOVASCULAR INVASION IS IDENTIFIED. - METASTATIC CARCINOMA IN 1 OF 40 LYMPH NODES (1/40). - SEE ONCOLOGY TABLE BELOW. Microscopic Comment COLON AND RECTUM (INCLUDING TRANS-ANAL RESECTION): Specimen: Right colon and terminal ileum. Procedure: Resection. Tumor site: Likely ileocecal valve. Specimen integrity: Transmural defect(s). Macroscopic intactness of mesorectum: N/A Macroscopic tumor perforation: Present. Invasive tumor: Maximum size: At least 2.5 cm. Histologic type(s): Adenocarcinoma with abundant extracellular mucin. Histologic grade and differentiation: G2: moderately differentiated. Type of polyp in which invasive carcinoma arose: Tubular adenoma. Microscopic extension of invasive tumor: Adenocarcinoma extends into pericolonic soft tissue and acellular mucin is grossly present at the serosal surface. Lymph-Vascular invasion: Present. Peri-neural invasion: Not identified. Tumor deposit(s) (discontinuous  extramural extension): Not identified. Resection margins: Proximal margin: Negative for carcinoma. Distal margin: Negative for carcinoma. Circumferential (radial) (posterior ascending, posterior descending; lateral and posterior mid-rectum; and entire lower 1/3 rectum): Acellular mucin is present at the circumferential tissue edge. Treatment effect (neo-adjuvant therapy): N/A Additional polyp(s): Not identified. Non-neoplastic findings: No significant findings. Lymph nodes: number examined 40; number positive: 1 Pathologic Staging: at least pT4a, pN1a. Ancillary studies: MMR by IHC and MSI by PCR will be performed and the results reported separately. Additional studies can be performed upon clinician  request.   ADDITIONAL INFORMATION: Mismatch Repair (MMR) Protein Immunohistochemistry (IHC) IHC Expression Result: MLH1: EQUIVOCAL MSH2: EQUIVOCAL MSH6: EQUIVOCAL PMS2: EQUIVOCAL * Internal control demonstrates intact nuclear expression Interpretation: EQUIVOCAL The tumor shows partial staining with all anibodies and hence the results are considered equivocal. Correlated with molecular based MSI testing is strongly recommended.    RADIOGRAPHIC STUDIES: I have personally reviewed the radiological images as listed and agreed with the findings in the report. Ct Chest Wo Contrast  09/03/2015  CLINICAL DATA:  Colon cancer staging.  Initial evaluation EXAM: CT CHEST WITHOUT CONTRAST TECHNIQUE: Multidetector CT imaging of the chest was performed following the standard protocol without IV contrast. COMPARISON:  CT 07/02/2015 common 04/01/2015 FINDINGS: Mediastinum/Nodes: No axillary or supraclavicular adenopathy. No mediastinal hilar adenopathy. No pericardial fluid. Esophagus normal. Lungs/Pleura: 6 mm nodule in the RIGHT lower lobe (image 38, series 5). This region was not imaged on comparison CT abdomen. 4 mm nodule in the LEFT lower lobe (image 41, series 5) is not changed. Upper abdomen: Limited view of the liver, kidneys, pancreas are unremarkable. Normal adrenal glands. Post bariatric surgery. Musculoskeletal: No aggressive osseous lesion. IMPRESSION: 1. A 6 mm RIGHT lower lobe nodule not previously imaged. Recommend attention on follow-up. 2. Stable small LEFT lower lobe pulmonary nodule compared to prior CT. 3. No mediastinal lymphadenopathy. Electronically Signed   By: Suzy Bouchard M.D.   On: 09/03/2015 17:34    ASSESSMENT & PLAN:  50 year old Caucasian female, with past medical history of fibromyalgia, depression, presented with ruptured cecum and pericolonic abscess, required multiple drainage, antibiotics, and eventually right colectomy.  1. Right colon cancer, cecum, moderately  differentiated adenocarcinoma, pT4N1aM0, stage IIIA, with perforation, MSI-stable  - I reviewed her scan findings, and surgical pathology results in great details with patient and her boyfriend. -Her CT chest was negative for metastasis. - she has locally advanced stage III  Disease, with particular high risk features of T4 tumor, proliferation for 3-4 months,  And positive lymph nodes, she is at very high risk for cancer recurrence, especially peritoneal carcinomatosis. - I strongly recommend her to consider adjuvant chemotherapy, FOLFOX or CAPEOX. She opted CAPEOX,with oxaliplatin 130 mg/m, on day one, andcapecitabien 1000 mg/m (dose can be reduced to 837m/m2 if tolerance issue) twice daily, on day 1-14, every 21 day cycle, a total of 8 cycles  - the patient received her first dose of oxaliplatin on every 24th and appears to avoid taken one or possibly 2 doses of her Xeloda. The patient will hold her Xeloda at this time. We will continue to monitor her and if she improves will resume her Xeloda.   2. Abdominal wound, history of abdominal abscess -The wound is healing well, she will continue dressing change and a follow-up with Dr. WDonne [name] 3. Genetics - given her young age and multiple family history of colon, breast and ovary cancer, I'll refer her to genetics to ruled out inheritable  Cancer syndrome  4. Anemia -  likely secondary to surgery,  Chronic infection and GI bleeding from the tumor -Iron study today showed low serum iron, transferrin saturation and ferritin 12, which is consistent with iron deficiency -Given her ongoing chemotherapy, I recommend her to have IV Feraheme, to improve her anemia quickly. -continue iron pill over-the-counter 1-2 tablets a day  5. Fibromyalgia and depression - she will continue medication, and follow-up with her primary care physician  6.  dehydration  - The patient has not been eating or drinking well since chemotherapy.  - She will receive 1 L  of normal saline in our office today.  - Patient encouraged to drink as much fluid as possible at home. She may also drink Gatorade and other sports drinks for electrolytes.   7. Hyponatremia - Due to volume depletion - IV fluids as noted above in our office today.  8. Hypotension -Due to dehydration. - IV fluids as noted above.  - Medications reviewed and she is not taking any diuretics or other medications for her blood pressure.  9. Transaminitis -Likely due to recent oxaliplatin. -We will follow her CMET.  Plan -IV fluids as noted above  -Return to symptom management clinic on February 28 for repeat labs, visit, and possible IV fluids  -Continue to hold the Xeloda for now and will consider resuming once the patient's clinical status improves.   Plan reviewed with Dr. Benay Spice and Dr. Ernestina Penna absence.  All questions were answered. The patient knows to call the clinic with any problems, questions or concerns.  I spent 30 minutes counseling the patient face to face, more than 50% was on counseling.     Mikey Bussing, NP 09/21/2015

## 2015-09-21 NOTE — Telephone Encounter (Signed)
appt made per 2/27 pof. Pt will receive print out in treatment room

## 2015-09-21 NOTE — Telephone Encounter (Signed)
Scheduled patient appt per pof.  °

## 2015-09-22 ENCOUNTER — Telehealth: Payer: Self-pay | Admitting: Nurse Practitioner

## 2015-09-22 ENCOUNTER — Encounter: Payer: Self-pay | Admitting: Nurse Practitioner

## 2015-09-22 ENCOUNTER — Ambulatory Visit (HOSPITAL_BASED_OUTPATIENT_CLINIC_OR_DEPARTMENT_OTHER): Payer: Managed Care, Other (non HMO)

## 2015-09-22 ENCOUNTER — Ambulatory Visit (HOSPITAL_BASED_OUTPATIENT_CLINIC_OR_DEPARTMENT_OTHER): Payer: Managed Care, Other (non HMO) | Admitting: Nurse Practitioner

## 2015-09-22 ENCOUNTER — Other Ambulatory Visit (HOSPITAL_BASED_OUTPATIENT_CLINIC_OR_DEPARTMENT_OTHER): Payer: Managed Care, Other (non HMO)

## 2015-09-22 VITALS — BP 110/76 | HR 118 | Temp 96.9°F | Resp 18 | Ht 61.0 in | Wt 129.6 lb

## 2015-09-22 DIAGNOSIS — E860 Dehydration: Secondary | ICD-10-CM | POA: Diagnosis not present

## 2015-09-22 DIAGNOSIS — C189 Malignant neoplasm of colon, unspecified: Secondary | ICD-10-CM | POA: Diagnosis not present

## 2015-09-22 DIAGNOSIS — C182 Malignant neoplasm of ascending colon: Secondary | ICD-10-CM

## 2015-09-22 DIAGNOSIS — R740 Nonspecific elevation of levels of transaminase and lactic acid dehydrogenase [LDH]: Secondary | ICD-10-CM | POA: Diagnosis not present

## 2015-09-22 DIAGNOSIS — E46 Unspecified protein-calorie malnutrition: Secondary | ICD-10-CM

## 2015-09-22 DIAGNOSIS — R509 Fever, unspecified: Secondary | ICD-10-CM | POA: Insufficient documentation

## 2015-09-22 DIAGNOSIS — R7401 Elevation of levels of liver transaminase levels: Secondary | ICD-10-CM

## 2015-09-22 DIAGNOSIS — E8809 Other disorders of plasma-protein metabolism, not elsewhere classified: Secondary | ICD-10-CM

## 2015-09-22 DIAGNOSIS — G4700 Insomnia, unspecified: Secondary | ICD-10-CM

## 2015-09-22 DIAGNOSIS — E871 Hypo-osmolality and hyponatremia: Secondary | ICD-10-CM

## 2015-09-22 LAB — URINALYSIS, MICROSCOPIC - CHCC
Bilirubin (Urine): NEGATIVE
Blood: NEGATIVE
Glucose: NEGATIVE mg/dL
Ketones: 80 mg/dL
Leukocyte Esterase: NEGATIVE
Nitrite: NEGATIVE
Protein: 30 mg/dL
Specific Gravity, Urine: 1.03 (ref 1.003–1.035)
Urobilinogen, UR: 0.2 mg/dL (ref 0.2–1)
pH: 6 (ref 4.6–8.0)

## 2015-09-22 LAB — COMPREHENSIVE METABOLIC PANEL
ALBUMIN: 3 g/dL — AB (ref 3.5–5.0)
ALK PHOS: 394 U/L — AB (ref 40–150)
ALT: 157 U/L — ABNORMAL HIGH (ref 0–55)
AST: 201 U/L (ref 5–34)
Anion Gap: 12 mEq/L — ABNORMAL HIGH (ref 3–11)
BUN: 10 mg/dL (ref 7.0–26.0)
CO2: 19 meq/L — AB (ref 22–29)
Calcium: 9.6 mg/dL (ref 8.4–10.4)
Chloride: 101 mEq/L (ref 98–109)
Creatinine: 0.7 mg/dL (ref 0.6–1.1)
GLUCOSE: 165 mg/dL — AB (ref 70–140)
POTASSIUM: 4 meq/L (ref 3.5–5.1)
SODIUM: 132 meq/L — AB (ref 136–145)
Total Bilirubin: 0.42 mg/dL (ref 0.20–1.20)
Total Protein: 7.4 g/dL (ref 6.4–8.3)

## 2015-09-22 LAB — CBC WITH DIFFERENTIAL/PLATELET
BASO%: 0 % (ref 0.0–2.0)
Basophils Absolute: 0 10*3/uL (ref 0.0–0.1)
EOS%: 0.6 % (ref 0.0–7.0)
Eosinophils Absolute: 0 10*3/uL (ref 0.0–0.5)
HEMATOCRIT: 30.9 % — AB (ref 34.8–46.6)
HGB: 9.8 g/dL — ABNORMAL LOW (ref 11.6–15.9)
LYMPH#: 0.2 10*3/uL — AB (ref 0.9–3.3)
LYMPH%: 11.9 % — ABNORMAL LOW (ref 14.0–49.7)
MCH: 22.8 pg — ABNORMAL LOW (ref 25.1–34.0)
MCHC: 31.7 g/dL (ref 31.5–36.0)
MCV: 72 fL — ABNORMAL LOW (ref 79.5–101.0)
MONO#: 0.1 10*3/uL (ref 0.1–0.9)
MONO%: 4 % (ref 0.0–14.0)
NEUT#: 1.5 10*3/uL (ref 1.5–6.5)
NEUT%: 83.5 % — AB (ref 38.4–76.8)
PLATELETS: 214 10*3/uL (ref 145–400)
RBC: 4.29 10*6/uL (ref 3.70–5.45)
RDW: 15.6 % — ABNORMAL HIGH (ref 11.2–14.5)
WBC: 1.8 10*3/uL — ABNORMAL LOW (ref 3.9–10.3)

## 2015-09-22 MED ORDER — CIPROFLOXACIN HCL 500 MG PO TABS: 500.0000 mg | ORAL_TABLET | Freq: Two times a day (BID) | ORAL | Status: DC

## 2015-09-22 MED ORDER — SODIUM CHLORIDE 0.9 % IV SOLN
1500.0000 mL | Freq: Once | INTRAVENOUS | Status: AC
  Administered 2015-09-22: 1000 mL via INTRAVENOUS

## 2015-09-22 NOTE — Progress Notes (Signed)
Correction: Pt had no complaints of insomnia today.   DOCUMENTATION PLACED ON INCORRECT PT.

## 2015-09-22 NOTE — Assessment & Plan Note (Signed)
Alkaline phosphatase has significantly increased this week from 189 up to 394.  It is also noted that patient's liver enzymes have greatly increased as well.  Reviewed all findings with both Dr. Benay Spice and general surgeon, Dr. Donne [name].  Will have patient return this coming Thursday, 09/24/2015 for repeat labs in follow-up.

## 2015-09-22 NOTE — Assessment & Plan Note (Signed)
Patient reports fever to maximum 101.1 last night.  Patient states that she has been feeling very fatigued; with generalized weakness within the past week.  She denies any nausea, vomiting, diarrhea, or constipation.  She denies any new pain.  She denies any UTI symptoms.    Patient has a periumbilical surgical wound that is healing with secondary intention.  She states that this was initially a very large wound that she has been changing wet-to-dry dressings on a daily basis.  She states that she's had no recent issues with her abdominal surgical wound.  Liver enzymes have increased within the past few days.  AST has increased from 84 yesterday up to 201 today.  ALT has increased from 61 yesterday up to 157.  Exam today reveals bowel sounds positive in all 4 quads, and abdomen nontender with palpation.  Patients.  Umbilical surgical wound remains open; but appears to be healing well.  There is moist, pink granulation tissue to the site.  There is no foul odor or purulent discharge.  There is no surrounding edema, erythema, warmth, or tenderness.  See photo.  Review of patient's past scans; specifically the CT of the abdomen and pelvis with out contrast that was obtained on 08/10/2015 revealed no liver issues.  Patient underwent the  colectomy per Dr. Rolm Bookbinder, general surgeon on 08/03/2015.  Reviewed all findings with Dr. Benay Spice; and then spoke directly to Dr. Donne [name] as well regarding all symptoms and findings today.  Vital signs obtained today were essentially within normal limits with the exception of a heart rate elevated to 118.  Patient was afebrile with temperature 96.9.  Patient appears fatigued, weak; but nontoxic.  Blood counts obtained today were within normal limits.  Urinalysis obtained today revealed negative blood, negative nitrites, negative leukocyte esterase, WBCs 3-6, and moderate bacteria.  Both.  Umbilical surgical wound culture and blood cultures pending.  Also,  urine culture is pending.  Will treat patient for mild UTI symptoms with Cipro antibiotics.  Dr. Donne [name].  Advised full fever workup which would include blood cultures and urine cultures.  He advised IV fluid rehydration; and close monitoring.  Patient will return this coming Thursday, 09/24/2015 for repeat labs and visit.  Patient knows to call/return to go directly to the emergency department for any worsening symptoms whatsoever.

## 2015-09-22 NOTE — Assessment & Plan Note (Signed)
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  She has been taking Xeloda oral therapy as well.  She has been holding the Xeloda oral therapy since this weekend.  Blood counts obtained today were essentially normal.  See further notes regarding dehydration, fever, onset, significant dehydration, and transaminitis.  Patient is scheduled to return this coming Thursday, 09/24/2015 for labs and a visit.  She is also scheduled to return on 09/29/2015 for labs and a follow-up visit as well.

## 2015-09-22 NOTE — Patient Instructions (Signed)

## 2015-09-22 NOTE — Assessment & Plan Note (Signed)
Albumin has decreased from 3.4 down to 3.0.  Patient admits to very poor oral intake recently.  Patient was encouraged to push protein in her diet is much as possible.  We'll continue to monitor closely.

## 2015-09-22 NOTE — Progress Notes (Signed)
SYMPTOM MANAGEMENT CLINIC   HPI: [name] 50 y.o. female diagnosed with colon cancer.  Patient is status post colectomy on 08/03/2015 per Dr. Rolm Bookbinder.  Patient is currently undergoing oxaliplatin chemotherapy regimen, and Xeloda oral therapy.   Patient reports fever to maximum 101.1 last night.  Patient states that she has been feeling very fatigued; with generalized weakness within the past week.  She denies any nausea, vomiting, diarrhea, or constipation.  She denies any new pain.  She denies any UTI symptoms.    Patient has a periumbilical surgical wound that is healing with secondary intention.  She states that this was initially a very large wound that she has been changing wet-to-dry dressings on a daily basis.  She states that she's had no recent issues with her abdominal surgical wound.  Liver enzymes have increased within the past few days.  AST has increased from 84 yesterday up to 201 today.  ALT has increased from 61 yesterday up to 157.  Exam today reveals bowel sounds positive in all 4 quads, and abdomen nontender with palpation.  Patients.  Umbilical surgical wound remains open; but appears to be healing well.  There is moist, pink granulation tissue to the site.  There is no foul odor or purulent discharge.  There is no surrounding edema, erythema, warmth, or tenderness.  See photo.  Review of patient's past scans; specifically the CT of the abdomen and pelvis with out contrast that was obtained on 08/10/2015 revealed no liver issues.  Patient underwent the  colectomy per Dr. Rolm Bookbinder, general surgeon on 08/03/2015.  Reviewed all findings with Dr. Benay Spice; and then spoke directly to Dr. Donne [name] as well regarding all symptoms and findings today.  Vital signs obtained today were essentially within normal limits with the exception of a heart rate elevated to 118.  Patient was afebrile with temperature 96.9.  Patient appears fatigued, weak; but  nontoxic.  Blood counts obtained today were within normal limits.  Urinalysis obtained today revealed negative blood, negative nitrites, negative leukocyte esterase, WBCs 3-6, and moderate bacteria.  Both.  Umbilical surgical wound culture and blood cultures pending.  Also, urine culture is pending.  Will treat patient for mild UTI symptoms with Cipro antibiotics.  Dr. Donne [name].  Advised full fever workup which would include blood cultures and urine cultures.  He advised IV fluid rehydration; and close monitoring.  Patient will return this coming Thursday, 09/24/2015 for repeat labs and visit.  Patient knows to call/return to go directly to the emergency department for any worsening symptoms whatsoever.  HPI  Review of Systems  Constitutional: Positive for fever, chills and malaise/fatigue.  Neurological: Positive for weakness.  All other systems reviewed and are negative.   Past Medical History  Diagnosis Date  . Anemia   . Depression   . Anxiety   . JP drain bleeding   . GERD (gastroesophageal reflux disease)   . Fibromyalgia     Past Surgical History  Procedure Laterality Date  . Abdominal hysterectomy    . Gastric bypass    . Colon surgery  08/03/2015  . Laparoscopic ileocecectomy Right 08/03/2015    Procedure: LAPAROSCOPIC DIAGNOSTIC RIGHT COLECTOMY;  Surgeon: Rolm Bookbinder, MD;  Location: [facility];  Service: General;  Laterality: Right;  . Colostomy revision N/A 08/03/2015    Procedure: COLON RESECTION RIGHT;  Surgeon: Rolm Bookbinder, MD;  Location: [facility];  Service: General;  Laterality: N/A;    has Perforated appendicitis; Appendiceal abscess; Appendicitis, acute; S/P colectomy; Cancer  of right colon ([place]); Anemia, iron deficiency; Dehydration; Hyponatremia; Hypotension; Transaminitis; Hyperphosphatemia; Hypoalbuminemia due to protein-calorie malnutrition ([place]); Insomnia; and Fever on her problem list.    is allergic to latex; cymbalta; and penicillins.      Medication List       This list is accurate as of: 09/22/15  5:53 PM.  Always use your most recent med list.               acetaminophen 500 MG tablet  Commonly known as:  TYLENOL  Take 1,000 mg by mouth every 6 (six) hours as needed for fever.     capecitabine 500 MG tablet  Commonly known as:  XELODA  Take 3 tablets (1,500 mg total) by mouth 2 (two) times daily after a meal. Take on days 1-14 of chemotherapy.     ciprofloxacin 500 MG tablet  Commonly known as:  CIPRO  Take 1 tablet (500 mg total) by mouth 2 (two) times daily.     cyclobenzaprine 10 MG tablet  Commonly known as:  FLEXERIL  Take 1 tablet (10 mg total) by mouth 3 (three) times daily as needed for muscle spasms.     dexamethasone 4 MG tablet  Commonly known as:  DECADRON  Take 2 tablets (8 mg total) by mouth daily. Start day after chemo and take for 3 days after each IV chemo     diphenhydrAMINE 25 mg capsule  Commonly known as:  BENADRYL  Take 25 mg by mouth every 6 (six) hours as needed for sleep.     escitalopram 10 MG tablet  Commonly known as:  LEXAPRO  Take 10 mg by mouth daily.     ferrous sulfate 325 (65 FE) MG tablet  Take 325 mg by mouth 2 (two) times daily with a meal.     ondansetron 8 MG tablet  Commonly known as:  ZOFRAN  Take 1 tablet (8 mg total) by mouth as directed. Take twice daily as needed for nausea-start on day 3 of chemotherapy     Oxycodone HCl 10 MG Tabs  Take 1 tablet (10 mg total) by mouth every 4 (four) hours as needed for moderate pain.     prochlorperazine 10 MG tablet  Commonly known as:  COMPAZINE  Take 1 tablet (10 mg total) by mouth every 6 (six) hours as needed for nausea or vomiting.     sodium chloride irrigation 0.9 % irrigation  Using for wet to dry dsg. To abdomen     traMADol 50 MG tablet  Commonly known as:  ULTRAM  Take 1-2 tablets (50-100 mg total) by mouth every 6 (six) hours as needed.     vitamin B-12 1000 MCG tablet  Commonly known as:   CYANOCOBALAMIN  Take 500 mcg by mouth daily.     vitamin C 500 MG tablet  Commonly known as:  ASCORBIC ACID  Take 500 mg by mouth daily.         PHYSICAL EXAMINATION  Oncology Vitals 09/22/2015 09/21/2015  Height 155 cm -  Weight 58.786 kg -  Weight (lbs) 129 lbs 10 oz -  BMI (kg/m2) 24.49 kg/m2 -  Temp 96.9 98.4  Pulse 118 106  Resp 18 17  SpO2 100 100  BSA (m2) 1.59 m2 -   BP Readings from Last 2 Encounters:  09/22/15 110/76  09/21/15 102/62    Physical Exam  Constitutional: She is oriented to person, place, and time. She appears malnourished and dehydrated. She appears unhealthy. She appears  cachectic.  HENT:  Head: Normocephalic and atraumatic.  Mouth/Throat: Oropharynx is clear and moist.  Eyes: Conjunctivae and EOM are normal. Pupils are equal, round, and reactive to light. Right eye exhibits no discharge. Left eye exhibits no discharge. No scleral icterus.  Neck: Normal range of motion. Neck supple. No JVD present. No tracheal deviation present. No thyromegaly present.  Cardiovascular: Regular rhythm, normal heart sounds and intact distal pulses.   Tachycardic  Pulmonary/Chest: Effort normal and breath sounds normal. No respiratory distress. She has no wheezes. She has no rales. She exhibits no tenderness.  Abdominal: Soft. Bowel sounds are normal. She exhibits no distension and no mass. There is no tenderness. There is no rebound and no guarding.  Musculoskeletal: Normal range of motion. She exhibits no edema or tenderness.  Lymphadenopathy:    She has no cervical adenopathy.  Neurological: She is alert and oriented to person, place, and time. Gait normal.  Skin: Skin is warm and dry. No rash noted. No erythema. There is pallor.  Patient has a resolving open.  Umbilical surgical wound with pink granulation tissue.  There is no evidence of infection to site.  Psychiatric: Affect normal.    LABORATORY DATA:. Appointment on 09/22/2015  Component Date Value Ref  Range Status  . Glucose 09/22/2015 Negative  Negative mg/dL Final  . Bilirubin (Urine) 09/22/2015 Negative  Negative Final  . Ketones 09/22/2015 80  Negative mg/dL Final  . Specific Gravity, Urine 09/22/2015 1.030  1.003 - 1.035 Final  . Blood 09/22/2015 Negative  Negative Final  . pH 09/22/2015 6.0  4.6 - 8.0 Final  . Protein 09/22/2015 30  Negative- <30 mg/dL Final  . Urobilinogen, UR 09/22/2015 0.2  0.2 - 1 mg/dL Final  . Nitrite 09/22/2015 Negative  Negative Final  . Leukocyte Esterase 09/22/2015 Negative  Negative Final  . RBC / HPF 09/22/2015 0-2  0 - 2 Final  . WBC, UA 09/22/2015 3-6  0 - 2 Final  . Bacteria, UA 09/22/2015 Moderate  Negative- Trace Final  . Casts 09/22/2015 Granular and Hyaline  Negative Final  . Epithelial Cells 09/22/2015 Few  Negative- Few Final  . Mucus, UA 09/22/2015 Small  Negative- Small Final  Appointment on 09/22/2015  Component Date Value Ref Range Status  . Sodium 09/22/2015 132* 136 - 145 mEq/L Final  . Potassium 09/22/2015 4.0  3.5 - 5.1 mEq/L Final  . Chloride 09/22/2015 101  98 - 109 mEq/L Final  . CO2 09/22/2015 19* 22 - 29 mEq/L Final  . Glucose 09/22/2015 165* 70 - 140 mg/dl Final   Glucose reference range is for nonfasting patients. Fasting glucose reference range is 70- 100.  Marland Kitchen BUN 09/22/2015 10.0  7.0 - 26.0 mg/dL Final  . Creatinine 09/22/2015 0.7  0.6 - 1.1 mg/dL Final  . Total Bilirubin 09/22/2015 0.42  0.20 - 1.20 mg/dL Final  . Alkaline Phosphatase 09/22/2015 394* 40 - 150 U/L Final  . AST 09/22/2015 201* 5 - 34 U/L Final  . ALT 09/22/2015 157* 0 - 55 U/L Final  . Total Protein 09/22/2015 7.4  6.4 - 8.3 g/dL Final  . Albumin 09/22/2015 3.0* 3.5 - 5.0 g/dL Final  . Calcium 09/22/2015 9.6  8.4 - 10.4 mg/dL Final  . Anion Gap 09/22/2015 12* 3 - 11 mEq/L Final  . EGFR 09/22/2015 >90  >90 ml/min/1.73 m2 Final   eGFR is calculated using the CKD-EPI Creatinine Equation (2009)  . WBC 09/22/2015 1.8* 3.9 - 10.3 10e3/uL Final  . NEUT#  09/22/2015  1.5  1.5 - 6.5 10e3/uL Final  . HGB 09/22/2015 9.8* 11.6 - 15.9 g/dL Final  . HCT 09/22/2015 30.9* 34.8 - 46.6 % Final  . Platelets 09/22/2015 214  145 - 400 10e3/uL Final  . MCV 09/22/2015 72.0* 79.5 - 101.0 fL Final  . MCH 09/22/2015 22.8* 25.1 - 34.0 pg Final  . MCHC 09/22/2015 31.7  31.5 - 36.0 g/dL Final  . RBC 09/22/2015 4.29  3.70 - 5.45 10e6/uL Final  . RDW 09/22/2015 15.6* 11.2 - 14.5 % Final  . lymph# 09/22/2015 0.2* 0.9 - 3.3 10e3/uL Final  . MONO# 09/22/2015 0.1  0.1 - 0.9 10e3/uL Final  . Eosinophils Absolute 09/22/2015 0.0  0.0 - 0.5 10e3/uL Final  . Basophils Absolute 09/22/2015 0.0  0.0 - 0.1 10e3/uL Final  . NEUT% 09/22/2015 83.5* 38.4 - 76.8 % Final  . LYMPH% 09/22/2015 11.9* 14.0 - 49.7 % Final  . MONO% 09/22/2015 4.0  0.0 - 14.0 % Final  . EOS% 09/22/2015 0.6  0.0 - 7.0 % Final  . BASO% 09/22/2015 0.0  0.0 - 2.0 % Final  Appointment on 09/21/2015  Component Date Value Ref Range Status  . WBC 09/21/2015 5.8  3.9 - 10.3 10e3/uL Final  . NEUT# 09/21/2015 4.9  1.5 - 6.5 10e3/uL Final  . HGB 09/21/2015 9.8* 11.6 - 15.9 g/dL Final  . HCT 09/21/2015 30.6* 34.8 - 46.6 % Final  . Platelets 09/21/2015 195  145 - 400 10e3/uL Final  . MCV 09/21/2015 73.4* 79.5 - 101.0 fL Final  . MCH 09/21/2015 23.5* 25.1 - 34.0 pg Final  . MCHC 09/21/2015 32.0  31.5 - 36.0 g/dL Final  . RBC 09/21/2015 4.17  3.70 - 5.45 10e6/uL Final  . RDW 09/21/2015 15.6* 11.2 - 14.5 % Final  . lymph# 09/21/2015 0.4* 0.9 - 3.3 10e3/uL Final  . MONO# 09/21/2015 0.2  0.1 - 0.9 10e3/uL Final  . Eosinophils Absolute 09/21/2015 0.2  0.0 - 0.5 10e3/uL Final  . Basophils Absolute 09/21/2015 0.0  0.0 - 0.1 10e3/uL Final  . NEUT% 09/21/2015 85.6* 38.4 - 76.8 % Final  . LYMPH% 09/21/2015 6.9* 14.0 - 49.7 % Final  . MONO% 09/21/2015 3.6  0.0 - 14.0 % Final  . EOS% 09/21/2015 3.6  0.0 - 7.0 % Final  . BASO% 09/21/2015 0.3  0.0 - 2.0 % Final  . Sodium 09/21/2015 126 Repeated and Verified* 136 - 145  mEq/L Final  . Potassium 09/21/2015 3.8  3.5 - 5.1 mEq/L Final  . Chloride 09/21/2015 94* 98 - 109 mEq/L Final  . CO2 09/21/2015 21* 22 - 29 mEq/L Final  . Glucose 09/21/2015 126  70 - 140 mg/dl Final   Glucose reference range is for nonfasting patients. Fasting glucose reference range is 70- 100.  Marland Kitchen BUN 09/21/2015 12.6  7.0 - 26.0 mg/dL Final  . Creatinine 09/21/2015 0.8  0.6 - 1.1 mg/dL Final  . Total Bilirubin 09/21/2015 0.47  0.20 - 1.20 mg/dL Final  . Alkaline Phosphatase 09/21/2015 189* 40 - 150 U/L Final  . AST 09/21/2015 84* 5 - 34 U/L Final  . ALT 09/21/2015 61* 0 - 55 U/L Final  . Total Protein 09/21/2015 7.3  6.4 - 8.3 g/dL Final  . Albumin 09/21/2015 3.0* 3.5 - 5.0 g/dL Final  . Calcium 09/21/2015 9.4  8.4 - 10.4 mg/dL Final  . Anion Gap 09/21/2015 11  3 - 11 mEq/L Final  . EGFR 09/21/2015 88* >90 ml/min/1.73 m2 Final   eGFR is calculated using the  CKD-EPI Creatinine Equation (2009)       RADIOGRAPHIC STUDIES: No results found.  ASSESSMENT/PLAN:    Transaminitis Liver enzymes have increased within the past few days.  AST has increased from 84 yesterday up to 201 today.  ALT has increased from 61 yesterday up to 157.  Patient continues to deny any abdominal discomfort whatsoever.  She also denies any nausea, vomiting, or diarrhea.  She does report a fever to maximum 101.1 overnight.  Patient is afebrile today.  Exam today reveals bowel sounds positive in all 4 quads, and abdomen nontender with palpation.  Review of patient's past scans; specifically the CT of the abdomen and pelvis with out contrast that was obtained on 08/10/2015 revealed no liver issues.  Patient underwent a colectomy per Dr. Rolm Bookbinder, general surgeon on 08/03/2015.  Reviewed all findings with Dr. Benay Spice; and then spoke directly to Dr. Donne [name] as well regarding all symptoms and findings today.  Dr. Donne [name].  Advised full fever workup which would include blood cultures and urine  cultures.  He advised IV fluid rehydration; and close monitoring.  Patient will return this coming Thursday, 09/24/2015 for repeat labs and visit.  Patient knows to call/return to go directly to the emergency department for any worsening symptoms whatsoever.  Insomnia Patient reports new onset insomnia within the past few weeks.  She states she can fall asleep fine; but then awakes in the middle night and can't go back to sleep.  She's tried no over-the-counter products as of yet.  Long discussion with both patient and her husband regarding options for treatment of insomnia.  Patient will try taking over-the-counter Benadryl to see if this helps with her sleep pattern.  If Benadryl is not helpful; may want to consider low-dose Restoril.  Patient is willing to give herself a trial of Benadryl to see if this helps.  Hypoalbuminemia due to protein-calorie malnutrition ([facility]) Albumin has decreased from 3.4 down to 3.0.  Patient admits to very poor oral intake recently.  Patient was encouraged to push protein in her diet is much as possible.  We'll continue to monitor closely.  Hyperphosphatemia Alkaline phosphatase has significantly increased this week from 189 up to 394.  It is also noted that patient's liver enzymes have greatly increased as well.  Reviewed all findings with both Dr. Benay Spice and general surgeon, Dr. Donne [name].  Will have patient return this coming Thursday, 09/24/2015 for repeat labs in follow-up.  Fever Patient reports fever to maximum 101.1 last night.  Patient states that she has been feeling very fatigued; with generalized weakness within the past week.  She denies any nausea, vomiting, diarrhea, or constipation.  She denies any new pain.  She denies any UTI symptoms.    Patient has a periumbilical surgical wound that is healing with secondary intention.  She states that this was initially a very large wound that she has been changing wet-to-dry dressings on a daily basis.   She states that she's had no recent issues with her abdominal surgical wound.  Liver enzymes have increased within the past few days.  AST has increased from 84 yesterday up to 201 today.  ALT has increased from 61 yesterday up to 157.  Exam today reveals bowel sounds positive in all 4 quads, and abdomen nontender with palpation.  Patients.  Umbilical surgical wound remains open; but appears to be healing well.  There is moist, pink granulation tissue to the site.  There is no foul odor or purulent discharge.  There is  no surrounding edema, erythema, warmth, or tenderness.  See photo.  Review of patient's past scans; specifically the CT of the abdomen and pelvis with out contrast that was obtained on 08/10/2015 revealed no liver issues.  Patient underwent the  colectomy per Dr. Rolm Bookbinder, general surgeon on 08/03/2015.  Reviewed all findings with Dr. Benay Spice; and then spoke directly to Dr. Donne [name] as well regarding all symptoms and findings today.  Vital signs obtained today were essentially within normal limits with the exception of a heart rate elevated to 118.  Patient was afebrile with temperature 96.9.  Patient appears fatigued, weak; but nontoxic.  Blood counts obtained today were within normal limits.  Urinalysis obtained today revealed negative blood, negative nitrites, negative leukocyte esterase, WBCs 3-6, and moderate bacteria.  Both.  Umbilical surgical wound culture and blood cultures pending.  Also, urine culture is pending.  Will treat patient for mild UTI symptoms with Cipro antibiotics.  Dr. Donne [name].  Advised full fever workup which would include blood cultures and urine cultures.  He advised IV fluid rehydration; and close monitoring.  Patient will return this coming Thursday, 09/24/2015 for repeat labs and visit.  Patient knows to call/return to go directly to the emergency department for any worsening symptoms whatsoever.  Dehydration Patient admits to very  poor oral intake recently; and feels dehydrated.  She was seen here at the [facility] yesterday with a sodium of 126.  She feels slightly better today.  Labs obtained today revealed that sodium has improved to 132.  Patient will receive additional 1500 ML's normal saline IV fluid rehydration today.  She was encouraged to push fluids at home as well.  Patient has plans to return for labs and a symptom management clinic visit on 09/24/2015.  She knows to call or return in the interim.  She also knows to go directly to the emergency department for any worsening symptoms whatsoever.  Cancer of right colon [facility]) Patient received cycle one of the oxaliplatin chemotherapy on 09/25/2018 fourth 2017.  She has been taking Xeloda oral therapy as well.  She has been holding the Xeloda oral therapy since this weekend.  Blood counts obtained today were essentially normal.  See further notes regarding dehydration, fever, onset, significant dehydration, and transaminitis.  Patient is scheduled to return this coming Thursday, 09/24/2015 for labs and a visit.  She is also scheduled to return on 09/29/2015 for labs and a follow-up visit as well.  Patient stated understanding of all instructions; and was in agreement with this plan of care. The patient knows to call the clinic with any problems, questions or concerns.   Review/collaboration with Dr. Benay Spice regarding all aspects of patient's visit today.   Total time spent with patient was 40 minutes;  with greater than 75 percent of that time spent in face to face counseling regarding patient's symptoms,  and coordination of care and follow up.  Disclaimer:This dictation was prepared with Dragon/digital dictation along with [employer]. Any transcriptional errors that result from this process are unintentional.  Drue Second, NP 09/22/2015

## 2015-09-22 NOTE — Assessment & Plan Note (Signed)
Patient admits to very poor oral intake recently; and feels dehydrated.  She was seen here at the [facility] yesterday with a sodium of 126.  She feels slightly better today.  Labs obtained today revealed that sodium has improved to 132.  Patient will receive additional 1500 ML's normal saline IV fluid rehydration today.  She was encouraged to push fluids at home as well.  Patient has plans to return for labs and a symptom management clinic visit on 09/24/2015.  She knows to call or return in the interim.  She also knows to go directly to the emergency department for any worsening symptoms whatsoever.

## 2015-09-22 NOTE — Assessment & Plan Note (Signed)
Liver enzymes have increased within the past few days.  AST has increased from 84 yesterday up to 201 today.  ALT has increased from 61 yesterday up to 157.  Patient continues to deny any abdominal discomfort whatsoever.  She also denies any nausea, vomiting, or diarrhea.  She does report a fever to maximum 101.1 overnight.  Patient is afebrile today.  Exam today reveals bowel sounds positive in all 4 quads, and abdomen nontender with palpation.  Review of patient's past scans; specifically the CT of the abdomen and pelvis with out contrast that was obtained on 08/10/2015 revealed no liver issues.  Patient underwent a colectomy per Dr. Rolm Bookbinder, general surgeon on 08/03/2015.  Reviewed all findings with Dr. Benay Spice; and then spoke directly to Dr. Donne [name] as well regarding all symptoms and findings today.  Dr. Donne [name].  Advised full fever workup which would include blood cultures and urine cultures.  He advised IV fluid rehydration; and close monitoring.  Patient will return this coming Thursday, 09/24/2015 for repeat labs and visit.  Patient knows to call/return to go directly to the emergency department for any worsening symptoms whatsoever.

## 2015-09-22 NOTE — Assessment & Plan Note (Signed)
Patient reports new onset insomnia within the past few weeks.  She states she can fall asleep fine; but then awakes in the middle night and can't go back to sleep.  She's tried no over-the-counter products as of yet.  Long discussion with both patient and her husband regarding options for treatment of insomnia.  Patient will try taking over-the-counter Benadryl to see if this helps with her sleep pattern.  If Benadryl is not helpful; may want to consider low-dose Restoril.  Patient is willing to give herself a trial of Benadryl to see if this helps.

## 2015-09-22 NOTE — Telephone Encounter (Signed)
per pof to sch pt appt-pt to get updated copy b4 leaving trmtroom °

## 2015-09-24 ENCOUNTER — Ambulatory Visit (HOSPITAL_BASED_OUTPATIENT_CLINIC_OR_DEPARTMENT_OTHER): Payer: Managed Care, Other (non HMO) | Admitting: Nurse Practitioner

## 2015-09-24 ENCOUNTER — Other Ambulatory Visit (HOSPITAL_BASED_OUTPATIENT_CLINIC_OR_DEPARTMENT_OTHER): Payer: Managed Care, Other (non HMO)

## 2015-09-24 VITALS — BP 108/73 | HR 121 | Temp 98.6°F | Resp 18 | Ht 61.0 in | Wt 134.1 lb

## 2015-09-24 DIAGNOSIS — J069 Acute upper respiratory infection, unspecified: Secondary | ICD-10-CM | POA: Diagnosis not present

## 2015-09-24 DIAGNOSIS — R7401 Elevation of levels of liver transaminase levels: Secondary | ICD-10-CM

## 2015-09-24 DIAGNOSIS — E538 Deficiency of other specified B group vitamins: Secondary | ICD-10-CM

## 2015-09-24 DIAGNOSIS — R509 Fever, unspecified: Secondary | ICD-10-CM | POA: Diagnosis not present

## 2015-09-24 DIAGNOSIS — R740 Nonspecific elevation of levels of transaminase and lactic acid dehydrogenase [LDH]: Secondary | ICD-10-CM

## 2015-09-24 DIAGNOSIS — C182 Malignant neoplasm of ascending colon: Secondary | ICD-10-CM | POA: Diagnosis not present

## 2015-09-24 DIAGNOSIS — C189 Malignant neoplasm of colon, unspecified: Secondary | ICD-10-CM

## 2015-09-24 LAB — COMPREHENSIVE METABOLIC PANEL
ALT: 66 U/L — AB (ref 0–55)
AST: 55 U/L — AB (ref 5–34)
Albumin: 2.9 g/dL — ABNORMAL LOW (ref 3.5–5.0)
Alkaline Phosphatase: 288 U/L — ABNORMAL HIGH (ref 40–150)
Anion Gap: 11 mEq/L (ref 3–11)
BUN: 5.4 mg/dL — AB (ref 7.0–26.0)
CALCIUM: 8.5 mg/dL (ref 8.4–10.4)
CHLORIDE: 100 meq/L (ref 98–109)
CO2: 21 meq/L — AB (ref 22–29)
CREATININE: 0.7 mg/dL (ref 0.6–1.1)
EGFR: >90 mL/min/{1.73_m2} (ref >90)
Glucose: 98 mg/dl (ref 70–140)
Potassium: 2.7 mEq/L — CL (ref 3.5–5.1)
Sodium: 132 mEq/L — ABNORMAL LOW (ref 136–145)
Total Bilirubin: 0.3 mg/dL (ref 0.20–1.20)
Total Protein: 6.4 g/dL (ref 6.4–8.3)

## 2015-09-24 LAB — CBC WITH DIFFERENTIAL/PLATELET
BASO%: 0.4 % (ref 0.0–2.0)
BASOS ABS: 0 10*3/uL (ref 0.0–0.1)
EOS ABS: 0.4 10*3/uL (ref 0.0–0.5)
EOS%: 7.6 % — ABNORMAL HIGH (ref 0.0–7.0)
HEMATOCRIT: 25.7 % — AB (ref 34.8–46.6)
HGB: 8.4 g/dL — ABNORMAL LOW (ref 11.6–15.9)
LYMPH#: 0.5 10*3/uL — AB (ref 0.9–3.3)
LYMPH%: 10.3 % — AB (ref 14.0–49.7)
MCH: 23.5 pg — ABNORMAL LOW (ref 25.1–34.0)
MCHC: 32.7 g/dL (ref 31.5–36.0)
MCV: 71.8 fL — AB (ref 79.5–101.0)
MONO#: 0.3 10*3/uL (ref 0.1–0.9)
MONO%: 7 % (ref 0.0–14.0)
NEUT#: 3.5 10*3/uL (ref 1.5–6.5)
NEUT%: 74.7 % (ref 38.4–76.8)
PLATELETS: 199 10*3/uL (ref 145–400)
RBC: 3.58 10*6/uL — AB (ref 3.70–5.45)
RDW: 15.7 % — ABNORMAL HIGH (ref 11.2–14.5)
WBC: 4.7 10*3/uL (ref 3.9–10.3)

## 2015-09-24 LAB — URINE CULTURE

## 2015-09-24 LAB — WOUND CULTURE

## 2015-09-24 MED ORDER — TRAMADOL HCL 50 MG PO TABS: 50.0000 mg | ORAL_TABLET | Freq: Four times a day (QID) | ORAL | Status: DC | PRN

## 2015-09-24 MED ORDER — POTASSIUM CHLORIDE CRYS ER 20 MEQ PO TBCR: EXTENDED_RELEASE_TABLET | ORAL | Status: DC

## 2015-09-24 MED FILL — KLOR-CON M20 TABLET: 20 | 6 days supply | Qty: 14 | Fill #0

## 2015-09-24 MED FILL — traMADol HCL 50 MG TABS: 50 | 6 days supply | Qty: 45 | Fill #0 | Status: TO

## 2015-09-25 LAB — VITAMIN B12: Vitamin B12: 614 pg/mL (ref 211–946)

## 2015-09-26 ENCOUNTER — Encounter: Payer: Self-pay | Admitting: Nurse Practitioner

## 2015-09-26 DIAGNOSIS — J069 Acute upper respiratory infection, unspecified: Secondary | ICD-10-CM | POA: Insufficient documentation

## 2015-09-26 NOTE — Assessment & Plan Note (Signed)
Patient states that she has developed some mild nasal congestion; but denies any sore throat or productive cough.  She also denies any fevers within the past 48 hours.  Patient was noted to have some mild nasal congestion; but appeared nontoxic.  Breath sounds were clear bilaterally.  Patient appeared to feel much better today, then just a few days ago.  During previous exam.  Patient was advised to continue taking Cipro as previously directed.  She was also encouraged to try over-the-counter Mucinex as well.

## 2015-09-26 NOTE — Assessment & Plan Note (Signed)
Liver enzymes have increased within the past few days.  AST has increased from 84 yesterday up to 201 today.  ALT has increased from 61 yesterday up to 157.  Patient continues to deny any abdominal discomfort whatsoever.  She also denies any nausea, vomiting, or diarrhea.  She does report a fever to maximum 101.1 overnight.  Patient is afebrile today.  Exam today reveals bowel sounds positive in all 4 quads, and abdomen nontender with palpation.  Review of patient's past scans; specifically the CT of the abdomen and pelvis with out contrast that was obtained on 08/10/2015 revealed no liver issues.  Patient underwent a colectomy per Dr. Rolm Bookbinder, general surgeon on 08/03/2015.  Reviewed all findings with Dr. Benay Spice; and then spoke directly to Dr. Donne [name] as well regarding all symptoms and findings today.  Dr. Donne [name].  Advised full fever workup which would include blood cultures and urine cultures.  He advised IV fluid rehydration; and close monitoring.  Patient will return this coming Thursday, 09/24/2015 for repeat labs and visit.  Patient knows to call/return to go directly to the emergency department for any worsening symptoms whatsoever. _________________________________________  Update: Liver enzymes have almost completely normalized; with AST now down to 55 and ALT down to 66.  Will continue to monitor closely.

## 2015-09-26 NOTE — Assessment & Plan Note (Signed)
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  She has been taking Xeloda oral therapy as well.  She has been holding the Xeloda oral therapy since this weekend.  Blood counts obtained today were essentially normal.  See further notes regarding dehydration, fever, onset, significant dehydration, and transaminitis.  Patient is scheduled to return this coming Thursday, 09/24/2015 for labs and a visit.  She is also scheduled to return on 09/29/2015 for labs and a follow-up visit as well. _____________________________________________  Update: Patient states that she is feeling much better today.  She does have a mild upper respiratory infection; with nasal congestion only.  She continues taking Cipro as directed.  Patient is scheduled to return on 09/29/2015 for labs and a follow-up visit.

## 2015-09-26 NOTE — Assessment & Plan Note (Signed)
Patient reports fever to maximum 101.1 last night.  Patient states that she has been feeling very fatigued; with generalized weakness within the past week.  She denies any nausea, vomiting, diarrhea, or constipation.  She denies any new pain.  She denies any UTI symptoms.    Patient has a periumbilical surgical wound that is healing with secondary intention.  She states that this was initially a very large wound that she has been changing wet-to-dry dressings on a daily basis.  She states that she's had no recent issues with her abdominal surgical wound.  Liver enzymes have increased within the past few days.  AST has increased from 84 yesterday up to 201 today.  ALT has increased from 61 yesterday up to 157.  Exam today reveals bowel sounds positive in all 4 quads, and abdomen nontender with palpation.  Patients.  Umbilical surgical wound remains open; but appears to be healing well.  There is moist, pink granulation tissue to the site.  There is no foul odor or purulent discharge.  There is no surrounding edema, erythema, warmth, or tenderness.  See photo.  Review of patient's past scans; specifically the CT of the abdomen and pelvis with out contrast that was obtained on 08/10/2015 revealed no liver issues.  Patient underwent the  colectomy per Dr. Rolm Bookbinder, general surgeon on 08/03/2015.  Reviewed all findings with Dr. Benay Spice; and then spoke directly to Dr. Donne [name] as well regarding all symptoms and findings today.  Vital signs obtained today were essentially within normal limits with the exception of a heart rate elevated to 118.  Patient was afebrile with temperature 96.9.  Patient appears fatigued, weak; but nontoxic.  Blood counts obtained today were within normal limits.  Urinalysis obtained today revealed negative blood, negative nitrites, negative leukocyte esterase, WBCs 3-6, and moderate bacteria.  Both.  Umbilical surgical wound culture and blood cultures pending.  Also,  urine culture is pending.  Will treat patient for mild UTI symptoms with Cipro antibiotics.  Dr. Donne [name].  Advised full fever workup which would include blood cultures and urine cultures.  He advised IV fluid rehydration; and close monitoring.  Patient will return this coming Thursday, 09/24/2015 for repeat labs and visit.  Patient knows to call/return to go directly to the emergency department for any worsening symptoms whatsoever. _______________________________________________________  Update: Patient continues to take the Cipro as directed for mild UTI symptoms.  She states that she has no UTI symptoms whatsoever now; and continues to deny any other issues.  She has had no further fevers.  Vital signs are stable and patient afebrile today.  Preliminary blood cultures are negative so far; and urine culture was negative as well.  Wound culture was also negative.  Patient was advised to continue taking the Cipro as prescribed directed.  She was encouraged to call/return if her directly to the emergency department for any worsening symptoms whatsoever.

## 2015-09-26 NOTE — Progress Notes (Signed)
SYMPTOM MANAGEMENT CLINIC   HPI: [name] 50 y.o. female diagnosed with colon cancer.  Patient is status post colectomy on 08/03/2015 per Dr. Rolm Bookbinder.  Patient is currently undergoing oxaliplatin chemotherapy regimen, and Xeloda oral therapy.   Patient reports fever to maximum 101.1 last night.  Patient states that she has been feeling very fatigued; with generalized weakness within the past week.  She denies any nausea, vomiting, diarrhea, or constipation.  She denies any new pain.  She denies any UTI symptoms.    Patient has a periumbilical surgical wound that is healing with secondary intention.  She states that this was initially a very large wound that she has been changing wet-to-dry dressings on a daily basis.  She states that she's had no recent issues with her abdominal surgical wound.  Liver enzymes have increased within the past few days.  AST has increased from 84 yesterday up to 201 today.  ALT has increased from 61 yesterday up to 157.  Exam today reveals bowel sounds positive in all 4 quads, and abdomen nontender with palpation.  Patients.  Umbilical surgical wound remains open; but appears to be healing well.  There is moist, pink granulation tissue to the site.  There is no foul odor or purulent discharge.  There is no surrounding edema, erythema, warmth, or tenderness.  See photo.  Review of patient's past scans; specifically the CT of the abdomen and pelvis with out contrast that was obtained on 08/10/2015 revealed no liver issues.  Patient underwent the  colectomy per Dr. Rolm Bookbinder, general surgeon on 08/03/2015.  Reviewed all findings with Dr. Benay Spice; and then spoke directly to Dr. Donne [name] as well regarding all symptoms and findings today.  Vital signs obtained today were essentially within normal limits with the exception of a heart rate elevated to 118.  Patient was afebrile with temperature 96.9.  Patient appears fatigued, weak; but  nontoxic.  Blood counts obtained today were within normal limits.  Urinalysis obtained today revealed negative blood, negative nitrites, negative leukocyte esterase, WBCs 3-6, and moderate bacteria.  Both.  Umbilical surgical wound culture and blood cultures pending.  Also, urine culture is pending.  Will treat patient for mild UTI symptoms with Cipro antibiotics.  Dr. Donne [name].  Advised full fever workup which would include blood cultures and urine cultures.  He advised IV fluid rehydration; and close monitoring.  Patient will return this coming Thursday, 09/24/2015 for repeat labs and visit.  Patient knows to call/return to go directly to the emergency department for any worsening symptoms whatsoever.  ____________________________________  Update: Patient in for recheck.  Patient states that she has developed a mild cold; but has no severe symptoms.  She also denies any recent fevers or chills.  She has been taking the Cipro as directed.  She states this feels much better today than she did earlier this past week.  Return exam.  URI  Associated symptoms include congestion.    Review of Systems  Constitutional: Positive for malaise/fatigue. Negative for fever and chills.  HENT: Positive for congestion.   All other systems reviewed and are negative.   Past Medical History  Diagnosis Date  . Anemia   . Depression   . Anxiety   . JP drain bleeding   . GERD (gastroesophageal reflux disease)   . Fibromyalgia     Past Surgical History  Procedure Laterality Date  . Abdominal hysterectomy    . Gastric bypass    . Colon surgery  08/03/2015  .  Laparoscopic ileocecectomy Right 08/03/2015    Procedure: LAPAROSCOPIC DIAGNOSTIC RIGHT COLECTOMY;  Surgeon: Rolm Bookbinder, MD;  Location: [facility];  Service: General;  Laterality: Right;  . Colostomy revision N/A 08/03/2015    Procedure: COLON RESECTION RIGHT;  Surgeon: Rolm Bookbinder, MD;  Location: [facility];  Service: General;  Laterality:  N/A;    has Perforated appendicitis; Appendiceal abscess; Appendicitis, acute; S/P colectomy; Cancer of right colon ([place]); Anemia, iron deficiency; Dehydration; Hyponatremia; Hypotension; Transaminitis; Hyperphosphatemia; Hypoalbuminemia due to protein-calorie malnutrition ([name]); Insomnia; Fever; and URI (upper respiratory infection) on her problem list.    is allergic to latex; cymbalta; and penicillins.    Medication List       This list is accurate as of: 09/24/15 11:59 PM.  Always use your most recent med list.               acetaminophen 500 MG tablet  Commonly known as:  TYLENOL  Take 1,000 mg by mouth every 6 (six) hours as needed for fever. Reported on 09/24/2015     capecitabine 500 MG tablet  Commonly known as:  XELODA  Take 3 tablets (1,500 mg total) by mouth 2 (two) times daily after a meal. Take on days 1-14 of chemotherapy.     ciprofloxacin 500 MG tablet  Commonly known as:  CIPRO  Take 1 tablet (500 mg total) by mouth 2 (two) times daily.     cyclobenzaprine 10 MG tablet  Commonly known as:  FLEXERIL  Take 1 tablet (10 mg total) by mouth 3 (three) times daily as needed for muscle spasms.     dexamethasone 4 MG tablet  Commonly known as:  DECADRON  Take 2 tablets (8 mg total) by mouth daily. Start day after chemo and take for 3 days after each IV chemo     diphenhydrAMINE 25 mg capsule  Commonly known as:  BENADRYL  Take 25 mg by mouth every 6 (six) hours as needed for sleep.     escitalopram 10 MG tablet  Commonly known as:  LEXAPRO  Take 10 mg by mouth daily.     ferrous sulfate 325 (65 FE) MG tablet  Take 325 mg by mouth 2 (two) times daily with a meal. Reported on 09/24/2015     ondansetron 8 MG tablet  Commonly known as:  ZOFRAN  Take 1 tablet (8 mg total) by mouth as directed. Take twice daily as needed for nausea-start on day 3 of chemotherapy     Oxycodone HCl 10 MG Tabs  Take 1 tablet (10 mg total) by mouth every 4 (four) hours as needed for  moderate pain.     potassium chloride SA 20 MEQ tablet  Commonly known as:  K-DUR,KLOR-CON  Take 1 tab PO TID x 1 day; then take 1 tab PO BID till gone.     prochlorperazine 10 MG tablet  Commonly known as:  COMPAZINE  Take 1 tablet (10 mg total) by mouth every 6 (six) hours as needed for nausea or vomiting.     sodium chloride irrigation 0.9 % irrigation  Reported on 09/24/2015     traMADol 50 MG tablet  Commonly known as:  ULTRAM  Take 1-2 tablets (50-100 mg total) by mouth every 6 (six) hours as needed.     vitamin B-12 1000 MCG tablet  Commonly known as:  CYANOCOBALAMIN  Take 500 mcg by mouth daily. Reported on 09/24/2015     vitamin C 500 MG tablet  Commonly known as:  ASCORBIC ACID  Take 500 mg by mouth daily.         PHYSICAL EXAMINATION  Oncology Vitals 09/24/2015 09/22/2015  Height 155 cm 155 cm  Weight 60.827 kg 58.786 kg  Weight (lbs) 134 lbs 2 oz 129 lbs 10 oz  BMI (kg/m2) 25.34 kg/m2 24.49 kg/m2  Temp 98.6 96.9  Pulse 121 118  Resp 18 18  SpO2 98 100  BSA (m2) 1.62 m2 1.59 m2   BP Readings from Last 2 Encounters:  09/24/15 108/73  09/22/15 110/76    Physical Exam  Constitutional: She is oriented to person, place, and time. Vital signs are normal. She appears malnourished. She appears unhealthy. She appears cachectic.  HENT:  Head: Normocephalic and atraumatic.  Mouth/Throat: Oropharynx is clear and moist.  Mild nasal congestion only.  Eyes: Conjunctivae and EOM are normal. Pupils are equal, round, and reactive to light. Right eye exhibits no discharge. Left eye exhibits no discharge. No scleral icterus.  Neck: Normal range of motion. Neck supple. No JVD present. No tracheal deviation present. No thyromegaly present.  Cardiovascular: Regular rhythm, normal heart sounds and intact distal pulses.   Tachycardic  Pulmonary/Chest: Effort normal and breath sounds normal. No respiratory distress. She has no wheezes. She has no rales. She exhibits no tenderness.    Abdominal: Soft. Bowel sounds are normal. She exhibits no distension and no mass. There is no tenderness. There is no rebound and no guarding.  Musculoskeletal: Normal range of motion. She exhibits no edema or tenderness.  Lymphadenopathy:    She has no cervical adenopathy.  Neurological: She is alert and oriented to person, place, and time. Gait normal.  Skin: Skin is warm and dry. No rash noted. No erythema. There is pallor.  Patient has a resolving open.  Umbilical surgical wound with pink granulation tissue.  There is no evidence of infection to site.  Psychiatric: Affect normal.    LABORATORY DATA:. Appointment on 09/24/2015  Component Date Value Ref Range Status  . WBC 09/24/2015 4.7  3.9 - 10.3 10e3/uL Final  . NEUT# 09/24/2015 3.5  1.5 - 6.5 10e3/uL Final  . HGB 09/24/2015 8.4* 11.6 - 15.9 g/dL Final  . HCT 09/24/2015 25.7* 34.8 - 46.6 % Final  . Platelets 09/24/2015 199  145 - 400 10e3/uL Final  . MCV 09/24/2015 71.8* 79.5 - 101.0 fL Final  . MCH 09/24/2015 23.5* 25.1 - 34.0 pg Final  . MCHC 09/24/2015 32.7  31.5 - 36.0 g/dL Final  . RBC 09/24/2015 3.58* 3.70 - 5.45 10e6/uL Final  . RDW 09/24/2015 15.7* 11.2 - 14.5 % Final  . lymph# 09/24/2015 0.5* 0.9 - 3.3 10e3/uL Final  . MONO# 09/24/2015 0.3  0.1 - 0.9 10e3/uL Final  . Eosinophils Absolute 09/24/2015 0.4  0.0 - 0.5 10e3/uL Final  . Basophils Absolute 09/24/2015 0.0  0.0 - 0.1 10e3/uL Final  . NEUT% 09/24/2015 74.7  38.4 - 76.8 % Final  . LYMPH% 09/24/2015 10.3* 14.0 - 49.7 % Final  . MONO% 09/24/2015 7.0  0.0 - 14.0 % Final  . EOS% 09/24/2015 7.6* 0.0 - 7.0 % Final  . BASO% 09/24/2015 0.4  0.0 - 2.0 % Final  . Sodium 09/24/2015 132* 136 - 145 mEq/L Final  . Potassium 09/24/2015 2.7 Repeated and Verified* 3.5 - 5.1 mEq/L Final  . Chloride 09/24/2015 100  98 - 109 mEq/L Final  . CO2 09/24/2015 21* 22 - 29 mEq/L Final  . Glucose 09/24/2015 98  70 - 140 mg/dl Final   Glucose reference range  is for nonfasting patients.  Fasting glucose reference range is 70- 100.  Marland Kitchen BUN 09/24/2015 5.4* 7.0 - 26.0 mg/dL Final  . Creatinine 09/24/2015 0.7  0.6 - 1.1 mg/dL Final  . Total Bilirubin 09/24/2015 0.30  0.20 - 1.20 mg/dL Final  . Alkaline Phosphatase 09/24/2015 288* 40 - 150 U/L Final  . AST 09/24/2015 55* 5 - 34 U/L Final  . ALT 09/24/2015 66* 0 - 55 U/L Final  . Total Protein 09/24/2015 6.4  6.4 - 8.3 g/dL Final  . Albumin 09/24/2015 2.9* 3.5 - 5.0 g/dL Final  . Calcium 09/24/2015 8.5  8.4 - 10.4 mg/dL Final  . Anion Gap 09/24/2015 11  3 - 11 mEq/L Final  . EGFR 09/24/2015 >90  >90 ml/min/1.73 m2 Final   eGFR is calculated using the CKD-EPI Creatinine Equation (2009)  . Vitamin B12 09/24/2015 614  211 - 946 pg/mL Final  Appointment on 09/22/2015  Component Date Value Ref Range Status  . Glucose 09/22/2015 Negative  Negative mg/dL Final  . Bilirubin (Urine) 09/22/2015 Negative  Negative Final  . Ketones 09/22/2015 80  Negative mg/dL Final  . Specific Gravity, Urine 09/22/2015 1.030  1.003 - 1.035 Final  . Blood 09/22/2015 Negative  Negative Final  . pH 09/22/2015 6.0  4.6 - 8.0 Final  . Protein 09/22/2015 30  Negative- <30 mg/dL Final  . Urobilinogen, UR 09/22/2015 0.2  0.2 - 1 mg/dL Final  . Nitrite 09/22/2015 Negative  Negative Final  . Leukocyte Esterase 09/22/2015 Negative  Negative Final  . RBC / HPF 09/22/2015 0-2  0 - 2 Final  . WBC, UA 09/22/2015 3-6  0 - 2 Final  . Bacteria, UA 09/22/2015 Moderate  Negative- Trace Final  . Casts 09/22/2015 Granular and Hyaline  Negative Final  . Epithelial Cells 09/22/2015 Few  Negative- Few Final  . Mucus, UA 09/22/2015 Small  Negative- Small Final  . Urine Culture, Routine 09/22/2015 Final report   Final  . Urine Culture result 1 09/22/2015 Comment   Final   Comment: Mixed urogenital flora Less than 10,000 colonies/mL   . BLOOD CULTURE, ROUTINE 09/22/2015 Preliminary report   Preliminary  . RESULT 1 09/22/2015 Comment   Preliminary   No growth in 36 - 48  hours.  Marland Kitchen BLOOD CULTURE, ROUTINE 09/22/2015 Preliminary report   Preliminary  . RESULT 1 09/22/2015 Comment   Preliminary   No growth in 36 - 48 hours.  Appointment on 09/22/2015  Component Date Value Ref Range Status  . Sodium 09/22/2015 132* 136 - 145 mEq/L Final  . Potassium 09/22/2015 4.0  3.5 - 5.1 mEq/L Final  . Chloride 09/22/2015 101  98 - 109 mEq/L Final  . CO2 09/22/2015 19* 22 - 29 mEq/L Final  . Glucose 09/22/2015 165* 70 - 140 mg/dl Final   Glucose reference range is for nonfasting patients. Fasting glucose reference range is 70- 100.  Marland Kitchen BUN 09/22/2015 10.0  7.0 - 26.0 mg/dL Final  . Creatinine 09/22/2015 0.7  0.6 - 1.1 mg/dL Final  . Total Bilirubin 09/22/2015 0.42  0.20 - 1.20 mg/dL Final  . Alkaline Phosphatase 09/22/2015 394* 40 - 150 U/L Final  . AST 09/22/2015 201* 5 - 34 U/L Final  . ALT 09/22/2015 157* 0 - 55 U/L Final  . Total Protein 09/22/2015 7.4  6.4 - 8.3 g/dL Final  . Albumin 09/22/2015 3.0* 3.5 - 5.0 g/dL Final  . Calcium 09/22/2015 9.6  8.4 - 10.4 mg/dL Final  . Anion Gap 09/22/2015 12* 3 -  11 mEq/L Final  . EGFR 09/22/2015 >90  >90 ml/min/1.73 m2 Final   eGFR is calculated using the CKD-EPI Creatinine Equation (2009)  . WBC 09/22/2015 1.8* 3.9 - 10.3 10e3/uL Final  . NEUT# 09/22/2015 1.5  1.5 - 6.5 10e3/uL Final  . HGB 09/22/2015 9.8* 11.6 - 15.9 g/dL Final  . HCT 09/22/2015 30.9* 34.8 - 46.6 % Final  . Platelets 09/22/2015 214  145 - 400 10e3/uL Final  . MCV 09/22/2015 72.0* 79.5 - 101.0 fL Final  . MCH 09/22/2015 22.8* 25.1 - 34.0 pg Final  . MCHC 09/22/2015 31.7  31.5 - 36.0 g/dL Final  . RBC 09/22/2015 4.29  3.70 - 5.45 10e6/uL Final  . RDW 09/22/2015 15.6* 11.2 - 14.5 % Final  . lymph# 09/22/2015 0.2* 0.9 - 3.3 10e3/uL Final  . MONO# 09/22/2015 0.1  0.1 - 0.9 10e3/uL Final  . Eosinophils Absolute 09/22/2015 0.0  0.0 - 0.5 10e3/uL Final  . Basophils Absolute 09/22/2015 0.0  0.0 - 0.1 10e3/uL Final  . NEUT% 09/22/2015 83.5* 38.4 - 76.8 % Final    . LYMPH% 09/22/2015 11.9* 14.0 - 49.7 % Final  . MONO% 09/22/2015 4.0  0.0 - 14.0 % Final  . EOS% 09/22/2015 0.6  0.0 - 7.0 % Final  . BASO% 09/22/2015 0.0  0.0 - 2.0 % Final  . Gram Stain Result 09/22/2015 Final report   Final  . Result 1 09/22/2015 Comment   Final   Rare white blood cells.  . RESULT 2 09/22/2015 Comment   Final   Few gram positive cocci  . Aerobic Bacterial Culture 09/22/2015 Final report   Final  . Result 1 09/22/2015 Routine flora   Final   Heavy growth       RADIOGRAPHIC STUDIES: No results found.  ASSESSMENT/PLAN:    Transaminitis Liver enzymes have increased within the past few days.  AST has increased from 84 yesterday up to 201 today.  ALT has increased from 61 yesterday up to 157.  Patient continues to deny any abdominal discomfort whatsoever.  She also denies any nausea, vomiting, or diarrhea.  She does report a fever to maximum 101.1 overnight.  Patient is afebrile today.  Exam today reveals bowel sounds positive in all 4 quads, and abdomen nontender with palpation.  Review of patient's past scans; specifically the CT of the abdomen and pelvis with out contrast that was obtained on 08/10/2015 revealed no liver issues.  Patient underwent a colectomy per Dr. Rolm Bookbinder, general surgeon on 08/03/2015.  Reviewed all findings with Dr. Benay Spice; and then spoke directly to Dr. Donne [name] as well regarding all symptoms and findings today.  Dr. Donne [name].  Advised full fever workup which would include blood cultures and urine cultures.  He advised IV fluid rehydration; and close monitoring.  Patient will return this coming Thursday, 09/24/2015 for repeat labs and visit.  Patient knows to call/return to go directly to the emergency department for any worsening symptoms whatsoever. _________________________________________  Update: Liver enzymes have almost completely normalized; with AST now down to 55 and ALT down to 66.  Will continue to monitor  closely.    Fever Patient reports fever to maximum 101.1 last night.  Patient states that she has been feeling very fatigued; with generalized weakness within the past week.  She denies any nausea, vomiting, diarrhea, or constipation.  She denies any new pain.  She denies any UTI symptoms.    Patient has a periumbilical surgical wound that is healing with secondary intention.  She  states that this was initially a very large wound that she has been changing wet-to-dry dressings on a daily basis.  She states that she's had no recent issues with her abdominal surgical wound.  Liver enzymes have increased within the past few days.  AST has increased from 84 yesterday up to 201 today.  ALT has increased from 61 yesterday up to 157.  Exam today reveals bowel sounds positive in all 4 quads, and abdomen nontender with palpation.  Patients.  Umbilical surgical wound remains open; but appears to be healing well.  There is moist, pink granulation tissue to the site.  There is no foul odor or purulent discharge.  There is no surrounding edema, erythema, warmth, or tenderness.  See photo.  Review of patient's past scans; specifically the CT of the abdomen and pelvis with out contrast that was obtained on 08/10/2015 revealed no liver issues.  Patient underwent the  colectomy per Dr. Rolm Bookbinder, general surgeon on 08/03/2015.  Reviewed all findings with Dr. Benay Spice; and then spoke directly to Dr. Donne [name] as well regarding all symptoms and findings today.  Vital signs obtained today were essentially within normal limits with the exception of a heart rate elevated to 118.  Patient was afebrile with temperature 96.9.  Patient appears fatigued, weak; but nontoxic.  Blood counts obtained today were within normal limits.  Urinalysis obtained today revealed negative blood, negative nitrites, negative leukocyte esterase, WBCs 3-6, and moderate bacteria.  Both.  Umbilical surgical wound culture and blood  cultures pending.  Also, urine culture is pending.  Will treat patient for mild UTI symptoms with Cipro antibiotics.  Dr. Donne [name].  Advised full fever workup which would include blood cultures and urine cultures.  He advised IV fluid rehydration; and close monitoring.  Patient will return this coming Thursday, 09/24/2015 for repeat labs and visit.  Patient knows to call/return to go directly to the emergency department for any worsening symptoms whatsoever. _______________________________________________________  Update: Patient continues to take the Cipro as directed for mild UTI symptoms.  She states that she has no UTI symptoms whatsoever now; and continues to deny any other issues.  She has had no further fevers.  Vital signs are stable and patient afebrile today.  Preliminary blood cultures are negative so far; and urine culture was negative as well.  Wound culture was also negative.  Patient was advised to continue taking the Cipro as prescribed directed.  She was encouraged to call/return if her directly to the emergency department for any worsening symptoms whatsoever.    Cancer of right colon [facility]) Patient received cycle one of the oxaliplatin chemotherapy on 09/25/2018 fourth 2017.  She has been taking Xeloda oral therapy as well.  She has been holding the Xeloda oral therapy since this weekend.  Blood counts obtained today were essentially normal.  See further notes regarding dehydration, fever, onset, significant dehydration, and transaminitis.  Patient is scheduled to return this coming Thursday, 09/24/2015 for labs and a visit.  She is also scheduled to return on 09/29/2015 for labs and a follow-up visit as well. _____________________________________________  Update: Patient states that she is feeling much better today.  She does have a mild upper respiratory infection; with nasal congestion only.  She continues taking Cipro as directed.  Patient is scheduled to  return on 09/29/2015 for labs and a follow-up visit.   URI (upper respiratory infection) Patient states that she has developed some mild nasal congestion; but denies any sore throat or productive cough.  She  also denies any fevers within the past 48 hours.  Patient was noted to have some mild nasal congestion; but appeared nontoxic.  Breath sounds were clear bilaterally.  Patient appeared to feel much better today, then just a few days ago.  During previous exam.  Patient was advised to continue taking Cipro as previously directed.  She was also encouraged to try over-the-counter Mucinex as well.   Patient stated understanding of all instructions; and was in agreement with this plan of care. The patient knows to call the clinic with any problems, questions or concerns.   All details of today's visit reviewed with Dr. Burr Medico.   Total time spent with patient was 25 minutes;  with greater than 75 percent of that time spent in face to face counseling regarding patient's symptoms,  and coordination of care and follow up.  Disclaimer:This dictation was prepared with Dragon/digital dictation along with [employer]. Any transcriptional errors that result from this process are unintentional.  Drue Second, NP 09/26/2015

## 2015-09-28 ENCOUNTER — Telehealth: Payer: Self-pay | Admitting: Hematology

## 2015-09-28 ENCOUNTER — Other Ambulatory Visit: Payer: Self-pay | Admitting: Hematology

## 2015-09-28 LAB — CULTURE, BLOOD (SINGLE)

## 2015-09-28 NOTE — Telephone Encounter (Signed)
Moved 3/7 lab/fu to 11:30 am and 12 pm per 3/6 pof. Per pof patient aware of time change.

## 2015-09-29 ENCOUNTER — Encounter: Payer: Self-pay | Admitting: Hematology

## 2015-09-29 ENCOUNTER — Other Ambulatory Visit (HOSPITAL_BASED_OUTPATIENT_CLINIC_OR_DEPARTMENT_OTHER): Payer: Managed Care, Other (non HMO)

## 2015-09-29 ENCOUNTER — Ambulatory Visit (HOSPITAL_BASED_OUTPATIENT_CLINIC_OR_DEPARTMENT_OTHER): Payer: Managed Care, Other (non HMO) | Admitting: Hematology

## 2015-09-29 ENCOUNTER — Telehealth: Payer: Self-pay | Admitting: Hematology

## 2015-09-29 VITALS — BP 94/59 | HR 115 | Temp 98.9°F | Resp 18 | Ht 61.0 in | Wt 133.3 lb

## 2015-09-29 DIAGNOSIS — D509 Iron deficiency anemia, unspecified: Secondary | ICD-10-CM

## 2015-09-29 DIAGNOSIS — C182 Malignant neoplasm of ascending colon: Secondary | ICD-10-CM

## 2015-09-29 DIAGNOSIS — C180 Malignant neoplasm of cecum: Secondary | ICD-10-CM

## 2015-09-29 DIAGNOSIS — M791 Myalgia: Secondary | ICD-10-CM | POA: Diagnosis not present

## 2015-09-29 DIAGNOSIS — R630 Anorexia: Secondary | ICD-10-CM

## 2015-09-29 DIAGNOSIS — R530 Neoplastic (malignant) related fatigue: Secondary | ICD-10-CM | POA: Diagnosis not present

## 2015-09-29 DIAGNOSIS — F329 Major depressive disorder, single episode, unspecified: Secondary | ICD-10-CM

## 2015-09-29 LAB — COMPREHENSIVE METABOLIC PANEL
ALBUMIN: 2.6 g/dL — AB (ref 3.5–5.0)
ALK PHOS: 315 U/L — AB (ref 40–150)
ALT: 54 U/L (ref 0–55)
ANION GAP: 9 meq/L (ref 3–11)
AST: 47 U/L — ABNORMAL HIGH (ref 5–34)
BUN: 8 mg/dL (ref 7.0–26.0)
CALCIUM: 8.4 mg/dL (ref 8.4–10.4)
CO2: 24 mEq/L (ref 22–29)
Chloride: 100 mEq/L (ref 98–109)
Creatinine: 0.6 mg/dL (ref 0.6–1.1)
Glucose: 117 mg/dl (ref 70–140)
POTASSIUM: 3.7 meq/L (ref 3.5–5.1)
SODIUM: 132 meq/L — AB (ref 136–145)
Total Bilirubin: 0.33 mg/dL (ref 0.20–1.20)
Total Protein: 5.9 g/dL — ABNORMAL LOW (ref 6.4–8.3)

## 2015-09-29 LAB — CBC WITH DIFFERENTIAL/PLATELET
BASO%: 0.2 % (ref 0.0–2.0)
BASOS ABS: 0 10*3/uL (ref 0.0–0.1)
EOS ABS: 0.3 10*3/uL (ref 0.0–0.5)
EOS%: 3.3 % (ref 0.0–7.0)
HEMATOCRIT: 25.9 % — AB (ref 34.8–46.6)
HEMOGLOBIN: 8.2 g/dL — AB (ref 11.6–15.9)
LYMPH%: 9.4 % — AB (ref 14.0–49.7)
MCH: 22.8 pg — AB (ref 25.1–34.0)
MCHC: 31.7 g/dL (ref 31.5–36.0)
MCV: 72.1 fL — AB (ref 79.5–101.0)
MONO#: 0.7 10*3/uL (ref 0.1–0.9)
MONO%: 8.6 % (ref 0.0–14.0)
NEUT#: 6.6 10*3/uL — ABNORMAL HIGH (ref 1.5–6.5)
NEUT%: 78.5 % — ABNORMAL HIGH (ref 38.4–76.8)
Platelets: 281 10*3/uL (ref 145–400)
RBC: 3.59 10*6/uL — ABNORMAL LOW (ref 3.70–5.45)
RDW: 16 % — AB (ref 11.2–14.5)
WBC: 8.4 10*3/uL (ref 3.9–10.3)
lymph#: 0.8 10*3/uL — ABNORMAL LOW (ref 0.9–3.3)
nRBC: 0 % (ref 0–0)

## 2015-09-29 NOTE — Telephone Encounter (Signed)
Gave patient avs report and appointments for March  °

## 2015-09-29 NOTE — Progress Notes (Signed)
[name]  [phone] [phone]  Clinic follow up Note   Patient Care Team: Carol Ada, MD as PCP - General (Family Medicine) Rolm Bookbinder, MD as Consulting Physician (General Surgery) 09/29/2015   CHIEF COMPLAINTS:  Follow up stage III colon cancer   Oncology History   Cancer of right colon [facility])   Staging form: Colon and Rectum, AJCC 7th Edition     Pathologic stage from 08/03/2015: Stage IIIB (T4a, N1a, cM0) - Signed by Truitt Merle, MD on 09/04/2015       Cancer of right colon ([place])   03/31/2015 Imaging CT abdomen showed appendicitis, perforated, with periappendiceal abscess. And small pulmonary nodules.   04/03/2015 Procedure CT guided placement of going Through into the right lower abdominal quadrant with aspiration of a total of 30 mL prudent fluid.    08/03/2015 Initial Diagnosis Cancer of right colon ([place])   08/03/2015 Surgery Right hemicolectomy   08/03/2015 Pathology Results Right hemicolectomy showed invasive adenocarcinoma, moderately differentiated, likely arising from a cecal valve. One out of 40 lymph nodes were positive, (+) LVI, margins negative    08/03/2015 Miscellaneous colon cance MSI-stable     HISTORY OF PRESENTING ILLNESS:  [name] 50 y.o. female without significant PMH is here because of her recently diagnosed stage III colon cancer. She is accompanied by her boyfriend to the clinic today.  She developed fever and headache in September 2016, was found to have ruptured appendix and was admitted to hospital. CT scan revealed a peri-appendiceal abscess. She underwent abscess drainage tube basement by interventional radiology on 04/17/2015, and a course of antibiotics. She had a multiple drainage tube exchange by IR, but the abscess did not heal. She was referred to Dr. Donne [name]. Multiple CT scan in October and November showed persistent peri-appendiceal fluid collection. She was finally taken to the operation room on  08/03/2015. Intraoperatively, she was found to have a 2 cm whole present in the cecum, she underwent ileocectomy with an anastomosis in the hepatic fixture. 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  She had wound infection issue after surgery. It has been healing slowly, she is still on wet-to-dry dressing change twice daily, has home care nurse. She has moderate pain, 6-7/10, sometime postional, she is on oxycodone 2-3 times a day. Her appetite and eating is getting better, lost about 25lb since 03/2015, no fever and chills for the past 3-4 days, just finished abx yesterday.   CURRENT THERAPY: adjuvant chemotherapy CAPEOX, capecitabine 1526m bid on Day1-14, oxaliplatin 1379mm2 on D1, every 21 days, started on 09/18/2015, plan for 8 cycles   INTERIM HISTORY:  RoOrlyeturns for follow-up. She developed severe fatigue, anorexia after first cycle of oxaliplatin and 1 day of Xeloda. Xeloda has been held since then. She was not able to get out of bed for a few days after the chemotherapy, was seen in our clinic 3 times last week for IV fluids, 1 episode of fever, and symptom management. She was treated for UTI with Cipro, and just finished the medication. She has been gradually recovering well, she was able to return to work 2 days ago, her appetite has improved, she is able to eat small amount meals. Her wound has been clean, dressing is being changed every day by her boyfriend, and the remote continues to heal well.  MEDICAL HISTORY:  Past Medical History  Diagnosis Date  . Anemia   . Depression   . Anxiety   . JP drain  bleeding   . GERD (gastroesophageal reflux disease)   . Fibromyalgia     SURGICAL HISTORY: Past Surgical History  Procedure Laterality Date  . Abdominal hysterectomy    . Gastric bypass    . Colon surgery  08/03/2015  . Laparoscopic ileocecectomy Right 08/03/2015    Procedure: LAPAROSCOPIC DIAGNOSTIC RIGHT COLECTOMY;  Surgeon: Rolm Bookbinder, MD;   Location: [place];  Service: General;  Laterality: Right;  . Colostomy revision N/A 08/03/2015    Procedure: COLON RESECTION RIGHT;  Surgeon: Rolm Bookbinder, MD;  Location: [place];  Service: General;  Laterality: N/A;    SOCIAL HISTORY: Social History   Social History  . Marital Status: Married    Spouse Name: N/A  . Number of Children: N/A  . Years of Education: N/A   Occupational History  . Not on file.   Social History Main Topics  . Smoking status: Never Smoker   . Smokeless tobacco: Never Used  . Alcohol Use: 1.2 oz/week    2 Glasses of wine per week     Comment: moderate 1-2 times a month   . Drug Use: No  . Sexual Activity: Not on file   Other Topics Concern  . Not on file   Social History Narrative   Single, fiance [name]   Works [employer] 11:30am to 8 pm    FAMILY HISTORY: Family History  Problem Relation Age of Onset  . Cancer Father 35    small cell lung cancer and colon cancer   . Cancer Sister 50    small cell lung cancer  . Cancer Maternal Aunt     ovarian cancer  . Cancer Paternal Uncle     brain cancer   . Cancer Other 40    ovarian cancer (maternal niece)    ALLERGIES:  is allergic to latex; cymbalta; and penicillins.  MEDICATIONS:  Current Outpatient Prescriptions  Medication Sig Dispense Refill  . acetaminophen (TYLENOL) 500 MG tablet Take 1,000 mg by mouth every 6 (six) hours as needed for fever. Reported on 09/24/2015    . cyclobenzaprine (FLEXERIL) 10 MG tablet Take 1 tablet (10 mg total) by mouth 3 (three) times daily as needed for muscle spasms. 30 tablet 0  . diphenhydrAMINE (BENADRYL) 25 mg capsule Take 25 mg by mouth every 6 (six) hours as needed for sleep.    Marland Kitchen escitalopram (LEXAPRO) 10 MG tablet Take 10 mg by mouth daily.    . ferrous sulfate 325 (65 FE) MG tablet Take 325 mg by mouth 2 (two) times daily with a meal. Reported on 09/24/2015    . oxyCODONE 10 MG TABS Take 1 tablet (10 mg total) by mouth every 4 (four)  hours as needed for moderate pain. 30 tablet 0  . potassium chloride SA (K-DUR,KLOR-CON) 20 MEQ tablet Take 1 tab PO TID x 1 day; then take 1 tab PO BID till gone. 14 tablet 1  . traMADol (ULTRAM) 50 MG tablet Take 1-2 tablets (50-100 mg total) by mouth every 6 (six) hours as needed. 45 tablet 1  . vitamin B-12 (CYANOCOBALAMIN) 1000 MCG tablet Take 500 mcg by mouth daily. Reported on 09/24/2015    . vitamin C (ASCORBIC ACID) 500 MG tablet Take 500 mg by mouth daily.    . capecitabine (XELODA) 500 MG tablet Take 3 tablets (1,500 mg total) by mouth 2 (two) times daily after a meal. Take on days 1-14 of chemotherapy. (Patient not taking: Reported on 09/22/2015) 84 tablet 1  .  dexamethasone (DECADRON) 4 MG tablet Take 2 tablets (8 mg total) by mouth daily. Start day after chemo and take for 3 days after each IV chemo (Patient not taking: Reported on 09/24/2015) 30 tablet 1  . ondansetron (ZOFRAN) 8 MG tablet Take 1 tablet (8 mg total) by mouth as directed. Take twice daily as needed for nausea-start on day 3 of chemotherapy (Patient not taking: Reported on 09/24/2015) 30 tablet 1  . prochlorperazine (COMPAZINE) 10 MG tablet Take 1 tablet (10 mg total) by mouth every 6 (six) hours as needed for nausea or vomiting. (Patient not taking: Reported on 09/29/2015) 30 tablet 1   No current facility-administered medications for this visit.   Facility-Administered Medications Ordered in Other Visits  Medication Dose Route Frequency Provider Last Rate Last Dose  . clindamycin (CLEOCIN) 900 mg in dextrose 5 % 50 mL IVPB  900 mg Intravenous 60 min Pre-Op Rolm Bookbinder, MD       And  . gentamicin (GARAMYCIN) 310 mg in dextrose 5 % 50 mL IVPB  5 mg/kg Intravenous 60 min Pre-Op Rolm Bookbinder, MD        REVIEW OF SYSTEMS:   Constitutional: Denies fevers, chills or abnormal night sweats Eyes: Denies blurriness of vision, double vision or watery eyes Ears, nose, mouth, throat, and face: Denies mucositis or sore  throat Respiratory: Denies cough, dyspnea or wheezes Cardiovascular: Denies palpitation, chest discomfort or lower extremity swelling Gastrointestinal:  Denies nausea, heartburn or change in bowel habits Skin: Denies abnormal skin rashes Lymphatics: Denies new lymphadenopathy or easy bruising Neurological:Denies numbness, tingling or new weaknesses Behavioral/Psych: Mood is stable, no new changes  All other systems were reviewed with the patient and are negative.  PHYSICAL EXAMINATION: ECOG PERFORMANCE STATUS: 1 - Symptomatic but completely ambulatory  Filed Vitals:   09/29/15 1212  BP: 94/59  Pulse: 115  Temp: 98.9 F (37.2 C)  Resp: 18   Filed Weights   09/29/15 1212  Weight: 133 lb 4.8 oz (60.464 kg)    GENERAL:alert, no distress and comfortable SKIN: skin color, texture, turgor are normal, no rashes or significant lesions EYES: normal, conjunctiva are pink and non-injected, sclera clear OROPHARYNX:no exudate, no erythema and lips, buccal mucosa, and tongue normal  NECK: supple, thyroid normal size, non-tender, without nodularity LYMPH:  no palpable lymphadenopathy in the cervical, axillary or inguinal   LUNGS: clear to auscultation and percussion with normal breathing effort HEART: regular rate & rhythm and no murmurs and no lower extremity edema ABDOMEN:abdomen soft, non-tender and normal bowel sounds,  Her surgical wound will covered by gauze. She showed me the picture of the wound which was taking this morning, the open wound is about 2.5 cm long, very narrow, about 0.5 cm deep, appears clean without discharge. Musculoskeletal:no cyanosis of digits and no clubbing  PSYCH: alert & oriented x 3 with fluent speech NEURO: no focal motor/sensory deficits  LABORATORY DATA:  I have reviewed the data as listed CBC Latest Ref Rng 09/29/2015 09/24/2015 09/22/2015  WBC 3.9 - 10.3 10e3/uL 8.4 4.7 1.8(L)  Hemoglobin 11.6 - 15.9 g/dL 8.2(L) 8.4(L) 9.8(L)  Hematocrit 34.8 - 46.6 %  25.9(L) 25.7(L) 30.9(L)  Platelets 145 - 400 10e3/uL 281 199 214      Recent Labs  08/04/15 0346 08/06/15 0427 08/10/15 0645 08/11/15 0013  09/22/15 0943 09/24/15 1331 09/29/15 1135  NA 136 134*  --  134*  < > 132* 132* 132*  K 3.8 3.9  --  3.5  < > 4.0 2.7  Repeated and Verified* 3.7  CL 103 104  --  96*  --   --   --   --   CO2 25 21*  --  25  < > 19* 21* 24  GLUCOSE 157* 122*  --  104*  < > 165* 98 117  BUN 5* <5*  --  7  < > 10.0 5.4* 8.0  CREATININE 0.63 0.45 0.46 0.51  < > 0.7 0.7 0.6  CALCIUM 8.6* 9.0  --  8.7*  < > 9.6 8.5 8.4  GFRNONAA >60 >60 >60 >60  --   --   --   --   GFRAA >60 >60 >60 >60  --   --   --   --   PROT  --   --   --   --   < > 7.4 6.4 5.9*  ALBUMIN  --   --   --   --   < > 3.0* 2.9* 2.6*  AST  --   --   --   --   < > 201* 55* 47*  ALT  --   --   --   --   < > 157* 66* 54  ALKPHOS  --   --   --   --   < > 394* 288* 315*  BILITOT  --   --   --   --   < > 0.42 0.30 0.33  < > = values in this interval not displayed. CEA  Status: Finalresult Visible to patient:  MyChart Nextappt: 09/18/2015 at 09:30 AM in [facility]) Dx:  Cancer of right colon ([place])         Ref Range 2wk ago    CEA 0.0 - 4.7 ng/mL 1.3        Results for KASSAUNDRA, HAIR (MRN [id number]) as of 09/19/2015 22:11  Ref. Range 09/18/2015 09:35  Iron Latest Ref Range: 41-142 ug/dL 18 (L)  UIBC Latest Ref Range: 120-384 ug/dL 367  TIBC Latest Ref Range: 236-444 ug/dL 385  %SAT Latest Ref Range: 21-57 % 5 (L)  Ferritin Latest Ref Range: 9-269 ng/ml 12    PATHOLOGY REPORT Diagnosis 08/03/2015 Colon, segmental resection, Right - INVASIVE ADENOCARCINOMA WITH ABUNDANT EXTRACELLULAR MUCIN, MODERATELY DIFFERENTIATED, LIKELY ARISING FROM ILEOCECAL VALVE. - ADENOCARCINOMA EXTENDS AT LEAST INTO THE PERICOLONIC SOFT TISSUE AND IS ASSOCIATED WITH TRANSMURAL DEFECT. - THE PROXIMAL AND DISTAL RESECTION MARGINS ARE NEGATIVE FOR ADENOCARCINOMA. - ACELLULAR MUCIN IS  GROSSLY PRESENT AT THE SEROSAL SURFACE. - LYMPHOVASCULAR INVASION IS IDENTIFIED. - METASTATIC CARCINOMA IN 1 OF 40 LYMPH NODES (1/40). - SEE ONCOLOGY TABLE BELOW. Microscopic Comment COLON AND RECTUM (INCLUDING TRANS-ANAL RESECTION): Specimen: Right colon and terminal ileum. Procedure: Resection. Tumor site: Likely ileocecal valve. Specimen integrity: Transmural defect(s). Macroscopic intactness of mesorectum: N/A Macroscopic tumor perforation: Present. Invasive tumor: Maximum size: At least 2.5 cm. Histologic type(s): Adenocarcinoma with abundant extracellular mucin. Histologic grade and differentiation: G2: moderately differentiated. Type of polyp in which invasive carcinoma arose: Tubular adenoma. Microscopic extension of invasive tumor: Adenocarcinoma extends into pericolonic soft tissue and acellular mucin is grossly present at the serosal surface. Lymph-Vascular invasion: Present. Peri-neural invasion: Not identified. Tumor deposit(s) (discontinuous extramural extension): Not identified. Resection margins: Proximal margin: Negative for carcinoma. Distal margin: Negative for carcinoma. Circumferential (radial) (posterior ascending, posterior descending; lateral and posterior mid-rectum; and entire lower 1/3 rectum): Acellular mucin is present at the circumferential tissue edge. Treatment effect (neo-adjuvant therapy): N/A Additional polyp(s): Not identified. Non-neoplastic findings:  No significant findings. Lymph nodes: number examined 40; number positive: 1 Pathologic Staging: at least pT4a, pN1a. Ancillary studies: MMR by IHC and MSI by PCR will be performed and the results reported separately. Additional studies can be performed upon clinician request.   ADDITIONAL INFORMATION: Mismatch Repair (MMR) Protein Immunohistochemistry (IHC) IHC Expression Result: MLH1: EQUIVOCAL MSH2: EQUIVOCAL MSH6: EQUIVOCAL PMS2: EQUIVOCAL * Internal control demonstrates intact nuclear  expression Interpretation: EQUIVOCAL The tumor shows partial staining with all anibodies and hence the results are considered equivocal. Correlated with molecular based MSI testing is strongly recommended.    RADIOGRAPHIC STUDIES: I have personally reviewed the radiological images as listed and agreed with the findings in the report. Ct Chest Wo Contrast  09/03/2015  CLINICAL DATA:  Colon cancer staging.  Initial evaluation EXAM: CT CHEST WITHOUT CONTRAST TECHNIQUE: Multidetector CT imaging of the chest was performed following the standard protocol without IV contrast. COMPARISON:  CT 07/02/2015 common 04/01/2015 FINDINGS: Mediastinum/Nodes: No axillary or supraclavicular adenopathy. No mediastinal hilar adenopathy. No pericardial fluid. Esophagus normal. Lungs/Pleura: 6 mm nodule in the RIGHT lower lobe (image 38, series 5). This region was not imaged on comparison CT abdomen. 4 mm nodule in the LEFT lower lobe (image 41, series 5) is not changed. Upper abdomen: Limited view of the liver, kidneys, pancreas are unremarkable. Normal adrenal glands. Post bariatric surgery. Musculoskeletal: No aggressive osseous lesion. IMPRESSION: 1. A 6 mm RIGHT lower lobe nodule not previously imaged. Recommend attention on follow-up. 2. Stable small LEFT lower lobe pulmonary nodule compared to prior CT. 3. No mediastinal lymphadenopathy. Electronically Signed   By: Suzy Bouchard M.D.   On: 09/03/2015 17:34    ASSESSMENT & PLAN:  50 year old Caucasian female, with past medical history of fibromyalgia, depression, presented with ruptured cecum and pericolonic abscess, required multiple drainage, antibiotics, and eventually right colectomy.  1. Right colon cancer, cecum, moderately differentiated adenocarcinoma, pT4N1aM0, stage IIIA, with perforation, MSI-stable  - I reviewed her scan findings, and surgical pathology results in great details with patient and her boyfriend. -Her CT chest was negative for  metastasis. - she has locally advanced stage III  Disease, with particular high risk features of T4 tumor, proliferation for 3-4 months,  And positive lymph nodes, she is at very high risk for cancer recurrence, especially peritoneal carcinomatosis. - I strongly recommend her to consider adjuvant chemotherapy, FOLFOX or CAPEOX. She opted CAPEOX,with oxaliplatin 130 mg/m, on day one, andcapecitabien 1000 mg/m (dose can be reduced to 865m/m2 if tolerance issue) twice daily, on day 1-14, every 21 day cycle, a total of 8 cycles  -She received the first dose of oxaliplatin and 1 day of Xeloda, tolerated poorly. I recommend Xeloda alone for second cycle to see if she can tolerate.  2. Abdominal wound, history of abdominal abscess -The wound is healing well, she will continue dressing change and a follow-up with Dr. WDonne [name] 3. Fatigue and anorexia secondary to chemotherapy therapy -She is recovering well.  -I encouraged her taking nutritional supplement.   4. Anemia, iron deficient anemia  - likely secondary to surgery,  Chronic infection and GI bleeding from the tumor -Iron study today showed low serum iron, transferrin saturation and ferritin 12, which is consistent with iron deficiency -Given her ongoing chemotherapy, I recommend her to have IV Feraheme, to improve her anemia quickly potential side effects were discussed with her, especially anaphylactic reaction, she agrees to proceed,  -continue iron pill over-the-counter 1-2 tablets a day  5. Fibromyalgia and depression - she  will continue medication, and follow-up with her primary care physician  Plan -She will return to clinic on March 20 for lab and follow-up. If she recovers well, we'll start cycle 2 chemotherapy with Xeloda 1000 mg twice daily (dose reduced) -IV Feraheme next week and March 20   All questions were answered. The patient knows to call the clinic with any problems, questions or concerns. I spent 25 minutes counseling  the patient face to face. The total time spent in the appointment was 30 minutes and more than 50% was on counseling.     Truitt Merle, MD 09/29/2015

## 2015-10-05 ENCOUNTER — Ambulatory Visit (HOSPITAL_BASED_OUTPATIENT_CLINIC_OR_DEPARTMENT_OTHER): Payer: Managed Care, Other (non HMO)

## 2015-10-05 VITALS — BP 100/70 | HR 120 | Temp 98.5°F | Resp 18

## 2015-10-05 DIAGNOSIS — D509 Iron deficiency anemia, unspecified: Secondary | ICD-10-CM | POA: Diagnosis not present

## 2015-10-05 MED ORDER — SODIUM CHLORIDE 0.9 % IV SOLN
Freq: Once | INTRAVENOUS | Status: AC
  Administered 2015-10-05: 09:00:00 via INTRAVENOUS

## 2015-10-05 MED ORDER — SODIUM CHLORIDE 0.9 % IV SOLN
510.0000 mg | Freq: Once | INTRAVENOUS | Status: AC
  Administered 2015-10-05: 510 mg via INTRAVENOUS
  Filled 2015-10-05: qty 17

## 2015-10-05 NOTE — Patient Instructions (Signed)

## 2015-10-09 ENCOUNTER — Other Ambulatory Visit: Payer: Self-pay | Admitting: Hematology

## 2015-10-12 ENCOUNTER — Ambulatory Visit (HOSPITAL_BASED_OUTPATIENT_CLINIC_OR_DEPARTMENT_OTHER): Payer: Managed Care, Other (non HMO) | Admitting: Hematology

## 2015-10-12 ENCOUNTER — Ambulatory Visit (HOSPITAL_BASED_OUTPATIENT_CLINIC_OR_DEPARTMENT_OTHER): Payer: Managed Care, Other (non HMO)

## 2015-10-12 ENCOUNTER — Other Ambulatory Visit (HOSPITAL_BASED_OUTPATIENT_CLINIC_OR_DEPARTMENT_OTHER): Payer: Managed Care, Other (non HMO)

## 2015-10-12 ENCOUNTER — Telehealth: Payer: Self-pay | Admitting: Hematology

## 2015-10-12 ENCOUNTER — Encounter: Payer: Self-pay | Admitting: Hematology

## 2015-10-12 VITALS — BP 96/44 | HR 94 | Temp 98.5°F | Resp 18 | Ht 61.0 in | Wt 130.7 lb

## 2015-10-12 VITALS — BP 105/53 | HR 99 | Temp 98.0°F | Resp 16

## 2015-10-12 DIAGNOSIS — C182 Malignant neoplasm of ascending colon: Secondary | ICD-10-CM

## 2015-10-12 DIAGNOSIS — C180 Malignant neoplasm of cecum: Secondary | ICD-10-CM

## 2015-10-12 DIAGNOSIS — C779 Secondary and unspecified malignant neoplasm of lymph node, unspecified: Secondary | ICD-10-CM

## 2015-10-12 DIAGNOSIS — S31109A Unspecified open wound of abdominal wall, unspecified quadrant without penetration into peritoneal cavity, initial encounter: Secondary | ICD-10-CM | POA: Insufficient documentation

## 2015-10-12 DIAGNOSIS — S31109D Unspecified open wound of abdominal wall, unspecified quadrant without penetration into peritoneal cavity, subsequent encounter: Secondary | ICD-10-CM

## 2015-10-12 DIAGNOSIS — M791 Myalgia: Secondary | ICD-10-CM

## 2015-10-12 DIAGNOSIS — D509 Iron deficiency anemia, unspecified: Secondary | ICD-10-CM

## 2015-10-12 DIAGNOSIS — F329 Major depressive disorder, single episode, unspecified: Secondary | ICD-10-CM

## 2015-10-12 LAB — COMPREHENSIVE METABOLIC PANEL
ALBUMIN: 3.1 g/dL — AB (ref 3.5–5.0)
ALK PHOS: 139 U/L (ref 40–150)
ALT: 18 U/L (ref 0–55)
ANION GAP: 7 meq/L (ref 3–11)
AST: 23 U/L (ref 5–34)
BUN: 8.6 mg/dL (ref 7.0–26.0)
CALCIUM: 8.9 mg/dL (ref 8.4–10.4)
CO2: 26 mEq/L (ref 22–29)
Chloride: 107 mEq/L (ref 98–109)
Creatinine: 0.7 mg/dL (ref 0.6–1.1)
Glucose: 92 mg/dl (ref 70–140)
POTASSIUM: 3.6 meq/L (ref 3.5–5.1)
Sodium: 140 mEq/L (ref 136–145)
Total Bilirubin: <0.3 mg/dL (ref 0.20–1.20)
Total Protein: 7 g/dL (ref 6.4–8.3)

## 2015-10-12 LAB — CBC WITH DIFFERENTIAL/PLATELET
BASO%: 1.6 % (ref 0.0–2.0)
BASOS ABS: 0.1 10*3/uL (ref 0.0–0.1)
EOS ABS: 0.2 10*3/uL (ref 0.0–0.5)
EOS%: 3.8 % (ref 0.0–7.0)
HCT: 29.6 % — ABNORMAL LOW (ref 34.8–46.6)
HEMOGLOBIN: 9 g/dL — AB (ref 11.6–15.9)
LYMPH#: 1.5 10*3/uL (ref 0.9–3.3)
LYMPH%: 32.4 % (ref 14.0–49.7)
MCH: 23.6 pg — AB (ref 25.1–34.0)
MCHC: 30.4 g/dL — AB (ref 31.5–36.0)
MCV: 77.5 fL — ABNORMAL LOW (ref 79.5–101.0)
MONO#: 0.5 10*3/uL (ref 0.1–0.9)
MONO%: 10.9 % (ref 0.0–14.0)
NEUT#: 2.3 10*3/uL (ref 1.5–6.5)
NEUT%: 51.3 % (ref 38.4–76.8)
Platelets: 294 10*3/uL (ref 145–400)
RBC: 3.82 10*6/uL (ref 3.70–5.45)
RDW: 19.3 % — ABNORMAL HIGH (ref 11.2–14.5)
WBC: 4.5 10*3/uL (ref 3.9–10.3)

## 2015-10-12 MED ORDER — SODIUM CHLORIDE 0.9 % IV SOLN
Freq: Once | INTRAVENOUS | Status: AC
  Administered 2015-10-12: 10:00:00 via INTRAVENOUS

## 2015-10-12 MED ORDER — SODIUM CHLORIDE 0.9 % IV SOLN
510.0000 mg | Freq: Once | INTRAVENOUS | Status: AC
  Administered 2015-10-12: 510 mg via INTRAVENOUS
  Filled 2015-10-12: qty 17

## 2015-10-12 NOTE — Patient Instructions (Signed)

## 2015-10-12 NOTE — Telephone Encounter (Signed)
per pof to sch pt appt-gave pt copy of avs °

## 2015-10-12 NOTE — Progress Notes (Signed)
[place]  [phone] Fax:([phone]) [phone]  Clinic follow up Note   Patient Care Team: Carol Ada, MD as PCP - General (Family Medicine) Rolm Bookbinder, MD as Consulting Physician (General Surgery) 10/12/2015   CHIEF COMPLAINTS:  Follow up stage III colon cancer   Oncology History   Cancer of right colon [facility])   Staging form: Colon and Rectum, AJCC 7th Edition     Pathologic stage from 08/03/2015: Stage IIIB (T4a, N1a, cM0) - Signed by Truitt Merle, MD on 09/04/2015       Cancer of right colon ([place])   03/31/2015 Imaging CT abdomen showed appendicitis, perforated, with periappendiceal abscess. And small pulmonary nodules.   04/03/2015 Procedure CT guided placement of going Through into the right lower abdominal quadrant with aspiration of a total of 30 mL prudent fluid.    08/03/2015 Initial Diagnosis Cancer of right colon ([place])   08/03/2015 Surgery Right hemicolectomy   08/03/2015 Pathology Results Right hemicolectomy showed invasive adenocarcinoma, moderately differentiated, likely arising from a cecal valve. One out of 40 lymph nodes were positive, (+) LVI, margins negative    08/03/2015 Miscellaneous colon cance MSI-stable     HISTORY OF PRESENTING ILLNESS:  [name] 50 y.o. female without significant PMH is here because of her recently diagnosed stage III colon cancer. She is accompanied by her boyfriend to the clinic today.  She developed fever and headache in September 2016, was found to have ruptured appendix and was admitted to hospital. CT scan revealed a peri-appendiceal abscess. She underwent abscess drainage tube basement by interventional radiology on 04/17/2015, and a course of antibiotics. She had a multiple drainage tube exchange by IR, but the abscess did not heal. She was referred to Dr. Donne [name]. Multiple CT scan in October and November showed persistent peri-appendiceal fluid collection. She was finally taken to the operation room on  08/03/2015. Intraoperatively, she was found to have a 2 cm whole present in the cecum, she underwent ileocectomy with an anastomosis in the hepatic fixture. 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  She had wound infection issue after surgery. It has been healing slowly, she is still on wet-to-dry dressing change twice daily, has home care nurse. She has moderate pain, 6-7/10, sometime postional, she is on oxycodone 2-3 times a day. Her appetite and eating is getting better, lost about 25lb since 03/2015, no fever and chills for the past 3-4 days, just finished abx yesterday.   CURRENT THERAPY: adjuvant chemotherapy CAPEOX, capecitabine 1567m bid on Day1-14, oxaliplatin 1357mm2 on D1, every 21 days, started on 09/18/2015, plan for 8 cycles, oxaliplatin held since cycle 2 due to poor tolerance   INTERIM HISTORY:  [name] for follow-up. She feels better overall, her energy level is back to her normal level, her appetite fluctuates, she overall eats well. She is back to work. She tolerated the first of Feraheme infusion well last time, and is here for second infusion. No fever or chills, her abdominal wound is healing well, is almost closed now.   MEDICAL HISTORY:  Past Medical History  Diagnosis Date  . Anemia   . Depression   . Anxiety   . JP drain bleeding   . GERD (gastroesophageal reflux disease)   . Fibromyalgia     SURGICAL HISTORY: Past Surgical History  Procedure Laterality Date  . Abdominal hysterectomy    . Gastric bypass    . Colon surgery  08/03/2015  . Laparoscopic ileocecectomy Right 08/03/2015  Procedure: LAPAROSCOPIC DIAGNOSTIC RIGHT COLECTOMY;  Surgeon: Rolm Bookbinder, MD;  Location: [place];  Service: General;  Laterality: Right;  . Colostomy revision N/A 08/03/2015    Procedure: COLON RESECTION RIGHT;  Surgeon: Rolm Bookbinder, MD;  Location: [place];  Service: General;  Laterality: N/A;    SOCIAL HISTORY: Social History   Social History    . Marital Status: Married    Spouse Name: N/A  . Number of Children: N/A  . Years of Education: N/A   Occupational History  . Not on file.   Social History Main Topics  . Smoking status: Never Smoker   . Smokeless tobacco: Never Used  . Alcohol Use: 1.2 oz/week    2 Glasses of wine per week     Comment: moderate 1-2 times a month   . Drug Use: No  . Sexual Activity: Not on file   Other Topics Concern  . Not on file   Social History Narrative   Single, fiance [name]   Works [employer] 11:30am to 8 pm    FAMILY HISTORY: Family History  Problem Relation Age of Onset  . Cancer Father 58    small cell lung cancer and colon cancer   . Cancer Sister 50    small cell lung cancer  . Cancer Maternal Aunt     ovarian cancer  . Cancer Paternal Uncle     brain cancer   . Cancer Other 40    ovarian cancer (maternal niece)    ALLERGIES:  is allergic to latex; cymbalta; and penicillins.  MEDICATIONS:  Current Outpatient Prescriptions  Medication Sig Dispense Refill  . acetaminophen (TYLENOL) 500 MG tablet Take 1,000 mg by mouth every 6 (six) hours as needed for fever. Reported on 09/24/2015    . cyclobenzaprine (FLEXERIL) 10 MG tablet Take 1 tablet (10 mg total) by mouth 3 (three) times daily as needed for muscle spasms. 30 tablet 0  . diphenhydrAMINE (BENADRYL) 25 mg capsule Take 25 mg by mouth every 6 (six) hours as needed for sleep.    Marland Kitchen escitalopram (LEXAPRO) 10 MG tablet Take 10 mg by mouth daily.    Marland Kitchen oxyCODONE 10 MG TABS Take 1 tablet (10 mg total) by mouth every 4 (four) hours as needed for moderate pain. 30 tablet 0  . potassium chloride SA (K-DUR,KLOR-CON) 20 MEQ tablet Take 1 tab PO TID x 1 day; then take 1 tab PO BID till gone. 14 tablet 1  . traMADol (ULTRAM) 50 MG tablet Take 1-2 tablets (50-100 mg total) by mouth every 6 (six) hours as needed. 45 tablet 1  . vitamin B-12 (CYANOCOBALAMIN) 1000 MCG tablet Take 500 mcg by mouth daily. Reported on  09/24/2015    . vitamin C (ASCORBIC ACID) 500 MG tablet Take 500 mg by mouth daily.    . capecitabine (XELODA) 500 MG tablet Take 3 tablets (1,500 mg total) by mouth 2 (two) times daily after a meal. Take on days 1-14 of chemotherapy. (Patient not taking: Reported on 09/22/2015) 84 tablet 1  . dexamethasone (DECADRON) 4 MG tablet Take 2 tablets (8 mg total) by mouth daily. Start day after chemo and take for 3 days after each IV chemo (Patient not taking: Reported on 09/24/2015) 30 tablet 1  . ferrous sulfate 325 (65 FE) MG tablet Take 325 mg by mouth 2 (two) times daily with a meal. Reported on 10/12/2015    . ondansetron (ZOFRAN) 8 MG tablet Take 1 tablet (8 mg total) by mouth  as directed. Take twice daily as needed for nausea-start on day 3 of chemotherapy (Patient not taking: Reported on 09/24/2015) 30 tablet 1  . prochlorperazine (COMPAZINE) 10 MG tablet Take 1 tablet (10 mg total) by mouth every 6 (six) hours as needed for nausea or vomiting. (Patient not taking: Reported on 09/29/2015) 30 tablet 1   No current facility-administered medications for this visit.   Facility-Administered Medications Ordered in Other Visits  Medication Dose Route Frequency Provider Last Rate Last Dose  . clindamycin (CLEOCIN) 900 mg in dextrose 5 % 50 mL IVPB  900 mg Intravenous 60 min Pre-Op Rolm Bookbinder, MD       And  . gentamicin (GARAMYCIN) 310 mg in dextrose 5 % 50 mL IVPB  5 mg/kg Intravenous 60 min Pre-Op Rolm Bookbinder, MD        REVIEW OF SYSTEMS:   Constitutional: Denies fevers, chills or abnormal night sweats Eyes: Denies blurriness of vision, double vision or watery eyes Ears, nose, mouth, throat, and face: Denies mucositis or sore throat Respiratory: Denies cough, dyspnea or wheezes Cardiovascular: Denies palpitation, chest discomfort or lower extremity swelling Gastrointestinal:  Denies nausea, heartburn or change in bowel habits Skin: Denies abnormal skin rashes Lymphatics: Denies new  lymphadenopathy or easy bruising Neurological:Denies numbness, tingling or new weaknesses Behavioral/Psych: Mood is stable, no new changes  All other systems were reviewed with the patient and are negative.  PHYSICAL EXAMINATION: ECOG PERFORMANCE STATUS: 1 - Symptomatic but completely ambulatory  Filed Vitals:   10/12/15 0900  BP: 96/44  Pulse: 94  Temp: 98.5 F (36.9 C)  Resp: 18   Filed Weights   10/12/15 0900  Weight: 130 lb 11.2 oz (59.285 kg)    GENERAL:alert, no distress and comfortable SKIN: skin color, texture, turgor are normal, no rashes or significant lesions EYES: normal, conjunctiva are pink and non-injected, sclera clear OROPHARYNX:no exudate, no erythema and lips, buccal mucosa, and tongue normal  NECK: supple, thyroid normal size, non-tender, without nodularity LYMPH:  no palpable lymphadenopathy in the cervical, axillary or inguinal   LUNGS: clear to auscultation and percussion with normal breathing effort HEART: regular rate & rhythm and no murmurs and no lower extremity edema ABDOMEN:abdomen soft, non-tender and normal bowel sounds,  Her surgical wound will covered by gauze. She states it's Almost healed, it has scabby. Musculoskeletal:no cyanosis of digits and no clubbing  PSYCH: alert & oriented x 3 with fluent speech NEURO: no focal motor/sensory deficits  LABORATORY DATA:  I have reviewed the data as listed CBC Latest Ref Rng 10/12/2015 09/29/2015 09/24/2015  WBC 3.9 - 10.3 10e3/uL 4.5 8.4 4.7  Hemoglobin 11.6 - 15.9 g/dL 9.0(L) 8.2(L) 8.4(L)  Hematocrit 34.8 - 46.6 % 29.6(L) 25.9(L) 25.7(L)  Platelets 145 - 400 10e3/uL 294 281 199      Recent Labs  08/04/15 0346 08/06/15 0427 08/10/15 0645 08/11/15 0013  09/24/15 1331 09/29/15 1135 10/12/15 0825  NA 136 134*  --  134*  < > 132* 132* 140  K 3.8 3.9  --  3.5  < > 2.7 Repeated and Verified* 3.7 3.6  CL 103 104  --  96*  --   --   --   --   CO2 25 21*  --  25  < > 21* 24 26  GLUCOSE 157* 122*   --  104*  < > 98 117 92  BUN 5* <5*  --  7  < > 5.4* 8.0 8.6  CREATININE 0.63 0.45 0.46 0.51  < >  0.7 0.6 0.7  CALCIUM 8.6* 9.0  --  8.7*  < > 8.5 8.4 8.9  GFRNONAA >60 >60 >60 >60  --   --   --   --   GFRAA >60 >60 >60 >60  --   --   --   --   PROT  --   --   --   --   < > 6.4 5.9* 7.0  ALBUMIN  --   --   --   --   < > 2.9* 2.6* 3.1*  AST  --   --   --   --   < > 55* 47* 23  ALT  --   --   --   --   < > 66* 54 18  ALKPHOS  --   --   --   --   < > 288* 315* 139  BILITOT  --   --   --   --   < > 0.30 0.33 <0.30  < > = values in this interval not displayed. CEA  Status: Finalresult Visible to patient:  MyChart Nextappt: 09/18/2015 at 09:30 AM in Oncology [facility] 4) Dx:  Cancer of right colon ([place])         Ref Range 2wk ago    CEA 0.0 - 4.7 ng/mL 1.3         PATHOLOGY REPORT Diagnosis 08/03/2015 Colon, segmental resection, Right - INVASIVE ADENOCARCINOMA WITH ABUNDANT EXTRACELLULAR MUCIN, MODERATELY DIFFERENTIATED, LIKELY ARISING FROM ILEOCECAL VALVE. - ADENOCARCINOMA EXTENDS AT LEAST INTO THE PERICOLONIC SOFT TISSUE AND IS ASSOCIATED WITH TRANSMURAL DEFECT. - THE PROXIMAL AND DISTAL RESECTION MARGINS ARE NEGATIVE FOR ADENOCARCINOMA. - ACELLULAR MUCIN IS GROSSLY PRESENT AT THE SEROSAL SURFACE. - LYMPHOVASCULAR INVASION IS IDENTIFIED. - METASTATIC CARCINOMA IN 1 OF 40 LYMPH NODES (1/40). - SEE ONCOLOGY TABLE BELOW. Microscopic Comment COLON AND RECTUM (INCLUDING TRANS-ANAL RESECTION): Specimen: Right colon and terminal ileum. Procedure: Resection. Tumor site: Likely ileocecal valve. Specimen integrity: Transmural defect(s). Macroscopic intactness of mesorectum: N/A Macroscopic tumor perforation: Present. Invasive tumor: Maximum size: At least 2.5 cm. Histologic type(s): Adenocarcinoma with abundant extracellular mucin. Histologic grade and differentiation: G2: moderately differentiated. Type of polyp in which invasive carcinoma arose: Tubular  adenoma. Microscopic extension of invasive tumor: Adenocarcinoma extends into pericolonic soft tissue and acellular mucin is grossly present at the serosal surface. Lymph-Vascular invasion: Present. Peri-neural invasion: Not identified. Tumor deposit(s) (discontinuous extramural extension): Not identified. Resection margins: Proximal margin: Negative for carcinoma. Distal margin: Negative for carcinoma. Circumferential (radial) (posterior ascending, posterior descending; lateral and posterior mid-rectum; and entire lower 1/3 rectum): Acellular mucin is present at the circumferential tissue edge. Treatment effect (neo-adjuvant therapy): N/A Additional polyp(s): Not identified. Non-neoplastic findings: No significant findings. Lymph nodes: number examined 40; number positive: 1 Pathologic Staging: at least pT4a, pN1a. Ancillary studies: MMR by IHC and MSI by PCR will be performed and the results reported separately. Additional studies can be performed upon clinician request.   ADDITIONAL INFORMATION: Mismatch Repair (MMR) Protein Immunohistochemistry (IHC) IHC Expression Result: MLH1: EQUIVOCAL MSH2: EQUIVOCAL MSH6: EQUIVOCAL PMS2: EQUIVOCAL * Internal control demonstrates intact nuclear expression Interpretation: EQUIVOCAL The tumor shows partial staining with all anibodies and hence the results are considered equivocal. Correlated with molecular based MSI testing is strongly recommended.    RADIOGRAPHIC STUDIES: I have personally reviewed the radiological images as listed and agreed with the findings in the report. No results found.  ASSESSMENT & PLAN:  50 year old Caucasian female, with past medical history of  fibromyalgia, depression, presented with ruptured cecum and pericolonic abscess, required multiple drainage, antibiotics, and eventually right colectomy.  1. Right colon cancer, cecum, moderately differentiated adenocarcinoma, pT4N1aM0, stage IIIA, with perforation,  MSI-stable  - I reviewed her scan findings, and surgical pathology results in great details with patient and her boyfriend. -Her CT chest was negative for metastasis. - she has locally advanced stage III  Disease, with particular high risk features of T4 tumor, proliferation for 3-4 months,  And positive lymph nodes, she is at very high risk for cancer recurrence, especially peritoneal carcinomatosis. - I strongly recommend her to consider adjuvant chemotherapy, FOLFOX or CAPEOX. She opted CAPEOX,with oxaliplatin 130 mg/m, on day one, andcapecitabien 1000 mg/m (dose can be reduced to 844m/m2 if tolerance issue) twice daily, on day 1-14, every 21 day cycle, a total of 8 cycles  -She received the first dose of oxaliplatin and 1 day of Xeloda, tolerated poorly. I recommend Xeloda alone for second cycle to see if she can tolerate. -She has recovered well, we'll start cycle 2 with reduced dose Xeloda alone. If she tolerates well, we'll try to 4 doses Xeloda next cycle, and consider adding low-dose oxaliplatin back from cycle 4.  -We also discussed about changing CAPOX to FOLFOX, she may likely tolerate it better. However due to her working schedule, she declined.  2. Abdominal wound, history of abdominal abscess -The wound is healing well, she will continue dressing change and a follow-up with Dr. WDonne [name] 3. Fatigue and anorexia secondary to chemotherapy therapy -She has recovered well  -I encouraged her taking nutritional supplement.   4. Anemia, iron deficient anemia  - likely secondary to surgery,  Chronic infection and GI bleeding from the tumor -Iron study today showed low serum iron, transferrin saturation and ferritin 12, which is consistent with iron deficiency -Given her ongoing chemotherapy, I recommend her to have IV Feraheme, she is here for second dose today. She tolerated well. -Resume iron pill over-the-counter 1-2 tablets a day in a few weeks   5. Fibromyalgia and depression -  she will continue medication, and follow-up with her primary care physician  Plan -Second dose IV Feraheme today -Start Xeloda tomorrow, 500 mg one tablet twice daily for 3 days, then 2 tablets twice daily for 7 days, then 3 tablets twice daily for 4 days then stop. -I'll see her back in 2-3 weeks. -Resume oral iron on next visit  All questions were answered. The patient knows to call the clinic with any problems, questions or concerns. I spent 25 minutes counseling the patient face to face. The total time spent in the appointment was 30 minutes and more than 50% was on counseling.     FTruitt Merle MD 10/12/2015

## 2015-10-15 ENCOUNTER — Telehealth: Payer: Self-pay | Admitting: Pharmacist

## 2015-10-15 NOTE — Telephone Encounter (Signed)
10/15/15: Attempted to reach patient for follow up on oral medication: Xeloda as Ms. Ding just restarted this week on 3/21. No answer. Left VM for patient to call back with any questions or issues.   Thank you,  Montel Clock, PharmD, [facility] [phone]

## 2015-10-16 ENCOUNTER — Telehealth: Payer: Self-pay | Admitting: *Deleted

## 2015-10-16 NOTE — Telephone Encounter (Signed)
  Oncology Nurse Navigator Documentation  Navigator Location: [facility] (10/16/15 1650) Navigator Encounter Type: Telephone (10/16/15 1650) Telephone: Outgoing Call;Patient Update (10/16/15 1650)    Called to check on her tolerance of Xeloda thus far. She started the 1000 mg bid today. No adverse effect thus far. Having some allergy/sinsus symptoms. Informed she is OK to take any OTC med she has used in the past for this. Will not interfere with the chemo. Requested she call if she develops any adverse effects.                                    Time Spent with Patient: 15 (10/16/15 1650)

## 2015-10-28 ENCOUNTER — Telehealth: Payer: Self-pay | Admitting: Pharmacist

## 2015-10-28 NOTE — Telephone Encounter (Signed)
10/28/15: Attempted to reach patient for follow up on oral medication: Xeloda. No answer. Left VM for patient to call back with any questions or issues.   Thank you,  Montel Clock, PharmD, [facility] [phone]

## 2015-11-02 ENCOUNTER — Other Ambulatory Visit (HOSPITAL_BASED_OUTPATIENT_CLINIC_OR_DEPARTMENT_OTHER): Payer: Managed Care, Other (non HMO)

## 2015-11-02 ENCOUNTER — Encounter: Payer: Self-pay | Admitting: Hematology

## 2015-11-02 ENCOUNTER — Ambulatory Visit (HOSPITAL_BASED_OUTPATIENT_CLINIC_OR_DEPARTMENT_OTHER): Payer: Managed Care, Other (non HMO) | Admitting: Hematology

## 2015-11-02 ENCOUNTER — Telehealth: Payer: Self-pay | Admitting: Hematology

## 2015-11-02 VITALS — BP 146/74 | HR 88 | Temp 98.1°F | Resp 18 | Ht 61.0 in | Wt 131.5 lb

## 2015-11-02 DIAGNOSIS — R530 Neoplastic (malignant) related fatigue: Secondary | ICD-10-CM | POA: Diagnosis not present

## 2015-11-02 DIAGNOSIS — M797 Fibromyalgia: Secondary | ICD-10-CM

## 2015-11-02 DIAGNOSIS — F329 Major depressive disorder, single episode, unspecified: Secondary | ICD-10-CM

## 2015-11-02 DIAGNOSIS — D509 Iron deficiency anemia, unspecified: Secondary | ICD-10-CM | POA: Diagnosis not present

## 2015-11-02 DIAGNOSIS — C180 Malignant neoplasm of cecum: Secondary | ICD-10-CM

## 2015-11-02 DIAGNOSIS — S31109D Unspecified open wound of abdominal wall, unspecified quadrant without penetration into peritoneal cavity, subsequent encounter: Secondary | ICD-10-CM

## 2015-11-02 DIAGNOSIS — C182 Malignant neoplasm of ascending colon: Secondary | ICD-10-CM

## 2015-11-02 DIAGNOSIS — C779 Secondary and unspecified malignant neoplasm of lymph node, unspecified: Secondary | ICD-10-CM | POA: Diagnosis not present

## 2015-11-02 DIAGNOSIS — R630 Anorexia: Secondary | ICD-10-CM

## 2015-11-02 LAB — CBC WITH DIFFERENTIAL/PLATELET
BASO%: 1.3 % (ref 0.0–2.0)
Basophils Absolute: 0.1 10e3/uL (ref 0.0–0.1)
EOS%: 2.4 % (ref 0.0–7.0)
Eosinophils Absolute: 0.1 10e3/uL (ref 0.0–0.5)
HCT: 32.5 % — ABNORMAL LOW (ref 34.8–46.6)
HGB: 10.4 g/dL — ABNORMAL LOW (ref 11.6–15.9)
LYMPH%: 40.5 % (ref 14.0–49.7)
MCH: 26.9 pg (ref 25.1–34.0)
MCHC: 32.2 g/dL (ref 31.5–36.0)
MCV: 83.7 fL (ref 79.5–101.0)
MONO#: 0.5 10e3/uL (ref 0.1–0.9)
MONO%: 11.5 % (ref 0.0–14.0)
NEUT#: 2.1 10e3/uL (ref 1.5–6.5)
NEUT%: 44.3 % (ref 38.4–76.8)
Platelets: 220 10e3/uL (ref 145–400)
RBC: 3.88 10e6/uL (ref 3.70–5.45)
RDW: 29.9 % — ABNORMAL HIGH (ref 11.2–14.5)
WBC: 4.7 10e3/uL (ref 3.9–10.3)
lymph#: 1.9 10e3/uL (ref 0.9–3.3)

## 2015-11-02 LAB — COMPREHENSIVE METABOLIC PANEL WITH GFR
ALT: 14 U/L (ref 0–55)
AST: 20 U/L (ref 5–34)
Albumin: 3.6 g/dL (ref 3.5–5.0)
Alkaline Phosphatase: 89 U/L (ref 40–150)
Anion Gap: 8 meq/L (ref 3–11)
BUN: 8 mg/dL (ref 7.0–26.0)
CO2: 24 meq/L (ref 22–29)
Calcium: 9.2 mg/dL (ref 8.4–10.4)
Chloride: 107 meq/L (ref 98–109)
Creatinine: 0.7 mg/dL (ref 0.6–1.1)
EGFR: >90 ml/min/1.73 m2
Glucose: 82 mg/dL (ref 70–140)
Potassium: 3.7 meq/L (ref 3.5–5.1)
Sodium: 140 meq/L (ref 136–145)
Total Bilirubin: 0.33 mg/dL (ref 0.20–1.20)
Total Protein: 6.9 g/dL (ref 6.4–8.3)

## 2015-11-02 MED ORDER — OXYCODONE HCL 5 MG PO TABS: 5.0000 mg | ORAL_TABLET | ORAL | Status: DC | PRN

## 2015-11-02 MED ORDER — ALPRAZOLAM 0.25 MG PO TABS: 0.2500 mg | ORAL_TABLET | Freq: Every evening | ORAL | Status: DC | PRN

## 2015-11-02 NOTE — Progress Notes (Signed)
[place]  [phone] [phone]  Clinic follow up Note   Patient Care Team: Carol Ada, MD as PCP - General (Family Medicine) Rolm Bookbinder, MD as Consulting Physician (General Surgery) 11/02/2015   CHIEF COMPLAINTS:  Follow up stage III colon cancer   Oncology History   Cancer of right colon [facility])   Staging form: Colon and Rectum, AJCC 7th Edition     Pathologic stage from 08/03/2015: Stage IIIB (T4a, N1a, cM0) - Signed by Truitt Merle, MD on 09/04/2015       Cancer of right colon ([place])   03/31/2015 Imaging CT abdomen showed appendicitis, perforated, with periappendiceal abscess. And small pulmonary nodules.   04/03/2015 Procedure CT guided placement of going Through into the right lower abdominal quadrant with aspiration of a total of 30 mL prudent fluid.    08/03/2015 Initial Diagnosis Cancer of right colon ([place])   08/03/2015 Surgery Right hemicolectomy   08/03/2015 Pathology Results Right hemicolectomy showed invasive adenocarcinoma, moderately differentiated, likely arising from a cecal valve. One out of 40 lymph nodes were positive, (+) LVI, margins negative    08/03/2015 Miscellaneous colon cance MSI-stable     HISTORY OF PRESENTING ILLNESS:  [name] 50 y.o. female without significant PMH is here because of her recently diagnosed stage III colon cancer. She is accompanied by her boyfriend to the clinic today.  She developed fever and headache in September 2016, was found to have ruptured appendix and was admitted to hospital. CT scan revealed a peri-appendiceal abscess. She underwent abscess drainage tube basement by interventional radiology on 04/17/2015, and a course of antibiotics. She had a multiple drainage tube exchange by IR, but the abscess did not heal. She was referred to Dr. Donne [name]. Multiple CT scan in October and November showed persistent peri-appendiceal fluid collection. She was finally taken to the operation room on  08/03/2015. Intraoperatively, she was found to have a 2 cm whole present in the cecum, she underwent ileocectomy with an anastomosis in the hepatic fixture. 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  She had wound infection issue after surgery. It has been healing slowly, she is still on wet-to-dry dressing change twice daily, has home care nurse. She has moderate pain, 6-7/10, sometime postional, she is on oxycodone 2-3 times a day. Her appetite and eating is getting better, lost about 25lb since 03/2015, no fever and chills for the past 3-4 days, just finished abx yesterday.   CURRENT THERAPY: adjuvant chemotherapy CAPEOX, capecitabine 1566m bid on Day1-14, oxaliplatin 1321mm2 on D1, every 21 days, started on 09/18/2015, plan for 8 cycles, oxaliplatin held since cycle 2 due to poor tolerance   INTERIM HISTORY:  RoCharika[place]urns for follow-up. She tolerated her second cycle of Xeloda with dose reduction very well. She denies any significant side effects, she has been feeling well overall, has decent appetite and energy level, able to tolerate her full-time job. Her abdominal wall has completely healed, no fever or chills, no diarrhea. Her weight has been stable.  MEDICAL HISTORY:  Past Medical History  Diagnosis Date  . Anemia   . Depression   . Anxiety   . JP drain bleeding   . GERD (gastroesophageal reflux disease)   . Fibromyalgia     SURGICAL HISTORY: Past Surgical History  Procedure Laterality Date  . Abdominal hysterectomy    . Gastric bypass    . Colon surgery  08/03/2015  . Laparoscopic ileocecectomy Right 08/03/2015    Procedure: LAPAROSCOPIC DIAGNOSTIC  RIGHT COLECTOMY;  Surgeon: Rolm Bookbinder, MD;  Location: [place];  Service: General;  Laterality: Right;  . Colostomy revision N/A 08/03/2015    Procedure: COLON RESECTION RIGHT;  Surgeon: Rolm Bookbinder, MD;  Location: [place];  Service: General;  Laterality: N/A;    SOCIAL HISTORY: Social History    Social History  . Marital Status: Married    Spouse Name: N/A  . Number of Children: N/A  . Years of Education: N/A   Occupational History  . Not on file.   Social History Main Topics  . Smoking status: Never Smoker   . Smokeless tobacco: Never Used  . Alcohol Use: 1.2 oz/week    2 Glasses of wine per week     Comment: moderate 1-2 times a month   . Drug Use: No  . Sexual Activity: Not on file   Other Topics Concern  . Not on file   Social History Narrative   Single, fiance [name]   Works [employer] 11:30am to 8 pm    FAMILY HISTORY: Family History  Problem Relation Age of Onset  . Cancer Father 61    small cell lung cancer and colon cancer   . Cancer Sister 50    small cell lung cancer  . Cancer Maternal Aunt     ovarian cancer  . Cancer Paternal Uncle     brain cancer   . Cancer Other 40    ovarian cancer (maternal niece)    ALLERGIES:  is allergic to latex; cymbalta; and penicillins.  MEDICATIONS:  Current Outpatient Prescriptions  Medication Sig Dispense Refill  . acetaminophen (TYLENOL) 500 MG tablet Take 1,000 mg by mouth every 6 (six) hours as needed for fever. Reported on 09/24/2015    . cyclobenzaprine (FLEXERIL) 10 MG tablet Take 1 tablet (10 mg total) by mouth 3 (three) times daily as needed for muscle spasms. 30 tablet 0  . diphenhydrAMINE (BENADRYL) 25 mg capsule Take 25 mg by mouth every 6 (six) hours as needed for sleep.    Marland Kitchen escitalopram (LEXAPRO) 10 MG tablet Take 10 mg by mouth daily.    . ferrous sulfate 325 (65 FE) MG tablet Take 325 mg by mouth 2 (two) times daily with a meal. Reported on 10/12/2015    . ondansetron (ZOFRAN) 8 MG tablet Take 1 tablet (8 mg total) by mouth as directed. Take twice daily as needed for nausea-start on day 3 of chemotherapy 30 tablet 1  . prochlorperazine (COMPAZINE) 10 MG tablet Take 1 tablet (10 mg total) by mouth every 6 (six) hours as needed for nausea or vomiting. 30 tablet 1  . traMADol  (ULTRAM) 50 MG tablet Take 1-2 tablets (50-100 mg total) by mouth every 6 (six) hours as needed. 45 tablet 1  . vitamin B-12 (CYANOCOBALAMIN) 1000 MCG tablet Take 500 mcg by mouth daily. Reported on 09/24/2015    . vitamin C (ASCORBIC ACID) 500 MG tablet Take 500 mg by mouth daily.    Marland Kitchen ALPRAZolam (XANAX) 0.25 MG tablet Take 1-2 tablets (0.25-0.5 mg total) by mouth at bedtime as needed for anxiety or sleep. 40 tablet 0  . capecitabine (XELODA) 500 MG tablet Take 3 tablets (1,500 mg total) by mouth 2 (two) times daily after a meal. Take on days 1-14 of chemotherapy. (Patient not taking: Reported on 09/22/2015) 84 tablet 1  . dexamethasone (DECADRON) 4 MG tablet Take 2 tablets (8 mg total) by mouth daily. Start day after chemo and take for 3 days  after each IV chemo (Patient not taking: Reported on 11/02/2015) 30 tablet 1  . oxyCODONE (OXY IR/ROXICODONE) 5 MG immediate release tablet Take 1 tablet (5 mg total) by mouth every 4 (four) hours as needed for severe pain. 30 tablet 0  . potassium chloride SA (K-DUR,KLOR-CON) 20 MEQ tablet Take 1 tab PO TID x 1 day; then take 1 tab PO BID till gone. (Patient not taking: Reported on 11/02/2015) 14 tablet 1   No current facility-administered medications for this visit.   Facility-Administered Medications Ordered in Other Visits  Medication Dose Route Frequency Provider Last Rate Last Dose  . clindamycin (CLEOCIN) 900 mg in dextrose 5 % 50 mL IVPB  900 mg Intravenous 60 min Pre-Op Rolm Bookbinder, MD       And  . gentamicin (GARAMYCIN) 310 mg in dextrose 5 % 50 mL IVPB  5 mg/kg Intravenous 60 min Pre-Op Rolm Bookbinder, MD        REVIEW OF SYSTEMS:   Constitutional: Denies fevers, chills or abnormal night sweats Eyes: Denies blurriness of vision, double vision or watery eyes Ears, nose, mouth, throat, and face: Denies mucositis or sore throat Respiratory: Denies cough, dyspnea or wheezes Cardiovascular: Denies palpitation, chest discomfort or lower  extremity swelling Gastrointestinal:  Denies nausea, heartburn or change in bowel habits Skin: Denies abnormal skin rashes Lymphatics: Denies new lymphadenopathy or easy bruising Neurological:Denies numbness, tingling or new weaknesses Behavioral/Psych: Mood is stable, no new changes  All other systems were reviewed with the patient and are negative.  PHYSICAL EXAMINATION: ECOG PERFORMANCE STATUS: 1 - Symptomatic but completely ambulatory  Filed Vitals:   11/02/15 0854  BP: 146/74  Pulse: 88  Temp: 98.1 F (36.7 C)  Resp: 18   Filed Weights   11/02/15 0854  Weight: 131 lb 8 oz (59.648 kg)    GENERAL:alert, no distress and comfortable SKIN: skin color, texture, turgor are normal, no rashes or significant lesions EYES: normal, conjunctiva are pink and non-injected, sclera clear OROPHARYNX:no exudate, no erythema and lips, buccal mucosa, and tongue normal  NECK: supple, thyroid normal size, non-tender, without nodularity LYMPH:  no palpable lymphadenopathy in the cervical, axillary or inguinal   LUNGS: clear to auscultation and percussion with normal breathing effort HEART: regular rate & rhythm and no murmurs and no lower extremity edema ABDOMEN:abdomen soft, non-tender and normal bowel sounds,  Her midline surgical wound has completely healed, and appears to be dry without any discharge.  Musculoskeletal:no cyanosis of digits and no clubbing  PSYCH: alert & oriented x 3 with fluent speech NEURO: no focal motor/sensory deficits  LABORATORY DATA:  I have reviewed the data as listed CBC Latest Ref Rng 11/02/2015 10/12/2015 09/29/2015  WBC 3.9 - 10.3 10e3/uL 4.7 4.5 8.4  Hemoglobin 11.6 - 15.9 g/dL 10.4(L) 9.0(L) 8.2(L)  Hematocrit 34.8 - 46.6 % 32.5(L) 29.6(L) 25.9(L)  Platelets 145 - 400 10e3/uL 220 294 281      Recent Labs  08/04/15 0346 08/06/15 0427 08/10/15 0645 08/11/15 0013  09/29/15 1135 10/12/15 0825 11/02/15 0844  NA 136 134*  --  134*  < > 132* 140 140  K  3.8 3.9  --  3.5  < > 3.7 3.6 3.7  CL 103 104  --  96*  --   --   --   --   CO2 25 21*  --  25  < > _0 GLUCOSE 157* 122*  --  104*  < > 117 92 82  BUN 5* <5*  --  7  < > 8.0 8.6 8.0  CREATININE 0.63 0.45 0.46 0.51  < > 0.6 0.7 0.7  CALCIUM 8.6* 9.0  --  8.7*  < > 8.4 8.9 9.2  GFRNONAA >60 >60 >60 >60  --   --   --   --   GFRAA >60 >60 >60 >60  --   --   --   --   PROT  --   --   --   --   < > 5.9* 7.0 6.9  ALBUMIN  --   --   --   --   < > 2.6* 3.1* 3.6  AST  --   --   --   --   < > 47* 23 20  ALT  --   --   --   --   < > 54 18 14  ALKPHOS  --   --   --   --   < > 315* 139 89  BILITOT  --   --   --   --   < > 0.33 <0.30 0.33  < > = values in this interval not displayed. CEA  Status: Finalresult Visible to patient:  MyChart Nextappt: 09/18/2015 at 09:30 AM in [facility]) Dx:  Cancer of right colon ([place])         Ref Range 2wk ago    CEA 0.0 - 4.7 ng/mL 1.3         PATHOLOGY REPORT Diagnosis 08/03/2015 Colon, segmental resection, Right - INVASIVE ADENOCARCINOMA WITH ABUNDANT EXTRACELLULAR MUCIN, MODERATELY DIFFERENTIATED, LIKELY ARISING FROM ILEOCECAL VALVE. - ADENOCARCINOMA EXTENDS AT LEAST INTO THE PERICOLONIC SOFT TISSUE AND IS ASSOCIATED WITH TRANSMURAL DEFECT. - THE PROXIMAL AND DISTAL RESECTION MARGINS ARE NEGATIVE FOR ADENOCARCINOMA. - ACELLULAR MUCIN IS GROSSLY PRESENT AT THE SEROSAL SURFACE. - LYMPHOVASCULAR INVASION IS IDENTIFIED. - METASTATIC CARCINOMA IN 1 OF 40 LYMPH NODES (1/40). - SEE ONCOLOGY TABLE BELOW. Microscopic Comment COLON AND RECTUM (INCLUDING TRANS-ANAL RESECTION): Specimen: Right colon and terminal ileum. Procedure: Resection. Tumor site: Likely ileocecal valve. Specimen integrity: Transmural defect(s). Macroscopic intactness of mesorectum: N/A Macroscopic tumor perforation: Present. Invasive tumor: Maximum size: At least 2.5 cm. Histologic type(s): Adenocarcinoma with abundant extracellular  mucin. Histologic grade and differentiation: G2: moderately differentiated. Type of polyp in which invasive carcinoma arose: Tubular adenoma. Microscopic extension of invasive tumor: Adenocarcinoma extends into pericolonic soft tissue and acellular mucin is grossly present at the serosal surface. Lymph-Vascular invasion: Present. Peri-neural invasion: Not identified. Tumor deposit(s) (discontinuous extramural extension): Not identified. Resection margins: Proximal margin: Negative for carcinoma. Distal margin: Negative for carcinoma. Circumferential (radial) (posterior ascending, posterior descending; lateral and posterior mid-rectum; and entire lower 1/3 rectum): Acellular mucin is present at the circumferential tissue edge. Treatment effect (neo-adjuvant therapy): N/A Additional polyp(s): Not identified. Non-neoplastic findings: No significant findings. Lymph nodes: number examined 40; number positive: 1 Pathologic Staging: at least pT4a, pN1a. Ancillary studies: MMR by IHC and MSI by PCR will be performed and the results reported separately. Additional studies can be performed upon clinician request.   ADDITIONAL INFORMATION: Mismatch Repair (MMR) Protein Immunohistochemistry (IHC) IHC Expression Result: MLH1: EQUIVOCAL MSH2: EQUIVOCAL MSH6: EQUIVOCAL PMS2: EQUIVOCAL * Internal control demonstrates intact nuclear expression Interpretation: EQUIVOCAL The tumor shows partial staining with all anibodies and hence the results are considered equivocal. Correlated with molecular based MSI testing is strongly recommended.    RADIOGRAPHIC STUDIES: I have personally reviewed the radiological images as listed and agreed with the findings in the report.  No results found.  ASSESSMENT & PLAN:  50 year old Caucasian female, with past medical history of fibromyalgia, depression, presented with ruptured cecum and pericolonic abscess, required multiple drainage, antibiotics, and  eventually right colectomy.  1. Right colon cancer, cecum, moderately differentiated adenocarcinoma, [id number], stage IIIA, with perforation, MSI-stable  - I reviewed her scan findings, and surgical pathology results in great details with patient and her boyfriend. -Her CT chest was negative for metastasis. - she has locally advanced stage III  Disease, with particular high risk features of T4 tumor, proliferation for 3-4 months,  And positive lymph nodes, she is at very high risk for cancer recurrence, especially peritoneal carcinomatosis. - I strongly recommend her to consider adjuvant chemotherapy, FOLFOX or CAPEOX. She opted CAPEOX,with oxaliplatin 130 mg/m, on day one, andcapecitabien 1000 mg/m (dose can be reduced to 815m/m2 if tolerance issue) twice daily, on day 1-14, every 21 day cycle, a total of 8 cycles  -She received the first dose of oxaliplatin and 1 day of Xeloda, tolerated poorly. Oxaliplatin has been held since then.- -She tolerated the second cycle chemotherapy with dose reduced Xeloda very well, she agrees to go on full dose Xeloda from cycle 3, if she does well for cycle 3 and 4, we'll consider add low-dose oxaliplatin from cycle 5. -Lab results reviewed with her, anemia improving, otherwise unremarkable, she will start cycle 3 Xeloda later this week.  2. Abdominal wound, history of abdominal abscess -The wound has completely healed now  3. Fatigue and anorexia secondary to chemotherapy therapy -She has recovered well  -I encouraged her taking nutritional supplement.   4. Anemia, iron deficient anemia  - likely secondary to surgery,  Chronic infection and GI bleeding from the tumor -Iron study today showed low serum iron, transferrin saturation and ferritin 12, which is consistent with iron deficiency -She received IV Feraheme twice, responded well. -I encouraged her to continue iron pill over-the-counter 1 tablet daily.   5. Fibromyalgia and depression - she will  continue medication, and follow-up with her primary care physician  Plan -Start cycle 3 Xeloda 15044mbid for 2 weeks on and one week off, on 4/14 -I'll see her back in 3 weeks.  All questions were answered. The patient knows to call the clinic with any problems, questions or concerns. I spent 25 minutes counseling the patient face to face. The total time spent in the appointment was 30 minutes and more than 50% was on counseling.     FeTruitt MerleMD 11/02/2015

## 2015-11-02 NOTE — Telephone Encounter (Signed)
Gave pt appt and avs for may

## 2015-11-03 LAB — CEA: CEA1: 1.7 ng/mL (ref 0.0–4.7)

## 2015-11-11 ENCOUNTER — Other Ambulatory Visit: Payer: Self-pay | Admitting: *Deleted

## 2015-11-11 DIAGNOSIS — C182 Malignant neoplasm of ascending colon: Secondary | ICD-10-CM

## 2015-11-11 MED ORDER — CAPECITABINE 500 MG PO TABS: 1000.0000 mg/m2 | ORAL_TABLET | Freq: Two times a day (BID) | ORAL | Status: DC

## 2015-11-12 ENCOUNTER — Telehealth: Payer: Self-pay | Admitting: Pharmacist

## 2015-11-12 NOTE — Telephone Encounter (Signed)
11/12/15: Attempted to reach patient for follow up on oral medication: Xeloda. No answer. Left VM for patient to call back with any questions or issues.   Thank you,  Montel Clock, PharmD, [facility] [phone]

## 2015-11-23 ENCOUNTER — Telehealth: Payer: Self-pay | Admitting: *Deleted

## 2015-11-23 ENCOUNTER — Other Ambulatory Visit: Payer: Self-pay | Admitting: Hematology

## 2015-11-23 ENCOUNTER — Telehealth: Payer: Self-pay | Admitting: Hematology

## 2015-11-23 ENCOUNTER — Encounter: Payer: Self-pay | Admitting: Hematology

## 2015-11-23 ENCOUNTER — Other Ambulatory Visit (HOSPITAL_BASED_OUTPATIENT_CLINIC_OR_DEPARTMENT_OTHER): Payer: Managed Care, Other (non HMO)

## 2015-11-23 ENCOUNTER — Ambulatory Visit (HOSPITAL_BASED_OUTPATIENT_CLINIC_OR_DEPARTMENT_OTHER): Payer: Managed Care, Other (non HMO) | Admitting: Hematology

## 2015-11-23 VITALS — BP 129/85 | HR 94 | Temp 98.0°F | Resp 18 | Ht 61.0 in | Wt 133.2 lb

## 2015-11-23 DIAGNOSIS — M791 Myalgia: Secondary | ICD-10-CM | POA: Diagnosis not present

## 2015-11-23 DIAGNOSIS — C182 Malignant neoplasm of ascending colon: Secondary | ICD-10-CM

## 2015-11-23 DIAGNOSIS — D509 Iron deficiency anemia, unspecified: Secondary | ICD-10-CM

## 2015-11-23 DIAGNOSIS — R110 Nausea: Secondary | ICD-10-CM

## 2015-11-23 DIAGNOSIS — F329 Major depressive disorder, single episode, unspecified: Secondary | ICD-10-CM

## 2015-11-23 LAB — COMPREHENSIVE METABOLIC PANEL
ALBUMIN: 3.9 g/dL (ref 3.5–5.0)
ALK PHOS: 101 U/L (ref 40–150)
ALT: 16 U/L (ref 0–55)
ANION GAP: 8 meq/L (ref 3–11)
AST: 18 U/L (ref 5–34)
BUN: 11 mg/dL (ref 7.0–26.0)
CALCIUM: 9.6 mg/dL (ref 8.4–10.4)
CHLORIDE: 107 meq/L (ref 98–109)
CO2: 24 mEq/L (ref 22–29)
Creatinine: 0.7 mg/dL (ref 0.6–1.1)
Glucose: 71 mg/dl (ref 70–140)
POTASSIUM: 3.8 meq/L (ref 3.5–5.1)
SODIUM: 139 meq/L (ref 136–145)
Total Bilirubin: <0.3 mg/dL (ref 0.20–1.20)
Total Protein: 7.3 g/dL (ref 6.4–8.3)

## 2015-11-23 LAB — CBC WITH DIFFERENTIAL/PLATELET
BASO%: 1.1 % (ref 0.0–2.0)
Basophils Absolute: 0.1 10*3/uL (ref 0.0–0.1)
EOS ABS: 0.1 10*3/uL (ref 0.0–0.5)
EOS%: 2.7 % (ref 0.0–7.0)
HCT: 36.2 % (ref 34.8–46.6)
HEMOGLOBIN: 11.7 g/dL (ref 11.6–15.9)
LYMPH%: 44.4 % (ref 14.0–49.7)
MCH: 28.4 pg (ref 25.1–34.0)
MCHC: 32.3 g/dL (ref 31.5–36.0)
MCV: 87.9 fL (ref 79.5–101.0)
MONO#: 0.4 10*3/uL (ref 0.1–0.9)
MONO%: 8.7 % (ref 0.0–14.0)
NEUT%: 43.1 % (ref 38.4–76.8)
NEUTROS ABS: 2.1 10*3/uL (ref 1.5–6.5)
PLATELETS: 223 10*3/uL (ref 145–400)
RBC: 4.11 10*6/uL (ref 3.70–5.45)
RDW: 28.3 % — AB (ref 11.2–14.5)
WBC: 4.9 10*3/uL (ref 3.9–10.3)
lymph#: 2.2 10*3/uL (ref 0.9–3.3)

## 2015-11-23 LAB — IRON AND TIBC
%SAT: 17 % — AB (ref 21–57)
Iron: 60 ug/dL (ref 41–142)
TIBC: 361 ug/dL (ref 236–444)
UIBC: 301 ug/dL (ref 120–384)

## 2015-11-23 LAB — FERRITIN: FERRITIN: 106 ng/mL (ref 9–269)

## 2015-11-23 MED ORDER — PROCHLORPERAZINE MALEATE 10 MG PO TABS: 10.0000 mg | ORAL_TABLET | Freq: Three times a day (TID) | ORAL | Status: DC | PRN

## 2015-11-23 MED ORDER — TRAMADOL HCL 50 MG PO TABS: 50.0000 mg | ORAL_TABLET | Freq: Four times a day (QID) | ORAL | Status: DC | PRN

## 2015-11-23 NOTE — Telephone Encounter (Signed)
Per staff message and POF I have scheduled appts. Advised scheduler of appts. JMW  

## 2015-11-23 NOTE — Telephone Encounter (Signed)
Gave pt appt for may & avs °

## 2015-11-23 NOTE — Progress Notes (Signed)
Introduced myself as her FA.  Informed her that unfortunately there aren't any foundations offering copay assistance for her Dx.  Pt doesn't need assistance w/ transportation or personal bills at this time.  She has my card for any questions or concerns she may have in the future.

## 2015-11-23 NOTE — Progress Notes (Signed)
[place]  [phone] [phone]  Clinic follow up Note   Patient Care Team: Carol Ada, MD as PCP - General (Family Medicine) Rolm Bookbinder, MD as Consulting Physician (General Surgery) 11/23/2015   CHIEF COMPLAINTS:  Follow up stage III colon cancer   Oncology History   Cancer of right colon [facility])   Staging form: Colon and Rectum, AJCC 7th Edition     Pathologic stage from 08/03/2015: Stage IIIB (T4a, N1a, cM0) - Signed by Truitt Merle, MD on 09/04/2015       Cancer of right colon ([place])   03/31/2015 Imaging CT abdomen showed appendicitis, perforated, with periappendiceal abscess. And small pulmonary nodules.   04/03/2015 Procedure CT guided placement of going Through into the right lower abdominal quadrant with aspiration of a total of 30 mL prudent fluid.    08/03/2015 Initial Diagnosis Cancer of right colon ([place])   08/03/2015 Surgery Right hemicolectomy   08/03/2015 Pathology Results Right hemicolectomy showed invasive adenocarcinoma, moderately differentiated, likely arising from a cecal valve. One out of 40 lymph nodes were positive, (+) LVI, margins negative    08/03/2015 Miscellaneous colon cance MSI-stable     HISTORY OF PRESENTING ILLNESS:  [name] 50 y.o. female without significant PMH is here because of her recently diagnosed stage III colon cancer. She is accompanied by her boyfriend to the clinic today.  She developed fever and headache in September 2016, was found to have ruptured appendix and was admitted to hospital. CT scan revealed a peri-appendiceal abscess. She underwent abscess drainage tube basement by interventional radiology on 04/17/2015, and a course of antibiotics. She had a multiple drainage tube exchange by IR, but the abscess did not heal. She was referred to Dr. Donne [name]. Multiple CT scan in October and November showed persistent peri-appendiceal fluid collection. She was finally taken to the operation room on  08/03/2015. Intraoperatively, she was found to have a 2 cm whole present in the cecum, she underwent ileocectomy with an anastomosis in the hepatic fixture. 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  She had wound infection issue after surgery. It has been healing slowly, she is still on wet-to-dry dressing change twice daily, has home care nurse. She has moderate pain, 6-7/10, sometime postional, she is on oxycodone 2-3 times a day. Her appetite and eating is getting better, lost about 25lb since 03/2015, no fever and chills for the past 3-4 days, just finished abx yesterday.   CURRENT THERAPY: adjuvant chemotherapy CAPEOX, capecitabine 1556m bid on Day1-14, oxaliplatin 1316mm2 on D1, every 21 days, started on 09/18/2015, plan for 8 cycles, oxaliplatin held since cycle 2 due to poor tolerance   INTERIM HISTORY:  [name] for follow-up. She tolerated her third cycle full dose Xeloda well overall. She did have mild intermittent nausea, for which she took Compazine, average once a day. She had 1 episode of vomiting. No diarrhea, or other complaints. Her appetite and energy level are fair, she is able to continue her full-time job. No fever or chills, no significant skin issue.  MEDICAL HISTORY:  Past Medical History  Diagnosis Date  . Anemia   . Depression   . Anxiety   . JP drain bleeding   . GERD (gastroesophageal reflux disease)   . Fibromyalgia     SURGICAL HISTORY: Past Surgical History  Procedure Laterality Date  . Abdominal hysterectomy    . Gastric bypass    . Colon surgery  08/03/2015  . Laparoscopic ileocecectomy Right 08/03/2015  Procedure: LAPAROSCOPIC DIAGNOSTIC RIGHT COLECTOMY;  Surgeon: Rolm Bookbinder, MD;  Location: [place];  Service: General;  Laterality: Right;  . Colostomy revision N/A 08/03/2015    Procedure: COLON RESECTION RIGHT;  Surgeon: Rolm Bookbinder, MD;  Location: [place];  Service: General;  Laterality: N/A;    SOCIAL  HISTORY: Social History   Social History  . Marital Status: Married    Spouse Name: N/A  . Number of Children: N/A  . Years of Education: N/A   Occupational History  . Not on file.   Social History Main Topics  . Smoking status: Never Smoker   . Smokeless tobacco: Never Used  . Alcohol Use: 1.2 oz/week    2 Glasses of wine per week     Comment: moderate 1-2 times a month   . Drug Use: No  . Sexual Activity: Not on file   Other Topics Concern  . Not on file   Social History Narrative   Single, fiance [name]   Works [employer] 11:30am to 8 pm    FAMILY HISTORY: Family History  Problem Relation Age of Onset  . Cancer Father 13    small cell lung cancer and colon cancer   . Cancer Sister 50    small cell lung cancer  . Cancer Maternal Aunt     ovarian cancer  . Cancer Paternal Uncle     brain cancer   . Cancer Other 40    ovarian cancer (maternal niece)    ALLERGIES:  is allergic to latex; cymbalta; and penicillins.  MEDICATIONS:  Current Outpatient Prescriptions  Medication Sig Dispense Refill  . acetaminophen (TYLENOL) 500 MG tablet Take 1,000 mg by mouth every 6 (six) hours as needed for fever. Reported on 09/24/2015    . ALPRAZolam (XANAX) 0.25 MG tablet Take 1-2 tablets (0.25-0.5 mg total) by mouth at bedtime as needed for anxiety or sleep. 40 tablet 0  . capecitabine (XELODA) 500 MG tablet Take 3 tablets (1,500 mg total) by mouth 2 (two) times daily after a meal. Take on days 1-14 of chemotherapy, then 7 days off 84 tablet 0  . cyclobenzaprine (FLEXERIL) 10 MG tablet Take 1 tablet (10 mg total) by mouth 3 (three) times daily as needed for muscle spasms. 30 tablet 0  . dexamethasone (DECADRON) 4 MG tablet Take 2 tablets (8 mg total) by mouth daily. Start day after chemo and take for 3 days after each IV chemo (Patient not taking: Reported on 11/02/2015) 30 tablet 1  . diphenhydrAMINE (BENADRYL) 25 mg capsule Take 25 mg by mouth every 6 (six) hours  as needed for sleep.    Marland Kitchen escitalopram (LEXAPRO) 10 MG tablet Take 10 mg by mouth daily.    . ferrous sulfate 325 (65 FE) MG tablet Take 325 mg by mouth 2 (two) times daily with a meal. Reported on 10/12/2015    . ondansetron (ZOFRAN) 8 MG tablet Take 1 tablet (8 mg total) by mouth as directed. Take twice daily as needed for nausea-start on day 3 of chemotherapy 30 tablet 1  . oxyCODONE (OXY IR/ROXICODONE) 5 MG immediate release tablet Take 1 tablet (5 mg total) by mouth every 4 (four) hours as needed for severe pain. 30 tablet 0  . potassium chloride SA (K-DUR,KLOR-CON) 20 MEQ tablet Take 1 tab PO TID x 1 day; then take 1 tab PO BID till gone. (Patient not taking: Reported on 11/02/2015) 14 tablet 1  . prochlorperazine (COMPAZINE) 10 MG tablet Take 1 tablet (  10 mg total) by mouth every 8 (eight) hours as needed for nausea or vomiting. 30 tablet 1  . traMADol (ULTRAM) 50 MG tablet Take 1-2 tablets (50-100 mg total) by mouth every 6 (six) hours as needed. 45 tablet 0  . vitamin B-12 (CYANOCOBALAMIN) 1000 MCG tablet Take 500 mcg by mouth daily. Reported on 09/24/2015    . vitamin C (ASCORBIC ACID) 500 MG tablet Take 500 mg by mouth daily.     No current facility-administered medications for this visit.   Facility-Administered Medications Ordered in Other Visits  Medication Dose Route Frequency Provider Last Rate Last Dose  . clindamycin (CLEOCIN) 900 mg in dextrose 5 % 50 mL IVPB  900 mg Intravenous 60 min Pre-Op Rolm Bookbinder, MD       And  . gentamicin (GARAMYCIN) 310 mg in dextrose 5 % 50 mL IVPB  5 mg/kg Intravenous 60 min Pre-Op Rolm Bookbinder, MD        REVIEW OF SYSTEMS:   Constitutional: Denies fevers, chills or abnormal night sweats Eyes: Denies blurriness of vision, double vision or watery eyes Ears, nose, mouth, throat, and face: Denies mucositis or sore throat Respiratory: Denies cough, dyspnea or wheezes Cardiovascular: Denies palpitation, chest discomfort or lower extremity  swelling Gastrointestinal:  Denies nausea, heartburn or change in bowel habits Skin: Denies abnormal skin rashes Lymphatics: Denies new lymphadenopathy or easy bruising Neurological:Denies numbness, tingling or new weaknesses Behavioral/Psych: Mood is stable, no new changes  All other systems were reviewed with the patient and are negative.  PHYSICAL EXAMINATION: ECOG PERFORMANCE STATUS: 1 - Symptomatic but completely ambulatory  Filed Vitals:   11/23/15 0913  BP: 129/85  Pulse: 94  Temp: 98 F (36.7 C)  Resp: 18   Filed Weights   11/23/15 0913  Weight: 133 lb 3.2 oz (60.419 kg)    GENERAL:alert, no distress and comfortable SKIN: skin color, texture, turgor are normal, no rashes or significant lesions EYES: normal, conjunctiva are pink and non-injected, sclera clear OROPHARYNX:no exudate, no erythema and lips, buccal mucosa, and tongue normal  NECK: supple, thyroid normal size, non-tender, without nodularity LYMPH:  no palpable lymphadenopathy in the cervical, axillary or inguinal   LUNGS: clear to auscultation and percussion with normal breathing effort HEART: regular rate & rhythm and no murmurs and no lower extremity edema ABDOMEN:abdomen soft, non-tender and normal bowel sounds,  Her midline surgical wound has completely healed, and appears to be dry without any discharge.  Musculoskeletal:no cyanosis of digits and no clubbing  PSYCH: alert & oriented x 3 with fluent speech NEURO: no focal motor/sensory deficits  LABORATORY DATA:  I have reviewed the data as listed CBC Latest Ref Rng 11/23/2015 11/02/2015 10/12/2015  WBC 3.9 - 10.3 10e3/uL 4.9 4.7 4.5  Hemoglobin 11.6 - 15.9 g/dL 11.7 10.4(L) 9.0(L)  Hematocrit 34.8 - 46.6 % 36.2 32.5(L) 29.6(L)  Platelets 145 - 400 10e3/uL 223 220 294   CMP Latest Ref Rng 11/23/2015 11/02/2015 10/12/2015  Glucose 70 - 140 mg/dl 71 82 92  BUN 7.0 - 26.0 mg/dL 11.0 8.0 8.6  Creatinine 0.6 - 1.1 mg/dL 0.7 0.7 0.7  Sodium 136 - 145 mEq/L 139  140 140  Potassium 3.5 - 5.1 mEq/L 3.8 3.7 3.6  CO2 22 - 29 mEq/L 24 24 26   Calcium 8.4 - 10.4 mg/dL 9.6 9.2 8.9  Total Protein 6.4 - 8.3 g/dL 7.3 6.9 7.0  Total Bilirubin 0.20 - 1.20 mg/dL <0.30 0.33 <0.30  Alkaline Phos 40 - 150 U/L 101 89 139  AST 5 - 34 U/L 18 20 23   ALT 0 - 55 U/L 16 14 18    Results for ELLE, VEZINA (MRN [id number]) as of 11/23/2015 10:14  Ref. Range 07/29/2015 14:28 08/20/2015 12:59 11/02/2015 08:44  CEA Latest Ref Range: 0.0-4.7 ng/mL 2.8    CEA Latest Ref Range: 0.0-4.7 ng/mL  1.3 1.7    PATHOLOGY REPORT Diagnosis 08/03/2015 Colon, segmental resection, Right - INVASIVE ADENOCARCINOMA WITH ABUNDANT EXTRACELLULAR MUCIN, MODERATELY DIFFERENTIATED, LIKELY ARISING FROM ILEOCECAL VALVE. - ADENOCARCINOMA EXTENDS AT LEAST INTO THE PERICOLONIC SOFT TISSUE AND IS ASSOCIATED WITH TRANSMURAL DEFECT. - THE PROXIMAL AND DISTAL RESECTION MARGINS ARE NEGATIVE FOR ADENOCARCINOMA. - ACELLULAR MUCIN IS GROSSLY PRESENT AT THE SEROSAL SURFACE. - LYMPHOVASCULAR INVASION IS IDENTIFIED. - METASTATIC CARCINOMA IN 1 OF 40 LYMPH NODES (1/40). - SEE ONCOLOGY TABLE BELOW. Microscopic Comment COLON AND RECTUM (INCLUDING TRANS-ANAL RESECTION): Specimen: Right colon and terminal ileum. Procedure: Resection. Tumor site: Likely ileocecal valve. Specimen integrity: Transmural defect(s). Macroscopic intactness of mesorectum: N/A Macroscopic tumor perforation: Present. Invasive tumor: Maximum size: At least 2.5 cm. Histologic type(s): Adenocarcinoma with abundant extracellular mucin. Histologic grade and differentiation: G2: moderately differentiated. Type of polyp in which invasive carcinoma arose: Tubular adenoma. Microscopic extension of invasive tumor: Adenocarcinoma extends into pericolonic soft tissue and acellular mucin is grossly present at the serosal surface. Lymph-Vascular invasion: Present. Peri-neural invasion: Not identified. Tumor deposit(s) (discontinuous extramural  extension): Not identified. Resection margins: Proximal margin: Negative for carcinoma. Distal margin: Negative for carcinoma. Circumferential (radial) (posterior ascending, posterior descending; lateral and posterior mid-rectum; and entire lower 1/3 rectum): Acellular mucin is present at the circumferential tissue edge. Treatment effect (neo-adjuvant therapy): N/A Additional polyp(s): Not identified. Non-neoplastic findings: No significant findings. Lymph nodes: number examined 40; number positive: 1 Pathologic Staging: at least pT4a, pN1a. Ancillary studies: MMR by IHC and MSI by PCR will be performed and the results reported separately. Additional studies can be performed upon clinician request.   ADDITIONAL INFORMATION: Mismatch Repair (MMR) Protein Immunohistochemistry (IHC) IHC Expression Result: MLH1: EQUIVOCAL MSH2: EQUIVOCAL MSH6: EQUIVOCAL PMS2: EQUIVOCAL * Internal control demonstrates intact nuclear expression Interpretation: EQUIVOCAL The tumor shows partial staining with all anibodies and hence the results are considered equivocal. Correlated with molecular based MSI testing is strongly recommended.    RADIOGRAPHIC STUDIES: I have personally reviewed the radiological images as listed and agreed with the findings in the report. No results found.  ASSESSMENT & PLAN:  50 year old Caucasian female, with past medical history of fibromyalgia, depression, presented with ruptured cecum and pericolonic abscess, required multiple drainage, antibiotics, and eventually right colectomy.  1. Right colon cancer, cecum, moderately differentiated adenocarcinoma, pT4N1aM0, stage IIIA, with perforation, MSI-stable  - I reviewed her scan findings, and surgical pathology results in great details with patient and her boyfriend. -Her CT chest was negative for metastasis. - she has locally advanced stage III  Disease, with particular high risk features of T4 tumor, proliferation for 3-4  months,  And positive lymph nodes, she is at very high risk for cancer recurrence, especially peritoneal carcinomatosis. - I strongly recommend her to consider adjuvant chemotherapy, FOLFOX or CAPEOX. She opted CAPEOX,with oxaliplatin 130 mg/m, on day one, andcapecitabien 1000 mg/m (dose can be reduced to 823m/m2 if tolerance issue) twice daily, on day 1-14, every 21 day cycle, a total of 8 cycles  -She received the first dose of oxaliplatin and 1 day of Xeloda, tolerated poorly. Oxaliplatin has been held since then. -She tolerated the third cycle chemotherapy with full dose Xeloda well overal  with mild nausea. she agrees to continue full dose Xeloda , and consider restart oxaliplatin from cycle 5 -Lab results reviewed with her, anemia improving, otherwise unremarkable, she will start cycle 4 Xeloda on 5/6  2. Abdominal wound, history of abdominal abscess -The wound has completely healed now  3. Nausea from chemotherapy therapy -She will continue zofran and compazine as needed   4. Anemia, iron deficient anemia  - likely secondary to surgery,  Chronic infection and GI bleeding from the tumor -Iron study today showed low serum iron, transferrin saturation and ferritin 12, which is consistent with iron deficiency -She received IV Feraheme twice, responded well. -I encouraged her to continue iron pill over-the-counter 1 tablet twice daily.   5. Fibromyalgia and depression - she will continue medication, and follow-up with her primary care physician  Plan -Start cycle 4 Xeloda 1511m bid for 2 weeks on and one week off, on 5/6 -I'll see her back on 5/26  All questions were answered. The patient knows to call the clinic with any problems, questions or concerns. I spent 25 minutes counseling the patient face to face. The total time spent in the appointment was 30 minutes and more than 50% was on counseling.     FTruitt Merle MD 11/23/2015

## 2015-11-23 NOTE — Telephone Encounter (Signed)
per pof to sch pt appt-sent MW email to sch pt trmt-pt to get update on MY CHART

## 2015-11-26 ENCOUNTER — Telehealth: Payer: Self-pay | Admitting: Pharmacist

## 2015-11-26 NOTE — Telephone Encounter (Signed)
Oral Chemotherapy Follow-Up Form  Original Start date of oral chemotherapy: 09/18/15  Called patient today to follow up regarding patient's oral chemotherapy medication: Xeloda  Patient stated that she has noticed one patch of redness on her hands that went away with moisturizing lotion. Fatigue -comes and goes. She does have some trouble sleeping and was given xanax rx which is helping.  Pt reports 1 dose missed in the last week.   Pt reports the following side effects: trouble sleeping - waking up a lot, some fatigue.  She is scheduled to start her next cycle on 11/28/15 Will follow up and call patient again in 3 weeks when she is here to see Dr. Burr Medico on 12/18/15. Oxaliplatin may be added back to regimen at this visit (with a dose decrease per patient report).  Thank you, Raul Del, PharmD, BCPS, BCOP Oral chemotherapy clinic [phone]

## 2015-12-07 ENCOUNTER — Other Ambulatory Visit: Payer: Self-pay | Admitting: *Deleted

## 2015-12-07 DIAGNOSIS — C182 Malignant neoplasm of ascending colon: Secondary | ICD-10-CM

## 2015-12-07 MED ORDER — CAPECITABINE 500 MG PO TABS: 1000.0000 mg/m2 | ORAL_TABLET | Freq: Two times a day (BID) | ORAL | Status: DC

## 2015-12-11 ENCOUNTER — Other Ambulatory Visit: Payer: Managed Care, Other (non HMO)

## 2015-12-11 ENCOUNTER — Ambulatory Visit: Payer: Managed Care, Other (non HMO)

## 2015-12-11 ENCOUNTER — Ambulatory Visit: Payer: Managed Care, Other (non HMO) | Admitting: Nurse Practitioner

## 2015-12-18 ENCOUNTER — Encounter: Payer: Self-pay | Admitting: *Deleted

## 2015-12-18 ENCOUNTER — Encounter: Payer: Self-pay | Admitting: Hematology

## 2015-12-18 ENCOUNTER — Ambulatory Visit (HOSPITAL_BASED_OUTPATIENT_CLINIC_OR_DEPARTMENT_OTHER): Payer: Managed Care, Other (non HMO) | Admitting: Hematology

## 2015-12-18 ENCOUNTER — Other Ambulatory Visit: Payer: Self-pay | Admitting: *Deleted

## 2015-12-18 ENCOUNTER — Ambulatory Visit (HOSPITAL_BASED_OUTPATIENT_CLINIC_OR_DEPARTMENT_OTHER): Payer: Managed Care, Other (non HMO)

## 2015-12-18 ENCOUNTER — Other Ambulatory Visit (HOSPITAL_BASED_OUTPATIENT_CLINIC_OR_DEPARTMENT_OTHER): Payer: Managed Care, Other (non HMO)

## 2015-12-18 ENCOUNTER — Telehealth: Payer: Self-pay | Admitting: Hematology

## 2015-12-18 VITALS — BP 144/84 | HR 76 | Temp 98.2°F | Resp 18 | Ht 61.0 in | Wt 134.5 lb

## 2015-12-18 DIAGNOSIS — D509 Iron deficiency anemia, unspecified: Secondary | ICD-10-CM

## 2015-12-18 DIAGNOSIS — C182 Malignant neoplasm of ascending colon: Secondary | ICD-10-CM

## 2015-12-18 DIAGNOSIS — C180 Malignant neoplasm of cecum: Secondary | ICD-10-CM | POA: Diagnosis not present

## 2015-12-18 DIAGNOSIS — D649 Anemia, unspecified: Secondary | ICD-10-CM

## 2015-12-18 DIAGNOSIS — C779 Secondary and unspecified malignant neoplasm of lymph node, unspecified: Secondary | ICD-10-CM

## 2015-12-18 DIAGNOSIS — Z5111 Encounter for antineoplastic chemotherapy: Secondary | ICD-10-CM

## 2015-12-18 DIAGNOSIS — F329 Major depressive disorder, single episode, unspecified: Secondary | ICD-10-CM

## 2015-12-18 DIAGNOSIS — R110 Nausea: Secondary | ICD-10-CM

## 2015-12-18 DIAGNOSIS — M797 Fibromyalgia: Secondary | ICD-10-CM

## 2015-12-18 LAB — CBC WITH DIFFERENTIAL/PLATELET
BASO%: 0.4 % (ref 0.0–2.0)
BASOS ABS: 0 10*3/uL (ref 0.0–0.1)
EOS ABS: 0.1 10*3/uL (ref 0.0–0.5)
EOS%: 2.1 % (ref 0.0–7.0)
HCT: 36.9 % (ref 34.8–46.6)
HGB: 12.4 g/dL (ref 11.6–15.9)
LYMPH%: 34.1 % (ref 14.0–49.7)
MCH: 30.5 pg (ref 25.1–34.0)
MCHC: 33.6 g/dL (ref 31.5–36.0)
MCV: 90.7 fL (ref 79.5–101.0)
MONO#: 0.5 10*3/uL (ref 0.1–0.9)
MONO%: 9 % (ref 0.0–14.0)
NEUT%: 54.4 % (ref 38.4–76.8)
NEUTROS ABS: 2.9 10*3/uL (ref 1.5–6.5)
Platelets: 185 10*3/uL (ref 145–400)
RBC: 4.07 10*6/uL (ref 3.70–5.45)
RDW: 21 % — ABNORMAL HIGH (ref 11.2–14.5)
WBC: 5.3 10*3/uL (ref 3.9–10.3)
lymph#: 1.8 10*3/uL (ref 0.9–3.3)

## 2015-12-18 LAB — COMPREHENSIVE METABOLIC PANEL
ALBUMIN: 4.1 g/dL (ref 3.5–5.0)
ALT: 9 U/L (ref 0–55)
AST: 14 U/L (ref 5–34)
Alkaline Phosphatase: 99 U/L (ref 40–150)
Anion Gap: 11 mEq/L (ref 3–11)
BUN: 9.9 mg/dL (ref 7.0–26.0)
CALCIUM: 9.8 mg/dL (ref 8.4–10.4)
CHLORIDE: 107 meq/L (ref 98–109)
CO2: 24 mEq/L (ref 22–29)
CREATININE: 0.7 mg/dL (ref 0.6–1.1)
EGFR: >90 mL/min/{1.73_m2} (ref >90)
Glucose: 83 mg/dl (ref 70–140)
Potassium: 3.9 mEq/L (ref 3.5–5.1)
Sodium: 142 mEq/L (ref 136–145)
TOTAL PROTEIN: 7.4 g/dL (ref 6.4–8.3)
Total Bilirubin: 0.37 mg/dL (ref 0.20–1.20)

## 2015-12-18 LAB — IRON AND TIBC
%SAT: 19 % — ABNORMAL LOW (ref 21–57)
Iron: 68 ug/dL (ref 41–142)
TIBC: 363 ug/dL (ref 236–444)
UIBC: 294 ug/dL (ref 120–384)

## 2015-12-18 LAB — FERRITIN: FERRITIN: 56 ng/mL (ref 9–269)

## 2015-12-18 MED ORDER — DEXTROSE 5 % IV SOLN
Freq: Once | INTRAVENOUS | Status: AC
  Administered 2015-12-18: 10:00:00 via INTRAVENOUS

## 2015-12-18 MED ORDER — OXALIPLATIN CHEMO INJECTION 100 MG/20ML
62.0000 mg/m2 | Freq: Once | INTRAVENOUS | Status: AC
  Administered 2015-12-18: 100 mg via INTRAVENOUS
  Filled 2015-12-18: qty 20

## 2015-12-18 MED ORDER — OXYCODONE HCL 5 MG PO TABS: 5.0000 mg | ORAL_TABLET | ORAL | Status: DC | PRN

## 2015-12-18 MED ORDER — PALONOSETRON HCL INJECTION 0.25 MG/5ML
INTRAVENOUS | Status: AC
Start: 2015-12-18 — End: 2015-12-18
  Filled 2015-12-18: qty 5

## 2015-12-18 MED ORDER — ALPRAZOLAM 0.25 MG PO TABS: 0.2500 mg | ORAL_TABLET | Freq: Every evening | ORAL | Status: DC | PRN

## 2015-12-18 MED ORDER — SODIUM CHLORIDE 0.9 % IV SOLN
10.0000 mg | Freq: Once | INTRAVENOUS | Status: AC
  Administered 2015-12-18: 10 mg via INTRAVENOUS
  Filled 2015-12-18: qty 1

## 2015-12-18 MED ORDER — PROCHLORPERAZINE MALEATE 10 MG PO TABS: 10.0000 mg | ORAL_TABLET | Freq: Three times a day (TID) | ORAL | Status: DC | PRN

## 2015-12-18 MED ORDER — TRAMADOL HCL 50 MG PO TABS: 50.0000 mg | ORAL_TABLET | Freq: Four times a day (QID) | ORAL | Status: DC | PRN

## 2015-12-18 MED ORDER — PALONOSETRON HCL INJECTION 0.25 MG/5ML
0.2500 mg | Freq: Once | INTRAVENOUS | Status: AC
  Administered 2015-12-18: 0.25 mg via INTRAVENOUS

## 2015-12-18 MED ORDER — UREA 10 % EX CREA: TOPICAL_CREAM | CUTANEOUS | Status: DC | PRN

## 2015-12-18 NOTE — Progress Notes (Signed)
Oncology Nurse Navigator Documentation  Oncology Nurse Navigator Flowsheets 12/18/2015  Navigator Location [facility]  Navigator Encounter Type Treatment  Telephone -  Abnormal Finding Date 04/02/2015  Confirmed Diagnosis Date 08/03/2015  Surgery Date -  Treatment Initiated Date -  Patient Visit Type MedOnc  Treatment Phase Active Tx--CAPEOX  Barriers/Navigation Needs Education--insomnia  Education  Symptom Management--insomnia and good sleep hygiene practices to follow  Interventions Education Method  Coordination of Care -  Education Method Written;Verbal  Support Groups/Services Other--[facility] Calendar  Acuity Level 1  Time Spent with Patient 15  [name] continues to work and is having minimal side effects from her chemotherapy. Feels she is doing well emotionally also.

## 2015-12-18 NOTE — Telephone Encounter (Signed)
Gave adn printed June sched

## 2015-12-18 NOTE — Patient Instructions (Signed)
Oxaliplatin Injection What is this medicine? OXALIPLATIN (ox AL i PLA tin) is a chemotherapy drug. It targets fast dividing cells, like cancer cells, and causes these cells to die. This medicine is used to treat cancers of the colon and rectum, and many other cancers. This medicine may be used for other purposes; ask your health care provider or pharmacist if you have questions. What should I tell my health care provider before I take this medicine? They need to know if you have any of these conditions: -kidney disease -an unusual or allergic reaction to oxaliplatin, other chemotherapy, other medicines, foods, dyes, or preservatives -pregnant or trying to get pregnant -breast-feeding How should I use this medicine? This drug is given as an infusion into a vein. It is administered in a hospital or clinic by a specially trained health care professional. Talk to your pediatrician regarding the use of this medicine in children. Special care may be needed. Overdosage: If you think you have taken too much of this medicine contact a poison control center or emergency room at once. NOTE: This medicine is only for you. Do not share this medicine with others. What if I miss a dose? It is important not to miss a dose. Call your doctor or health care professional if you are unable to keep an appointment. What may interact with this medicine? -medicines to increase blood counts like filgrastim, pegfilgrastim, sargramostim -probenecid -some antibiotics like amikacin, gentamicin, neomycin, polymyxin B, streptomycin, tobramycin -zalcitabine Talk to your doctor or health care professional before taking any of these medicines: -acetaminophen -aspirin -ibuprofen -ketoprofen -naproxen This list may not describe all possible interactions. Give your health care provider a list of all the medicines, herbs, non-prescription drugs, or dietary supplements you use. Also tell them if you smoke, drink alcohol, or use  illegal drugs. Some items may interact with your medicine. What should I watch for while using this medicine? Your condition will be monitored carefully while you are receiving this medicine. You will need important blood work done while you are taking this medicine. This medicine can make you more sensitive to cold. Do not drink cold drinks or use ice. Cover exposed skin before coming in contact with cold temperatures or cold objects. When out in cold weather wear warm clothing and cover your mouth and nose to warm the air that goes into your lungs. Tell your doctor if you get sensitive to the cold. This drug may make you feel generally unwell. This is not uncommon, as chemotherapy can affect healthy cells as well as cancer cells. Report any side effects. Continue your course of treatment even though you feel ill unless your doctor tells you to stop. In some cases, you may be given additional medicines to help with side effects. Follow all directions for their use. Call your doctor or health care professional for advice if you get a fever, chills or sore throat, or other symptoms of a cold or flu. Do not treat yourself. This drug decreases your body's ability to fight infections. Try to avoid being around people who are sick. This medicine may increase your risk to bruise or bleed. Call your doctor or health care professional if you notice any unusual bleeding. Be careful brushing and flossing your teeth or using a toothpick because you may get an infection or bleed more easily. If you have any dental work done, tell your dentist you are receiving this medicine. Avoid taking products that contain aspirin, acetaminophen, ibuprofen, naproxen, or ketoprofen unless instructed  may get an infection or bleed more easily. If you have any dental work done, tell your dentist you are receiving this medicine.  Avoid taking products that contain aspirin, acetaminophen, ibuprofen, naproxen, or ketoprofen unless instructed by your doctor. These medicines may hide a fever.  Do not become pregnant while taking this medicine. Women should inform their doctor if they wish to become pregnant or think they might be pregnant. There is a potential for serious side  effects to an unborn child. Talk to your health care professional or pharmacist for more information. Do not breast-feed an infant while taking this medicine.  Call your doctor or health care professional if you get diarrhea. Do not treat yourself.  What side effects may I notice from receiving this medicine?  Side effects that you should report to your doctor or health care professional as soon as possible:  -allergic reactions like skin rash, itching or hives, swelling of the face, lips, or tongue  -low blood counts - This drug may decrease the number of white blood cells, red blood cells and platelets. You may be at increased risk for infections and bleeding.  -signs of infection - fever or chills, cough, sore throat, pain or difficulty passing urine  -signs of decreased platelets or bleeding - bruising, pinpoint red spots on the skin, black, tarry stools, nosebleeds  -signs of decreased red blood cells - unusually weak or tired, fainting spells, lightheadedness  -breathing problems  -chest pain, pressure  -cough  -diarrhea  -jaw tightness  -mouth sores  -nausea and vomiting  -pain, swelling, redness or irritation at the injection site  -pain, tingling, numbness in the hands or feet  -problems with balance, talking, walking  -redness, blistering, peeling or loosening of the skin, including inside the mouth  -trouble passing urine or change in the amount of urine  Side effects that usually do not require medical attention (report to your doctor or health care professional if they continue or are bothersome):  -changes in vision  -constipation  -hair loss  -loss of appetite  -metallic taste in the mouth or changes in taste  -stomach pain  This list may not describe all possible side effects. Call your doctor for medical advice about side effects. You may report side effects to FDA at 1-800-FDA-1088.  Where should I keep my medicine?  This drug is given in a hospital or clinic and will not be stored at home.  NOTE:  This sheet is a summary. It may not cover all possible information. If you have questions about this medicine, talk to your doctor, pharmacist, or health care provider.      2016, Elsevier/Gold Standard. (2008-02-05 17:22:47)

## 2015-12-18 NOTE — Progress Notes (Addendum)
[place]  [phone] [phone]  Clinic follow up Note   Patient Care Team: Carol Ada, MD as PCP - General (Family Medicine) Rolm Bookbinder, MD as Consulting Physician (General Surgery) 12/18/2015   CHIEF COMPLAINTS:  Follow up stage III colon cancer   Oncology History   Cancer of right colon [facility])   Staging form: Colon and Rectum, AJCC 7th Edition     Pathologic stage from 08/03/2015: Stage IIIB (T4a, N1a, cM0) - Signed by Truitt Merle, MD on 09/04/2015       Cancer of right colon ([place])   03/31/2015 Imaging CT abdomen showed appendicitis, perforated, with periappendiceal abscess. And small pulmonary nodules.   04/03/2015 Procedure CT guided placement of going Through into the right lower abdominal quadrant with aspiration of a total of 30 mL prudent fluid.    08/03/2015 Initial Diagnosis Cancer of right colon ([place])   08/03/2015 Surgery Right hemicolectomy   08/03/2015 Pathology Results Right hemicolectomy showed invasive adenocarcinoma, moderately differentiated, likely arising from a cecal valve. One out of 40 lymph nodes were positive, (+) LVI, margins negative    08/03/2015 Miscellaneous colon cance MSI-stable     HISTORY OF PRESENTING ILLNESS:  [name] 50 y.o. female without significant PMH is here because of her recently diagnosed stage III colon cancer. She is accompanied by her boyfriend to the clinic today.  She developed fever and headache in September 2016, was found to have ruptured appendix and was admitted to hospital. CT scan revealed a peri-appendiceal abscess. She underwent abscess drainage tube basement by interventional radiology on 04/17/2015, and a course of antibiotics. She had a multiple drainage tube exchange by IR, but the abscess did not heal. She was referred to Dr. Donne [name]. Multiple CT scan in October and November showed persistent peri-appendiceal fluid collection. She was finally taken to the operation room on  08/03/2015. Intraoperatively, she was found to have a 2 cm whole present in the cecum, she underwent ileocectomy with an anastomosis in the hepatic fixture. 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  She had wound infection issue after surgery. It has been healing slowly, she is still on wet-to-dry dressing change twice daily, has home care nurse. She has moderate pain, 6-7/10, sometime postional, she is on oxycodone 2-3 times a day. Her appetite and eating is getting better, lost about 25lb since 03/2015, no fever and chills for the past 3-4 days, just finished abx yesterday.   CURRENT THERAPY: adjuvant chemotherapy CAPEOX, capecitabine 1540m bid on Day1-14, oxaliplatin 1367mm2 on D1, every 21 days, started on 09/18/2015, plan for 8 cycles, oxaliplatin was held from cycle 2 to 4 due to poor tolerance, restarted from cycle 5 with lower dose    INTERIM HISTORY:  RoAerikaeturns for follow-up. She tolerated her 4th cycle full dose Xeloda well overall. She has a few episodes of mild nausea, no vomiting, she took antiemetic medication. She otherwise tolerated well, works full-time, no other complaints. She notices her hand and bottom of feet has been red, slightly sensitive, no skin cracking or peeling. No numbness or tingling.  MEDICAL HISTORY:  Past Medical History  Diagnosis Date  . Anemia   . Depression   . Anxiety   . JP drain bleeding   . GERD (gastroesophageal reflux disease)   . Fibromyalgia     SURGICAL HISTORY: Past Surgical History  Procedure Laterality Date  . Abdominal hysterectomy    . Gastric bypass    . Colon surgery  08/03/2015  . Laparoscopic ileocecectomy Right 08/03/2015    Procedure: LAPAROSCOPIC DIAGNOSTIC RIGHT COLECTOMY;  Surgeon: Rolm Bookbinder, MD;  Location: [place];  Service: General;  Laterality: Right;  . Colostomy revision N/A 08/03/2015    Procedure: COLON RESECTION RIGHT;  Surgeon: Rolm Bookbinder, MD;  Location: [place];  Service: General;   Laterality: N/A;    SOCIAL HISTORY: Social History   Social History  . Marital Status: Married    Spouse Name: N/A  . Number of Children: N/A  . Years of Education: N/A   Occupational History  . Not on file.   Social History Main Topics  . Smoking status: Never Smoker   . Smokeless tobacco: Never Used  . Alcohol Use: 1.2 oz/week    2 Glasses of wine per week     Comment: moderate 1-2 times a month   . Drug Use: No  . Sexual Activity: Not on file   Other Topics Concern  . Not on file   Social History Narrative   Single, fiance [name]   Works [employer] 11:30am to 8 pm    FAMILY HISTORY: Family History  Problem Relation Age of Onset  . Cancer Father 75    small cell lung cancer and colon cancer   . Cancer Sister 50    small cell lung cancer  . Cancer Maternal Aunt     ovarian cancer  . Cancer Paternal Uncle     brain cancer   . Cancer Other 40    ovarian cancer (maternal niece)    ALLERGIES:  is allergic to latex; cymbalta; and penicillins.  MEDICATIONS:  Current Outpatient Prescriptions  Medication Sig Dispense Refill  . acetaminophen (TYLENOL) 500 MG tablet Take 1,000 mg by mouth every 6 (six) hours as needed for fever. Reported on 09/24/2015    . capecitabine (XELODA) 500 MG tablet Take 3 tablets (1,500 mg total) by mouth 2 (two) times daily after a meal. Take on days 1-14 of chemotherapy, then 7 days off 84 tablet 3  . cyclobenzaprine (FLEXERIL) 10 MG tablet Take 1 tablet (10 mg total) by mouth 3 (three) times daily as needed for muscle spasms. 30 tablet 0  . dexamethasone (DECADRON) 4 MG tablet Take 2 tablets (8 mg total) by mouth daily. Start day after chemo and take for 3 days after each IV chemo 30 tablet 1  . diphenhydrAMINE (BENADRYL) 25 mg capsule Take 25 mg by mouth every 6 (six) hours as needed for sleep.    Marland Kitchen escitalopram (LEXAPRO) 10 MG tablet Take 10 mg by mouth daily.    . ferrous sulfate 325 (65 FE) MG tablet Take 325 mg by mouth  2 (two) times daily with a meal. Reported on 10/12/2015    . ondansetron (ZOFRAN) 8 MG tablet Take 1 tablet (8 mg total) by mouth as directed. Take twice daily as needed for nausea-start on day 3 of chemotherapy 30 tablet 1  . potassium chloride SA (K-DUR,KLOR-CON) 20 MEQ tablet Take 1 tab PO TID x 1 day; then take 1 tab PO BID till gone. 14 tablet 1  . vitamin B-12 (CYANOCOBALAMIN) 1000 MCG tablet Take 500 mcg by mouth daily. Reported on 09/24/2015    . vitamin C (ASCORBIC ACID) 500 MG tablet Take 500 mg by mouth daily.    Marland Kitchen ALPRAZolam (XANAX) 0.25 MG tablet Take 1-2 tablets (0.25-0.5 mg total) by mouth at bedtime as needed for anxiety or sleep. 40 tablet 0  . oxyCODONE (OXY IR/ROXICODONE) 5  MG immediate release tablet Take 1 tablet (5 mg total) by mouth every 4 (four) hours as needed for severe pain. 60 tablet 0  . prochlorperazine (COMPAZINE) 10 MG tablet Take 1 tablet (10 mg total) by mouth every 8 (eight) hours as needed for nausea or vomiting. 30 tablet 1  . traMADol (ULTRAM) 50 MG tablet Take 1-2 tablets (50-100 mg total) by mouth every 6 (six) hours as needed. 45 tablet 0  . urea (CARMOL) 10 % cream Apply topically as needed. 71 g 1   No current facility-administered medications for this visit.   Facility-Administered Medications Ordered in Other Visits  Medication Dose Route Frequency Provider Last Rate Last Dose  . clindamycin (CLEOCIN) 900 mg in dextrose 5 % 50 mL IVPB  900 mg Intravenous 60 min Pre-Op Rolm Bookbinder, MD       And  . gentamicin (GARAMYCIN) 310 mg in dextrose 5 % 50 mL IVPB  5 mg/kg Intravenous 60 min Pre-Op Rolm Bookbinder, MD        REVIEW OF SYSTEMS:   Constitutional: Denies fevers, chills or abnormal night sweats Eyes: Denies blurriness of vision, double vision or watery eyes Ears, nose, mouth, throat, and face: Denies mucositis or sore throat Respiratory: Denies cough, dyspnea or wheezes Cardiovascular: Denies palpitation, chest discomfort or lower extremity  swelling Gastrointestinal:  Denies nausea, heartburn or change in bowel habits Skin: Denies abnormal skin rashes Lymphatics: Denies new lymphadenopathy or easy bruising Neurological:Denies numbness, tingling or new weaknesses Behavioral/Psych: Mood is stable, no new changes  All other systems were reviewed with the patient and are negative.  PHYSICAL EXAMINATION: ECOG PERFORMANCE STATUS: 1 - Symptomatic but completely ambulatory  Filed Vitals:   12/18/15 0914  BP: 144/84  Pulse: 76  Temp: 98.2 F (36.8 C)  Resp: 18   Filed Weights   12/18/15 0914  Weight: 134 lb 8 oz (61.009 kg)    GENERAL:alert, no distress and comfortable SKIN: skin color, texture, turgor are normal, no rashes or significant lesions EYES: normal, conjunctiva are pink and non-injected, sclera clear OROPHARYNX:no exudate, no erythema and lips, buccal mucosa, and tongue normal  NECK: supple, thyroid normal size, non-tender, without nodularity LYMPH:  no palpable lymphadenopathy in the cervical, axillary or inguinal   LUNGS: clear to auscultation and percussion with normal breathing effort HEART: regular rate & rhythm and no murmurs and no lower extremity edema ABDOMEN:abdomen soft, non-tender and normal bowel sounds,  Her midline surgical wound has completely healed, and appears to be dry without any discharge.  Musculoskeletal:no cyanosis of digits and no clubbing  PSYCH: alert & oriented x 3 with fluent speech NEURO: no focal motor/sensory deficits  LABORATORY DATA:  I have reviewed the data as listed CBC Latest Ref Rng 12/18/2015 11/23/2015 11/02/2015  WBC 3.9 - 10.3 10e3/uL 5.3 4.9 4.7  Hemoglobin 11.6 - 15.9 g/dL 12.4 11.7 10.4(L)  Hematocrit 34.8 - 46.6 % 36.9 36.2 32.5(L)  Platelets 145 - 400 10e3/uL 185 223 220   CMP Latest Ref Rng 12/18/2015 11/23/2015 11/02/2015  Glucose 70 - 140 mg/dl 83 71 82  BUN 7.0 - 26.0 mg/dL 9.9 11.0 8.0  Creatinine 0.6 - 1.1 mg/dL 0.7 0.7 0.7  Sodium 136 - 145 mEq/L 142 139  140  Potassium 3.5 - 5.1 mEq/L 3.9 3.8 3.7  CO2 22 - 29 mEq/L _0 Calcium 8.4 - 10.4 mg/dL 9.8 9.6 9.2  Total Protein 6.4 - 8.3 g/dL 7.4 7.3 6.9  Total Bilirubin 0.20 - 1.20 mg/dL 0.37 <  0.30 0.33  Alkaline Phos 40 - 150 U/L 99 101 89  AST 5 - 34 U/L _0 ALT 0 - 55 U/L _1 Results for LANIYAH, ROSENWALD (MRN [id number]) as of 11/23/2015 10:14  Ref. Range 07/29/2015 14:28 08/20/2015 12:59 11/02/2015 08:44  CEA Latest Ref Range: 0.0-4.7 ng/mL 2.8    CEA Latest Ref Range: 0.0-4.7 ng/mL  1.3 1.7    PATHOLOGY REPORT Diagnosis 08/03/2015 Colon, segmental resection, Right - INVASIVE ADENOCARCINOMA WITH ABUNDANT EXTRACELLULAR MUCIN, MODERATELY DIFFERENTIATED, LIKELY ARISING FROM ILEOCECAL VALVE. - ADENOCARCINOMA EXTENDS AT LEAST INTO THE PERICOLONIC SOFT TISSUE AND IS ASSOCIATED WITH TRANSMURAL DEFECT. - THE PROXIMAL AND DISTAL RESECTION MARGINS ARE NEGATIVE FOR ADENOCARCINOMA. - ACELLULAR MUCIN IS GROSSLY PRESENT AT THE SEROSAL SURFACE. - LYMPHOVASCULAR INVASION IS IDENTIFIED. - METASTATIC CARCINOMA IN 1 OF 40 LYMPH NODES (1/40). - SEE ONCOLOGY TABLE BELOW. Microscopic Comment COLON AND RECTUM (INCLUDING TRANS-ANAL RESECTION): Specimen: Right colon and terminal ileum. Procedure: Resection. Tumor site: Likely ileocecal valve. Specimen integrity: Transmural defect(s). Macroscopic intactness of mesorectum: N/A Macroscopic tumor perforation: Present. Invasive tumor: Maximum size: At least 2.5 cm. Histologic type(s): Adenocarcinoma with abundant extracellular mucin. Histologic grade and differentiation: G2: moderately differentiated. Type of polyp in which invasive carcinoma arose: Tubular adenoma. Microscopic extension of invasive tumor: Adenocarcinoma extends into pericolonic soft tissue and acellular mucin is grossly present at the serosal surface. Lymph-Vascular invasion: Present. Peri-neural invasion: Not identified. Tumor deposit(s) (discontinuous extramural extension): Not  identified. Resection margins: Proximal margin: Negative for carcinoma. Distal margin: Negative for carcinoma. Circumferential (radial) (posterior ascending, posterior descending; lateral and posterior mid-rectum; and entire lower 1/3 rectum): Acellular mucin is present at the circumferential tissue edge. Treatment effect (neo-adjuvant therapy): N/A Additional polyp(s): Not identified. Non-neoplastic findings: No significant findings. Lymph nodes: number examined 40; number positive: 1 Pathologic Staging: at least pT4a, pN1a. Ancillary studies: MMR by IHC and MSI by PCR will be performed and the results reported separately. Additional studies can be performed upon clinician request.   ADDITIONAL INFORMATION: Mismatch Repair (MMR) Protein Immunohistochemistry (IHC) IHC Expression Result: MLH1: EQUIVOCAL MSH2: EQUIVOCAL MSH6: EQUIVOCAL PMS2: EQUIVOCAL * Internal control demonstrates intact nuclear expression Interpretation: EQUIVOCAL The tumor shows partial staining with all anibodies and hence the results are considered equivocal. Correlated with molecular based MSI testing is strongly recommended.    RADIOGRAPHIC STUDIES: I have personally reviewed the radiological images as listed and agreed with the findings in the report. No results found.  ASSESSMENT & PLAN:  50 year old Caucasian female, with past medical history of fibromyalgia, depression, presented with ruptured cecum and pericolonic abscess, required multiple drainage, antibiotics, and eventually right colectomy.  1. Right colon cancer, cecum, moderately differentiated adenocarcinoma, pT4N1aM0, stage IIIA, with perforation, MSI-stable  - I reviewed her scan findings, and surgical pathology results in great details with patient and her boyfriend. -Her CT chest was negative for metastasis. - she has locally advanced stage III  Disease, with particular high risk features of T4 tumor, proliferation for 3-4 months,  And  positive lymph nodes, she is at very high risk for cancer recurrence, especially peritoneal carcinomatosis. - I strongly recommend her to consider adjuvant chemotherapy, FOLFOX or CAPEOX. She opted CAPEOX,with oxaliplatin 130 mg/m, on day one, andcapecitabien 1000 mg/m twice daily, on day 1-14, every 21 day cycle, a total of 8 cycles  -She received the first dose of oxaliplatin and 1 day of Xeloda, tolerated poorly. Oxaliplatin has been held since then. -She tolerated cycle 3-4 chemotherapy with full dose  Xeloda well overal with mild nausea. she agrees to continue full dose Xeloda , and consider restart oxaliplatin from cycle 5 -Restart oxaliplatin today, with 50% dose reduction, she is not comfortable with higher dose  -Lab results reviewed with her, anemia improving, otherwise unremarkable, she will start cycle 5 CAPEOX   2. Abdominal wound, history of abdominal abscess -The wound has completely healed now  3. Nausea from chemotherapy therapy -She will continue zofran and compazine as needed   4. Anemia, iron deficient anemia  - likely secondary to surgery,  Chronic infection and GI bleeding from the tumor -Iron study today showed low serum iron, transferrin saturation and ferritin 12, which is consistent with iron deficiency -She received IV Feraheme twice, responded well. Anemia resolved and now -I encouraged her to continue iron pill over-the-counter 1 tablet twice daily.   5. Fibromyalgia and depression - she will continue medication, and follow-up with her primary care physician  Plan -Start cycle 5 Xeloda 1564m bid for 2 weeks on and one week off today, and oxaliplatin 65 mg/m today (50% dose reduction). If tolerates well, will increase oxaliplatin to 988mm2 (30% reduction)  -I'll see her back in 3 weeks before next cycle chemotherapy.  All questions were answered. The patient knows to call the clinic with any problems, questions or concerns. I spent 25 minutes counseling the  patient face to face. The total time spent in the appointment was 30 minutes and more than 50% was on counseling.     FeTruitt MerleMD 12/18/2015

## 2015-12-19 LAB — CEA: CEA: 2.1 ng/mL (ref 0.0–4.7)

## 2016-01-05 ENCOUNTER — Other Ambulatory Visit: Payer: Self-pay | Admitting: Hematology

## 2016-01-05 ENCOUNTER — Ambulatory Visit (HOSPITAL_BASED_OUTPATIENT_CLINIC_OR_DEPARTMENT_OTHER): Payer: Managed Care, Other (non HMO) | Admitting: Nurse Practitioner

## 2016-01-05 ENCOUNTER — Other Ambulatory Visit: Payer: Self-pay | Admitting: *Deleted

## 2016-01-05 ENCOUNTER — Encounter: Payer: Self-pay | Admitting: Hematology

## 2016-01-05 ENCOUNTER — Telehealth: Payer: Self-pay

## 2016-01-05 VITALS — BP 126/84 | HR 84 | Temp 98.5°F | Resp 18 | Ht 61.0 in | Wt 131.9 lb

## 2016-01-05 DIAGNOSIS — C182 Malignant neoplasm of ascending colon: Secondary | ICD-10-CM

## 2016-01-05 DIAGNOSIS — L271 Localized skin eruption due to drugs and medicaments taken internally: Secondary | ICD-10-CM | POA: Diagnosis not present

## 2016-01-05 NOTE — Telephone Encounter (Signed)
Pt is describing hand foot syndrome. Showed up over the weekend and is worsening. 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  Burning, pain, redness, swelling at the joints, almost blistering. She is using the urea cream. Suggested cold compresses without putting ice directly on skin, suggested using her tramadol, suggested emollient cream. She is on her week of rest from xeloda. She does not think there is any infection present. Requesting if there is anything else she can do. She would be able to come to [facility] if necessary.

## 2016-01-08 ENCOUNTER — Telehealth: Payer: Self-pay | Admitting: Nurse Practitioner

## 2016-01-08 ENCOUNTER — Encounter: Payer: Self-pay | Admitting: Nurse Practitioner

## 2016-01-08 ENCOUNTER — Ambulatory Visit: Payer: Managed Care, Other (non HMO) | Admitting: Nurse Practitioner

## 2016-01-08 ENCOUNTER — Ambulatory Visit: Payer: Managed Care, Other (non HMO)

## 2016-01-08 ENCOUNTER — Other Ambulatory Visit: Payer: Managed Care, Other (non HMO)

## 2016-01-08 DIAGNOSIS — L271 Localized skin eruption due to drugs and medicaments taken internally: Secondary | ICD-10-CM | POA: Insufficient documentation

## 2016-01-08 NOTE — Progress Notes (Signed)
SYMPTOM MANAGEMENT CLINIC    Chief Complaint: Hand/foot syndrome  HPI:  [name] 50 y.o. female diagnosed with colon cancer.  Patient is clinically undergoing oxaliplatin and Xeloda oral therapy.   Patient states that she has developed some significant/severe hand/foot syndrome within the past week.  She states that her hands are increasingly red, very tender, and it is becoming increasingly difficult for her to type at work.  She actually states that she was sent home early from work today because it was too difficult for her to type.  She denies any recent fevers or chills.  Patient states that she has been using Urea cream to her hands with only minimal effectiveness.  Exam today reveals bilateral palms and soles of the feet are red/purple; and fairly tender to palpation.  Her nails are intact with no cracks.  There is no obvious indication for any infection.  Long discussion with both patient and her husband regarding hand and foot care.  Patient will need to continually moisturize her hands and feet; and moisturize just before bed as well.  She will need to apply cotton gloves to her hands and cotton White socks prior to bed as well.  She needs to avoid extremes in temperature as well.  Patient was scheduled to receive her next cycle of oxaliplatin chemotherapy this coming Friday, 01/08/2016; but will hold the oxaliplatin for 1 week to allow for her hands and feet to recover.  Also, patient is on her off week of Xeloda oral therapy; and she was instructed to hold the initiation of her next cycle of Xeloda until next week as well.  Patient was advised to call/return if her symptoms become worse.  Oncology History   Cancer of right colon [facility])   Staging form: Colon and Rectum, AJCC 7th Edition     Pathologic stage from 08/03/2015: Stage IIIB (T4a, N1a, cM0) - Signed by Truitt Merle, MD on 09/04/2015       Cancer of right colon ([place])   03/31/2015 Imaging CT abdomen showed  appendicitis, perforated, with periappendiceal abscess. And small pulmonary nodules.   04/03/2015 Procedure CT guided placement of going Through into the right lower abdominal quadrant with aspiration of a total of 30 mL prudent fluid.    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 One out of 40 lymph nodes were positive, (+) LVI, margins negative    08/03/2015 Miscellaneous colon cance MSI-stable     Review of Systems  Skin:       Bilateral hand and foot erythema and tenderness.  All other systems reviewed and are negative.   Past Medical History  Diagnosis Date  . Anemia   . Depression   . Anxiety   . JP drain bleeding   . GERD (gastroesophageal reflux disease)   . Fibromyalgia     Past Surgical History  Procedure Laterality Date  . Abdominal hysterectomy    . Gastric bypass    . Colon surgery  08/03/2015  . Laparoscopic ileocecectomy Right 08/03/2015    Procedure: LAPAROSCOPIC DIAGNOSTIC RIGHT COLECTOMY;  Surgeon: Rolm Bookbinder, MD;  Location: [place];  Service: General;  Laterality: Right;  . Colostomy revision N/A 08/03/2015    Procedure: COLON RESECTION RIGHT;  Surgeon: Rolm Bookbinder, MD;  Location: [place];  Service: General;  Laterality: N/A;    has Perforated appendicitis; Appendiceal  abscess; Appendicitis, acute; S/P colectomy; Cancer of right colon ([place]); Anemia, iron deficiency; Dehydration; Hyponatremia; Hypotension; Transaminitis; Hyperphosphatemia; Hypoalbuminemia due to protein-calorie malnutrition ([place]); Insomnia; Open abdominal wall wound; and Hand foot syndrome on her problem list.    is allergic to latex; cymbalta; and penicillins.    Medication List       This list is accurate as of: 01/05/16 11:59 PM.  Always use your most recent med list.                acetaminophen 500 MG tablet  Commonly known as:  TYLENOL  Take 1,000 mg by mouth every 6 (six) hours as needed for fever. Reported on 09/24/2015     ALPRAZolam 0.25 MG tablet  Commonly known as:  XANAX  Take 1-2 tablets (0.25-0.5 mg total) by mouth at bedtime as needed for anxiety or sleep.     capecitabine 500 MG tablet  Commonly known as:  XELODA  Take 3 tablets (1,500 mg total) by mouth 2 (two) times daily after a meal. Take on days 1-14 of chemotherapy, then 7 days off     cyclobenzaprine 10 MG tablet  Commonly known as:  FLEXERIL  Take 1 tablet (10 mg total) by mouth 3 (three) times daily as needed for muscle spasms.     dexamethasone 4 MG tablet  Commonly known as:  DECADRON  Take 2 tablets (8 mg total) by mouth daily. Start day after chemo and take for 3 days after each IV chemo     diphenhydrAMINE 25 mg capsule  Commonly known as:  BENADRYL  Take 25 mg by mouth every 6 (six) hours as needed for sleep.     escitalopram 10 MG tablet  Commonly known as:  LEXAPRO  Take 10 mg by mouth daily.     ferrous sulfate 325 (65 FE) MG tablet  Take 325 mg by mouth 2 (two) times daily with a meal. Reported on 10/12/2015     ondansetron 8 MG tablet  Commonly known as:  ZOFRAN  Take 1 tablet (8 mg total) by mouth as directed. Take twice daily as needed for nausea-start on day 3 of chemotherapy     oxyCODONE 5 MG immediate release tablet  Commonly known as:  Oxy IR/ROXICODONE  Take 1 tablet (5 mg total) by mouth every 4 (four) hours as needed for severe pain.     potassium chloride SA 20 MEQ tablet  Commonly known as:  K-DUR,KLOR-CON  Take 1 tab PO TID x 1 day; then take 1 tab PO BID till gone.     prochlorperazine 10 MG tablet  Commonly known as:  COMPAZINE  Take 1 tablet (10 mg total) by mouth every 8 (eight) hours as needed for nausea or vomiting.     traMADol 50 MG tablet  Commonly known as:  ULTRAM  Take 1-2 tablets (50-100 mg total) by mouth every 6 (six) hours as needed.       urea 10 % cream  Commonly known as:  CARMOL  Apply topically as needed.     vitamin B-12 1000 MCG tablet  Commonly known as:  CYANOCOBALAMIN  Take 500 mcg by mouth daily. Reported on 09/24/2015     vitamin C 500 MG tablet  Commonly known as:  ASCORBIC ACID  Take 500 mg by mouth daily.         PHYSICAL EXAMINATION  Oncology Vitals 01/05/2016 12/18/2015  Height 155 cm 155 cm  Weight 59.829 kg 61.009 kg  Weight (lbs) 131  lbs 14 oz 134 lbs 8 oz  BMI (kg/m2) 24.92 kg/m2 25.41 kg/m2  Temp 98.5 98.2  Pulse 84 76  Resp 18 18  SpO2 100 98  BSA (m2) 1.6 m2 1.62 m2   BP Readings from Last 2 Encounters:  01/05/16 126/84  12/18/15 144/84    Physical Exam  Constitutional: She is oriented to person, place, and time and well-developed, well-nourished, and in no distress.  HENT:  Head: Normocephalic and atraumatic.  Eyes: Conjunctivae and EOM are normal. Pupils are equal, round, and reactive to light. Right eye exhibits no discharge. Left eye exhibits no discharge. No scleral icterus.  Neck: Normal range of motion.  Pulmonary/Chest: Effort normal. No respiratory distress.  Musculoskeletal: Normal range of motion. She exhibits tenderness. She exhibits no edema.  Neurological: She is alert and oriented to person, place, and time. Gait normal.  Skin: Skin is warm and dry. No rash noted. There is erythema. No pallor.  Exam today reveals bilateral palms and soles of the feet are red/purple; and fairly tender to palpation.  Her nails are intact with no cracks.  There is no obvious indication for any infection.     Psychiatric: Affect normal.  Nursing note and vitals reviewed.   LABORATORY DATA:. No visits with results within 3 Day(s) from this visit. Latest known visit with results is:  Appointment on 12/18/2015  Component Date Value Ref Range Status  . Sodium 12/18/2015 142  136 - 145 mEq/L Final  . Potassium 12/18/2015 3.9  3.5 - 5.1 mEq/L Final  . Chloride 12/18/2015 107  98 -  109 mEq/L Final  . CO2 12/18/2015 24  22 - 29 mEq/L Final  . Glucose 12/18/2015 83  70 - 140 mg/dl Final   Glucose reference range is for nonfasting patients. Fasting glucose reference range is 70- 100.  Marland Kitchen BUN 12/18/2015 9.9  7.0 - 26.0 mg/dL Final  . Creatinine 12/18/2015 0.7  0.6 - 1.1 mg/dL Final  . Total Bilirubin 12/18/2015 0.37  0.20 - 1.20 mg/dL Final  . Alkaline Phosphatase 12/18/2015 99  40 - 150 U/L Final  . AST 12/18/2015 14  5 - 34 U/L Final  . ALT 12/18/2015 9  0 - 55 U/L Final  . Total Protein 12/18/2015 7.4  6.4 - 8.3 g/dL Final  . Albumin 12/18/2015 4.1  3.5 - 5.0 g/dL Final  . Calcium 12/18/2015 9.8  8.4 - 10.4 mg/dL Final  . Anion Gap 12/18/2015 11  3 - 11 mEq/L Final  . EGFR 12/18/2015 >90  >90 ml/min/1.73 m2 Final   eGFR is calculated using the CKD-EPI Creatinine Equation (2009)  . WBC 12/18/2015 5.3  3.9 - 10.3 10e3/uL Final  . NEUT# 12/18/2015 2.9  1.5 - 6.5 10e3/uL Final  . HGB 12/18/2015 12.4  11.6 - 15.9 g/dL Final  . HCT 12/18/2015 36.9  34.8 - 46.6 % Final  . Platelets 12/18/2015 185  145 - 400 10e3/uL Final  . MCV 12/18/2015 90.7  79.5 - 101.0 fL Final  . MCH 12/18/2015 30.5  25.1 - 34.0 pg Final  . MCHC 12/18/2015 33.6  31.5 - 36.0 g/dL Final  . RBC 12/18/2015 4.07  3.70 - 5.45 10e6/uL Final  . RDW 12/18/2015 21.0* 11.2 - 14.5 % Final  . lymph# 12/18/2015 1.8  0.9 - 3.3 10e3/uL Final  . MONO# 12/18/2015 0.5  0.1 - 0.9 10e3/uL Final  . Eosinophils Absolute 12/18/2015 0.1  0.0 - 0.5 10e3/uL Final  . Basophils Absolute 12/18/2015 0.0  0.0 -  0.1 10e3/uL Final  . NEUT% 12/18/2015 54.4  38.4 - 76.8 % Final  . LYMPH% 12/18/2015 34.1  14.0 - 49.7 % Final  . MONO% 12/18/2015 9.0  0.0 - 14.0 % Final  . EOS% 12/18/2015 2.1  0.0 - 7.0 % Final  . BASO% 12/18/2015 0.4  0.0 - 2.0 % Final  . CEA 12/18/2015 2.1  0.0 - 4.7 ng/mL Final   Comment:                       Roche ECLIA methodology       Nonsmokers  <3.9                                                      Smokers     <5.6   . Ferritin 12/18/2015 56  9 - 269 ng/ml Final  . Iron 12/18/2015 68  41 - 142 ug/dL Final  . TIBC 12/18/2015 363  236 - 444 ug/dL Final  . UIBC 12/18/2015 294  120 - 384 ug/dL Final  . %SAT 12/18/2015 19* 21 - 57 % Final       RADIOGRAPHIC STUDIES: No results found.  ASSESSMENT/PLAN:    Hand foot syndrome Patient states that she has developed some significant/severe hand/foot syndrome within the past week.  She states that her hands are increasingly red, very tender, and it is becoming increasingly difficult for her to type at work.  She actually states that she was sent home early from work today because it was too difficult for her to type.  She denies any recent fevers or chills.  Patient states that she has been using Urea cream to her hands with only minimal effectiveness.  Exam today reveals bilateral palms and soles of the feet are red/purple; and fairly tender to palpation.  Her nails are intact with no cracks.  There is no obvious indication for any infection.  Long discussion with both patient and her husband regarding hand and foot care.  Patient will need to continually moisturize her hands and feet; and moisturize just before bed as well.  She will need to apply cotton gloves to her hands and cotton White socks prior to bed as well.  She needs to avoid extremes in temperature as well.  Patient was scheduled to receive her next cycle of oxaliplatin chemotherapy this coming Friday, 01/08/2016; but will hold the oxaliplatin for 1 week to allow for her hands and feet to recover.  Also, patient is on her off week of Xeloda oral therapy; and she was instructed to hold the initiation of her next cycle of Xeloda until next week as well.  Patient was advised to call/return if her symptoms become worse.  Cancer of right colon [facility]) Patient received cycle 2 of her carboplatin chemotherapy on 12/18/2015.  She continues to take the Xeloda oral therapy as  directed.  Due to significant hand/foot syndrome issues-patient will postpone cycle 3 of her chemotherapy until next week.  She will also hold on initiating her next cycle of Xeloda oral therapy until next week as well.  Patient will need to be rescheduled for labs, symptom management clinic visit, and chemotherapy for next Friday, 01/15/2016 instead.   Patient stated understanding of all instructions; and was in agreement with this plan of care. The patient knows to  call the clinic with any problems, questions or concerns.   Total time spent with patient was 25 minutes;  with greater than 75 percent of that time spent in face to face counseling regarding patient's symptoms,  and coordination of care and follow up.  Disclaimer:This dictation was prepared with Dragon/digital dictation along with [employer]. Any transcriptional errors that result from this process are unintentional.  Drue Second, NP 01/08/2016

## 2016-01-08 NOTE — Assessment & Plan Note (Signed)
Patient states that she has developed some significant/severe hand/foot syndrome within the past week.  She states that her hands are increasingly red, very tender, and it is becoming increasingly difficult for her to type at work.  She actually states that she was sent home early from work today because it was too difficult for her to type.  She denies any recent fevers or chills.  Patient states that she has been using Urea cream to her hands with only minimal effectiveness.  Exam today reveals bilateral palms and soles of the feet are red/purple; and fairly tender to palpation.  Her nails are intact with no cracks.  There is no obvious indication for any infection.  Long discussion with both patient and her husband regarding hand and foot care.  Patient will need to continually moisturize her hands and feet; and moisturize just before bed as well.  She will need to apply cotton gloves to her hands and cotton White socks prior to bed as well.  She needs to avoid extremes in temperature as well.  Patient was scheduled to receive her next cycle of oxaliplatin chemotherapy this coming Friday, 01/08/2016; but will hold the oxaliplatin for 1 week to allow for her hands and feet to recover.  Also, patient is on her off week of Xeloda oral therapy; and she was instructed to hold the initiation of her next cycle of Xeloda until next week as well.  Patient was advised to call/return if her symptoms become worse.

## 2016-01-08 NOTE — Telephone Encounter (Signed)
spoke w/ pt confirmed 6/23 apt times

## 2016-01-08 NOTE — Assessment & Plan Note (Signed)
Patient received cycle 2 of her carboplatin chemotherapy on 12/18/2015.  She continues to take the Xeloda oral therapy as directed.  Due to significant hand/foot syndrome issues-patient will postpone cycle 3 of her chemotherapy until next week.  She will also hold on initiating her next cycle of Xeloda oral therapy until next week as well.  Patient will need to be rescheduled for labs, symptom management clinic visit, and chemotherapy for next Friday, 01/15/2016 instead.

## 2016-01-15 ENCOUNTER — Ambulatory Visit (HOSPITAL_BASED_OUTPATIENT_CLINIC_OR_DEPARTMENT_OTHER): Payer: Managed Care, Other (non HMO) | Admitting: Nurse Practitioner

## 2016-01-15 ENCOUNTER — Other Ambulatory Visit (HOSPITAL_BASED_OUTPATIENT_CLINIC_OR_DEPARTMENT_OTHER): Payer: Managed Care, Other (non HMO)

## 2016-01-15 ENCOUNTER — Ambulatory Visit (HOSPITAL_BASED_OUTPATIENT_CLINIC_OR_DEPARTMENT_OTHER): Payer: Managed Care, Other (non HMO)

## 2016-01-15 ENCOUNTER — Other Ambulatory Visit: Payer: Managed Care, Other (non HMO)

## 2016-01-15 ENCOUNTER — Ambulatory Visit: Payer: Managed Care, Other (non HMO)

## 2016-01-15 ENCOUNTER — Encounter: Payer: Managed Care, Other (non HMO) | Admitting: Nurse Practitioner

## 2016-01-15 VITALS — BP 118/67 | HR 80 | Temp 98.2°F | Resp 18 | Ht 61.0 in | Wt 133.5 lb

## 2016-01-15 DIAGNOSIS — C182 Malignant neoplasm of ascending colon: Secondary | ICD-10-CM

## 2016-01-15 DIAGNOSIS — Z5111 Encounter for antineoplastic chemotherapy: Secondary | ICD-10-CM

## 2016-01-15 DIAGNOSIS — L271 Localized skin eruption due to drugs and medicaments taken internally: Secondary | ICD-10-CM | POA: Diagnosis not present

## 2016-01-15 LAB — CBC WITH DIFFERENTIAL/PLATELET
BASO%: 1.1 % (ref 0.0–2.0)
BASOS ABS: 0.1 10*3/uL (ref 0.0–0.1)
EOS%: 3.5 % (ref 0.0–7.0)
Eosinophils Absolute: 0.2 10*3/uL (ref 0.0–0.5)
HEMATOCRIT: 36.6 % (ref 34.8–46.6)
HGB: 12.2 g/dL (ref 11.6–15.9)
LYMPH#: 1.5 10*3/uL (ref 0.9–3.3)
LYMPH%: 34.4 % (ref 14.0–49.7)
MCH: 31.6 pg (ref 25.1–34.0)
MCHC: 33.3 g/dL (ref 31.5–36.0)
MCV: 95.1 fL (ref 79.5–101.0)
MONO#: 0.5 10*3/uL (ref 0.1–0.9)
MONO%: 11.9 % (ref 0.0–14.0)
NEUT#: 2.2 10*3/uL (ref 1.5–6.5)
NEUT%: 49.1 % (ref 38.4–76.8)
Platelets: 208 10*3/uL (ref 145–400)
RBC: 3.85 10*6/uL (ref 3.70–5.45)
RDW: 17.3 % — ABNORMAL HIGH (ref 11.2–14.5)
WBC: 4.5 10*3/uL (ref 3.9–10.3)

## 2016-01-15 LAB — COMPREHENSIVE METABOLIC PANEL
ALBUMIN: 3.7 g/dL (ref 3.5–5.0)
ALK PHOS: 125 U/L (ref 40–150)
ALT: 12 U/L (ref 0–55)
AST: 16 U/L (ref 5–34)
Anion Gap: 9 mEq/L (ref 3–11)
BILIRUBIN TOTAL: 0.37 mg/dL (ref 0.20–1.20)
BUN: 9.6 mg/dL (ref 7.0–26.0)
CO2: 25 mEq/L (ref 22–29)
CREATININE: 0.7 mg/dL (ref 0.6–1.1)
Calcium: 9.1 mg/dL (ref 8.4–10.4)
Chloride: 107 mEq/L (ref 98–109)
GLUCOSE: 106 mg/dL (ref 70–140)
Potassium: 4.1 mEq/L (ref 3.5–5.1)
SODIUM: 142 meq/L (ref 136–145)
TOTAL PROTEIN: 7.1 g/dL (ref 6.4–8.3)

## 2016-01-15 MED ORDER — DEXTROSE 5 % IV SOLN
Freq: Once | INTRAVENOUS | Status: AC
  Administered 2016-01-15: 14:00:00 via INTRAVENOUS

## 2016-01-15 MED ORDER — SODIUM CHLORIDE 0.9 % IV SOLN
10.0000 mg | Freq: Once | INTRAVENOUS | Status: AC
  Administered 2016-01-15: 10 mg via INTRAVENOUS
  Filled 2016-01-15: qty 1

## 2016-01-15 MED ORDER — TRAMADOL HCL 50 MG PO TABS: 50.0000 mg | ORAL_TABLET | Freq: Four times a day (QID) | ORAL | Status: DC | PRN

## 2016-01-15 MED ORDER — PALONOSETRON HCL INJECTION 0.25 MG/5ML
0.2500 mg | Freq: Once | INTRAVENOUS | Status: AC
  Administered 2016-01-15: 0.25 mg via INTRAVENOUS

## 2016-01-15 MED ORDER — OXALIPLATIN CHEMO INJECTION 100 MG/20ML
80.0000 mg/m2 | Freq: Once | INTRAVENOUS | Status: AC
  Administered 2016-01-15: 130 mg via INTRAVENOUS
  Filled 2016-01-15: qty 20

## 2016-01-15 MED ORDER — PALONOSETRON HCL INJECTION 0.25 MG/5ML
INTRAVENOUS | Status: AC
  Filled 2016-01-15: qty 5

## 2016-01-15 MED ORDER — ALPRAZOLAM 0.25 MG PO TABS: 0.2500 mg | ORAL_TABLET | Freq: Every evening | ORAL | Status: DC | PRN

## 2016-01-15 NOTE — Patient Instructions (Signed)
Oxaliplatin Injection What is this medicine? OXALIPLATIN (ox AL i PLA tin) is a chemotherapy drug. It targets fast dividing cells, like cancer cells, and causes these cells to die. This medicine is used to treat cancers of the colon and rectum, and many other cancers. This medicine may be used for other purposes; ask your health care provider or pharmacist if you have questions. What should I tell my health care provider before I take this medicine? They need to know if you have any of these conditions: -kidney disease -an unusual or allergic reaction to oxaliplatin, other chemotherapy, other medicines, foods, dyes, or preservatives -pregnant or trying to get pregnant -breast-feeding How should I use this medicine? This drug is given as an infusion into a vein. It is administered in a hospital or clinic by a specially trained health care professional. Talk to your pediatrician regarding the use of this medicine in children. Special care may be needed. Overdosage: If you think you have taken too much of this medicine contact a poison control center or emergency room at once. NOTE: This medicine is only for you. Do not share this medicine with others. What if I miss a dose? It is important not to miss a dose. Call your doctor or health care professional if you are unable to keep an appointment. What may interact with this medicine? -medicines to increase blood counts like filgrastim, pegfilgrastim, sargramostim -probenecid -some antibiotics like amikacin, gentamicin, neomycin, polymyxin B, streptomycin, tobramycin -zalcitabine Talk to your doctor or health care professional before taking any of these medicines: -acetaminophen -aspirin -ibuprofen -ketoprofen -naproxen This list may not describe all possible interactions. Give your health care provider a list of all the medicines, herbs, non-prescription drugs, or dietary supplements you use. Also tell them if you smoke, drink alcohol, or use  illegal drugs. Some items may interact with your medicine. What should I watch for while using this medicine? Your condition will be monitored carefully while you are receiving this medicine. You will need important blood work done while you are taking this medicine. This medicine can make you more sensitive to cold. Do not drink cold drinks or use ice. Cover exposed skin before coming in contact with cold temperatures or cold objects. When out in cold weather wear warm clothing and cover your mouth and nose to warm the air that goes into your lungs. Tell your doctor if you get sensitive to the cold. This drug may make you feel generally unwell. This is not uncommon, as chemotherapy can affect healthy cells as well as cancer cells. Report any side effects. Continue your course of treatment even though you feel ill unless your doctor tells you to stop. In some cases, you may be given additional medicines to help with side effects. Follow all directions for their use. Call your doctor or health care professional for advice if you get a fever, chills or sore throat, or other symptoms of a cold or flu. Do not treat yourself. This drug decreases your body's ability to fight infections. Try to avoid being around people who are sick. This medicine may increase your risk to bruise or bleed. Call your doctor or health care professional if you notice any unusual bleeding. Be careful brushing and flossing your teeth or using a toothpick because you may get an infection or bleed more easily. If you have any dental work done, tell your dentist you are receiving this medicine. Avoid taking products that contain aspirin, acetaminophen, ibuprofen, naproxen, or ketoprofen unless instructed  may get an infection or bleed more easily. If you have any dental work done, tell your dentist you are receiving this medicine.  Avoid taking products that contain aspirin, acetaminophen, ibuprofen, naproxen, or ketoprofen unless instructed by your doctor. These medicines may hide a fever.  Do not become pregnant while taking this medicine. Women should inform their doctor if they wish to become pregnant or think they might be pregnant. There is a potential for serious side  effects to an unborn child. Talk to your health care professional or pharmacist for more information. Do not breast-feed an infant while taking this medicine.  Call your doctor or health care professional if you get diarrhea. Do not treat yourself.  What side effects may I notice from receiving this medicine?  Side effects that you should report to your doctor or health care professional as soon as possible:  -allergic reactions like skin rash, itching or hives, swelling of the face, lips, or tongue  -low blood counts - This drug may decrease the number of white blood cells, red blood cells and platelets. You may be at increased risk for infections and bleeding.  -signs of infection - fever or chills, cough, sore throat, pain or difficulty passing urine  -signs of decreased platelets or bleeding - bruising, pinpoint red spots on the skin, black, tarry stools, nosebleeds  -signs of decreased red blood cells - unusually weak or tired, fainting spells, lightheadedness  -breathing problems  -chest pain, pressure  -cough  -diarrhea  -jaw tightness  -mouth sores  -nausea and vomiting  -pain, swelling, redness or irritation at the injection site  -pain, tingling, numbness in the hands or feet  -problems with balance, talking, walking  -redness, blistering, peeling or loosening of the skin, including inside the mouth  -trouble passing urine or change in the amount of urine  Side effects that usually do not require medical attention (report to your doctor or health care professional if they continue or are bothersome):  -changes in vision  -constipation  -hair loss  -loss of appetite  -metallic taste in the mouth or changes in taste  -stomach pain  This list may not describe all possible side effects. Call your doctor for medical advice about side effects. You may report side effects to FDA at 1-800-FDA-1088.  Where should I keep my medicine?  This drug is given in a hospital or clinic and will not be stored at home.  NOTE:  This sheet is a summary. It may not cover all possible information. If you have questions about this medicine, talk to your doctor, pharmacist, or health care provider.      2016, Elsevier/Gold Standard. (2008-02-05 17:22:47)

## 2016-01-20 ENCOUNTER — Telehealth: Payer: Self-pay | Admitting: *Deleted

## 2016-01-20 ENCOUNTER — Other Ambulatory Visit: Payer: Self-pay | Admitting: Nurse Practitioner

## 2016-01-20 ENCOUNTER — Encounter: Payer: Self-pay | Admitting: Nurse Practitioner

## 2016-01-20 ENCOUNTER — Telehealth: Payer: Self-pay | Admitting: Hematology

## 2016-01-20 ENCOUNTER — Encounter: Payer: Self-pay | Admitting: Hematology

## 2016-01-20 DIAGNOSIS — C182 Malignant neoplasm of ascending colon: Secondary | ICD-10-CM

## 2016-01-20 NOTE — Assessment & Plan Note (Signed)
Patient received cycle 2 of her carboplatin chemotherapy on 12/18/2015.  She continues to take the Xeloda oral therapy as directed.  Due to significant hand/foot syndrome issues-patient will postpone cycle 3 of her chemotherapy until next week.  She will also hold on initiating her next cycle of Xeloda oral therapy until next week as well. ________________________________________________  Update: Patient's hand/foot syndrome to both her hands or feet has greatly improved since she held both her oxaliplatin infusion and the oral Xeloda therapy for 1 week.  Labs obtained today were all within normal limits as well.  Per Dr. Ernestina Penna orders-patient will receive the oxaliplatin at a increased dose of 60% of regular dosing; and will decrease the Xeloda to 2 pills in the morning and 3 pills at night.  Hopefully, this will help decrease the issues with hand/foot syndrome.  Patient will need to be rescheduled for labs, visit, and her next cycle of chemotherapy on 02/05/2016.

## 2016-01-20 NOTE — Assessment & Plan Note (Signed)
Patient states that she has developed some significant/severe hand/foot syndrome within the past week.  She states that her hands are increasingly red, very tender, and it is becoming increasingly difficult for her to type at work.  She actually states that she was sent home early from work today because it was too difficult for her to type.  She denies any recent fevers or chills.  Patient states that she has been using Urea cream to her hands with only minimal effectiveness.  Exam today reveals bilateral palms and soles of the feet are red/purple; and fairly tender to palpation.  Her nails are intact with no cracks.  There is no obvious indication for any infection.  Long discussion with both patient and her husband regarding hand and foot care.  Patient will need to continually moisturize her hands and feet; and moisturize just before bed as well.  She will need to apply cotton gloves to her hands and cotton White socks prior to bed as well.  She needs to avoid extremes in temperature as well.  Patient was scheduled to receive her next cycle of oxaliplatin chemotherapy this coming Friday, 01/08/2016; but will hold the oxaliplatin for 1 week to allow for her hands and feet to recover.  Also, patient is on her off week of Xeloda oral therapy; and she was instructed to hold the initiation of her next cycle of Xeloda until next week as well.  Patient was advised to call/return if her symptoms become worse. ____________________________________________________  Update: Patient held the initiation of her next cycle of Xeloda and held her chemotherapy infusion last week due to significant hand/foot syndrome issues.  She was instructed to continue with her moisturizing cream to both her hands and feet as well.  Patient returned to the [facility] today for a follow-up visit.  Patient states that her hands are feeling much better and she has been able to return to work.  Exam today reveals hands much  less red and now nontender with palpation.  There is no edema whatsoever.  There is some peeling skin to her hands; but her nails are intact and there is no evidence of infection.  Patient continues with full range of motion with her hands now.  Patient will be able to proceed with her chemotherapy and initiated her next cycle of Xeloda oral therapy as well today.  See further notes for details.

## 2016-01-20 NOTE — Telephone Encounter (Signed)
Dr Burr Medico called and spoke w/ pt apt made per MD .. Pt aware

## 2016-01-20 NOTE — Telephone Encounter (Signed)
Per staff message and POF I have scheduled appts. Advised scheduler of appts. JMW  

## 2016-01-20 NOTE — Progress Notes (Signed)
SYMPTOM MANAGEMENT CLINIC    Chief Complaint: Hand/foot syndrome  HPI:  [name] 50 y.o. female diagnosed with colon cancer.  Currently undergoing oxaliplatin infusion and Xeloda oral therapy.  Patient received cycle 2 of her carboplatin chemotherapy on 12/18/2015.  She continues to take the Xeloda oral therapy as directed.  Due to significant hand/foot syndrome issues-patient will postpone cycle 3 of her chemotherapy until next week.  She will also hold on initiating her next cycle of Xeloda oral therapy until next week as well. ________________________________________________  Update: Patient's hand/foot syndrome to both her hands or feet has greatly improved since she held both her oxaliplatin infusion and the oral Xeloda therapy for 1 week.  Labs obtained today were all within normal limits as well.  Per Dr. Ernestina Penna orders-patient will receive the oxaliplatin at a increased dose of 60% of regular dosing; and will decrease the Xeloda to 2 pills in the morning and 3 pills at night.  Hopefully, this will help decrease the issues with hand/foot syndrome.  Patient will need to be rescheduled for labs, visit, and her next cycle of chemotherapy on 02/05/2016.   Oncology History   Cancer of right colon [facility])   Staging form: Colon and Rectum, AJCC 7th Edition     Pathologic stage from 08/03/2015: Stage IIIB (T4a, N1a, cM0) - Signed by Truitt Merle, MD on 09/04/2015       Cancer of right colon ([place])   03/31/2015 Imaging CT abdomen showed appendicitis, perforated, with periappendiceal abscess. And small pulmonary nodules.   04/03/2015 Procedure CT guided placement of going Through into the right lower abdominal quadrant with aspiration of a total of 30 mL prudent fluid.    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 One out of 40 lymph nodes were positive, (+) LVI, margins negative    08/03/2015 Miscellaneous colon cance MSI-stable     Review of Systems  Skin:       Hand/foot syndrome to his feet and hands.  All other systems reviewed and are negative.   Past Medical History  Diagnosis Date  . Anemia   . Depression   . Anxiety   . JP drain bleeding   . GERD (gastroesophageal reflux disease)   . Fibromyalgia     Past Surgical History  Procedure Laterality Date  . Abdominal hysterectomy    . Gastric bypass    . Colon surgery  08/03/2015  . Laparoscopic ileocecectomy Right 08/03/2015    Procedure: LAPAROSCOPIC DIAGNOSTIC RIGHT COLECTOMY;  Surgeon: Rolm Bookbinder, MD;  Location: [place];  Service: General;  Laterality: Right;  . Colostomy revision N/A 08/03/2015    Procedure: COLON RESECTION RIGHT;  Surgeon: Rolm Bookbinder, MD;  Location: [place];  Service: General;  Laterality: N/A;    has Perforated appendicitis; Appendiceal abscess; Appendicitis, acute; S/P colectomy; Cancer of right colon ([place]); Anemia, iron deficiency; Dehydration; Hyponatremia; Hypotension; Transaminitis; Hyperphosphatemia; Hypoalbuminemia due to protein-calorie malnutrition ([place]); Insomnia; Open abdominal wall wound; and Hand foot syndrome on her problem list.    is allergic to latex; cymbalta; and penicillins.    Medication List       This list is accurate as of: 01/15/16 11:59 PM.  Always use your most recent med list.               acetaminophen 500 MG tablet  Commonly known  as:  TYLENOL  Take 1,000 mg by mouth every 6 (six) hours as needed for fever. Reported on 09/24/2015     ALPRAZolam 0.25 MG tablet  Commonly known as:  XANAX  Take 1-2 tablets (0.25-0.5 mg total) by mouth at bedtime as needed for anxiety or sleep.     capecitabine 500 MG tablet  Commonly known as:  XELODA  Take 3 tablets (1,500 mg total) by mouth 2 (two) times daily after a meal. Take on days 1-14 of chemotherapy, then 7  days off     cyclobenzaprine 10 MG tablet  Commonly known as:  FLEXERIL  Take 1 tablet (10 mg total) by mouth 3 (three) times daily as needed for muscle spasms.     dexamethasone 4 MG tablet  Commonly known as:  DECADRON  Take 2 tablets (8 mg total) by mouth daily. Start day after chemo and take for 3 days after each IV chemo     diphenhydrAMINE 25 mg capsule  Commonly known as:  BENADRYL  Take 25 mg by mouth every 6 (six) hours as needed for sleep.     escitalopram 10 MG tablet  Commonly known as:  LEXAPRO  Take 10 mg by mouth daily.     ferrous sulfate 325 (65 FE) MG tablet  Take 325 mg by mouth 2 (two) times daily with a meal. Reported on 10/12/2015     ondansetron 8 MG tablet  Commonly known as:  ZOFRAN  Take 1 tablet (8 mg total) by mouth as directed. Take twice daily as needed for nausea-start on day 3 of chemotherapy     oxyCODONE 5 MG immediate release tablet  Commonly known as:  Oxy IR/ROXICODONE  Take 1 tablet (5 mg total) by mouth every 4 (four) hours as needed for severe pain.     potassium chloride SA 20 MEQ tablet  Commonly known as:  K-DUR,KLOR-CON  Take 1 tab PO TID x 1 day; then take 1 tab PO BID till gone.     prochlorperazine 10 MG tablet  Commonly known as:  COMPAZINE  Take 1 tablet (10 mg total) by mouth every 8 (eight) hours as needed for nausea or vomiting.     traMADol 50 MG tablet  Commonly known as:  ULTRAM  Take 1-2 tablets (50-100 mg total) by mouth every 6 (six) hours as needed.     urea 10 % cream  Commonly known as:  CARMOL  Apply topically as needed.     vitamin B-12 1000 MCG tablet  Commonly known as:  CYANOCOBALAMIN  Take 500 mcg by mouth daily. Reported on 09/24/2015     vitamin C 500 MG tablet  Commonly known as:  ASCORBIC ACID  Take 500 mg by mouth daily.         PHYSICAL EXAMINATION  Oncology Vitals 01/15/2016 01/05/2016  Height 155 cm 155 cm  Weight 60.555 kg 59.829 kg  Weight (lbs) 133 lbs 8 oz 131 lbs 14 oz  BMI (kg/m2)  25.22 kg/m2 24.92 kg/m2  Temp 98.2 98.5  Pulse 80 84  Resp 18 18  SpO2 100 100  BSA (m2) 1.61 m2 1.6 m2   BP Readings from Last 2 Encounters:  01/15/16 118/67  01/05/16 126/84    Physical Exam  Constitutional: She is oriented to person, place, and time. She appears malnourished. She appears unhealthy. She appears cachectic.  HENT:  Head: Normocephalic and atraumatic.  Mouth/Throat: Oropharynx is clear and moist.  Eyes: Conjunctivae and EOM are normal. Pupils  are equal, round, and reactive to light. Right eye exhibits no discharge. Left eye exhibits no discharge. No scleral icterus.  Neck: Normal range of motion. Neck supple. No JVD present. No tracheal deviation present. No thyromegaly present.  Cardiovascular: Normal rate, regular rhythm, normal heart sounds and intact distal pulses.   Pulmonary/Chest: Effort normal and breath sounds normal. No respiratory distress. She has no wheezes. She has no rales. She exhibits no tenderness.  Abdominal: Soft. Bowel sounds are normal. She exhibits no distension and no mass. There is no tenderness. There is no rebound and no guarding.  Musculoskeletal: Normal range of motion. She exhibits no edema or tenderness.  Lymphadenopathy:    She has no cervical adenopathy.  Neurological: She is alert and oriented to person, place, and time. Gait normal.  Skin: Skin is warm and dry. No rash noted. There is erythema. No pallor.  Patient continues with mild erythema to the palms of her hands and the soles of her feet; but great improvement since last exam last week.  Patient still has some cracking and peeling of the skin to her hands as well; but nails are intact with no evidence of infection.  Psychiatric: Affect normal.  Nursing note and vitals reviewed.   LABORATORY DATA:. Appointment on 01/15/2016  Component Date Value Ref Range Status  . Sodium 01/15/2016 142  136 - 145 mEq/L Final  . Potassium 01/15/2016 4.1  3.5 - 5.1 mEq/L Final  . Chloride  01/15/2016 107  98 - 109 mEq/L Final  . CO2 01/15/2016 25  22 - 29 mEq/L Final  . Glucose 01/15/2016 106  70 - 140 mg/dl Final   Glucose reference range is for nonfasting patients. Fasting glucose reference range is 70- 100.  Marland Kitchen BUN 01/15/2016 9.6  7.0 - 26.0 mg/dL Final  . Creatinine 01/15/2016 0.7  0.6 - 1.1 mg/dL Final  . Total Bilirubin 01/15/2016 0.37  0.20 - 1.20 mg/dL Final  . Alkaline Phosphatase 01/15/2016 125  40 - 150 U/L Final  . AST 01/15/2016 16  5 - 34 U/L Final  . ALT 01/15/2016 12  0 - 55 U/L Final  . Total Protein 01/15/2016 7.1  6.4 - 8.3 g/dL Final  . Albumin 01/15/2016 3.7  3.5 - 5.0 g/dL Final  . Calcium 01/15/2016 9.1  8.4 - 10.4 mg/dL Final  . Anion Gap 01/15/2016 9  3 - 11 mEq/L Final  . EGFR 01/15/2016 >90  >90 ml/min/1.73 m2 Final   eGFR is calculated using the CKD-EPI Creatinine Equation (2009)  . WBC 01/15/2016 4.5  3.9 - 10.3 10e3/uL Final  . NEUT# 01/15/2016 2.2  1.5 - 6.5 10e3/uL Final  . HGB 01/15/2016 12.2  11.6 - 15.9 g/dL Final  . HCT 01/15/2016 36.6  34.8 - 46.6 % Final  . Platelets 01/15/2016 208  145 - 400 10e3/uL Final  . MCV 01/15/2016 95.1  79.5 - 101.0 fL Final  . MCH 01/15/2016 31.6  25.1 - 34.0 pg Final  . MCHC 01/15/2016 33.3  31.5 - 36.0 g/dL Final  . RBC 01/15/2016 3.85  3.70 - 5.45 10e6/uL Final  . RDW 01/15/2016 17.3* 11.2 - 14.5 % Final  . lymph# 01/15/2016 1.5  0.9 - 3.3 10e3/uL Final  . MONO# 01/15/2016 0.5  0.1 - 0.9 10e3/uL Final  . Eosinophils Absolute 01/15/2016 0.2  0.0 - 0.5 10e3/uL Final  . Basophils Absolute 01/15/2016 0.1  0.0 - 0.1 10e3/uL Final  . NEUT% 01/15/2016 49.1  38.4 - 76.8 % Final  .  LYMPH% 01/15/2016 34.4  14.0 - 49.7 % Final  . MONO% 01/15/2016 11.9  0.0 - 14.0 % Final  . EOS% 01/15/2016 3.5  0.0 - 7.0 % Final  . BASO% 01/15/2016 1.1  0.0 - 2.0 % Final     RADIOGRAPHIC STUDIES: No results found.  ASSESSMENT/PLAN:    Hand foot syndrome Patient states that she has developed some significant/severe  hand/foot syndrome within the past week.  She states that her hands are increasingly red, very tender, and it is becoming increasingly difficult for her to type at work.  She actually states that she was sent home early from work today because it was too difficult for her to type.  She denies any recent fevers or chills.  Patient states that she has been using Urea cream to her hands with only minimal effectiveness.  Exam today reveals bilateral palms and soles of the feet are red/purple; and fairly tender to palpation.  Her nails are intact with no cracks.  There is no obvious indication for any infection.  Long discussion with both patient and her husband regarding hand and foot care.  Patient will need to continually moisturize her hands and feet; and moisturize just before bed as well.  She will need to apply cotton gloves to her hands and cotton White socks prior to bed as well.  She needs to avoid extremes in temperature as well.  Patient was scheduled to receive her next cycle of oxaliplatin chemotherapy this coming Friday, 01/08/2016; but will hold the oxaliplatin for 1 week to allow for her hands and feet to recover.  Also, patient is on her off week of Xeloda oral therapy; and she was instructed to hold the initiation of her next cycle of Xeloda until next week as well.  Patient was advised to call/return if her symptoms become worse. ____________________________________________________  Update: Patient held the initiation of her next cycle of Xeloda and held her chemotherapy infusion last week due to significant hand/foot syndrome issues.  She was instructed to continue with her moisturizing cream to both her hands and feet as well.  Patient returned to the [facility] today for a follow-up visit.  Patient states that her hands are feeling much better and she has been able to return to work.  Exam today reveals hands much less red and now nontender with palpation.  There is no edema  whatsoever.  There is some peeling skin to her hands; but her nails are intact and there is no evidence of infection.  Patient continues with full range of motion with her hands now.  Patient will be able to proceed with her chemotherapy and initiated her next cycle of Xeloda oral therapy as well today.  See further notes for details.    Cancer of right colon [facility]) Patient received cycle 2 of her carboplatin chemotherapy on 12/18/2015.  She continues to take the Xeloda oral therapy as directed.  Due to significant hand/foot syndrome issues-patient will postpone cycle 3 of her chemotherapy until next week.  She will also hold on initiating her next cycle of Xeloda oral therapy until next week as well. ________________________________________________  Update: Patient's hand/foot syndrome to both her hands or feet has greatly improved since she held both her oxaliplatin infusion and the oral Xeloda therapy for 1 week.  Labs obtained today were all within normal limits as well.  Per Dr. Ernestina Penna orders-patient will receive the oxaliplatin at a increased dose of 60% of regular dosing; and will decrease the  Xeloda to 2 pills in the morning and 3 pills at night.  Hopefully, this will help decrease the issues with hand/foot syndrome.  Patient will need to be rescheduled for labs, visit, and her next cycle of chemotherapy on 02/05/2016.    Patient stated understanding of all instructions; and was in agreement with this plan of care. The patient knows to call the clinic with any problems, questions or concerns.   Total time spent with patient was 25 minutes;  with greater than 75 percent of that time spent in face to face counseling regarding patient's symptoms,  and coordination of care and follow up.  Disclaimer:This dictation was prepared with Dragon/digital dictation along with [employer]. Any transcriptional errors that result from this process are unintentional.  Drue Second,  NP 01/20/2016

## 2016-01-21 ENCOUNTER — Telehealth: Payer: Self-pay | Admitting: *Deleted

## 2016-01-21 NOTE — Telephone Encounter (Signed)
TC to pt to check status since [facility] visit 6/23. LM on pt home number

## 2016-02-04 ENCOUNTER — Encounter: Payer: Self-pay | Admitting: Hematology

## 2016-02-04 ENCOUNTER — Ambulatory Visit: Payer: Managed Care, Other (non HMO)

## 2016-02-04 ENCOUNTER — Ambulatory Visit: Payer: Managed Care, Other (non HMO) | Admitting: Hematology

## 2016-02-04 ENCOUNTER — Other Ambulatory Visit: Payer: Managed Care, Other (non HMO)

## 2016-02-04 ENCOUNTER — Ambulatory Visit (HOSPITAL_BASED_OUTPATIENT_CLINIC_OR_DEPARTMENT_OTHER): Payer: Managed Care, Other (non HMO) | Admitting: Hematology

## 2016-02-04 ENCOUNTER — Other Ambulatory Visit (HOSPITAL_BASED_OUTPATIENT_CLINIC_OR_DEPARTMENT_OTHER): Payer: Managed Care, Other (non HMO)

## 2016-02-04 VITALS — BP 118/73 | HR 87 | Temp 98.4°F | Resp 20 | Ht 61.0 in | Wt 134.5 lb

## 2016-02-04 DIAGNOSIS — C180 Malignant neoplasm of cecum: Secondary | ICD-10-CM

## 2016-02-04 DIAGNOSIS — D509 Iron deficiency anemia, unspecified: Secondary | ICD-10-CM | POA: Diagnosis not present

## 2016-02-04 DIAGNOSIS — F329 Major depressive disorder, single episode, unspecified: Secondary | ICD-10-CM

## 2016-02-04 DIAGNOSIS — C182 Malignant neoplasm of ascending colon: Secondary | ICD-10-CM

## 2016-02-04 DIAGNOSIS — L271 Localized skin eruption due to drugs and medicaments taken internally: Secondary | ICD-10-CM

## 2016-02-04 DIAGNOSIS — M791 Myalgia: Secondary | ICD-10-CM | POA: Diagnosis not present

## 2016-02-04 LAB — COMPREHENSIVE METABOLIC PANEL WITH GFR
ALT: 12 U/L (ref 0–55)
AST: 19 U/L (ref 5–34)
Albumin: 3.6 g/dL (ref 3.5–5.0)
Alkaline Phosphatase: 125 U/L (ref 40–150)
Anion Gap: 9 meq/L (ref 3–11)
BUN: 5.9 mg/dL — ABNORMAL LOW (ref 7.0–26.0)
CO2: 25 meq/L (ref 22–29)
Calcium: 9.2 mg/dL (ref 8.4–10.4)
Chloride: 106 meq/L (ref 98–109)
Creatinine: 0.7 mg/dL (ref 0.6–1.1)
EGFR: >90 ml/min/1.73 m2
Glucose: 95 mg/dL (ref 70–140)
Potassium: 4.1 meq/L (ref 3.5–5.1)
Sodium: 141 meq/L (ref 136–145)
Total Bilirubin: 0.42 mg/dL (ref 0.20–1.20)
Total Protein: 6.8 g/dL (ref 6.4–8.3)

## 2016-02-04 LAB — CBC WITH DIFFERENTIAL/PLATELET
BASO%: 0.2 % (ref 0.0–2.0)
Basophils Absolute: 0 10*3/uL (ref 0.0–0.1)
EOS ABS: 0.1 10*3/uL (ref 0.0–0.5)
EOS%: 2.2 % (ref 0.0–7.0)
HCT: 34.6 % — ABNORMAL LOW (ref 34.8–46.6)
HEMOGLOBIN: 11.8 g/dL (ref 11.6–15.9)
LYMPH#: 1.8 10*3/uL (ref 0.9–3.3)
LYMPH%: 43.3 % (ref 14.0–49.7)
MCH: 32 pg (ref 25.1–34.0)
MCHC: 34.1 g/dL (ref 31.5–36.0)
MCV: 93.8 fL (ref 79.5–101.0)
MONO#: 0.5 10*3/uL (ref 0.1–0.9)
MONO%: 11.1 % (ref 0.0–14.0)
NEUT%: 43.2 % (ref 38.4–76.8)
NEUTROS ABS: 1.8 10*3/uL (ref 1.5–6.5)
Platelets: 194 10*3/uL (ref 145–400)
RBC: 3.69 10*6/uL — ABNORMAL LOW (ref 3.70–5.45)
RDW: 14.2 % (ref 11.2–14.5)
WBC: 4.2 10*3/uL (ref 3.9–10.3)

## 2016-02-04 MED ORDER — CAPECITABINE 500 MG PO TABS: ORAL_TABLET | ORAL | Status: DC

## 2016-02-04 MED ORDER — CAPECITABINE 500 MG PO TABS: 1000.0000 mg/m2 | ORAL_TABLET | Freq: Two times a day (BID) | ORAL | Status: DC

## 2016-02-04 MED ORDER — ALPRAZOLAM 0.25 MG PO TABS: 0.2500 mg | ORAL_TABLET | Freq: Every evening | ORAL | Status: DC | PRN

## 2016-02-04 MED ORDER — OXYCODONE HCL 5 MG PO TABS: 5.0000 mg | ORAL_TABLET | ORAL | Status: DC | PRN

## 2016-02-04 NOTE — Progress Notes (Signed)
[name]  [phone] Fax:([phone]) [phone]  Clinic follow up Note   Patient Care Team: Carol Ada, MD as PCP - General (Family Medicine) Rolm Bookbinder, MD as Consulting Physician (General Surgery) 02/04/2016   CHIEF COMPLAINTS:  Follow up stage III colon cancer   Oncology History   Cancer of right colon [facility])   Staging form: Colon and Rectum, AJCC 7th Edition     Pathologic stage from 08/03/2015: Stage IIIB (T4a, N1a, cM0) - Signed by Truitt Merle, MD on 09/04/2015       Cancer of right colon ([place])   03/31/2015 Imaging CT abdomen showed appendicitis, perforated, with periappendiceal abscess. And small pulmonary nodules.   04/03/2015 Procedure CT guided placement of going Through into the right lower abdominal quadrant with aspiration of a total of 30 mL prudent fluid.    08/03/2015 Initial Diagnosis Cancer of right colon ([place])   08/03/2015 Surgery Right hemicolectomy   08/03/2015 Pathology Results Right hemicolectomy showed invasive adenocarcinoma, moderately differentiated, likely arising from a cecal valve. One out of 40 lymph nodes were positive, (+) LVI, margins negative    08/03/2015 Miscellaneous colon cance MSI-stable     HISTORY OF PRESENTING ILLNESS:  [name] 50 y.o. female without significant PMH is here because of her recently diagnosed stage III colon cancer. She is accompanied by her boyfriend to the clinic today.  She developed fever and headache in September 2016, was found to have ruptured appendix and was admitted to hospital. CT scan revealed a peri-appendiceal abscess. She underwent abscess drainage tube basement by interventional radiology on 04/17/2015, and a course of antibiotics. She had a multiple drainage tube exchange by IR, but the abscess did not heal. She was referred to Dr. Donne [name]. Multiple CT scan in October and November showed persistent peri-appendiceal fluid collection. She was finally taken to the operation room on  08/03/2015. Intraoperatively, she was found to have a 2 cm whole present in the cecum, she underwent ileocectomy with an anastomosis in the hepatic fixture. 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  She had wound infection issue after surgery. It has been healing slowly, she is still on wet-to-dry dressing change twice daily, has home care nurse. She has moderate pain, 6-7/10, sometime postional, she is on oxycodone 2-3 times a day. Her appetite and eating is getting better, lost about 25lb since 03/2015, no fever and chills for the past 3-4 days, just finished abx yesterday.   CURRENT THERAPY: adjuvant chemotherapy CAPEOX, capecitabine 155m bid on Day1-14, oxaliplatin 1325mm2 on D1, every 21 days, started on 09/18/2015, plan for 8 cycles, oxaliplatin was held from cycle 2 to 4 due to poor tolerance, restarted from cycle 5 with lower dose    INTERIM HISTORY:  RoLaquannaeturns for follow-up. She tolerated her 6th cycle chemo  moderately well. We increased her oxliplatin dose last cycle, she developed stomach discomfort, and headches after the intravenous chemo, which lasted about week. We decreased her Xeloda dose slightly last cycle, and she tolerated well, hand-foot syndrome much improved. She only has mild skin erythema, no pitting or skin cracks. She has been feeling well this week, with good appetite and energy level. She has body achiness, for which she takes oxycodone one tablet at night. She also sleeps well with Xanax. She only missed one day of work in the past 3 weeks. No other complaints.  MEDICAL HISTORY:  Past Medical History  Diagnosis Date  . Anemia   . Depression   .  Anxiety   . JP drain bleeding   . GERD (gastroesophageal reflux disease)   . Fibromyalgia     SURGICAL HISTORY: Past Surgical History  Procedure Laterality Date  . Abdominal hysterectomy    . Gastric bypass    . Colon surgery  08/03/2015  . Laparoscopic ileocecectomy Right 08/03/2015     Procedure: LAPAROSCOPIC DIAGNOSTIC RIGHT COLECTOMY;  Surgeon: Rolm Bookbinder, MD;  Location: [place];  Service: General;  Laterality: Right;  . Colostomy revision N/A 08/03/2015    Procedure: COLON RESECTION RIGHT;  Surgeon: Rolm Bookbinder, MD;  Location: [facility] [place];  Service: General;  Laterality: N/A;    SOCIAL HISTORY: Social History   Social History  . Marital Status: Married    Spouse Name: N/A  . Number of Children: N/A  . Years of Education: N/A   Occupational History  . Not on file.   Social History Main Topics  . Smoking status: Never Smoker   . Smokeless tobacco: Never Used  . Alcohol Use: 1.2 oz/week    2 Glasses of wine per week     Comment: moderate 1-2 times a month   . Drug Use: No  . Sexual Activity: Not on file   Other Topics Concern  . Not on file   Social History Narrative   Single, fiance [name]   Works [employer] 11:30am to 8 pm    FAMILY HISTORY: Family History  Problem Relation Age of Onset  . Cancer Father 21    small cell lung cancer and colon cancer   . Cancer Sister 50    small cell lung cancer  . Cancer Maternal Aunt     ovarian cancer  . Cancer Paternal Uncle     brain cancer   . Cancer Other 40    ovarian cancer (maternal niece)    ALLERGIES:  is allergic to latex; cymbalta; and penicillins.  MEDICATIONS:  Current Outpatient Prescriptions  Medication Sig Dispense Refill  . acetaminophen (TYLENOL) 500 MG tablet Take 1,000 mg by mouth every 6 (six) hours as needed for fever. Reported on 09/24/2015    . ALPRAZolam (XANAX) 0.25 MG tablet Take 1-2 tablets (0.25-0.5 mg total) by mouth at bedtime as needed for anxiety or sleep. 40 tablet 0  . [START ON 02/05/2016] capecitabine (XELODA) 500 MG tablet Take 3 tab in the morning and 2 tab in evening. Take on days 1-14 of chemotherapy, then 7 days off 70 tablet 2  . cyclobenzaprine (FLEXERIL) 10 MG tablet Take 1 tablet (10 mg total) by mouth 3 (three) times daily as needed for  muscle spasms. 30 tablet 0  . dexamethasone (DECADRON) 4 MG tablet Take 2 tablets (8 mg total) by mouth daily. Start day after chemo and take for 3 days after each IV chemo 30 tablet 1  . diphenhydrAMINE (BENADRYL) 25 mg capsule Take 25 mg by mouth every 6 (six) hours as needed for sleep.    Marland Kitchen escitalopram (LEXAPRO) 10 MG tablet Take 10 mg by mouth daily.    . ferrous sulfate 325 (65 FE) MG tablet Take 325 mg by mouth 2 (two) times daily with a meal. Reported on 10/12/2015    . ondansetron (ZOFRAN) 8 MG tablet Take 1 tablet (8 mg total) by mouth as directed. Take twice daily as needed for nausea-start on day 3 of chemotherapy 30 tablet 1  . oxyCODONE (OXY IR/ROXICODONE) 5 MG immediate release tablet Take 1 tablet (5 mg total) by mouth every 4 (four) hours as  needed for severe pain. 40 tablet 0  . potassium chloride SA (K-DUR,KLOR-CON) 20 MEQ tablet Take 1 tab PO TID x 1 day; then take 1 tab PO BID till gone. 14 tablet 1  . prochlorperazine (COMPAZINE) 10 MG tablet Take 1 tablet (10 mg total) by mouth every 8 (eight) hours as needed for nausea or vomiting. (Patient not taking: Reported on 01/05/2016) 30 tablet 1  . traMADol (ULTRAM) 50 MG tablet Take 1-2 tablets (50-100 mg total) by mouth every 6 (six) hours as needed. 45 tablet 0  . urea (CARMOL) 10 % cream Apply topically as needed. 71 g 1  . vitamin B-12 (CYANOCOBALAMIN) 1000 MCG tablet Take 500 mcg by mouth daily. Reported on 09/24/2015    . vitamin C (ASCORBIC ACID) 500 MG tablet Take 500 mg by mouth daily.     No current facility-administered medications for this visit.   Facility-Administered Medications Ordered in Other Visits  Medication Dose Route Frequency Provider Last Rate Last Dose  . clindamycin (CLEOCIN) 900 mg in dextrose 5 % 50 mL IVPB  900 mg Intravenous 60 min Pre-Op Rolm Bookbinder, MD       And  . gentamicin (GARAMYCIN) 310 mg in dextrose 5 % 50 mL IVPB  5 mg/kg Intravenous 60 min Pre-Op Rolm Bookbinder, MD        REVIEW  OF SYSTEMS:   Constitutional: Denies fevers, chills or abnormal night sweats Eyes: Denies blurriness of vision, double vision or watery eyes Ears, nose, mouth, throat, and face: Denies mucositis or sore throat Respiratory: Denies cough, dyspnea or wheezes Cardiovascular: Denies palpitation, chest discomfort or lower extremity swelling Gastrointestinal:  Denies nausea, heartburn or change in bowel habits Skin: Denies abnormal skin rashes Lymphatics: Denies new lymphadenopathy or easy bruising Neurological:Denies numbness, tingling or new weaknesses Behavioral/Psych: Mood is stable, no new changes  All other systems were reviewed with the patient and are negative.  PHYSICAL EXAMINATION: ECOG PERFORMANCE STATUS: 1 - Symptomatic but completely ambulatory  Filed Vitals:   02/04/16 1516  BP: 118/73  Pulse: 87  Temp: 98.4 F (36.9 C)  Resp: 20   Filed Weights   02/04/16 1516  Weight: 134 lb 8 oz (61.009 kg)    GENERAL:alert, no distress and comfortable SKIN: skin color, texture, turgor are normal, no rashes or significant lesions EYES: normal, conjunctiva are pink and non-injected, sclera clear OROPHARYNX:no exudate, no erythema and lips, buccal mucosa, and tongue normal  NECK: supple, thyroid normal size, non-tender, without nodularity LYMPH:  no palpable lymphadenopathy in the cervical, axillary or inguinal   LUNGS: clear to auscultation and percussion with normal breathing effort HEART: regular rate & rhythm and no murmurs and no lower extremity edema ABDOMEN:abdomen soft, non-tender and normal bowel sounds,  Her midline surgical wound has completely healed, and appears to be dry without any discharge.  Musculoskeletal:no cyanosis of digits and no clubbing  PSYCH: alert & oriented x 3 with fluent speech NEURO: no focal motor/sensory deficits  LABORATORY DATA:  I have reviewed the data as listed CBC Latest Ref Rng 02/04/2016 01/15/2016 12/18/2015  WBC 3.9 - 10.3 10e3/uL 4.2 4.5  5.3  Hemoglobin 11.6 - 15.9 g/dL 11.8 12.2 12.4  Hematocrit 34.8 - 46.6 % 34.6(L) 36.6 36.9  Platelets 145 - 400 10e3/uL 194 208 185   CMP Latest Ref Rng 02/04/2016 01/15/2016 12/18/2015  Glucose 70 - 140 mg/dl 95 106 83  BUN 7.0 - 26.0 mg/dL 5.9(L) 9.6 9.9  Creatinine 0.6 - 1.1 mg/dL 0.7 0.7 0.7  Sodium 136 - 145 mEq/L 141 142 142  Potassium 3.5 - 5.1 mEq/L 4.1 4.1 3.9  CO2 22 - 29 mEq/L 25 25 24   Calcium 8.4 - 10.4 mg/dL 9.2 9.1 9.8  Total Protein 6.4 - 8.3 g/dL 6.8 7.1 7.4  Total Bilirubin 0.20 - 1.20 mg/dL 0.42 0.37 0.37  Alkaline Phos 40 - 150 U/L 125 125 99  AST 5 - 34 U/L 19 16 14   ALT 0 - 55 U/L 12 12 9    PATHOLOGY REPORT Diagnosis 08/03/2015 Colon, segmental resection, Right - INVASIVE ADENOCARCINOMA WITH ABUNDANT EXTRACELLULAR MUCIN, MODERATELY DIFFERENTIATED, LIKELY ARISING FROM ILEOCECAL VALVE. - ADENOCARCINOMA EXTENDS AT LEAST INTO THE PERICOLONIC SOFT TISSUE AND IS ASSOCIATED WITH TRANSMURAL DEFECT. - THE PROXIMAL AND DISTAL RESECTION MARGINS ARE NEGATIVE FOR ADENOCARCINOMA. - ACELLULAR MUCIN IS GROSSLY PRESENT AT THE SEROSAL SURFACE. - LYMPHOVASCULAR INVASION IS IDENTIFIED. - METASTATIC CARCINOMA IN 1 OF 40 LYMPH NODES (1/40). - SEE ONCOLOGY TABLE BELOW. Microscopic Comment COLON AND RECTUM (INCLUDING TRANS-ANAL RESECTION): Specimen: Right colon and terminal ileum. Procedure: Resection. Tumor site: Likely ileocecal valve. Specimen integrity: Transmural defect(s). Macroscopic intactness of mesorectum: N/A Macroscopic tumor perforation: Present. Invasive tumor: Maximum size: At least 2.5 cm. Histologic type(s): Adenocarcinoma with abundant extracellular mucin. Histologic grade and differentiation: G2: moderately differentiated. Type of polyp in which invasive carcinoma arose: Tubular adenoma. Microscopic extension of invasive tumor: Adenocarcinoma extends into pericolonic soft tissue and acellular mucin is grossly present at the serosal surface. Lymph-Vascular  invasion: Present. Peri-neural invasion: Not identified. Tumor deposit(s) (discontinuous extramural extension): Not identified. Resection margins: Proximal margin: Negative for carcinoma. Distal margin: Negative for carcinoma. Circumferential (radial) (posterior ascending, posterior descending; lateral and posterior mid-rectum; and entire lower 1/3 rectum): Acellular mucin is present at the circumferential tissue edge. Treatment effect (neo-adjuvant therapy): N/A Additional polyp(s): Not identified. Non-neoplastic findings: No significant findings. Lymph nodes: number examined 40; number positive: 1 Pathologic Staging: at least pT4a, pN1a. Ancillary studies: MMR by IHC and MSI by PCR will be performed and the results reported separately. Additional studies can be performed upon clinician request.   ADDITIONAL INFORMATION: Mismatch Repair (MMR) Protein Immunohistochemistry (IHC) IHC Expression Result: MLH1: EQUIVOCAL MSH2: EQUIVOCAL MSH6: EQUIVOCAL PMS2: EQUIVOCAL * Internal control demonstrates intact nuclear expression Interpretation: EQUIVOCAL The tumor shows partial staining with all anibodies and hence the results are considered equivocal. Correlated with molecular based MSI testing is strongly recommended.    RADIOGRAPHIC STUDIES: I have personally reviewed the radiological images as listed and agreed with the findings in the report. No results found.  ASSESSMENT & PLAN:  50 year old Caucasian female, with past medical history of fibromyalgia, depression, presented with ruptured cecum and pericolonic abscess, required multiple drainage, antibiotics, and eventually right colectomy.  1. Right colon cancer, cecum, moderately differentiated adenocarcinoma, pT4N1aM0, stage IIIA, with perforation, MSI-stable  - I reviewed her scan findings, and surgical pathology results in great details with patient and her boyfriend. -Her CT chest was negative for metastasis. - she has  locally advanced stage III  Disease, with particular high risk features of T4 tumor, proliferation for 3-4 months,  And positive lymph nodes, she is at very high risk for cancer recurrence, especially peritoneal carcinomatosis. - I strongly recommend her to consider adjuvant chemotherapy, FOLFOX or CAPEOX. She opted CAPEOX,with oxaliplatin 130 mg/m, on day one, and capecitabien 1000 mg/m twice daily, on day 1-14, every 21 day cycle, a total of 8 cycles  -She received the first dose of oxaliplatin and 1 day of Xeloda, tolerated poorly. Oxaliplatin has  been held since then, and restarted from cycle 5 with 50% dose reduction. She also had significant hand and foot syndrome, and Xeloda dose was reduced from 3015m/day to 25031mday  -We tried slightly higher dose of oxaliplatin, she did not tolerate well, will decrease back to 6547m2 -She'll continue Xeloda 2500 mg/day, possible increased to 3000 mg per day for the last 5-7 days of each cycle -Due to her multiple dose reduction and delay, I recommended her get one more cycle (cycle 9) CAPOX, she agrees   2. Abdominal wound, history of abdominal abscess -The wound has completely healed now  3. Nausea from chemotherapy therapy -She will continue zofran and compazine as needed   4. Anemia, iron deficient anemia  - likely secondary to surgery,  Chronic infection and GI bleeding from the tumor -Iron study today showed low serum iron, transferrin saturation and ferritin 12, which is consistent with iron deficiency -She received IV Feraheme twice, responded well. Anemia resolved now -I encouraged her to continue iron pill over-the-counter 1 tablet twice daily.   5. Fibromyalgia and depression - she will continue medication, and follow-up with her primary care physician -She takes oxycodone and Xanax 1 tablet a day and night, I reviewed for her  Plan -I refilled her oxycodone, Xanax and Xeloda -Start cycle 7 CAPOX tomorrow, Xeloda 2500m23my (may  increase to 3000mg31m for the last 5-7 days) for 2 weeks on and one week off, and oxaliplatin 65 mg/m today (50% dose reduction) -She will see APP Ms BaconBerniece Salines weeks before cycle 8, and I'll see her back in 6 weeks before her last cycle chemotherapy.  All questions were answered. The patient knows to call the clinic with any problems, questions or concerns. I spent 25 minutes counseling the patient face to face. The total time spent in the appointment was 30 minutes and more than 50% was on counseling.     Barclay Lennox,Truitt Merle7/13/2017

## 2016-02-05 ENCOUNTER — Telehealth: Payer: Self-pay | Admitting: Hematology

## 2016-02-05 ENCOUNTER — Ambulatory Visit (HOSPITAL_BASED_OUTPATIENT_CLINIC_OR_DEPARTMENT_OTHER): Payer: Managed Care, Other (non HMO)

## 2016-02-05 VITALS — BP 116/75 | HR 76 | Temp 97.7°F | Resp 20

## 2016-02-05 DIAGNOSIS — Z5111 Encounter for antineoplastic chemotherapy: Secondary | ICD-10-CM | POA: Diagnosis not present

## 2016-02-05 DIAGNOSIS — D509 Iron deficiency anemia, unspecified: Secondary | ICD-10-CM

## 2016-02-05 DIAGNOSIS — C182 Malignant neoplasm of ascending colon: Secondary | ICD-10-CM | POA: Diagnosis not present

## 2016-02-05 LAB — CEA: CEA1: 2.5 ng/mL (ref 0.0–4.7)

## 2016-02-05 MED ORDER — OXALIPLATIN CHEMO INJECTION 100 MG/20ML
62.0000 mg/m2 | Freq: Once | INTRAVENOUS | Status: AC
  Administered 2016-02-05: 100 mg via INTRAVENOUS
  Filled 2016-02-05: qty 20

## 2016-02-05 MED ORDER — PALONOSETRON HCL INJECTION 0.25 MG/5ML
0.2500 mg | Freq: Once | INTRAVENOUS | Status: AC
  Administered 2016-02-05: 0.25 mg via INTRAVENOUS

## 2016-02-05 MED ORDER — SODIUM CHLORIDE 0.9 % IV SOLN
10.0000 mg | Freq: Once | INTRAVENOUS | Status: AC
  Administered 2016-02-05: 10 mg via INTRAVENOUS
  Filled 2016-02-05: qty 1

## 2016-02-05 MED ORDER — PALONOSETRON HCL INJECTION 0.25 MG/5ML
INTRAVENOUS | Status: AC
Start: 2016-02-05 — End: 2016-02-05
  Filled 2016-02-05: qty 5

## 2016-02-05 MED ORDER — DEXTROSE 5 % IV SOLN
Freq: Once | INTRAVENOUS | Status: AC
  Administered 2016-02-05: 16:00:00 via INTRAVENOUS

## 2016-02-05 MED ORDER — SODIUM CHLORIDE 0.9 % IV SOLN
Freq: Once | INTRAVENOUS | Status: AC
  Administered 2016-02-05: 15:00:00 via INTRAVENOUS

## 2016-02-05 NOTE — Patient Instructions (Signed)
[facility] Discharge Instructions for Patients Receiving Chemotherapy  Today you received the following chemotherapy agents Oxaliplatin  To help prevent nausea and vomiting after your treatment, we encourage you to take your nausea medication directed.  Take Compazine if needed for up to 3 days following chemo.   If you develop nausea and vomiting that is not controlled by your nausea medication, call the clinic.   BELOW ARE SYMPTOMS THAT SHOULD BE REPORTED IMMEDIATELY:  *FEVER GREATER THAN 100.5 F  *CHILLS WITH OR WITHOUT FEVER  NAUSEA AND VOMITING THAT IS NOT CONTROLLED WITH YOUR NAUSEA MEDICATION  *UNUSUAL SHORTNESS OF BREATH  *UNUSUAL BRUISING OR BLEEDING  TENDERNESS IN MOUTH AND THROAT WITH OR WITHOUT PRESENCE OF ULCERS  *URINARY PROBLEMS  *BOWEL PROBLEMS  UNUSUAL RASH Items with * indicate a potential emergency and should be followed up as soon as possible.  Feel free to call the clinic you have any questions or concerns. The clinic phone number is [phone].  Please show the [facility] at check-in to the Emergency Department and triage nurse.  Oxaliplatin Injection What is this medicine? OXALIPLATIN (ox AL i PLA tin) is a chemotherapy drug. It targets fast dividing cells, like cancer cells, and causes these cells to die. This medicine is used to treat cancers of the colon and rectum, and many other cancers. This medicine may be used for other purposes; ask your health care provider or pharmacist if you have questions. What should I tell my health care provider before I take this medicine? They need to know if you have any of these conditions: -kidney disease -an unusual or allergic reaction to oxaliplatin, other chemotherapy, other medicines, foods, dyes, or preservatives -pregnant or trying to get pregnant -breast-feeding How should I use this medicine? This drug is given as an infusion into a vein. It is administered in a hospital or  clinic by a specially trained health care professional. Talk to your pediatrician regarding the use of this medicine in children. Special care may be needed. Overdosage: If you think you have taken too much of this medicine contact a poison control center or emergency room at once. NOTE: This medicine is only for you. Do not share this medicine with others. What if I miss a dose? It is important not to miss a dose. Call your doctor or health care professional if you are unable to keep an appointment. What may interact with this medicine? -medicines to increase blood counts like filgrastim, pegfilgrastim, sargramostim -probenecid -some antibiotics like amikacin, gentamicin, neomycin, polymyxin B, streptomycin, tobramycin -zalcitabine Talk to your doctor or health care professional before taking any of these medicines: -acetaminophen -aspirin -ibuprofen -ketoprofen -naproxen This list may not describe all possible interactions. Give your health care provider a list of all the medicines, herbs, non-prescription drugs, or dietary supplements you use. Also tell them if you smoke, drink alcohol, or use illegal drugs. Some items may interact with your medicine. What should I watch for while using this medicine? Your condition will be monitored carefully while you are receiving this medicine. You will need important blood work done while you are taking this medicine. This medicine can make you more sensitive to cold. Do not drink cold drinks or use ice. Cover exposed skin before coming in contact with cold temperatures or cold objects. When out in cold weather wear warm clothing and cover your mouth and nose to warm the air that goes into your lungs. Tell your doctor if you get sensitive  to the cold. This drug may make you feel generally unwell. This is not uncommon, as chemotherapy can affect healthy cells as well as cancer cells. Report any side effects. Continue your course of treatment even though  you feel ill unless your doctor tells you to stop. In some cases, you may be given additional medicines to help with side effects. Follow all directions for their use. Call your doctor or health care professional for advice if you get a fever, chills or sore throat, or other symptoms of a cold or flu. Do not treat yourself. This drug decreases your body's ability to fight infections. Try to avoid being around people who are sick. This medicine may increase your risk to bruise or bleed. Call your doctor or health care professional if you notice any unusual bleeding. Be careful brushing and flossing your teeth or using a toothpick because you may get an infection or bleed more easily. If you have any dental work done, tell your dentist you are receiving this medicine. Avoid taking products that contain aspirin, acetaminophen, ibuprofen, naproxen, or ketoprofen unless instructed by your doctor. These medicines may hide a fever. Do not become pregnant while taking this medicine. Women should inform their doctor if they wish to become pregnant or think they might be pregnant. There is a potential for serious side effects to an unborn child. Talk to your health care professional or pharmacist for more information. Do not breast-feed an infant while taking this medicine. Call your doctor or health care professional if you get diarrhea. Do not treat yourself. What side effects may I notice from receiving this medicine? Side effects that you should report to your doctor or health care professional as soon as possible: -allergic reactions like skin rash, itching or hives, swelling of the face, lips, or tongue -low blood counts - This drug may decrease the number of white blood cells, red blood cells and platelets. You may be at increased risk for infections and bleeding. -signs of infection - fever or chills, cough, sore throat, pain or difficulty passing urine -signs of decreased platelets or bleeding -  bruising, pinpoint red spots on the skin, black, tarry stools, nosebleeds -signs of decreased red blood cells - unusually weak or tired, fainting spells, lightheadedness -breathing problems -chest pain, pressure -cough -diarrhea -jaw tightness -mouth sores -nausea and vomiting -pain, swelling, redness or irritation at the injection site -pain, tingling, numbness in the hands or feet -problems with balance, talking, walking -redness, blistering, peeling or loosening of the skin, including inside the mouth -trouble passing urine or change in the amount of urine Side effects that usually do not require medical attention (report to your doctor or health care professional if they continue or are bothersome): -changes in vision -constipation -hair loss -loss of appetite -metallic taste in the mouth or changes in taste -stomach pain This list may not describe all possible side effects. Call your doctor for medical advice about side effects. You may report side effects to FDA at 1-800-FDA-1088. Where should I keep my medicine? This drug is given in a hospital or clinic and will not be stored at home. NOTE: This sheet is a summary. It may not cover all possible information. If you have questions about this medicine, talk to your doctor, pharmacist, or health care provider.    2016, [employer]. (2008-02-05 17:22:47)

## 2016-02-05 NOTE — Telephone Encounter (Signed)
per pof to sch pt appt-pt to get updated copy b4 leaving trmt today-cld pt and left message of time & date of appt

## 2016-02-08 ENCOUNTER — Other Ambulatory Visit: Payer: Self-pay | Admitting: Nurse Practitioner

## 2016-02-09 ENCOUNTER — Other Ambulatory Visit: Payer: Self-pay | Admitting: *Deleted

## 2016-02-09 DIAGNOSIS — C182 Malignant neoplasm of ascending colon: Secondary | ICD-10-CM

## 2016-02-09 MED ORDER — TRAMADOL HCL 50 MG PO TABS: 50.0000 mg | ORAL_TABLET | Freq: Four times a day (QID) | ORAL | Status: DC | PRN

## 2016-02-12 ENCOUNTER — Telehealth: Payer: Self-pay | Admitting: *Deleted

## 2016-02-12 NOTE — Telephone Encounter (Signed)
Pt called 02/11/16 and left message requesting medications for burning in stomach.  Spoke with pt on 02/11/16, and was informed that pt experiencing burning in stomach - like ulcers pain flare up.  Stated taking Pepcid with some relief.  Also stated feeling like gas pain.   Dr. Burr Medico notified.   Spoke with pt again and instructed pt to take Prilosec for burning symptom and take Maalox for gas pain as per md's instructions.  Pt voiced understanding. Pt's   Phone    [phone].

## 2016-02-15 ENCOUNTER — Telehealth: Payer: Self-pay | Admitting: *Deleted

## 2016-02-15 NOTE — Telephone Encounter (Signed)
Received call from pt requesting a call back from nurse.   Spoke with pt and was informed that pt has been nauseated; vomited  X 1 today.  Stated she had been taking Prilosec and Maalox to help with nausea and gas pain.  Stated bowel and bladder function fine;  Eating ok and has been drinking lots of caffeine.  Instructed pt to limit caffeine intake, increased Water and Gatorade intake as tolerated.   Instructed pt to take Zofran BID as needed for nausea;  Pt can also take Compazine every 8 hours as needed between Zofran frequency.  Cautioned pt not to drive after taking Compazine since it can cause drowsiness.  Pt can continue to take Maalox for gas pain as needed. Pt is currently on her second week of Xeloda 1500 mg in AM and 1000 mg in PM.  Will complete the cycle on Sat 02/20/16.   Pt voiced understanding of the instructions above.  Pt to call office back early in the am 02/16/16 to give nurse update on her progress.  Dr. Burr Medico notified. Pt's   Phone     [phone].

## 2016-02-16 ENCOUNTER — Other Ambulatory Visit: Payer: Managed Care, Other (non HMO)

## 2016-02-18 ENCOUNTER — Telehealth: Payer: Self-pay | Admitting: *Deleted

## 2016-02-18 NOTE — Telephone Encounter (Signed)
Pt called this am requesting a call back from nurse.  Spoke with pt and was informed that pt has had constipation problem.  Pt has to take stool softeners to help.  Stated she had to strain for bms yesterday, and again this am.  Pt noted blood on stools this am, but very little blood noted - almost none on her underwear.   Instructed pt to continue monitoring for any more bleeding , and to call office immediately.   Pt voiced understanding. Pt's    Phone      [phone].

## 2016-02-26 ENCOUNTER — Other Ambulatory Visit (HOSPITAL_BASED_OUTPATIENT_CLINIC_OR_DEPARTMENT_OTHER): Payer: Managed Care, Other (non HMO)

## 2016-02-26 ENCOUNTER — Ambulatory Visit (HOSPITAL_BASED_OUTPATIENT_CLINIC_OR_DEPARTMENT_OTHER): Payer: Managed Care, Other (non HMO)

## 2016-02-26 ENCOUNTER — Ambulatory Visit (HOSPITAL_BASED_OUTPATIENT_CLINIC_OR_DEPARTMENT_OTHER): Payer: Managed Care, Other (non HMO) | Admitting: Nurse Practitioner

## 2016-02-26 VITALS — BP 120/79 | HR 98 | Temp 97.8°F | Resp 20 | Ht 61.0 in | Wt 132.3 lb

## 2016-02-26 DIAGNOSIS — C182 Malignant neoplasm of ascending colon: Secondary | ICD-10-CM

## 2016-02-26 DIAGNOSIS — L271 Localized skin eruption due to drugs and medicaments taken internally: Secondary | ICD-10-CM | POA: Diagnosis not present

## 2016-02-26 DIAGNOSIS — C180 Malignant neoplasm of cecum: Secondary | ICD-10-CM

## 2016-02-26 DIAGNOSIS — Z5111 Encounter for antineoplastic chemotherapy: Secondary | ICD-10-CM | POA: Diagnosis not present

## 2016-02-26 LAB — COMPREHENSIVE METABOLIC PANEL WITH GFR
ALT: 14 U/L (ref 0–55)
AST: 17 U/L (ref 5–34)
Albumin: 3.7 g/dL (ref 3.5–5.0)
Alkaline Phosphatase: 131 U/L (ref 40–150)
Anion Gap: 12 meq/L — ABNORMAL HIGH (ref 3–11)
BUN: 9.1 mg/dL (ref 7.0–26.0)
CO2: 22 meq/L (ref 22–29)
Calcium: 9.5 mg/dL (ref 8.4–10.4)
Chloride: 105 meq/L (ref 98–109)
Creatinine: 0.7 mg/dL (ref 0.6–1.1)
EGFR: >90 ml/min/1.73 m2
Glucose: 110 mg/dL (ref 70–140)
Potassium: 3.9 meq/L (ref 3.5–5.1)
Sodium: 139 meq/L (ref 136–145)
Total Bilirubin: 0.35 mg/dL (ref 0.20–1.20)
Total Protein: 7.4 g/dL (ref 6.4–8.3)

## 2016-02-26 LAB — CBC WITH DIFFERENTIAL/PLATELET
BASO%: 0.1 % (ref 0.0–2.0)
Basophils Absolute: 0 10e3/uL (ref 0.0–0.1)
EOS%: 0 % (ref 0.0–7.0)
Eosinophils Absolute: 0 10e3/uL (ref 0.0–0.5)
HCT: 34.9 % (ref 34.8–46.6)
HGB: 11.8 g/dL (ref 11.6–15.9)
LYMPH%: 10.3 % — ABNORMAL LOW (ref 14.0–49.7)
MCH: 31.8 pg (ref 25.1–34.0)
MCHC: 33.8 g/dL (ref 31.5–36.0)
MCV: 94.1 fL (ref 79.5–101.0)
MONO#: 0.1 10e3/uL (ref 0.1–0.9)
MONO%: 1.9 % (ref 0.0–14.0)
NEUT#: 6.1 10e3/uL (ref 1.5–6.5)
NEUT%: 87.7 % — ABNORMAL HIGH (ref 38.4–76.8)
Platelets: 218 10e3/uL (ref 145–400)
RBC: 3.71 10e6/uL (ref 3.70–5.45)
RDW: 13.9 % (ref 11.2–14.5)
WBC: 7 10e3/uL (ref 3.9–10.3)
lymph#: 0.7 10e3/uL — ABNORMAL LOW (ref 0.9–3.3)

## 2016-02-26 MED ORDER — PALONOSETRON HCL INJECTION 0.25 MG/5ML
INTRAVENOUS | Status: AC
  Filled 2016-02-26: qty 5

## 2016-02-26 MED ORDER — SODIUM CHLORIDE 0.9 % IV SOLN
10.0000 mg | Freq: Once | INTRAVENOUS | Status: AC
  Administered 2016-02-26: 10 mg via INTRAVENOUS
  Filled 2016-02-26: qty 1

## 2016-02-26 MED ORDER — OXALIPLATIN CHEMO INJECTION 100 MG/20ML
62.0000 mg/m2 | Freq: Once | INTRAVENOUS | Status: AC
  Administered 2016-02-26: 100 mg via INTRAVENOUS
  Filled 2016-02-26: qty 20

## 2016-02-26 MED ORDER — PALONOSETRON HCL INJECTION 0.25 MG/5ML
0.2500 mg | Freq: Once | INTRAVENOUS | Status: AC
  Administered 2016-02-26: 0.25 mg via INTRAVENOUS

## 2016-02-26 MED ORDER — DEXTROSE 5 % IV SOLN
Freq: Once | INTRAVENOUS | Status: AC
  Administered 2016-02-26: 15:00:00 via INTRAVENOUS

## 2016-02-26 NOTE — Patient Instructions (Signed)
[facility] Discharge Instructions for Patients Receiving Chemotherapy  Today you received the following chemotherapy agents:  Oxaliplatin  To help prevent nausea and vomiting after your treatment, we encourage you to take your nausea medication as prescribed.   If you develop nausea and vomiting that is not controlled by your nausea medication, call the clinic.   BELOW ARE SYMPTOMS THAT SHOULD BE REPORTED IMMEDIATELY:  *FEVER GREATER THAN 100.5 F  *CHILLS WITH OR WITHOUT FEVER  NAUSEA AND VOMITING THAT IS NOT CONTROLLED WITH YOUR NAUSEA MEDICATION  *UNUSUAL SHORTNESS OF BREATH  *UNUSUAL BRUISING OR BLEEDING  TENDERNESS IN MOUTH AND THROAT WITH OR WITHOUT PRESENCE OF ULCERS  *URINARY PROBLEMS  *BOWEL PROBLEMS  UNUSUAL RASH Items with * indicate a potential emergency and should be followed up as soon as possible.  Feel free to call the clinic you have any questions or concerns. The clinic phone number is [phone].  Please show the CHEMO ALERT CARD at check-in to the Emergency Department and triage nurse.   

## 2016-02-29 ENCOUNTER — Telehealth: Payer: Self-pay | Admitting: *Deleted

## 2016-02-29 NOTE — Telephone Encounter (Signed)
Oncology Nurse Navigator Documentation  Oncology Nurse Navigator Flowsheets 02/29/2016  Navigator Location [facility]  Navigator Encounter Type Telephone  Telephone Patient Update  Abnormal Finding Date -  Confirmed Diagnosis Date -  Surgery Date -  Treatment Initiated Date -  Patient Visit Type -  Treatment Phase Active Tx--last treatment on 8/25  Barriers/Navigation Needs No Questions;No barriers at this time;No Needs  Education -  Interventions None required--reports tolerating her tx well; sill working. 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  Coordination of Care -  Education Method -  Support Groups/Services -  Acuity -  Time Spent with Patient 15

## 2016-03-02 ENCOUNTER — Encounter: Payer: Self-pay | Admitting: Nurse Practitioner

## 2016-03-02 NOTE — Progress Notes (Signed)
Established Patient visit:    HPI:  [name] 50 y.o. female diagnosed with colon cancer.  Currently undergoing oxaliplatin infusions and oral Xeloda therapy.  Patient presents to the [facility] today to receive cycle 5 of that chemotherapy infusion.  Oncology History   Cancer of right colon [facility])   Staging form: Colon and Rectum, AJCC 7th Edition     Pathologic stage from 08/03/2015: Stage IIIB (T4a, N1a, cM0) - Signed by Truitt Merle, MD on 09/04/2015       Cancer of right colon ([place])   03/31/2015 Imaging    CT abdomen showed appendicitis, perforated, with periappendiceal abscess. And small pulmonary nodules.     04/03/2015 Procedure    CT guided placement of going Through into the right lower abdominal quadrant with aspiration of a total of 30 mL prudent fluid.      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 One out of 40 lymph nodes were positive, (+) LVI, margins negative      08/03/2015 Miscellaneous    colon cance MSI-stable       Review of Systems  All other systems reviewed and are negative.   Past Medical History:  Diagnosis Date  . Anemia   . Anxiety   . Depression   . Fibromyalgia   . GERD (gastroesophageal reflux disease)   . JP drain bleeding     Past Surgical History:  Procedure Laterality Date  . ABDOMINAL HYSTERECTOMY    . COLON SURGERY  08/03/2015  . COLOSTOMY REVISION N/A 08/03/2015   Procedure: COLON RESECTION RIGHT;  Surgeon: Rolm Bookbinder, MD;  Location: [place] [facility];  Service: General;  Laterality: N/A;  . GASTRIC BYPASS    . LAPAROSCOPIC ILEOCECECTOMY Right 08/03/2015   Procedure: LAPAROSCOPIC DIAGNOSTIC RIGHT COLECTOMY;  Surgeon: Rolm Bookbinder, MD;  Location: [place];  Service: General;  Laterality: Right;    has Perforated  appendicitis; Appendiceal abscess; Appendicitis, acute; S/P colectomy; Cancer of right colon ([place]); Anemia, iron deficiency; Dehydration; Hyponatremia; Hypotension; Transaminitis; Hyperphosphatemia; Hypoalbuminemia due to protein-calorie malnutrition ([place]); Insomnia; Open abdominal wall wound; and Hand foot syndrome on her problem list.    is allergic to latex; cymbalta [duloxetine hcl]; and penicillins.    Medication List       Accurate as of 02/26/16 11:59 PM. Always use your most recent med list.          acetaminophen 500 MG tablet Commonly known as:  TYLENOL Take 1,000 mg by mouth every 6 (six) hours as needed for fever. Reported on 09/24/2015   ALPRAZolam 0.25 MG tablet Commonly known as:  XANAX Take 1-2 tablets (0.25-0.5 mg total) by mouth at bedtime as needed for anxiety or sleep.   capecitabine 500 MG tablet Commonly known as:  XELODA Take 3 tab in the morning and 2 tab in evening. Take on days 1-14 of chemotherapy, then 7 days off   cyclobenzaprine 10 MG tablet Commonly known as:  FLEXERIL Take 1 tablet (10 mg total) by mouth 3 (three) times daily as needed for muscle spasms.   dexamethasone 4 MG tablet Commonly known as:  DECADRON Take 2 tablets (8 mg total) by mouth daily. Start day after chemo and take for 3 days after each IV chemo   diphenhydrAMINE 25 mg capsule Commonly known  as:  BENADRYL Take 25 mg by mouth every 6 (six) hours as needed for sleep.   escitalopram 10 MG tablet Commonly known as:  LEXAPRO Take 10 mg by mouth daily.   ferrous sulfate 325 (65 FE) MG tablet Take 325 mg by mouth 2 (two) times daily with a meal. Reported on 10/12/2015   ondansetron 8 MG tablet Commonly known as:  ZOFRAN Take 1 tablet (8 mg total) by mouth as directed. Take twice daily as needed for nausea-start on day 3 of chemotherapy   oxyCODONE 5 MG immediate release tablet Commonly known as:  Oxy IR/ROXICODONE Take 1 tablet (5 mg total) by mouth every 4 (four) hours as  needed for severe pain.   potassium chloride SA 20 MEQ tablet Commonly known as:  K-DUR,KLOR-CON Take 1 tab PO TID x 1 day; then take 1 tab PO BID till gone.   prochlorperazine 10 MG tablet Commonly known as:  COMPAZINE Take 1 tablet (10 mg total) by mouth every 8 (eight) hours as needed for nausea or vomiting.   traMADol 50 MG tablet Commonly known as:  ULTRAM Take 1-2 tablets (50-100 mg total) by mouth every 6 (six) hours as needed.   urea 10 % cream Commonly known as:  CARMOL Apply topically as needed.   vitamin B-12 1000 MCG tablet Commonly known as:  CYANOCOBALAMIN Take 500 mcg by mouth daily. Reported on 09/24/2015   vitamin C 500 MG tablet Commonly known as:  ASCORBIC ACID Take 500 mg by mouth daily.        PHYSICAL EXAMINATION  Oncology Vitals 02/26/2016 02/05/2016  Height 155 cm -  Weight 60.011 kg -  Weight (lbs) 132 lbs 5 oz -  BMI (kg/m2) 25 kg/m2 -  Temp 97.8 97.7  Pulse 98 76  Resp 20 20  SpO2 99 100  BSA (m2) 1.61 m2 -   BP Readings from Last 2 Encounters:  02/26/16 120/79  02/05/16 116/75    Physical Exam  Constitutional: She is oriented to person, place, and time and well-developed, well-nourished, and in no distress.  HENT:  Head: Normocephalic and atraumatic.  Mouth/Throat: Oropharynx is clear and moist.  Eyes: Conjunctivae and EOM are normal. Pupils are equal, round, and reactive to light.  Neck: Normal range of motion.  Pulmonary/Chest: Effort normal. No respiratory distress.  Musculoskeletal: Normal range of motion. She exhibits no edema, tenderness or deformity.  Neurological: She is alert and oriented to person, place, and time. Gait normal.  Skin: Skin is warm and dry. No rash noted. No erythema. No pallor.  Psychiatric: Affect normal.  Nursing note and vitals reviewed.   LABORATORY DATA:. Appointment on 02/26/2016  Component Date Value Ref Range Status  . Sodium 02/26/2016 139  136 - 145 mEq/L Final  . Potassium 02/26/2016 3.9   3.5 - 5.1 mEq/L Final  . Chloride 02/26/2016 105  98 - 109 mEq/L Final  . CO2 02/26/2016 22  22 - 29 mEq/L Final  . Glucose 02/26/2016 110  70 - 140 mg/dl Final  . BUN 02/26/2016 9.1  7.0 - 26.0 mg/dL Final  . Creatinine 02/26/2016 0.7  0.6 - 1.1 mg/dL Final  . Total Bilirubin 02/26/2016 0.35  0.20 - 1.20 mg/dL Final  . Alkaline Phosphatase 02/26/2016 131  40 - 150 U/L Final  . AST 02/26/2016 17  5 - 34 U/L Final  . ALT 02/26/2016 14  0 - 55 U/L Final  . Total Protein 02/26/2016 7.4  6.4 - 8.3 g/dL Final  .  Albumin 02/26/2016 3.7  3.5 - 5.0 g/dL Final  . Calcium 02/26/2016 9.5  8.4 - 10.4 mg/dL Final  . Anion Gap 02/26/2016 12* 3 - 11 mEq/L Final  . EGFR 02/26/2016 >90  >90 ml/min/1.73 m2 Final  . WBC 02/26/2016 7.0  3.9 - 10.3 10e3/uL Final  . NEUT# 02/26/2016 6.1  1.5 - 6.5 10e3/uL Final  . HGB 02/26/2016 11.8  11.6 - 15.9 g/dL Final  . HCT 02/26/2016 34.9  34.8 - 46.6 % Final  . Platelets 02/26/2016 218  145 - 400 10e3/uL Final  . MCV 02/26/2016 94.1  79.5 - 101.0 fL Final  . MCH 02/26/2016 31.8  25.1 - 34.0 pg Final  . MCHC 02/26/2016 33.8  31.5 - 36.0 g/dL Final  . RBC 02/26/2016 3.71  3.70 - 5.45 10e6/uL Final  . RDW 02/26/2016 13.9  11.2 - 14.5 % Final  . lymph# 02/26/2016 0.7* 0.9 - 3.3 10e3/uL Final  . MONO# 02/26/2016 0.1  0.1 - 0.9 10e3/uL Final  . Eosinophils Absolute 02/26/2016 0.0  0.0 - 0.5 10e3/uL Final  . Basophils Absolute 02/26/2016 0.0  0.0 - 0.1 10e3/uL Final  . NEUT% 02/26/2016 87.7* 38.4 - 76.8 % Final  . LYMPH% 02/26/2016 10.3* 14.0 - 49.7 % Final  . MONO% 02/26/2016 1.9  0.0 - 14.0 % Final  . EOS% 02/26/2016 0.0  0.0 - 7.0 % Final  . BASO% 02/26/2016 0.1  0.0 - 2.0 % Final    RADIOGRAPHIC STUDIES: No results found.  ASSESSMENT/PLAN:    Hand foot syndrome Patient states that all hand/foot syndrome symptoms have essentially resolved.  On exam-patient does have some mild dry skin; but no actual cracking or erythema of hands.  Will continue to monitor  closely.  Cancer of right colon [facility]) Patient presented to the [facility] today to receive cycle 5 of the oxaliplatin infusion.  She states that she did much better with the last cycle of chemotherapy with the dosing adjusted.  She states that all of her hand/foot syndrome has essentially resolved; and she has no new neuropathy complaints.  She also denies any other new symptoms whatsoever.  It scheduled to return on 03/18/2016 for labs, visit, and her next cycle of chemotherapy.   Patient stated understanding of all instructions; and was in agreement with this plan of care. The patient knows to call the clinic with any problems, questions or concerns.   Total time spent with patient was 15 minutes;  with greater than 75 percent of that time spent in face to face counseling regarding patient's symptoms,  and coordination of care and follow up.  Disclaimer:This dictation was prepared with Dragon/digital dictation along with [employer]. Any transcriptional errors that result from this process are unintentional.  Drue Second, NP 03/02/2016

## 2016-03-02 NOTE — Assessment & Plan Note (Signed)
Patient states that all hand/foot syndrome symptoms have essentially resolved.  On exam-patient does have some mild dry skin; but no actual cracking or erythema of hands.  Will continue to monitor closely.

## 2016-03-02 NOTE — Assessment & Plan Note (Signed)
Patient presented to the [facility] today to receive cycle 5 of the oxaliplatin infusion.  She states that she did much better with the last cycle of chemotherapy with the dosing adjusted.  She states that all of her hand/foot syndrome has essentially resolved; and she has no new neuropathy complaints.  She also denies any other new symptoms whatsoever.  It scheduled to return on 03/18/2016 for labs, visit, and her next cycle of chemotherapy.

## 2016-03-11 ENCOUNTER — Other Ambulatory Visit: Payer: Self-pay | Admitting: Hematology

## 2016-03-11 DIAGNOSIS — C182 Malignant neoplasm of ascending colon: Secondary | ICD-10-CM

## 2016-03-14 ENCOUNTER — Telehealth: Payer: Self-pay | Admitting: *Deleted

## 2016-03-14 NOTE — Telephone Encounter (Signed)
Received e-refill request from pt's pharmacy for Oxycodone, Ultram, Xanax.  Pt also requested refill of Flexeril.  Dr. Burr Medico notified.   Spoke with pt again, and was informed that pt can wait to pick up scripts at her next office visit on 03/18/16.    Pt stated she takes Flexeril for her Fibromyalgia.   Stated she has not seen her PCP since pt has been coming to our clinic.   Informed pt that Dr. Burr Medico will be notified.  Pt can discuss further with provider at [place] on 03/18/16.   Pt voiced understanding.

## 2016-03-18 ENCOUNTER — Ambulatory Visit (HOSPITAL_BASED_OUTPATIENT_CLINIC_OR_DEPARTMENT_OTHER): Payer: Managed Care, Other (non HMO)

## 2016-03-18 ENCOUNTER — Other Ambulatory Visit (HOSPITAL_BASED_OUTPATIENT_CLINIC_OR_DEPARTMENT_OTHER): Payer: Managed Care, Other (non HMO)

## 2016-03-18 ENCOUNTER — Ambulatory Visit (HOSPITAL_BASED_OUTPATIENT_CLINIC_OR_DEPARTMENT_OTHER): Payer: Managed Care, Other (non HMO) | Admitting: Hematology

## 2016-03-18 ENCOUNTER — Other Ambulatory Visit: Payer: Self-pay | Admitting: *Deleted

## 2016-03-18 ENCOUNTER — Telehealth: Payer: Self-pay | Admitting: Hematology

## 2016-03-18 ENCOUNTER — Other Ambulatory Visit: Payer: Managed Care, Other (non HMO)

## 2016-03-18 ENCOUNTER — Encounter: Payer: Self-pay | Admitting: *Deleted

## 2016-03-18 VITALS — BP 107/71 | HR 82 | Temp 97.7°F | Resp 18 | Ht 61.0 in | Wt 132.6 lb

## 2016-03-18 DIAGNOSIS — Z5111 Encounter for antineoplastic chemotherapy: Secondary | ICD-10-CM | POA: Diagnosis not present

## 2016-03-18 DIAGNOSIS — D509 Iron deficiency anemia, unspecified: Secondary | ICD-10-CM

## 2016-03-18 DIAGNOSIS — C180 Malignant neoplasm of cecum: Secondary | ICD-10-CM

## 2016-03-18 DIAGNOSIS — M791 Myalgia: Secondary | ICD-10-CM | POA: Diagnosis not present

## 2016-03-18 DIAGNOSIS — L271 Localized skin eruption due to drugs and medicaments taken internally: Secondary | ICD-10-CM

## 2016-03-18 DIAGNOSIS — C182 Malignant neoplasm of ascending colon: Secondary | ICD-10-CM

## 2016-03-18 DIAGNOSIS — F329 Major depressive disorder, single episode, unspecified: Secondary | ICD-10-CM

## 2016-03-18 LAB — IRON AND TIBC
%SAT: 14 % — AB (ref 21–57)
IRON: 61 ug/dL (ref 41–142)
TIBC: 441 ug/dL (ref 236–444)
UIBC: 381 ug/dL (ref 120–384)

## 2016-03-18 LAB — COMPREHENSIVE METABOLIC PANEL
ALT: 12 U/L (ref 0–55)
ANION GAP: 11 meq/L (ref 3–11)
AST: 17 U/L (ref 5–34)
Albumin: 3.9 g/dL (ref 3.5–5.0)
Alkaline Phosphatase: 134 U/L (ref 40–150)
BUN: 8.1 mg/dL (ref 7.0–26.0)
CHLORIDE: 103 meq/L (ref 98–109)
CO2: 25 meq/L (ref 22–29)
Calcium: 10 mg/dL (ref 8.4–10.4)
Creatinine: 0.7 mg/dL (ref 0.6–1.1)
Glucose: 99 mg/dl (ref 70–140)
Potassium: 4.4 mEq/L (ref 3.5–5.1)
Sodium: 139 mEq/L (ref 136–145)
TOTAL PROTEIN: 7.8 g/dL (ref 6.4–8.3)

## 2016-03-18 LAB — CBC WITH DIFFERENTIAL/PLATELET
BASO%: 0.2 % (ref 0.0–2.0)
Basophils Absolute: 0 10*3/uL (ref 0.0–0.1)
EOS%: 0 % (ref 0.0–7.0)
Eosinophils Absolute: 0 10*3/uL (ref 0.0–0.5)
HEMATOCRIT: 35.6 % (ref 34.8–46.6)
HEMOGLOBIN: 11.8 g/dL (ref 11.6–15.9)
LYMPH#: 0.8 10*3/uL — AB (ref 0.9–3.3)
LYMPH%: 12.7 % — ABNORMAL LOW (ref 14.0–49.7)
MCH: 31.8 pg (ref 25.1–34.0)
MCHC: 33.1 g/dL (ref 31.5–36.0)
MCV: 96.2 fL (ref 79.5–101.0)
MONO#: 0.2 10*3/uL (ref 0.1–0.9)
MONO%: 2.6 % (ref 0.0–14.0)
NEUT#: 5.3 10*3/uL (ref 1.5–6.5)
NEUT%: 84.5 % — ABNORMAL HIGH (ref 38.4–76.8)
Platelets: 256 10*3/uL (ref 145–400)
RBC: 3.7 10*6/uL (ref 3.70–5.45)
RDW: 15.5 % — AB (ref 11.2–14.5)
WBC: 6.3 10*3/uL (ref 3.9–10.3)

## 2016-03-18 LAB — CEA (IN HOUSE-CHCC): CEA (CHCC-IN HOUSE): 6.84 ng/mL — AB (ref 0.00–5.00)

## 2016-03-18 LAB — FERRITIN: Ferritin: 113 ng/ml (ref 9–269)

## 2016-03-18 MED ORDER — PALONOSETRON HCL INJECTION 0.25 MG/5ML
0.2500 mg | Freq: Once | INTRAVENOUS | Status: AC
  Administered 2016-03-18: 0.25 mg via INTRAVENOUS

## 2016-03-18 MED ORDER — DEXTROSE 5 % IV SOLN
Freq: Once | INTRAVENOUS | Status: AC
Start: 2016-03-18 — End: 2016-03-18
  Administered 2016-03-18: 15:00:00 via INTRAVENOUS

## 2016-03-18 MED ORDER — CYCLOBENZAPRINE HCL 10 MG PO TABS: 10.0000 mg | ORAL_TABLET | Freq: Three times a day (TID) | ORAL | 0 refills | Status: AC | PRN

## 2016-03-18 MED ORDER — OXYCODONE HCL 5 MG PO TABS: 5.0000 mg | ORAL_TABLET | ORAL | 0 refills | Status: DC | PRN

## 2016-03-18 MED ORDER — TRAMADOL HCL 50 MG PO TABS: 50.0000 mg | ORAL_TABLET | Freq: Four times a day (QID) | ORAL | 0 refills | Status: DC | PRN

## 2016-03-18 MED ORDER — OXALIPLATIN CHEMO INJECTION 100 MG/20ML
62.0000 mg/m2 | Freq: Once | INTRAVENOUS | Status: AC
  Administered 2016-03-18: 100 mg via INTRAVENOUS
  Filled 2016-03-18: qty 20

## 2016-03-18 MED ORDER — ALPRAZOLAM 0.25 MG PO TABS: 0.2500 mg | ORAL_TABLET | Freq: Every evening | ORAL | 0 refills | Status: DC | PRN

## 2016-03-18 MED ORDER — PALONOSETRON HCL INJECTION 0.25 MG/5ML
INTRAVENOUS | Status: AC
  Filled 2016-03-18: qty 5

## 2016-03-18 MED ORDER — SODIUM CHLORIDE 0.9 % IV SOLN
10.0000 mg | Freq: Once | INTRAVENOUS | Status: AC
  Administered 2016-03-18: 10 mg via INTRAVENOUS
  Filled 2016-03-18: qty 1

## 2016-03-18 NOTE — Progress Notes (Signed)
Oncology Nurse Navigator Documentation  Oncology Nurse Navigator Flowsheets 03/18/2016  Navigator Location [facility]  Navigator Encounter Type Treatment  Telephone -  Abnormal Finding Date -  Confirmed Diagnosis Date -  Surgery Date -  Treatment Initiated Date -  Patient Visit Type MedOnc  Treatment Phase Final Chemo TX  Barriers/Navigation Needs No barriers at this time;No Questions;No Needs  Education -  Interventions -  Coordination of Care -  Education Method -  Support Groups/Services FYNN--provided support group calendar and pointed out for her to call to register for [facility] Encouraged her by reminding her how strong she is and how she has overcome so much adversity. She confirms that this entire experience has made her and her husband closer and more appreciative of life and what is important. She is excited to ring the bell today and her husband will be present.   Acuity -  Time Spent with Patient 15

## 2016-03-18 NOTE — Telephone Encounter (Signed)
NO SCAN ORDERS PRESENT AT THIS TIME. ONCE ORDRED, CENTRAL RADIOLOGY SCHEDULERS WILL SCHD. AVS REPORT AND APPT SCHD GIVEN TO PATIENT PER 03/18/16 LOS.

## 2016-03-18 NOTE — Patient Instructions (Signed)
[facility] Discharge Instructions for Patients Receiving Chemotherapy  Today you received the following chemotherapy agents Oxaliplatin  To help prevent nausea and vomiting after your treatment, we encourage you to take your nausea medication as directed. No Zofran for 3 days. Take Compazine instead.    If you develop nausea and vomiting that is not controlled by your nausea medication, call the clinic.   BELOW ARE SYMPTOMS THAT SHOULD BE REPORTED IMMEDIATELY:  *FEVER GREATER THAN 100.5 F  *CHILLS WITH OR WITHOUT FEVER  NAUSEA AND VOMITING THAT IS NOT CONTROLLED WITH YOUR NAUSEA MEDICATION  *UNUSUAL SHORTNESS OF BREATH  *UNUSUAL BRUISING OR BLEEDING  TENDERNESS IN MOUTH AND THROAT WITH OR WITHOUT PRESENCE OF ULCERS  *URINARY PROBLEMS  *BOWEL PROBLEMS  UNUSUAL RASH Items with * indicate a potential emergency and should be followed up as soon as possible.  Feel free to call the clinic you have any questions or concerns. The clinic phone number is [phone].  Please show the [place] at check-in to the Emergency Department and triage nurse.

## 2016-03-18 NOTE — Progress Notes (Signed)
[name]  Telephone:([phone]) [phone] Fax:(336) [phone]  Clinic follow up Note   Patient Care Team: Carol Ada, MD as PCP - General (Family Medicine) Rolm Bookbinder, MD as Consulting Physician (General Surgery) 03/18/2016   CHIEF COMPLAINTS:  Follow up stage III colon cancer   Oncology History   Cancer of right colon [facility])   Staging form: Colon and Rectum, AJCC 7th Edition     Pathologic stage from 08/03/2015: Stage IIIB (T4a, N1a, cM0) - Signed by Truitt Merle, MD on 09/04/2015       Cancer of right colon ([place])   03/31/2015 Imaging    CT abdomen showed appendicitis, perforated, with periappendiceal abscess. And small pulmonary nodules.      04/03/2015 Procedure    CT guided placement of going Through into the right lower abdominal quadrant with aspiration of a total of 30 mL prudent fluid.       08/03/2015 Initial Diagnosis    Cancer of right colon ([place])      08/03/2015 Surgery    Right hemicolectomy      08/03/2015 Pathology Results    Right hemicolectomy showed invasive adenocarcinoma, moderately differentiated, likely arising from a cecal valve. One out of 40 lymph nodes were positive, (+) LVI, margins negative       08/03/2015 Miscellaneous    colon cance MSI-stable        HISTORY OF PRESENTING ILLNESS:  [name] 50 y.o. female without significant PMH is here because of her recently diagnosed stage III colon cancer. She is accompanied by her boyfriend to the clinic today.  She developed fever and headache in September 2016, was found to have ruptured appendix and was admitted to hospital. CT scan revealed a peri-appendiceal abscess. She underwent abscess drainage tube basement by interventional radiology on 04/17/2015, and a course of antibiotics. She had a multiple drainage tube exchange by IR, but the abscess did not heal. She was referred to Dr. Donne [name]. Multiple CT scan in October and November showed persistent peri-appendiceal fluid  collection. She was finally taken to the operation room on 08/03/2015. Intraoperatively, she was found to have a 2 cm whole present in the cecum, she underwent ileocectomy with an anastomosis in the hepatic fixture. 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  She had wound infection issue after surgery. It has been healing slowly, she is still on wet-to-dry dressing change twice daily, has home care nurse. She has moderate pain, 6-7/10, sometime postional, she is on oxycodone 2-3 times a day. Her appetite and eating is getting better, lost about 25lb since 03/2015, no fever and chills for the past 3-4 days, just finished abx yesterday.   CURRENT THERAPY: adjuvant chemotherapy CAPEOX, capecitabine 1559m bid on Day1-14, oxaliplatin 1359mm2 on D1, every 21 days, started on 09/18/2015, plan for 8 cycles, oxaliplatin was held from cycle 2 to 4 due to poor tolerance, restarted from cycle 5 with lower dose    INTERIM HISTORY:  RoHolleeturns for follow-up and last cycle chemotherapy. She has been tolerating dose reduced chemotherapy well lately, her hands and palm syndrome has resolved, she had mild diarrhea after chemotherapy, no significant nausea or other complaints. Her appetite and energy level has remained to be well, she has been able to tolerating full-time job without much difficulty.  MEDICAL HISTORY:  Past Medical History:  Diagnosis Date  . Anemia   . Anxiety   . Depression   . Fibromyalgia   . GERD (gastroesophageal reflux disease)   .  JP drain bleeding     SURGICAL HISTORY: Past Surgical History:  Procedure Laterality Date  . ABDOMINAL HYSTERECTOMY    . COLON SURGERY  08/03/2015  . COLOSTOMY REVISION N/A 08/03/2015   Procedure: COLON RESECTION RIGHT;  Surgeon: Rolm Bookbinder, MD;  Location: [place];  Service: General;  Laterality: N/A;  . GASTRIC BYPASS    . LAPAROSCOPIC ILEOCECECTOMY Right 08/03/2015   Procedure: LAPAROSCOPIC DIAGNOSTIC RIGHT COLECTOMY;  Surgeon:  Rolm Bookbinder, MD;  Location: [place];  Service: General;  Laterality: Right;    SOCIAL HISTORY: Social History   Social History  . Marital status: Married    Spouse name: N/A  . Number of children: N/A  . Years of education: N/A   Occupational History  . Not on file.   Social History Main Topics  . Smoking status: Never Smoker  . Smokeless tobacco: Never Used  . Alcohol use 1.2 oz/week    2 Glasses of wine per week     Comment: moderate 1-2 times a month   . Drug use: No  . Sexual activity: Not on file   Other Topics Concern  . Not on file   Social History Narrative   Single, fiance [name]   Works [employer] 11:30am to 8 pm    FAMILY HISTORY: Family History  Problem Relation Age of Onset  . Cancer Father 32    small cell lung cancer and colon cancer   . Cancer Sister 50    small cell lung cancer  . Cancer Maternal Aunt     ovarian cancer  . Cancer Paternal Uncle     brain cancer   . Cancer Other 40    ovarian cancer (maternal niece)    ALLERGIES:  is allergic to latex; cymbalta [duloxetine hcl]; and penicillins.  MEDICATIONS:  Current Outpatient Prescriptions  Medication Sig Dispense Refill  . acetaminophen (TYLENOL) 500 MG tablet Take 1,000 mg by mouth every 6 (six) hours as needed for fever. Reported on 09/24/2015    . ALPRAZolam (XANAX) 0.25 MG tablet Take 1-2 tablets (0.25-0.5 mg total) by mouth at bedtime as needed for anxiety or sleep. 40 tablet 0  . capecitabine (XELODA) 500 MG tablet Take 3 tab in the morning and 2 tab in evening. Take on days 1-14 of chemotherapy, then 7 days off 70 tablet 2  . cyclobenzaprine (FLEXERIL) 10 MG tablet Take 1 tablet (10 mg total) by mouth 3 (three) times daily as needed for muscle spasms. 30 tablet 0  . dexamethasone (DECADRON) 4 MG tablet Take 2 tablets (8 mg total) by mouth daily. Start day after chemo and take for 3 days after each IV chemo 30 tablet 1  . diphenhydrAMINE (BENADRYL) 25 mg capsule Take  25 mg by mouth every 6 (six) hours as needed for sleep.    Marland Kitchen escitalopram (LEXAPRO) 10 MG tablet Take 10 mg by mouth daily.    . ferrous sulfate 325 (65 FE) MG tablet Take 325 mg by mouth 2 (two) times daily with a meal. Reported on 10/12/2015    . ondansetron (ZOFRAN) 8 MG tablet Take 1 tablet (8 mg total) by mouth as directed. Take twice daily as needed for nausea-start on day 3 of chemotherapy 30 tablet 1  . oxyCODONE (OXY IR/ROXICODONE) 5 MG immediate release tablet Take 1 tablet (5 mg total) by mouth every 4 (four) hours as needed for severe pain. 40 tablet 0  . potassium chloride SA (K-DUR,KLOR-CON) 20 MEQ tablet Take 1 tab  PO TID x 1 day; then take 1 tab PO BID till gone. 14 tablet 1  . prochlorperazine (COMPAZINE) 10 MG tablet Take 1 tablet (10 mg total) by mouth every 8 (eight) hours as needed for nausea or vomiting. 30 tablet 1  . traMADol (ULTRAM) 50 MG tablet Take 1-2 tablets (50-100 mg total) by mouth every 6 (six) hours as needed. 60 tablet 0  . urea (CARMOL) 10 % cream Apply topically as needed. 71 g 1  . vitamin B-12 (CYANOCOBALAMIN) 1000 MCG tablet Take 500 mcg by mouth daily. Reported on 09/24/2015    . vitamin C (ASCORBIC ACID) 500 MG tablet Take 500 mg by mouth daily.     No current facility-administered medications for this visit.    Facility-Administered Medications Ordered in Other Visits  Medication Dose Route Frequency Provider Last Rate Last Dose  . clindamycin (CLEOCIN) 900 mg in dextrose 5 % 50 mL IVPB  900 mg Intravenous 60 min Pre-Op Rolm Bookbinder, MD       And  . gentamicin (GARAMYCIN) 310 mg in dextrose 5 % 50 mL IVPB  5 mg/kg Intravenous 60 min Pre-Op Rolm Bookbinder, MD        REVIEW OF SYSTEMS:   Constitutional: Denies fevers, chills or abnormal night sweats Eyes: Denies blurriness of vision, double vision or watery eyes Ears, nose, mouth, throat, and face: Denies mucositis or sore throat Respiratory: Denies cough, dyspnea or wheezes Cardiovascular:  Denies palpitation, chest discomfort or lower extremity swelling Gastrointestinal:  Denies nausea, heartburn or change in bowel habits Skin: Denies abnormal skin rashes Lymphatics: Denies new lymphadenopathy or easy bruising Neurological:Denies numbness, tingling or new weaknesses Behavioral/Psych: Mood is stable, no new changes  All other systems were reviewed with the patient and are negative.  PHYSICAL EXAMINATION: ECOG PERFORMANCE STATUS: 1 - Symptomatic but completely ambulatory  Vitals:   03/18/16 1309  BP: 107/71  Pulse: 82  Resp: 18  Temp: 97.7 F (36.5 C)   Filed Weights   03/18/16 1309  Weight: 132 lb 9.6 oz (60.1 kg)    GENERAL:alert, no distress and comfortable SKIN: skin color, texture, turgor are normal, no rashes or significant lesions EYES: normal, conjunctiva are pink and non-injected, sclera clear OROPHARYNX:no exudate, no erythema and lips, buccal mucosa, and tongue normal  NECK: supple, thyroid normal size, non-tender, without nodularity LYMPH:  no palpable lymphadenopathy in the cervical, axillary or inguinal   LUNGS: clear to auscultation and percussion with normal breathing effort HEART: regular rate & rhythm and no murmurs and no lower extremity edema ABDOMEN:abdomen soft, non-tender and normal bowel sounds,  Her midline surgical wound has completely healed, and appears to be dry without any discharge.  Musculoskeletal:no cyanosis of digits and no clubbing  PSYCH: alert & oriented x 3 with fluent speech NEURO: no focal motor/sensory deficits  LABORATORY DATA:  I have reviewed the data as listed CBC Latest Ref Rng & Units 03/18/2016 02/26/2016 02/04/2016  WBC 3.9 - 10.3 10e3/uL 6.3 7.0 4.2  Hemoglobin 11.6 - 15.9 g/dL 11.8 11.8 11.8  Hematocrit 34.8 - 46.6 % 35.6 34.9 34.6(L)  Platelets 145 - 400 10e3/uL 256 218 194   CMP Latest Ref Rng & Units 03/18/2016 02/26/2016 02/04/2016  Glucose 70 - 140 mg/dl 99 110 95  BUN 7.0 - 26.0 mg/dL 8.1 9.1 5.9(L)    Creatinine 0.6 - 1.1 mg/dL 0.7 0.7 0.7  Sodium 136 - 145 mEq/L 139 139 141  Potassium 3.5 - 5.1 mEq/L 4.4 3.9 4.1  Chloride 101 -  111 mmol/L - - -  CO2 22 - 29 mEq/L 25 22 25   Calcium 8.4 - 10.4 mg/dL 10.0 9.5 9.2  Total Protein 6.4 - 8.3 g/dL 7.8 7.4 6.8  Total Bilirubin 0.20 - 1.20 mg/dL <0.30 0.35 0.42  Alkaline Phos 40 - 150 U/L 134 131 125  AST 5 - 34 U/L 17 17 19   ALT 0 - 55 U/L 12 14 12    PATHOLOGY REPORT Diagnosis 08/03/2015 Colon, segmental resection, Right - INVASIVE ADENOCARCINOMA WITH ABUNDANT EXTRACELLULAR MUCIN, MODERATELY DIFFERENTIATED, LIKELY ARISING FROM ILEOCECAL VALVE. - ADENOCARCINOMA EXTENDS AT LEAST INTO THE PERICOLONIC SOFT TISSUE AND IS ASSOCIATED WITH TRANSMURAL DEFECT. - THE PROXIMAL AND DISTAL RESECTION MARGINS ARE NEGATIVE FOR ADENOCARCINOMA. - ACELLULAR MUCIN IS GROSSLY PRESENT AT THE SEROSAL SURFACE. - LYMPHOVASCULAR INVASION IS IDENTIFIED. - METASTATIC CARCINOMA IN 1 OF 40 LYMPH NODES (1/40). - SEE ONCOLOGY TABLE BELOW. Microscopic Comment COLON AND RECTUM (INCLUDING TRANS-ANAL RESECTION): Specimen: Right colon and terminal ileum. Procedure: Resection. Tumor site: Likely ileocecal valve. Specimen integrity: Transmural defect(s). Macroscopic intactness of mesorectum: N/A Macroscopic tumor perforation: Present. Invasive tumor: Maximum size: At least 2.5 cm. Histologic type(s): Adenocarcinoma with abundant extracellular mucin. Histologic grade and differentiation: G2: moderately differentiated. Type of polyp in which invasive carcinoma arose: Tubular adenoma. Microscopic extension of invasive tumor: Adenocarcinoma extends into pericolonic soft tissue and acellular mucin is grossly present at the serosal surface. Lymph-Vascular invasion: Present. Peri-neural invasion: Not identified. Tumor deposit(s) (discontinuous extramural extension): Not identified. Resection margins: Proximal margin: Negative for carcinoma. Distal margin: Negative for  carcinoma. Circumferential (radial) (posterior ascending, posterior descending; lateral and posterior mid-rectum; and entire lower 1/3 rectum): Acellular mucin is present at the circumferential tissue edge. Treatment effect (neo-adjuvant therapy): N/A Additional polyp(s): Not identified. Non-neoplastic findings: No significant findings. Lymph nodes: number examined 40; number positive: 1 Pathologic Staging: at least pT4a, pN1a. Ancillary studies: MMR by IHC and MSI by PCR will be performed and the results reported separately. Additional studies can be performed upon clinician request.   ADDITIONAL INFORMATION: Mismatch Repair (MMR) Protein Immunohistochemistry (IHC) IHC Expression Result: MLH1: EQUIVOCAL MSH2: EQUIVOCAL MSH6: EQUIVOCAL PMS2: EQUIVOCAL * Internal control demonstrates intact nuclear expression Interpretation: EQUIVOCAL The tumor shows partial staining with all anibodies and hence the results are considered equivocal. Correlated with molecular based MSI testing is strongly recommended.    RADIOGRAPHIC STUDIES: I have personally reviewed the radiological images as listed and agreed with the findings in the report. No results found.  ASSESSMENT & PLAN:  50 year old Caucasian female, with past medical history of fibromyalgia, depression, presented with ruptured cecum and pericolonic abscess, required multiple drainage, antibiotics, and eventually right colectomy.  1. Right colon cancer, cecum, moderately differentiated adenocarcinoma, pT4N1aM0, stage IIIA, with perforation, MSI-stable  - I reviewed her scan findings, and surgical pathology results in great details with patient and her boyfriend. -Her CT chest was negative for metastasis. - she has locally advanced stage III  Disease, with particular high risk features of T4 tumor, proliferation for 3-4 months,  And positive lymph nodes, she is at very high risk for cancer recurrence, especially peritoneal  carcinomatosis. - I strongly recommend her to consider adjuvant chemotherapy, FOLFOX or CAPEOX. She opted CAPEOX,with oxaliplatin 130 mg/m, on day one, and capecitabien 1000 mg/m twice daily, on day 1-14, every 21 day cycle, a total of 8 cycles  -She received the first dose of oxaliplatin and 1 day of Xeloda, tolerated poorly. Oxaliplatin has been held since then, and restarted from cycle 5 with 50% dose  reduction. She also had significant hand and foot syndrome, and Xeloda dose was reduced from 3012m/day to 25078mday  -We tried slightly higher dose of oxaliplatin, she did not tolerate well, will decrease back to 6511m2 -She'll continue Xeloda 2500 mg/day, possible increased to 3000 mg per day for the last 5-7 days of each cycle -Due to her multiple dose reduction and delay, I recommended her get one more cycle (cycle 9) CAPOX, she agrees  -She is doing well, lab reviewed, adequate for treatment, we'll proceed with last cycle chemotherapy today -We discussed colon cancer surveillance after she completes adjuvant chemotherapy. I'll see her every 3-4 months for the first 3 years, then every 6 months for total 5 years. We'll repeat a CT scan in a few months, then every 6-12 months.  2. Abdominal wound, history of abdominal abscess -The wound has completely healed now  3. Anemia, iron deficient anemia  - likely secondary to surgery,  Chronic infection and GI bleeding from the tumor -Iron study today showed low serum iron, transferrin saturation and ferritin 12, which is consistent with iron deficiency -She received IV Feraheme twice, responded well. Anemia near resolved now -I encouraged her to continue iron pill over-the-counter 1 tablet twice daily.   5. Fibromyalgia and depression - she will continue medication, and follow-up with her primary care physician -She takes oxycodone and Xanax 1 tablet a day and night, I reviewed for her -I refilled her xanas, tramadol, and oxycodone today. She  has been using oxycodone for chemotherapy related leg pain, I will wait her off oxycodone since she is finishing chemotherapy. I refilled only #30 today    Plan -I refilled her oxycodone (30 pills), Xanax and Xeloda, no more oxycodone refill in the future  -last cycle CAPOX today, Xeloda 2500m42my (may increase to 3000mg3m for the last 5-7 days) for 2 weeks on and one week off, and oxaliplatin 65 mg/m today (50% dose reduction) -I will see her back in 6-8 weeks with a surveillance CT scan.  All questions were answered. The patient knows to call the clinic with any problems, questions or concerns. I spent 25 minutes counseling the patient face to face. The total time spent in the appointment was 30 minutes and more than 50% was on counseling.     Jordyne Poehlman,Truitt Merle8/25/2017

## 2016-03-19 ENCOUNTER — Encounter: Payer: Self-pay | Admitting: Hematology

## 2016-03-19 LAB — CEA: CEA1: 8.8 ng/mL — AB (ref 0.0–4.7)

## 2016-04-19 ENCOUNTER — Other Ambulatory Visit: Payer: Self-pay | Admitting: Hematology

## 2016-04-19 DIAGNOSIS — C182 Malignant neoplasm of ascending colon: Secondary | ICD-10-CM

## 2016-04-20 ENCOUNTER — Other Ambulatory Visit: Payer: Self-pay | Admitting: *Deleted

## 2016-04-20 DIAGNOSIS — C182 Malignant neoplasm of ascending colon: Secondary | ICD-10-CM

## 2016-04-20 MED ORDER — TRAMADOL HCL 50 MG PO TABS: 50.0000 mg | ORAL_TABLET | Freq: Four times a day (QID) | ORAL | 0 refills | Status: DC | PRN

## 2016-04-29 ENCOUNTER — Ambulatory Visit (HOSPITAL_COMMUNITY)
Admission: RE | Admit: 2016-04-29 | Discharge: 2016-04-29 | Disposition: A | Discharge status: Home / Self Care | Payer: Managed Care, Other (non HMO) | Source: Ambulatory Visit | Attending: Hematology | Admitting: Hematology

## 2016-04-29 ENCOUNTER — Other Ambulatory Visit (HOSPITAL_BASED_OUTPATIENT_CLINIC_OR_DEPARTMENT_OTHER): Payer: Managed Care, Other (non HMO)

## 2016-04-29 DIAGNOSIS — C182 Malignant neoplasm of ascending colon: Secondary | ICD-10-CM

## 2016-04-29 DIAGNOSIS — C786 Secondary malignant neoplasm of retroperitoneum and peritoneum: Secondary | ICD-10-CM | POA: Diagnosis not present

## 2016-04-29 DIAGNOSIS — R918 Other nonspecific abnormal finding of lung field: Secondary | ICD-10-CM | POA: Insufficient documentation

## 2016-04-29 DIAGNOSIS — R590 Localized enlarged lymph nodes: Secondary | ICD-10-CM | POA: Diagnosis not present

## 2016-04-29 LAB — CBC WITH DIFFERENTIAL/PLATELET
BASO%: 1.1 % (ref 0.0–2.0)
Basophils Absolute: 0.1 10*3/uL (ref 0.0–0.1)
EOS ABS: 0.1 10*3/uL (ref 0.0–0.5)
EOS%: 1.1 % (ref 0.0–7.0)
HEMATOCRIT: 35.5 % (ref 34.8–46.6)
HGB: 11.6 g/dL (ref 11.6–15.9)
LYMPH%: 30.9 % (ref 14.0–49.7)
MCH: 29.3 pg (ref 25.1–34.0)
MCHC: 32.6 g/dL (ref 31.5–36.0)
MCV: 89.8 fL (ref 79.5–101.0)
MONO#: 0.5 10*3/uL (ref 0.1–0.9)
MONO%: 7.5 % (ref 0.0–14.0)
NEUT#: 3.9 10*3/uL (ref 1.5–6.5)
NEUT%: 59.4 % (ref 38.4–76.8)
PLATELETS: 267 10*3/uL (ref 145–400)
RBC: 3.95 10*6/uL (ref 3.70–5.45)
RDW: 14 % (ref 11.2–14.5)
WBC: 6.6 10*3/uL (ref 3.9–10.3)
lymph#: 2 10*3/uL (ref 0.9–3.3)

## 2016-04-29 LAB — COMPREHENSIVE METABOLIC PANEL
ALBUMIN: 3.3 g/dL — AB (ref 3.5–5.0)
ALK PHOS: 145 U/L (ref 40–150)
ALT: 14 U/L (ref 0–55)
ANION GAP: 9 meq/L (ref 3–11)
AST: 18 U/L (ref 5–34)
BUN: 6.3 mg/dL — ABNORMAL LOW (ref 7.0–26.0)
CALCIUM: 9.2 mg/dL (ref 8.4–10.4)
CHLORIDE: 107 meq/L (ref 98–109)
CO2: 24 mEq/L (ref 22–29)
Creatinine: 0.6 mg/dL (ref 0.6–1.1)
EGFR: >90 mL/min/{1.73_m2} (ref >90)
Glucose: 84 mg/dl (ref 70–140)
POTASSIUM: 3.9 meq/L (ref 3.5–5.1)
Sodium: 140 mEq/L (ref 136–145)
Total Bilirubin: <0.3 mg/dL (ref 0.20–1.20)
Total Protein: 6.9 g/dL (ref 6.4–8.3)

## 2016-04-29 MED ORDER — IOPAMIDOL (ISOVUE-300) INJECTION 61%
100.0000 mL | Freq: Once | INTRAVENOUS | Status: AC | PRN
  Administered 2016-04-29: 100 mL via INTRAVENOUS

## 2016-05-06 ENCOUNTER — Telehealth: Payer: Self-pay | Admitting: Hematology

## 2016-05-06 ENCOUNTER — Other Ambulatory Visit: Payer: Managed Care, Other (non HMO)

## 2016-05-06 ENCOUNTER — Ambulatory Visit (HOSPITAL_BASED_OUTPATIENT_CLINIC_OR_DEPARTMENT_OTHER): Payer: Managed Care, Other (non HMO) | Admitting: Hematology

## 2016-05-06 VITALS — BP 118/81 | HR 101 | Temp 98.5°F | Resp 17 | Ht 61.0 in | Wt 134.3 lb

## 2016-05-06 DIAGNOSIS — F329 Major depressive disorder, single episode, unspecified: Secondary | ICD-10-CM

## 2016-05-06 DIAGNOSIS — M791 Myalgia: Secondary | ICD-10-CM

## 2016-05-06 DIAGNOSIS — C180 Malignant neoplasm of cecum: Secondary | ICD-10-CM

## 2016-05-06 DIAGNOSIS — D509 Iron deficiency anemia, unspecified: Secondary | ICD-10-CM | POA: Diagnosis not present

## 2016-05-06 DIAGNOSIS — R109 Unspecified abdominal pain: Secondary | ICD-10-CM

## 2016-05-06 DIAGNOSIS — C182 Malignant neoplasm of ascending colon: Secondary | ICD-10-CM

## 2016-05-06 MED ORDER — OXYCODONE HCL 5 MG PO TABS: 5.0000 mg | ORAL_TABLET | ORAL | 0 refills | Status: DC | PRN

## 2016-05-06 MED ORDER — ALPRAZOLAM 0.25 MG PO TABS: 0.2500 mg | ORAL_TABLET | Freq: Every evening | ORAL | 0 refills | Status: DC | PRN

## 2016-05-06 NOTE — Progress Notes (Signed)
[place]  [phone] [phone]  Clinic follow up Note   Patient Care Team: Carol Ada, MD as PCP - General (Family Medicine) Rolm Bookbinder, MD as Consulting Physician (General Surgery) 05/06/2016   CHIEF COMPLAINTS:  Follow up stage III colon cancer   Oncology History   Cancer of right colon [facility])   Staging form: Colon and Rectum, AJCC 7th Edition     Pathologic stage from 08/03/2015: Stage IIIB (T4a, N1a, cM0) - Signed by Truitt Merle, MD on 09/04/2015       Cancer of right colon ([place])   03/31/2015 Imaging    CT abdomen showed appendicitis, perforated, with periappendiceal abscess. And small pulmonary nodules.      04/03/2015 Procedure    CT guided placement of going Through into the right lower abdominal quadrant with aspiration of a total of 30 mL prudent fluid.       08/03/2015 Initial Diagnosis    Cancer of right colon ([place])      08/03/2015 Surgery    Right hemicolectomy      08/03/2015 Pathology Results    Right hemicolectomy showed invasive adenocarcinoma, moderately differentiated, likely arising from a cecal valve. One out of 40 lymph nodes were positive, (+) LVI, margins negative       08/03/2015 Miscellaneous    colon cance MSI-stable        HISTORY OF PRESENTING ILLNESS:  [name] 50 y.o. female without significant PMH is here because of her recently diagnosed stage III colon cancer. She is accompanied by her boyfriend to the clinic today.  She developed fever and headache in September 2016, was found to have ruptured appendix and was admitted to hospital. CT scan revealed a peri-appendiceal abscess. She underwent abscess drainage tube basement by interventional radiology on 04/17/2015, and a course of antibiotics. She had a multiple drainage tube exchange by IR, but the abscess did not heal. She was referred to Dr. Donne [name]. Multiple CT scan in October and November showed persistent peri-appendiceal fluid  collection. She was finally taken to the operation room on 08/03/2015. Intraoperatively, she was found to have a 2 cm whole present in the cecum, she underwent ileocectomy with an anastomosis in the hepatic fixture. 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  She had wound infection issue after surgery. It has been healing slowly, she is still on wet-to-dry dressing change twice daily, has home care nurse. She has moderate pain, 6-7/10, sometime postional, she is on oxycodone 2-3 times a day. Her appetite and eating is getting better, lost about 25lb since 03/2015, no fever and chills for the past 3-4 days, just finished abx yesterday.   CURRENT THERAPY: observation   INTERIM HISTORY:  [name] returns for follow-up and discuss recent CT scan. She is accompanied by her husband to the clinic today. She is doing moderately well, her appetite and energy level has much improved since she came off chemotherapy. She is able to work full-time. However she still has moderate lower abdominal pain, she takes oxycodone 3 times a day. It is tolerable. She denies any significant nausea, change of her bowel habits, or bloating. Her weight is stable.  MEDICAL HISTORY:  Past Medical History:  Diagnosis Date  . Anemia   . Anxiety   . Depression   . Fibromyalgia   . GERD (gastroesophageal reflux disease)   . JP drain bleeding     SURGICAL HISTORY: Past Surgical History:  Procedure Laterality Date  . ABDOMINAL HYSTERECTOMY    .  COLON SURGERY  08/03/2015  . COLOSTOMY REVISION N/A 08/03/2015   Procedure: COLON RESECTION RIGHT;  Surgeon: Rolm Bookbinder, MD;  Location: [place];  Service: General;  Laterality: N/A;  . GASTRIC BYPASS    . LAPAROSCOPIC ILEOCECECTOMY Right 08/03/2015   Procedure: LAPAROSCOPIC DIAGNOSTIC RIGHT COLECTOMY;  Surgeon: Rolm Bookbinder, MD;  Location: [place];  Service: General;  Laterality: Right;    SOCIAL HISTORY: Social History   Social History  . Marital status:  Married    Spouse name: N/A  . Number of children: N/A  . Years of education: N/A   Occupational History  . Not on file.   Social History Main Topics  . Smoking status: Never Smoker  . Smokeless tobacco: Never Used  . Alcohol use 1.2 oz/week    2 Glasses of wine per week     Comment: moderate 1-2 times a month   . Drug use: No  . Sexual activity: Not on file   Other Topics Concern  . Not on file   Social History Narrative   Single, fiance [name]   Works [employer] 11:30am to 8 pm    FAMILY HISTORY: Family History  Problem Relation Age of Onset  . Cancer Father 8    small cell lung cancer and colon cancer   . Cancer Sister 50    small cell lung cancer  . Cancer Maternal Aunt     ovarian cancer  . Cancer Paternal Uncle     brain cancer   . Cancer Other 40    ovarian cancer (maternal niece)    ALLERGIES:  is allergic to latex; cymbalta [duloxetine hcl]; and penicillins.  MEDICATIONS:  Current Outpatient Prescriptions  Medication Sig Dispense Refill  . acetaminophen (TYLENOL) 500 MG tablet Take 1,000 mg by mouth every 6 (six) hours as needed for fever. Reported on 09/24/2015    . ALPRAZolam (XANAX) 0.25 MG tablet Take 1-2 tablets (0.25-0.5 mg total) by mouth at bedtime as needed for anxiety or sleep. 40 tablet 0  . capecitabine (XELODA) 500 MG tablet Take 3 tab in the morning and 2 tab in evening. Take on days 1-14 of chemotherapy, then 7 days off (Patient not taking: Reported on 03/18/2016) 70 tablet 2  . cyclobenzaprine (FLEXERIL) 10 MG tablet Take 1 tablet (10 mg total) by mouth 3 (three) times daily as needed for muscle spasms. 60 tablet 0  . dexamethasone (DECADRON) 4 MG tablet Take 2 tablets (8 mg total) by mouth daily. Start day after chemo and take for 3 days after each IV chemo 30 tablet 1  . diphenhydrAMINE (BENADRYL) 25 mg capsule Take 25 mg by mouth every 6 (six) hours as needed for sleep.    Marland Kitchen escitalopram (LEXAPRO) 10 MG tablet Take 10 mg by  mouth daily.    . ferrous sulfate 325 (65 FE) MG tablet Take 325 mg by mouth 2 (two) times daily with a meal. Reported on 10/12/2015    . ondansetron (ZOFRAN) 8 MG tablet Take 1 tablet (8 mg total) by mouth as directed. Take twice daily as needed for nausea-start on day 3 of chemotherapy 30 tablet 1  . oxyCODONE (OXY IR/ROXICODONE) 5 MG immediate release tablet Take 1 tablet (5 mg total) by mouth every 4 (four) hours as needed for severe pain. 30 tablet 0  . potassium chloride SA (K-DUR,KLOR-CON) 20 MEQ tablet Take 1 tab PO TID x 1 day; then take 1 tab PO BID till gone. 14 tablet 1  .  prochlorperazine (COMPAZINE) 10 MG tablet Take 1 tablet (10 mg total) by mouth every 8 (eight) hours as needed for nausea or vomiting. 30 tablet 1  . traMADol (ULTRAM) 50 MG tablet Take 1-2 tablets (50-100 mg total) by mouth every 6 (six) hours as needed. 60 tablet 0  . urea (CARMOL) 10 % cream Apply topically as needed. 71 g 1  . vitamin B-12 (CYANOCOBALAMIN) 1000 MCG tablet Take 500 mcg by mouth daily. Reported on 09/24/2015    . vitamin C (ASCORBIC ACID) 500 MG tablet Take 500 mg by mouth daily.     No current facility-administered medications for this visit.    Facility-Administered Medications Ordered in Other Visits  Medication Dose Route Frequency Provider Last Rate Last Dose  . clindamycin (CLEOCIN) 900 mg in dextrose 5 % 50 mL IVPB  900 mg Intravenous 60 min Pre-Op Rolm Bookbinder, MD       And  . gentamicin (GARAMYCIN) 310 mg in dextrose 5 % 50 mL IVPB  5 mg/kg Intravenous 60 min Pre-Op Rolm Bookbinder, MD        REVIEW OF SYSTEMS:   Constitutional: Denies fevers, chills or abnormal night sweats Eyes: Denies blurriness of vision, double vision or watery eyes Ears, nose, mouth, throat, and face: Denies mucositis or sore throat Respiratory: Denies cough, dyspnea or wheezes Cardiovascular: Denies palpitation, chest discomfort or lower extremity swelling Gastrointestinal:  Denies nausea, heartburn or  change in bowel habits Skin: Denies abnormal skin rashes Lymphatics: Denies new lymphadenopathy or easy bruising Neurological:Denies numbness, tingling or new weaknesses Behavioral/Psych: Mood is stable, no new changes  All other systems were reviewed with the patient and are negative.  PHYSICAL EXAMINATION: ECOG PERFORMANCE STATUS: 1 - Symptomatic but completely ambulatory  Vitals:   05/06/16 1529  BP: 118/81  Pulse: (!) 101  Resp: 17  Temp: 98.5 F (36.9 C)   Filed Weights   05/06/16 1529  Weight: 134 lb 4.8 oz (60.9 kg)    GENERAL:alert, no distress and comfortable SKIN: skin color, texture, turgor are normal, no rashes or significant lesions EYES: normal, conjunctiva are pink and non-injected, sclera clear OROPHARYNX:no exudate, no erythema and lips, buccal mucosa, and tongue normal  NECK: supple, thyroid normal size, non-tender, without nodularity LYMPH:  no palpable lymphadenopathy in the cervical, axillary or inguinal   LUNGS: clear to auscultation and percussion with normal breathing effort HEART: regular rate & rhythm and no murmurs and no lower extremity edema ABDOMEN:abdomen soft, non-tender and normal bowel sounds,  Her midline surgical wound has completely healed, and appears to be dry without any discharge.  Musculoskeletal:no cyanosis of digits and no clubbing  PSYCH: alert & oriented x 3 with fluent speech NEURO: no focal motor/sensory deficits  LABORATORY DATA:  I have reviewed the data as listed CBC Latest Ref Rng & Units 04/29/2016 03/18/2016 02/26/2016  WBC 3.9 - 10.3 10e3/uL 6.6 6.3 7.0  Hemoglobin 11.6 - 15.9 g/dL 11.6 11.8 11.8  Hematocrit 34.8 - 46.6 % 35.5 35.6 34.9  Platelets 145 - 400 10e3/uL 267 256 218   CMP Latest Ref Rng & Units 04/29/2016 03/18/2016 02/26/2016  Glucose 70 - 140 mg/dl 84 99 110  BUN 7.0 - 26.0 mg/dL 6.3(L) 8.1 9.1  Creatinine 0.6 - 1.1 mg/dL 0.6 0.7 0.7  Sodium 136 - 145 mEq/L 140 139 139  Potassium 3.5 - 5.1 mEq/L 3.9 4.4 3.9    Chloride 101 - 111 mmol/L - - -  CO2 22 - 29 mEq/L 24 25 22   Calcium  8.4 - 10.4 mg/dL 9.2 10.0 9.5  Total Protein 6.4 - 8.3 g/dL 6.9 7.8 7.4  Total Bilirubin 0.20 - 1.20 mg/dL <0.30 <0.30 0.35  Alkaline Phos 40 - 150 U/L 145 134 131  AST 5 - 34 U/L 18 17 17   ALT 0 - 55 U/L 14 12 14    PATHOLOGY REPORT Diagnosis 08/03/2015 Colon, segmental resection, Right - INVASIVE ADENOCARCINOMA WITH ABUNDANT EXTRACELLULAR MUCIN, MODERATELY DIFFERENTIATED, LIKELY ARISING FROM ILEOCECAL VALVE. - ADENOCARCINOMA EXTENDS AT LEAST INTO THE PERICOLONIC SOFT TISSUE AND IS ASSOCIATED WITH TRANSMURAL DEFECT. - THE PROXIMAL AND DISTAL RESECTION MARGINS ARE NEGATIVE FOR ADENOCARCINOMA. - ACELLULAR MUCIN IS GROSSLY PRESENT AT THE SEROSAL SURFACE. - LYMPHOVASCULAR INVASION IS IDENTIFIED. - METASTATIC CARCINOMA IN 1 OF 40 LYMPH NODES (1/40). - SEE ONCOLOGY TABLE BELOW. Microscopic Comment COLON AND RECTUM (INCLUDING TRANS-ANAL RESECTION): Specimen: Right colon and terminal ileum. Procedure: Resection. Tumor site: Likely ileocecal valve. Specimen integrity: Transmural defect(s). Macroscopic intactness of mesorectum: N/A Macroscopic tumor perforation: Present. Invasive tumor: Maximum size: At least 2.5 cm. Histologic type(s): Adenocarcinoma with abundant extracellular mucin. Histologic grade and differentiation: G2: moderately differentiated. Type of polyp in which invasive carcinoma arose: Tubular adenoma. Microscopic extension of invasive tumor: Adenocarcinoma extends into pericolonic soft tissue and acellular mucin is grossly present at the serosal surface. Lymph-Vascular invasion: Present. Peri-neural invasion: Not identified. Tumor deposit(s) (discontinuous extramural extension): Not identified. Resection margins: Proximal margin: Negative for carcinoma. Distal margin: Negative for carcinoma. Circumferential (radial) (posterior ascending, posterior descending; lateral and posterior mid-rectum; and  entire lower 1/3 rectum): Acellular mucin is present at the circumferential tissue edge. Treatment effect (neo-adjuvant therapy): N/A Additional polyp(s): Not identified. Non-neoplastic findings: No significant findings. Lymph nodes: number examined 40; number positive: 1 Pathologic Staging: at least pT4a, pN1a. Ancillary studies: MMR by IHC and MSI by PCR will be performed and the results reported separately. Additional studies can be performed upon clinician request.   ADDITIONAL INFORMATION: Mismatch Repair (MMR) Protein Immunohistochemistry (IHC) IHC Expression Result: MLH1: EQUIVOCAL MSH2: EQUIVOCAL MSH6: EQUIVOCAL PMS2: EQUIVOCAL * Internal control demonstrates intact nuclear expression Interpretation: EQUIVOCAL The tumor shows partial staining with all anibodies and hence the results are considered equivocal. Correlated with molecular based MSI testing is strongly recommended.    RADIOGRAPHIC STUDIES: I have personally reviewed the radiological images as listed and agreed with the findings in the report. Ct Chest W Contrast  Result Date: 04/29/2016 CLINICAL DATA:  50 y/o  F; stage III colon cancer for follow-up. EXAM: CT CHEST, ABDOMEN AND PELVIS WITHOUT CONTRAST TECHNIQUE: Multidetector CT imaging of the chest, abdomen and pelvis was performed following the standard protocol without IV contrast. COMPARISON:  09/03/2015 CT chest.  08/10/2015 CT abdomen pelvis per FINDINGS: CT CHEST FINDINGS Cardiovascular: Normal heart size. No pericardial effusion. Moderate coronary artery calcification. Mediastinum/Nodes: No enlarged mediastinal, hilar, or axillary lymph nodes. Thyroid gland, trachea, and esophagus demonstrate no significant findings. Lungs/Pleura: Previously identified nodules in the lower lobes bilaterally are stable, series 4, image 75 and 93. Additional pulmonary nodules on the axial sequence was not appreciable on the prior axial likely due to thick section. They are  present on the thin coronal reconstruction. Middle lobe subpleural, 3 mm, series 4, image 70. Left upper lobe the lateral lingula subpleural, 3 mm, series 4, image 66. Left upper lobe, 3 mm, 4:60. Nodules appear stable in size. Musculoskeletal: No chest wall mass or suspicious bone lesions identified. CT ABDOMEN PELVIS FINDINGS Hepatobiliary: There are multiple low-attenuation scalloping masses along the surface of the liver  with additional masses surrounding the dome of gallbladder along the falciform ligament, in the right pericolic gutter at the margins of ascitic fluid within the pelvis, and within the moment omentum anteriorly. Findings are consistent with peritoneal carcinomatosis not appreciable on the prior study. Addition, there is nodularity along the tract of the right JP drain which may represent neoplasm (series 2, image 85). No definite liver parenchymal masses identified. Normal gallbladder. No intra or extrahepatic biliary ductal dilatation. Pancreas: Unremarkable. No pancreatic ductal dilatation or surrounding inflammatory changes. Spleen: Scalloping the mass along the posterior margin of the spleen (series 2, image 50) no definite renal parenchymal lesion. Adrenals/Urinary Tract: Adrenal glands are unremarkable. Kidneys are normal, without renal calculi, focal lesion, or hydronephrosis. Bladder is unremarkable. Stomach/Bowel: Postsurgical changes related to gastric bypass and hemicolectomy. No obstructive or inflammatory changes of the bowel identified. Vascular/Lymphatic: Normal aorta and major branches. Mesenteric lymphadenopathy. Reproductive: Status posthysterectomy appear Other: Lower abdominal small ventral hernia containing fat and broad-based mid abdominal ventral eventration and diastases of rectus muscles consistent with postsurgical changes. Musculoskeletal: No acute or significant osseous findings. IMPRESSION: 1. 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 2. Stable pulmonary nodules. Careful attention at follow-up recommended. 3. No definite evidence for new extraperitoneal metastasis. Electronically Signed   By: Kristine Garbe M.D.   On: 04/29/2016 20:27   Ct Abdomen Pelvis W Contrast  Result Date: 04/29/2016 CLINICAL DATA:  50 y/o  F; stage III colon cancer for follow-up. EXAM: CT CHEST, ABDOMEN AND PELVIS WITHOUT CONTRAST TECHNIQUE: Multidetector CT imaging of the chest, abdomen and pelvis was performed following the standard protocol without IV contrast. COMPARISON:  09/03/2015 CT chest.  08/10/2015 CT abdomen pelvis per FINDINGS: CT CHEST FINDINGS Cardiovascular: Normal heart size. No pericardial effusion. Moderate coronary artery calcification. Mediastinum/Nodes: No enlarged mediastinal, hilar, or axillary lymph nodes. Thyroid gland, trachea, and esophagus demonstrate no significant findings. Lungs/Pleura: Previously identified nodules in the lower lobes bilaterally are stable, series 4, image 75 and 93. Additional pulmonary nodules on the axial sequence was not appreciable on the prior axial likely due to thick section. They are present on the thin coronal reconstruction. Middle lobe subpleural, 3 mm, series 4, image 70. Left upper lobe the lateral lingula subpleural, 3 mm, series 4, image 66. Left upper lobe, 3 mm, 4:60. Nodules appear stable in size. Musculoskeletal: No chest wall mass or suspicious bone lesions identified. CT ABDOMEN PELVIS FINDINGS Hepatobiliary: There are multiple low-attenuation scalloping masses along the surface of the liver with additional masses surrounding the dome of gallbladder along the falciform ligament, in the right pericolic gutter at the margins of ascitic fluid within the pelvis, and within the moment omentum anteriorly. Findings are consistent with peritoneal carcinomatosis not appreciable on the prior study. Addition, there is nodularity along the tract of the right JP  drain which may represent neoplasm (series 2, image 85). No definite liver parenchymal masses identified. Normal gallbladder. No intra or extrahepatic biliary ductal dilatation. Pancreas: Unremarkable. No pancreatic ductal dilatation or surrounding inflammatory changes. Spleen: Scalloping the mass along the posterior margin of the spleen (series 2, image 50) no definite renal parenchymal lesion. Adrenals/Urinary Tract: Adrenal glands are unremarkable. Kidneys are normal, without renal calculi, focal lesion, or hydronephrosis. Bladder is unremarkable. Stomach/Bowel: Postsurgical changes related to gastric bypass and hemicolectomy. No obstructive or inflammatory changes of the bowel identified. Vascular/Lymphatic: Normal aorta and major branches. Mesenteric lymphadenopathy. Reproductive: Status posthysterectomy appear Other: Lower abdominal small ventral hernia containing  fat and broad-based mid abdominal ventral eventration and diastases of rectus muscles consistent with postsurgical changes. Musculoskeletal: No acute or significant osseous findings. IMPRESSION: 1. Peritoneal carcinomatosis with scalloping of the liver, spleen, omental masses, and mesenteric lymphadenopathy. 2. Stable pulmonary nodules. Careful attention at follow-up recommended. 3. No definite evidence for new extraperitoneal metastasis. Electronically Signed   By: Kristine Garbe M.D.   On: 04/29/2016 20:27    ASSESSMENT & PLAN:  50 year old Caucasian female, with past medical history of fibromyalgia, depression, presented with ruptured cecum and pericolonic abscess, required multiple drainage, antibiotics, and eventually right colectomy.  1. Right colon cancer, cecum, moderately differentiated adenocarcinoma, pT4N1aM0, stage IIIA, with perforation, MSI-stable  - I reviewed her scan findings, and surgical pathology results in great details with patient and her Husband.  - she has locally advanced stage III  Disease, with particular  high risk features of T4 tumor, proliferation for 3-4 months,  And positive lymph nodes, she is at very high risk for cancer recurrence, especially peritoneal carcinomatosis. -She has completed adjuvant chemotherapy CAPOX, oxaliplatin dose was significantly reduced due to her poor tolerance. 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 No other significant metastasis on the CT scan. We discussed that the metastasis is not definitive based on CT, and needs to be confirmed by biopsy  - I recommend her to have a PET scan for further evaluation the extents of metastatic disease, and peritoneal mass biopsy to confirm metastasis -I will send her biopsy sample to [facility] to see if she is a candidate for EGFR inhibitor and other targeted therapy  -We discussed treatment option if her biopsy confirms metastasis from colon cancer, including chemotherapy, biological agent, and surgical debulking and HIPEC for curative intent. She is young, probably would be a candidate for HIPEC, although she has quite extensive disease at liver capsule. I will present her case in our GI tumor board after her PET scan  -Both patient and her husband were quite distressed after we discussed the scan results, but she is agreeable with further workup and treatment  2. Abdominal wound, history of abdominal abscess -The wound has completely healed now  3. Anemia, iron deficient anemia  - likely secondary to surgery,  Chronic infection and GI bleeding from the tumor -Iron study today showed low serum iron, transferrin saturation and ferritin 12, which is consistent with iron deficiency -She received IV Feraheme twice, responded well. Anemia near resolved now -I encouraged her to continue iron pill over-the-counter 1 tablet twice daily.   5. Fibromyalgia and depression - she will continue medication, and follow-up with her primary care physician  6.  Abdominal pain -Likely related to her cancer recurrence -She has been use oxycodone more lately due to her cancer recurrence   Plan -I refilled her oxycodone and xanax  -PET scan in 1-2 weeks -IR peritoneal mass biopsy -FO on biopsy sample  -I'll see her back in 2 weeks  All questions were answered. The patient knows to call the clinic with any problems, questions or concerns. I spent 30 minutes counseling the patient face to face. The total time spent in the appointment was 40 minutes and more than 50% was on counseling.     Truitt Merle, MD 05/06/2016

## 2016-05-06 NOTE — Telephone Encounter (Signed)
Gave patient avs report and appointments for October.  Not orders for pet and bx at this time. Central radiology will contact patient once orders entered.

## 2016-05-07 ENCOUNTER — Encounter: Payer: Self-pay | Admitting: Hematology

## 2016-05-12 ENCOUNTER — Telehealth: Payer: Self-pay | Admitting: Hematology

## 2016-05-12 NOTE — Telephone Encounter (Signed)
Faxed pt records to [facility] [phone]

## 2016-05-20 ENCOUNTER — Ambulatory Visit (HOSPITAL_COMMUNITY)
Admission: RE | Admit: 2016-05-20 | Discharge: 2016-05-20 | Disposition: A | Discharge status: Home / Self Care | Payer: Managed Care, Other (non HMO) | Source: Ambulatory Visit | Attending: Hematology | Admitting: Hematology

## 2016-05-20 DIAGNOSIS — I2510 Atherosclerotic heart disease of native coronary artery without angina pectoris: Secondary | ICD-10-CM | POA: Diagnosis not present

## 2016-05-20 DIAGNOSIS — R918 Other nonspecific abnormal finding of lung field: Secondary | ICD-10-CM | POA: Insufficient documentation

## 2016-05-20 DIAGNOSIS — C182 Malignant neoplasm of ascending colon: Secondary | ICD-10-CM | POA: Diagnosis present

## 2016-05-20 DIAGNOSIS — C786 Secondary malignant neoplasm of retroperitoneum and peritoneum: Secondary | ICD-10-CM | POA: Diagnosis not present

## 2016-05-20 DIAGNOSIS — R188 Other ascites: Secondary | ICD-10-CM | POA: Insufficient documentation

## 2016-05-20 DIAGNOSIS — I700 Atherosclerosis of aorta: Secondary | ICD-10-CM | POA: Diagnosis not present

## 2016-05-20 LAB — GLUCOSE, CAPILLARY: Glucose-Capillary: 76 mg/dL (ref 65–99)

## 2016-05-20 MED ORDER — FLUDEOXYGLUCOSE F - 18 (FDG) INJECTION
7.2100 | Freq: Once | INTRAVENOUS | Status: AC | PRN
  Administered 2016-05-20: 7.21 via INTRAVENOUS

## 2016-05-23 ENCOUNTER — Ambulatory Visit (HOSPITAL_BASED_OUTPATIENT_CLINIC_OR_DEPARTMENT_OTHER): Payer: Managed Care, Other (non HMO) | Admitting: Hematology

## 2016-05-23 ENCOUNTER — Encounter: Payer: Self-pay | Admitting: Hematology

## 2016-05-23 VITALS — BP 119/82 | HR 112 | Temp 98.6°F | Resp 18 | Ht 61.0 in | Wt 131.0 lb

## 2016-05-23 DIAGNOSIS — M791 Myalgia: Secondary | ICD-10-CM

## 2016-05-23 DIAGNOSIS — K669 Disorder of peritoneum, unspecified: Secondary | ICD-10-CM

## 2016-05-23 DIAGNOSIS — C182 Malignant neoplasm of ascending colon: Secondary | ICD-10-CM | POA: Diagnosis not present

## 2016-05-23 DIAGNOSIS — D509 Iron deficiency anemia, unspecified: Secondary | ICD-10-CM

## 2016-05-23 DIAGNOSIS — C180 Malignant neoplasm of cecum: Secondary | ICD-10-CM | POA: Diagnosis not present

## 2016-05-23 DIAGNOSIS — R911 Solitary pulmonary nodule: Secondary | ICD-10-CM

## 2016-05-23 DIAGNOSIS — R109 Unspecified abdominal pain: Secondary | ICD-10-CM

## 2016-05-23 DIAGNOSIS — K5900 Constipation, unspecified: Secondary | ICD-10-CM

## 2016-05-23 DIAGNOSIS — F329 Major depressive disorder, single episode, unspecified: Secondary | ICD-10-CM

## 2016-05-23 MED ORDER — ONDANSETRON HCL 8 MG PO TABS: 8.0000 mg | ORAL_TABLET | Freq: Two times a day (BID) | ORAL | 1 refills | Status: DC | PRN

## 2016-05-23 MED ORDER — PROCHLORPERAZINE MALEATE 10 MG PO TABS: 10.0000 mg | ORAL_TABLET | Freq: Four times a day (QID) | ORAL | 1 refills | Status: DC | PRN

## 2016-05-23 MED ORDER — LIDOCAINE-PRILOCAINE 2.5-2.5 % EX CREA: 1.0000 "application " | TOPICAL_CREAM | CUTANEOUS | 2 refills | Status: AC | PRN

## 2016-05-23 MED ORDER — OXYCODONE HCL 5 MG PO TABS: 5.0000 mg | ORAL_TABLET | ORAL | 0 refills | Status: DC | PRN

## 2016-05-23 NOTE — Progress Notes (Signed)
[place]  [phone] [phone]  Clinic follow up Note   Patient Care Team: Carol Ada, MD as PCP - General (Family Medicine) Rolm Bookbinder, MD as Consulting Physician (General Surgery) 05/23/2016   CHIEF COMPLAINTS:  Follow up stage III colon cancer   Oncology History   Cancer of right colon [facility])   Staging form: Colon and Rectum, AJCC 7th Edition     Pathologic stage from 08/03/2015: Stage IIIB (T4a, N1a, cM0) - Signed by Truitt Merle, MD on 09/04/2015       Cancer of right colon ([place])   03/31/2015 Imaging    CT abdomen showed appendicitis, perforated, with periappendiceal abscess. And small pulmonary nodules.      04/03/2015 Procedure    CT guided placement of going Through into the right lower abdominal quadrant with aspiration of a total of 30 mL prudent fluid.       08/03/2015 Initial Diagnosis    Cancer of right colon ([place])      08/03/2015 Surgery    Right hemicolectomy      08/03/2015 Pathology Results    Right hemicolectomy showed invasive adenocarcinoma, moderately differentiated, likely arising from a cecal valve. One out of 40 lymph nodes were positive, (+) LVI, margins negative       08/03/2015 Miscellaneous    colon cance MSI-stable       05/20/2016 Imaging    PET scan revealed hypermetabolic peritoneal carcinomatosis, five scatted pulmonary nodules in both lungs, largest 4 mm, metastasis not excluded. Hypermetabolic mass in the deep subcutaneous ventral right lateral pelvic wall, probable metastasis.       HISTORY OF PRESENTING ILLNESS:  [name] 50 y.o. female without significant PMH is here because of her recently diagnosed stage III colon cancer. She is accompanied by her boyfriend to the clinic today.  She developed fever and headache in September 2016, was found to have ruptured appendix and was admitted to hospital. CT scan revealed a peri-appendiceal abscess. She underwent abscess drainage tube basement  by interventional radiology on 04/17/2015, and a course of antibiotics. She had a multiple drainage tube exchange by IR, but the abscess did not heal. She was referred to Dr. Donne [name]. Multiple CT scan in October and November showed persistent peri-appendiceal fluid collection. She was finally taken to the operation room on 08/03/2015. Intraoperatively, she was found to have a 2 cm whole present in the cecum, she underwent ileocectomy with an anastomosis in the hepatic fixture. 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  She had wound infection issue after surgery. It has been healing slowly, she is still on wet-to-dry dressing change twice daily, has home care nurse. She has moderate pain, 6-7/10, sometime postional, she is on oxycodone 2-3 times a day. Her appetite and eating is getting better, lost about 25lb since 03/2015, no fever and chills for the past 3-4 days, just finished abx yesterday.   CURRENT THERAPY: observation   INTERIM HISTORY:  [name] returns for follow-up and discuss recent PET scan. She still has moderate abdominal pain, for which she takes oxycodone 2-3 times a day. She also has constipation, has been trying stool softener and laxatives. No other new complaints.  MEDICAL HISTORY:  Past Medical History:  Diagnosis Date  . Anemia   . Anxiety   . Depression   . Fibromyalgia   . GERD (gastroesophageal reflux disease)   . JP drain bleeding     SURGICAL HISTORY: Past Surgical History:  Procedure Laterality Date  .  ABDOMINAL HYSTERECTOMY    . COLON SURGERY  08/03/2015  . COLOSTOMY REVISION N/A 08/03/2015   Procedure: COLON RESECTION RIGHT;  Surgeon: Rolm Bookbinder, MD;  Location: [place];  Service: General;  Laterality: N/A;  . GASTRIC BYPASS    . LAPAROSCOPIC ILEOCECECTOMY Right 08/03/2015   Procedure: LAPAROSCOPIC DIAGNOSTIC RIGHT COLECTOMY;  Surgeon: Rolm Bookbinder, MD;  Location: [place];  Service: General;  Laterality: Right;    SOCIAL  HISTORY: Social History   Social History  . Marital status: Married    Spouse name: N/A  . Number of children: N/A  . Years of education: N/A   Occupational History  . Not on file.   Social History Main Topics  . Smoking status: Never Smoker  . Smokeless tobacco: Never Used  . Alcohol use 1.2 oz/week    2 Glasses of wine per week     Comment: moderate 1-2 times a month   . Drug use: No  . Sexual activity: Not on file   Other Topics Concern  . Not on file   Social History Narrative   Single, fiance [name]   Works [employer] 11:30am to 8 pm    FAMILY HISTORY: Family History  Problem Relation Age of Onset  . Cancer Father 72    small cell lung cancer and colon cancer   . Cancer Sister 50    small cell lung cancer  . Cancer Maternal Aunt     ovarian cancer  . Cancer Paternal Uncle     brain cancer   . Cancer Other 40    ovarian cancer (maternal niece)    ALLERGIES:  is allergic to latex; cymbalta [duloxetine hcl]; and penicillins.  MEDICATIONS:  Current Outpatient Prescriptions  Medication Sig Dispense Refill  . acetaminophen (TYLENOL) 500 MG tablet Take 1,000 mg by mouth every 6 (six) hours as needed for fever. Reported on 09/24/2015    . ALPRAZolam (XANAX) 0.25 MG tablet Take 1-2 tablets (0.25-0.5 mg total) by mouth at bedtime as needed for anxiety or sleep. 60 tablet 0  . bisacodyl (DULCOLAX) 5 MG EC tablet Take 10 mg by mouth daily as needed for moderate constipation.    . cyclobenzaprine (FLEXERIL) 10 MG tablet Take 1 tablet (10 mg total) by mouth 3 (three) times daily as needed for muscle spasms. 60 tablet 0  . docusate sodium (COLACE) 100 MG capsule Take 300 mg by mouth daily.    . ferrous sulfate 325 (65 FE) MG tablet Take 325 mg by mouth 2 (two) times daily with a meal. Reported on 10/12/2015    . oxyCODONE (OXY IR/ROXICODONE) 5 MG immediate release tablet Take 1 tablet (5 mg total) by mouth every 4 (four) hours as needed for severe pain. 60  tablet 0  . traMADol (ULTRAM) 50 MG tablet Take 1-2 tablets (50-100 mg total) by mouth every 6 (six) hours as needed. 60 tablet 0  . vitamin B-12 (CYANOCOBALAMIN) 1000 MCG tablet Take 500 mcg by mouth daily. Reported on 09/24/2015    . vitamin C (ASCORBIC ACID) 500 MG tablet Take 500 mg by mouth daily.    . capecitabine (XELODA) 500 MG tablet Take 3 tab in the morning and 2 tab in evening. Take on days 1-14 of chemotherapy, then 7 days off (Patient not taking: Reported on 05/23/2016) 70 tablet 2  . dexamethasone (DECADRON) 4 MG tablet Take 2 tablets (8 mg total) by mouth daily. Start day after chemo and take for 3 days after each IV  chemo (Patient not taking: Reported on 05/23/2016) 30 tablet 1  . diphenhydrAMINE (BENADRYL) 25 mg capsule Take 25 mg by mouth every 6 (six) hours as needed for sleep.    Marland Kitchen escitalopram (LEXAPRO) 10 MG tablet Take 10 mg by mouth daily.    . ondansetron (ZOFRAN) 8 MG tablet Take 1 tablet (8 mg total) by mouth as directed. Take twice daily as needed for nausea-start on day 3 of chemotherapy (Patient not taking: Reported on 05/23/2016) 30 tablet 1  . potassium chloride SA (K-DUR,KLOR-CON) 20 MEQ tablet Take 1 tab PO TID x 1 day; then take 1 tab PO BID till gone. (Patient not taking: Reported on 05/23/2016) 14 tablet 1  . prochlorperazine (COMPAZINE) 10 MG tablet Take 1 tablet (10 mg total) by mouth every 8 (eight) hours as needed for nausea or vomiting. (Patient not taking: Reported on 05/23/2016) 30 tablet 1  . urea (CARMOL) 10 % cream Apply topically as needed. (Patient not taking: Reported on 05/23/2016) 71 g 1   No current facility-administered medications for this visit.    Facility-Administered Medications Ordered in Other Visits  Medication Dose Route Frequency Provider Last Rate Last Dose  . clindamycin (CLEOCIN) 900 mg in dextrose 5 % 50 mL IVPB  900 mg Intravenous 60 min Pre-Op Rolm Bookbinder, MD       And  . gentamicin (GARAMYCIN) 310 mg in dextrose 5 % 50 mL  IVPB  5 mg/kg Intravenous 60 min Pre-Op Rolm Bookbinder, MD        REVIEW OF SYSTEMS:   Constitutional: Denies fevers, chills or abnormal night sweats Eyes: Denies blurriness of vision, double vision or watery eyes Ears, nose, mouth, throat, and face: Denies mucositis or sore throat Respiratory: Denies cough, dyspnea or wheezes Cardiovascular: Denies palpitation, chest discomfort or lower extremity swelling Gastrointestinal:  Denies nausea, heartburn or change in bowel habits Skin: Denies abnormal skin rashes Lymphatics: Denies new lymphadenopathy or easy bruising Neurological:Denies numbness, tingling or new weaknesses Behavioral/Psych: Mood is stable, no new changes  All other systems were reviewed with the patient and are negative.  PHYSICAL EXAMINATION: ECOG PERFORMANCE STATUS: 1 - Symptomatic but completely ambulatory  Vitals:   05/23/16 1525  BP: 119/82  Pulse: (!) 112  Resp: 18  Temp: 98.6 F (37 C)   Filed Weights   05/23/16 1525  Weight: 131 lb (59.4 kg)    GENERAL:alert, no distress and comfortable SKIN: skin color, texture, turgor are normal, no rashes or significant lesions EYES: normal, conjunctiva are pink and non-injected, sclera clear OROPHARYNX:no exudate, no erythema and lips, buccal mucosa, and tongue normal  NECK: supple, thyroid normal size, non-tender, without nodularity LYMPH:  no palpable lymphadenopathy in the cervical, axillary or inguinal   LUNGS: clear to auscultation and percussion with normal breathing effort HEART: regular rate & rhythm and no murmurs and no lower extremity edema ABDOMEN:abdomen soft, non-tender and normal bowel sounds,  Her midline surgical wound has completely healed, and appears to be dry without any discharge.  Musculoskeletal:no cyanosis of digits and no clubbing  PSYCH: alert & oriented x 3 with fluent speech NEURO: no focal motor/sensory deficits  LABORATORY DATA:  I have reviewed the data as listed CBC Latest Ref  Rng & Units 04/29/2016 03/18/2016 02/26/2016  WBC 3.9 - 10.3 10e3/uL 6.6 6.3 7.0  Hemoglobin 11.6 - 15.9 g/dL 11.6 11.8 11.8  Hematocrit 34.8 - 46.6 % 35.5 35.6 34.9  Platelets 145 - 400 10e3/uL 267 256 218   CMP Latest Ref Rng &  Units 04/29/2016 03/18/2016 02/26/2016  Glucose 70 - 140 mg/dl 84 99 110  BUN 7.0 - 26.0 mg/dL 6.3(L) 8.1 9.1  Creatinine 0.6 - 1.1 mg/dL 0.6 0.7 0.7  Sodium 136 - 145 mEq/L 140 139 139  Potassium 3.5 - 5.1 mEq/L 3.9 4.4 3.9  Chloride 101 - 111 mmol/L - - -  CO2 22 - 29 mEq/L 24 25 22   Calcium 8.4 - 10.4 mg/dL 9.2 10.0 9.5  Total Protein 6.4 - 8.3 g/dL 6.9 7.8 7.4  Total Bilirubin 0.20 - 1.20 mg/dL <0.30 <0.30 0.35  Alkaline Phos 40 - 150 U/L 145 134 131  AST 5 - 34 U/L 18 17 17   ALT 0 - 55 U/L 14 12 14    PATHOLOGY REPORT Diagnosis 08/03/2015 Colon, segmental resection, Right - INVASIVE ADENOCARCINOMA WITH ABUNDANT EXTRACELLULAR MUCIN, MODERATELY DIFFERENTIATED, LIKELY ARISING FROM ILEOCECAL VALVE. - ADENOCARCINOMA EXTENDS AT LEAST INTO THE PERICOLONIC SOFT TISSUE AND IS ASSOCIATED WITH TRANSMURAL DEFECT. - THE PROXIMAL AND DISTAL RESECTION MARGINS ARE NEGATIVE FOR ADENOCARCINOMA. - ACELLULAR MUCIN IS GROSSLY PRESENT AT THE SEROSAL SURFACE. - LYMPHOVASCULAR INVASION IS IDENTIFIED. - METASTATIC CARCINOMA IN 1 OF 40 LYMPH NODES (1/40). - SEE ONCOLOGY TABLE BELOW. Microscopic Comment COLON AND RECTUM (INCLUDING TRANS-ANAL RESECTION): Specimen: Right colon and terminal ileum. Procedure: Resection. Tumor site: Likely ileocecal valve. Specimen integrity: Transmural defect(s). Macroscopic intactness of mesorectum: N/A Macroscopic tumor perforation: Present. Invasive tumor: Maximum size: At least 2.5 cm. Histologic type(s): Adenocarcinoma with abundant extracellular mucin. Histologic grade and differentiation: G2: moderately differentiated. Type of polyp in which invasive carcinoma arose: Tubular adenoma. Microscopic extension of invasive tumor: Adenocarcinoma  extends into pericolonic soft tissue and acellular mucin is grossly present at the serosal surface. Lymph-Vascular invasion: Present. Peri-neural invasion: Not identified. Tumor deposit(s) (discontinuous extramural extension): Not identified. Resection margins: Proximal margin: Negative for carcinoma. Distal margin: Negative for carcinoma. Circumferential (radial) (posterior ascending, posterior descending; lateral and posterior mid-rectum; and entire lower 1/3 rectum): Acellular mucin is present at the circumferential tissue edge. Treatment effect (neo-adjuvant therapy): N/A Additional polyp(s): Not identified. Non-neoplastic findings: No significant findings. Lymph nodes: number examined 40; number positive: 1 Pathologic Staging: at least pT4a, pN1a. Ancillary studies: MMR by IHC and MSI by PCR will be performed and the results reported separately. Additional studies can be performed upon clinician request.   ADDITIONAL INFORMATION: Mismatch Repair (MMR) Protein Immunohistochemistry (IHC) IHC Expression Result: MLH1: EQUIVOCAL MSH2: EQUIVOCAL MSH6: EQUIVOCAL PMS2: EQUIVOCAL * Internal control demonstrates intact nuclear expression Interpretation: EQUIVOCAL The tumor shows partial staining with all anibodies and hence the results are considered equivocal. Correlated with molecular based MSI testing is strongly recommended.    RADIOGRAPHIC STUDIES: I have personally reviewed the radiological images as listed and agreed with the findings in the report. Ct Chest W Contrast  Result Date: 04/29/2016 CLINICAL DATA:  50 y/o  F; stage III colon cancer for follow-up. EXAM: CT CHEST, ABDOMEN AND PELVIS WITHOUT CONTRAST TECHNIQUE: Multidetector CT imaging of the chest, abdomen and pelvis was performed following the standard protocol without IV contrast. COMPARISON:  09/03/2015 CT chest.  08/10/2015 CT abdomen pelvis per FINDINGS: CT CHEST FINDINGS Cardiovascular: Normal heart size. No  pericardial effusion. Moderate coronary artery calcification. Mediastinum/Nodes: No enlarged mediastinal, hilar, or axillary lymph nodes. Thyroid gland, trachea, and esophagus demonstrate no significant findings. Lungs/Pleura: Previously identified nodules in the lower lobes bilaterally are stable, series 4, image 75 and 93. Additional pulmonary nodules on the axial sequence was not appreciable on the prior axial likely due to thick  section. They are present on the thin coronal reconstruction. Middle lobe subpleural, 3 mm, series 4, image 70. Left upper lobe the lateral lingula subpleural, 3 mm, series 4, image 66. Left upper lobe, 3 mm, 4:60. Nodules appear stable in size. Musculoskeletal: No chest wall mass or suspicious bone lesions identified. CT ABDOMEN PELVIS FINDINGS Hepatobiliary: There are multiple low-attenuation scalloping masses along the surface of the liver with additional masses surrounding the dome of gallbladder along the falciform ligament, in the right pericolic gutter at the margins of ascitic fluid within the pelvis, and within the moment omentum anteriorly. Findings are consistent with peritoneal carcinomatosis not appreciable on the prior study. Addition, there is nodularity along the tract of the right JP drain which may represent neoplasm (series 2, image 85). No definite liver parenchymal masses identified. Normal gallbladder. No intra or extrahepatic biliary ductal dilatation. Pancreas: Unremarkable. No pancreatic ductal dilatation or surrounding inflammatory changes. Spleen: Scalloping the mass along the posterior margin of the spleen (series 2, image 50) no definite renal parenchymal lesion. Adrenals/Urinary Tract: Adrenal glands are unremarkable. Kidneys are normal, without renal calculi, focal lesion, or hydronephrosis. Bladder is unremarkable. Stomach/Bowel: Postsurgical changes related to gastric bypass and hemicolectomy. No obstructive or inflammatory changes of the bowel  identified. Vascular/Lymphatic: Normal aorta and major branches. Mesenteric lymphadenopathy. Reproductive: Status posthysterectomy appear Other: Lower abdominal small ventral hernia containing fat and broad-based mid abdominal ventral eventration and diastases of rectus muscles consistent with postsurgical changes. Musculoskeletal: No acute or significant osseous findings. IMPRESSION: 1. Peritoneal carcinomatosis with scalloping of the liver, spleen, omental masses, and mesenteric lymphadenopathy. 2. Stable pulmonary nodules. Careful attention at follow-up recommended. 3. No definite evidence for new extraperitoneal metastasis. Electronically Signed   By: Kristine Garbe M.D.   On: 04/29/2016 20:27   Ct Abdomen Pelvis W Contrast  Result Date: 04/29/2016 CLINICAL DATA:  50 y/o  F; stage III colon cancer for follow-up. EXAM: CT CHEST, ABDOMEN AND PELVIS WITHOUT CONTRAST TECHNIQUE: Multidetector CT imaging of the chest, abdomen and pelvis was performed following the standard protocol without IV contrast. COMPARISON:  09/03/2015 CT chest.  08/10/2015 CT abdomen pelvis per FINDINGS: CT CHEST FINDINGS Cardiovascular: Normal heart size. No pericardial effusion. Moderate coronary artery calcification. Mediastinum/Nodes: No enlarged mediastinal, hilar, or axillary lymph nodes. Thyroid gland, trachea, and esophagus demonstrate no significant findings. Lungs/Pleura: Previously identified nodules in the lower lobes bilaterally are stable, series 4, image 75 and 93. Additional pulmonary nodules on the axial sequence was not appreciable on the prior axial likely due to thick section. They are present on the thin coronal reconstruction. Middle lobe subpleural, 3 mm, series 4, image 70. Left upper lobe the lateral lingula subpleural, 3 mm, series 4, image 66. Left upper lobe, 3 mm, 4:60. Nodules appear stable in size. Musculoskeletal: No chest wall mass or suspicious bone lesions identified. CT ABDOMEN PELVIS FINDINGS  Hepatobiliary: There are multiple low-attenuation scalloping masses along the surface of the liver with additional masses surrounding the dome of gallbladder along the falciform ligament, in the right pericolic gutter at the margins of ascitic fluid within the pelvis, and within the moment omentum anteriorly. Findings are consistent with peritoneal carcinomatosis not appreciable on the prior study. Addition, there is nodularity along the tract of the right JP drain which may represent neoplasm (series 2, image 85). No definite liver parenchymal masses identified. Normal gallbladder. No intra or extrahepatic biliary ductal dilatation. Pancreas: Unremarkable. No pancreatic ductal dilatation or surrounding inflammatory changes. Spleen: Scalloping the mass along the posterior  margin of the spleen (series 2, image 50) no definite renal parenchymal lesion. Adrenals/Urinary Tract: Adrenal glands are unremarkable. Kidneys are normal, without renal calculi, focal lesion, or hydronephrosis. Bladder is unremarkable. Stomach/Bowel: Postsurgical changes related to gastric bypass and hemicolectomy. No obstructive or inflammatory changes of the bowel identified. Vascular/Lymphatic: Normal aorta and major branches. Mesenteric lymphadenopathy. Reproductive: Status posthysterectomy appear Other: Lower abdominal small ventral hernia containing fat and broad-based mid abdominal ventral eventration and diastases of rectus muscles consistent with postsurgical changes. Musculoskeletal: No acute or significant osseous findings. IMPRESSION: 1. Peritoneal carcinomatosis with scalloping of the liver, spleen, omental masses, and mesenteric lymphadenopathy. 2. Stable pulmonary nodules. Careful attention at follow-up recommended. 3. No definite evidence for new extraperitoneal metastasis. Electronically Signed   By: Kristine Garbe M.D.   On: 04/29/2016 20:27   Nm Pet Image Restag (ps) Skull Base To Thigh  Result Date:  05/20/2016 CLINICAL DATA:  Subsequent treatment strategy for invasive colonic adenocarcinoma arising from the cecal valve status post right hemicolectomy 08/03/2015, status post adjuvant chemotherapy, with suspected new peritoneal carcinomatosis on recent staging CT. EXAM: NUCLEAR MEDICINE PET SKULL BASE TO THIGH TECHNIQUE: 7.2 mCi F-18 FDG was injected intravenously. Full-ring PET imaging was performed from the skull base to thigh after the radiotracer. CT data was obtained and used for attenuation correction and anatomic localization. FASTING BLOOD GLUCOSE:  Value: 76 mg/dl COMPARISON:  04/29/2016 CT chest, abdomen and pelvis. FINDINGS: NECK No hypermetabolic lymph nodes in the neck. Symmetric mild hypermetabolism at the base of the tongue is favored to be reactive/physiologic. CHEST Nonenlarged 0.6 cm left axillary node with borderline mild hypermetabolism (max SUV 2.7). No additional hypermetabolic axillary nodes. No hypermetabolic mediastinal or hilar nodes. Left circumflex and right coronary atherosclerosis. Atherosclerotic nonaneurysmal thoracic aorta. No pleural effusions. Left upper lobe 4 mm solid pulmonary nodule (series 8/image 32) is new since 09/03/2015. Right upper lobe 4 mm solid pulmonary nodule (series 8/image 35) is new since 09/03/2015. There are 3 scattered peripheral right lower lobe solid pulmonary nodules, all stable since 09/03/2015. These nodules are below PET resolution and not associated with significant metabolism. No acute consolidative airspace disease. ABDOMEN/PELVIS Multifocal nearly confluent low-attenuation mildly hypermetabolic extracapsular peritoneal masses scalloping the entire liver contour, with representative lesions as follows: - extracapsular 5.4 x 2.6 cm mass at the anterior right liver lobe with max SUV 4.9 (series 4/image 90) - extracapsular 2.3 x 2.2 cm mass anterior to the gallbladder with max SUV 3.1 (series 4/image 100) Multiple hypermetabolic omental masses, with  a representative conglomerate 2.4 x 1.7 cm mass with max SUV 4.2 (series 4/image 135). Multiple mildly hypermetabolic mesenteric masses, with a representative 2.1 x 1.8 cm left mesenteric mass with max SUV 3.2 (series 4/image 118). Multiple mildly hypermetabolic left upper quadrant peritoneal masses, with a representative 2.0 x 1.3 cm mass superior to the splenic hilum with max SUV 3.5 (series 4/image 88). Multiple mildly hypermetabolic pelvic peritoneal masses, with a representative 2.8 x 2.2 cm left pelvic mass near the sigmoid colon with max SUV 3.8 (series 4/image 150). Mildly hypermetabolic 2.7 x 1.8 cm deep subcutaneous mass with max SUV 3.3 in the lateral ventral right pelvic wall (series 4/image 137). Small volume pelvic ascites. No abnormal hypermetabolic activity within the pancreas, adrenal glands, or spleen. No hypermetabolic lymph nodes in the abdomen or pelvis. Stable postsurgical changes from gastric bypass surgery and right hemicolectomy with ileocolic anastomosis in the right abdomen. No dilated small bowel loops. Moderate stool throughout the remnant large-bowel. Hysterectomy. SKELETON  No focal hypermetabolic activity to suggest skeletal metastasis. IMPRESSION: 1. Peritoneal carcinomatosis with hypermetabolic peritoneal masses throughout the perihepatic space, left upper quadrant, omentum, mesenteric and pelvic peritoneum. Small volume pelvic ascites. 2. Hypermetabolic mass in the deep subcutaneous ventral right lateral pelvic wall, probably a metastasis. 3. Five scattered pulmonary nodules in both lungs, largest 4 mm, below PET resolution, two of which in the upper lobes are new since 09/03/2015, cannot exclude pulmonary metastases. 4. Nonenlarged mildly hypermetabolic left axillary lymph node, nonspecific, favor benign injection related uptake (radiotracer was injected in a left hand peripheral IV). 5. Additional findings include aortic atherosclerosis and 2 vessel coronary atherosclerosis.  Electronically Signed   By: Ilona Sorrel M.D.   On: 05/20/2016 20:02    ASSESSMENT & PLAN:  50 year old Caucasian female, with past medical history of fibromyalgia, depression, presented with ruptured cecum and pericolonic abscess, required multiple drainage, antibiotics, and eventually right colectomy.  1. Right colon cancer, cecum, moderately differentiated adenocarcinoma, pT4N1aM0, stage IIIA, with perforation, MSI-stable  - I reviewed her scan findings, and surgical pathology results in great details with patient and her Husband.  - she has locally advanced stage III  Disease, with particular high risk features of T4 tumor, proliferation for 3-4 months,  And positive lymph nodes, she is at very high risk for cancer recurrence, especially peritoneal carcinomatosis. -She has completed adjuvant chemotherapy CAPOX, oxaliplatin dose was significantly reduced due to her poor tolerance. 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 No other significant metastasis on the CT scan. We discussed that the metastasis is not definitive based on CT, and needs to be confirmed by biopsy  -I reviewed her PET scan results, which showed hypermetabolic peritoneal carcinomatosis, in a few small lung nodules which are indeterminate. -I initially scheduled for her peritoneal mass biopsy by IR, but she did not schedule and wanted to wait for the PET scan -I will send her biopsy sample to [facility] to see if she is a candidate for EGFR inhibitor and other targeted therapy. After the second discussion today, she agreed to to proceed with PET new mass biopsy by interventional radiology. -I'll ask IR to put a port for chemotherapy also. She is agreeable -We discussed treatment option if her biopsy confirms metastasis from colon cancer, including chemotherapy, biological agent, and surgical debulking and HIPEC for curative intent. She is young,  probably would be a candidate for HIPEC, although she has quite extensive disease at liver capsule. I will present her case in our GI tumor board -I recommend her to consider systemic chemotherapy with 5-FU and urine taken. She previously could not tolerate oxaliplatin well. The benefit and side effects were discussed with patient, she agrees to start next week after biopsy in the port placement. -The goal of chemotherapy is palliative -Her tumor has MSI stable, she is not a candidate for immunotherapy alone outside clinical trial setting  -I will add Avastin or panitumumab, depends on her [facility] test result   2. Abdominal wound, history of abdominal abscess -The wound has completely healed now  3. Anemia, iron deficient anemia  - likely secondary to surgery,  Chronic infection and GI bleeding from the tumor -Iron study today showed low serum iron, transferrin saturation and ferritin 12, which is consistent with iron deficiency -She received IV Feraheme twice, responded well. Anemia near resolved now -I encouraged her to continue iron pill over-the-counter 1 tablet twice daily.   5. Fibromyalgia and depression - she  will continue medication, and follow-up with her primary care physician  6. Abdominal pain -Likely related to her cancer recurrence -She has been use oxycodone more lately due to her cancer recurrence   Plan -IR peritoneal mass biopsy -FO on biopsy sample  -I'll see her back next week 11/9, I'll tentatively schedule her to start chemotherapy FOLFIRI on 11/9 -Social worker referral for power of attorney -I encouraged patient in her husband to talk to our financial office regarding financial assistance   All questions were answered. The patient knows to call the clinic with any problems, questions or concerns. I spent 25 minutes counseling the patient face to face. The total time spent in the appointment was 30 minutes and more than 50% was on counseling.     Truitt Merle, MD 05/23/2016

## 2016-05-25 ENCOUNTER — Telehealth: Payer: Self-pay | Admitting: Hematology

## 2016-05-25 NOTE — Telephone Encounter (Signed)
Called patient to reschedule time of appointments before chemo. Per Sharyn Lull the patient needed to have chemo appointment scheduled by 1:30. Spoke with the husband.

## 2016-05-26 ENCOUNTER — Telehealth: Payer: Self-pay | Admitting: *Deleted

## 2016-05-26 NOTE — Telephone Encounter (Signed)
Per LOS I have scheduled appts and notified the scheduler 

## 2016-05-27 ENCOUNTER — Other Ambulatory Visit: Payer: Self-pay | Admitting: Physician Assistant

## 2016-05-30 ENCOUNTER — Encounter (HOSPITAL_COMMUNITY): Payer: Self-pay

## 2016-05-30 ENCOUNTER — Ambulatory Visit (HOSPITAL_COMMUNITY)
Admission: RE | Admit: 2016-05-30 | Discharge: 2016-05-30 | Disposition: A | Discharge status: Home / Self Care | Payer: Managed Care, Other (non HMO) | Source: Ambulatory Visit | Attending: Hematology | Admitting: Hematology

## 2016-05-30 ENCOUNTER — Other Ambulatory Visit: Payer: Self-pay | Admitting: Hematology

## 2016-05-30 DIAGNOSIS — R1904 Left lower quadrant abdominal swelling, mass and lump: Secondary | ICD-10-CM | POA: Insufficient documentation

## 2016-05-30 DIAGNOSIS — C182 Malignant neoplasm of ascending colon: Secondary | ICD-10-CM

## 2016-05-30 DIAGNOSIS — Z452 Encounter for adjustment and management of vascular access device: Secondary | ICD-10-CM | POA: Diagnosis not present

## 2016-05-30 HISTORY — PX: IR GENERIC HISTORICAL: IMG1180011

## 2016-05-30 LAB — CBC
HCT: 32.7 % — ABNORMAL LOW (ref 36.0–46.0)
Hemoglobin: 10.4 g/dL — ABNORMAL LOW (ref 12.0–15.0)
MCH: 27.1 pg (ref 26.0–34.0)
MCHC: 31.8 g/dL (ref 30.0–36.0)
MCV: 85.2 fL (ref 78.0–100.0)
PLATELETS: 377 10*3/uL (ref 150–400)
RBC: 3.84 MIL/uL — ABNORMAL LOW (ref 3.87–5.11)
RDW: 13.2 % (ref 11.5–15.5)
WBC: 5.7 10*3/uL (ref 4.0–10.5)

## 2016-05-30 LAB — PROTIME-INR
INR: 1.11
PROTHROMBIN TIME: 14.4 s (ref 11.4–15.2)

## 2016-05-30 LAB — APTT: aPTT: 33 seconds (ref 24–36)

## 2016-05-30 MED ORDER — HYDROCODONE-ACETAMINOPHEN 5-325 MG PO TABS: 1.0000 | ORAL_TABLET | ORAL | Status: DC | PRN

## 2016-05-30 MED ORDER — LIDOCAINE HCL 1 % IJ SOLN
INTRAMUSCULAR | Status: AC | PRN
  Administered 2016-05-30: 15 mL

## 2016-05-30 MED ORDER — MIDAZOLAM HCL 2 MG/2ML IJ SOLN
INTRAMUSCULAR | Status: AC
  Filled 2016-05-30: qty 6

## 2016-05-30 MED ORDER — FENTANYL CITRATE (PF) 100 MCG/2ML IJ SOLN
INTRAMUSCULAR | Status: AC | PRN
  Administered 2016-05-30 (×2): 50 ug via INTRAVENOUS

## 2016-05-30 MED ORDER — HEPARIN SOD (PORK) LOCK FLUSH 100 UNIT/ML IV SOLN
INTRAVENOUS | Status: AC
  Filled 2016-05-30: qty 5

## 2016-05-30 MED ORDER — MIDAZOLAM HCL 2 MG/2ML IJ SOLN
INTRAMUSCULAR | Status: AC | PRN
  Administered 2016-05-30 (×4): 0.5 mg via INTRAVENOUS

## 2016-05-30 MED ORDER — MIDAZOLAM HCL 2 MG/2ML IJ SOLN
INTRAMUSCULAR | Status: AC | PRN
Start: 2016-05-30 — End: 2016-05-30
  Administered 2016-05-30: 1 mg via INTRAVENOUS

## 2016-05-30 MED ORDER — SODIUM CHLORIDE 0.9 % IV SOLN
INTRAVENOUS | Status: DC
  Administered 2016-05-30: 12:00:00 via INTRAVENOUS

## 2016-05-30 MED ORDER — FENTANYL CITRATE (PF) 100 MCG/2ML IJ SOLN
INTRAMUSCULAR | Status: AC
  Filled 2016-05-30: qty 6

## 2016-05-30 MED ORDER — FENTANYL CITRATE (PF) 100 MCG/2ML IJ SOLN
INTRAMUSCULAR | Status: AC | PRN
  Administered 2016-05-30 (×4): 25 ug via INTRAVENOUS

## 2016-05-30 MED ORDER — LIDOCAINE HCL 1 % IJ SOLN
INTRAMUSCULAR | Status: AC
  Filled 2016-05-30: qty 20

## 2016-05-30 MED ORDER — VANCOMYCIN HCL IN DEXTROSE 1-5 GM/200ML-% IV SOLN
1000.0000 mg | Freq: Once | INTRAVENOUS | Status: AC
  Administered 2016-05-30: 1000 mg via INTRAVENOUS
  Filled 2016-05-30: qty 200

## 2016-05-30 NOTE — Procedures (Signed)
Interventional Radiology Procedure Note  Procedure: Placement of a right IJ approach single lumen PowerPort.  Tip is positioned at the superior cavoatrial junction and catheter is ready for immediate use.  Complications: No immediate Recommendations:  - Ok to shower tomorrow - Do not submerge for 7 days - Routine line care   Sherl Yzaguirre T. Cydney Alvarenga, M.D Pager:  [phone]   

## 2016-05-30 NOTE — H&P (Signed)
Referring Physician(s): Feng,Yan  Supervising Physician: Aletta Edouard  Patient Status:  [facility] OP  Chief Complaint:  Metastatic colon cancer  Subjective: Patient familiar to IR service from prior drainage of right abdominal abscesses in September 2016. She has a known history of stage III colon cancer, status post right hemicolectomy as well as adjuvant chemotherapy.PET scan performed on 05/20/16 revealed peritoneal carcinomatosis with hypermetabolic peritoneal masses throughout the perihepatic space, left upper quadrant, omentum, mesenteric and pelvic peritoneum. Also noted was a hypermetabolic mass in the deep subcutaneous ventral right lateral pelvic wall, 5 scattered pulmonary nodules and mildly hypermetabolic left axillary node. She presents again today for image guided peritoneal mass biopsy as well as Port-A-Cath placement. She currently denies fever, headache or chest pain, dyspnea, cough, back pain, nausea, vomiting or abnormal bleeding. She does have intermittent abdominal discomfort. Past Medical History:  Diagnosis Date  . Anemia   . Anxiety   . Depression   . Fibromyalgia   . GERD (gastroesophageal reflux disease)   . JP drain bleeding    Past Surgical History:  Procedure Laterality Date  . ABDOMINAL HYSTERECTOMY    . COLON SURGERY  08/03/2015  . COLOSTOMY REVISION N/A 08/03/2015   Procedure: COLON RESECTION RIGHT;  Surgeon: Rolm Bookbinder, MD;  Location: [place];  Service: General;  Laterality: N/A;  . GASTRIC BYPASS    . LAPAROSCOPIC ILEOCECECTOMY Right 08/03/2015   Procedure: LAPAROSCOPIC DIAGNOSTIC RIGHT COLECTOMY;  Surgeon: Rolm Bookbinder, MD;  Location: [place];  Service: General;  Laterality: Right;      Allergies: Latex; Cymbalta [duloxetine hcl]; and Penicillins  Medications: Prior to Admission medications   Medication Sig Start Date End Date Taking? Authorizing Provider  acetaminophen (TYLENOL) 500 MG tablet Take 1,000 mg by mouth every 6 (six) hours  as needed for fever. Reported on 09/24/2015    Historical Provider, MD  ALPRAZolam Duanne Moron) 0.25 MG tablet Take 1-2 tablets (0.25-0.5 mg total) by mouth at bedtime as needed for anxiety or sleep. 05/06/16   Truitt Merle, MD  bisacodyl (DULCOLAX) 5 MG EC tablet Take 10 mg by mouth daily as needed for moderate constipation.    Historical Provider, MD  capecitabine (XELODA) 500 MG tablet Take 3 tab in the morning and 2 tab in evening. Take on days 1-14 of chemotherapy, then 7 days off Patient not taking: Reported on 05/23/2016 02/05/16   Truitt Merle, MD  cyclobenzaprine (FLEXERIL) 10 MG tablet Take 1 tablet (10 mg total) by mouth 3 (three) times daily as needed for muscle spasms. 03/18/16   Truitt Merle, MD  dexamethasone (DECADRON) 4 MG tablet Take 2 tablets (8 mg total) by mouth daily. Start day after chemo and take for 3 days after each IV chemo Patient not taking: Reported on 05/23/2016 09/14/15   Truitt Merle, MD  diphenhydrAMINE (BENADRYL) 25 mg capsule Take 25 mg by mouth every 6 (six) hours as needed for sleep.    Historical Provider, MD  docusate sodium (COLACE) 100 MG capsule Take 300 mg by mouth daily.    Historical Provider, MD  escitalopram (LEXAPRO) 10 MG tablet Take 10 mg by mouth daily.    Historical Provider, MD  ferrous sulfate 325 (65 FE) MG tablet Take 325 mg by mouth 2 (two) times daily with a meal. Reported on 10/12/2015    Historical Provider, MD  lidocaine-prilocaine (EMLA) cream Apply 1 application topically as needed. 05/23/16   Truitt Merle, MD  ondansetron (ZOFRAN) 8 MG tablet Take 1 tablet (8 mg total) by  mouth as directed. Take twice daily as needed for nausea-start on day 3 of chemotherapy Patient not taking: Reported on 05/23/2016 09/14/15   Truitt Merle, MD  ondansetron (ZOFRAN) 8 MG tablet Take 1 tablet (8 mg total) by mouth 2 (two) times daily as needed for refractory nausea / vomiting. Start on day 3 after chemotherapy. 05/23/16   Truitt Merle, MD  oxyCODONE (OXY IR/ROXICODONE) 5 MG immediate release  tablet Take 1 tablet (5 mg total) by mouth every 4 (four) hours as needed for severe pain. 05/23/16   Truitt Merle, MD  potassium chloride SA (K-DUR,KLOR-CON) 20 MEQ tablet Take 1 tab PO TID x 1 day; then take 1 tab PO BID till gone. Patient not taking: Reported on 05/23/2016 09/24/15   Susanne Borders, NP  prochlorperazine (COMPAZINE) 10 MG tablet Take 1 tablet (10 mg total) by mouth every 8 (eight) hours as needed for nausea or vomiting. Patient not taking: Reported on 05/23/2016 12/18/15   Truitt Merle, MD  prochlorperazine (COMPAZINE) 10 MG tablet Take 1 tablet (10 mg total) by mouth every 6 (six) hours as needed (NAUSEA). 05/23/16   Truitt Merle, MD  traMADol (ULTRAM) 50 MG tablet Take 1-2 tablets (50-100 mg total) by mouth every 6 (six) hours as needed. 04/20/16   Truitt Merle, MD  urea (CARMOL) 10 % cream Apply topically as needed. Patient not taking: Reported on 05/23/2016 12/18/15   Truitt Merle, MD  vitamin B-12 (CYANOCOBALAMIN) 1000 MCG tablet Take 500 mcg by mouth daily. Reported on 09/24/2015    Historical Provider, MD  vitamin C (ASCORBIC ACID) 500 MG tablet Take 500 mg by mouth daily.    Historical Provider, MD     Vital Signs: BP 116/81 (BP Location: Left Arm)   Pulse (!) 101   Temp 97.9 F (36.6 C) (Oral)   Resp 18   SpO2 100%   Physical Exam awake, alert. Chest clear to auscultation bilaterally. Heart with slightly tachycardic but regular rhythm. Abdomen soft, positive bowel sounds, mildly tender mid abdomen/clean midline surgical wound; lower extremities -no edema  Imaging: No results found.  Labs:  CBC:  Recent Labs  02/04/16 1505 02/26/16 1218 03/18/16 1247 04/29/16 1202  WBC 4.2 7.0 6.3 6.6  HGB 11.8 11.8 11.8 11.6  HCT 34.6* 34.9 35.6 35.5  PLT 194 218 256 267    COAGS: No results for input(s): INR, APTT in the last 8760 hours.  BMP:  Recent Labs  07/29/15 1428 08/04/15 0346 08/06/15 0427 08/10/15 0645 08/11/15 0013  02/04/16 1505 02/26/16 1218 03/18/16 1247  04/29/16 1202  NA 140 136 134*  --  134*  < > 141 139 139 140  K 4.4 3.8 3.9  --  3.5  < > 4.1 3.9 4.4 3.9  CL 106 103 104  --  96*  --   --   --   --   --   CO2 25 25 21*  --  25  < > 25 22 25 24   GLUCOSE 86 157* 122*  --  104*  < > 95 110 99 84  BUN 7 5* <5*  --  7  < > 5.9* 9.1 8.1 6.3*  CALCIUM 9.1 8.6* 9.0  --  8.7*  < > 9.2 9.5 10.0 9.2  CREATININE 0.46 0.63 0.45 0.46 0.51  < > 0.7 0.7 0.7 0.6  GFRNONAA >60 >60 >60 >60 >60  --   --   --   --   --   GFRAA >60 >60 >  60 >60 >60  --   --   --   --   --   < > = values in this interval not displayed.  LIVER FUNCTION TESTS:  Recent Labs  02/04/16 1505 02/26/16 1218 03/18/16 1247 04/29/16 1202  BILITOT 0.42 0.35 <0.30 <0.30  AST 19 17 17 18   ALT 12 14 12 14   ALKPHOS 125 131 134 145  PROT 6.8 7.4 7.8 6.9  ALBUMIN 3.6 3.7 3.9 3.3*    Assessment and Plan: Pt with known history of stage III colon cancer diagnosed 07/2015, status post right hemicolectomy as well as adjuvant chemotherapy.PET scan performed on 05/20/16 revealed peritoneal carcinomatosis with hypermetabolic peritoneal masses throughout the perihepatic space, left upper quadrant, omentum, mesenteric and pelvic peritoneum. Also noted was a hypermetabolic mass in the deep subcutaneous ventral right lateral pelvic wall, 5 scattered pulmonary nodules and mildly hypermetabolic left axillary node. She presents again today for image guided peritoneal mass biopsy as well as Port-A-Cath placement. Details/risks of procedures, including but not limited to, internal bleeding, infection, injury to adjacent structures, venous thrombosis discussed with patient and husband with their understanding and consent. Labs pending.   Electronically Signed: D. Rowe Robert 05/30/2016, 11:43 AM   I spent a total of 25 minutes at the the patient's bedside AND on the patient's hospital floor or unit, greater than 50% of which was counseling/coordinating care for image guided peritoneal mass biopsy and  Port-A-Cath placement

## 2016-05-30 NOTE — Procedures (Signed)
Interventional Radiology Procedure Note  Procedure:  CT guided core biopsy of peritoneal mass  Complications: None  Estimated Blood Loss: < 10 mL  Left peritoneal mass biopsy under CT guidance; 18 G core biopsy x 3 via 17 G needle.    Venetia Night. Kathlene Cote, M.D Pager:  [phone]

## 2016-05-30 NOTE — Discharge Instructions (Signed)
Moderate Conscious Sedation, Adult, Care After °Refer to this sheet in the next few weeks. These instructions provide you with information on caring for yourself after your procedure. Your health care provider may also give you more specific instructions. Your treatment has been planned according to current medical practices, but problems sometimes occur. Call your health care provider if you have any problems or questions after your procedure. °WHAT TO EXPECT AFTER THE PROCEDURE  °After your procedure: °· You may feel sleepy, clumsy, and have poor balance for several hours. °· Vomiting may occur if you eat too soon after the procedure. °HOME CARE INSTRUCTIONS °· Do not participate in any activities where you could become injured for at least 24 hours. Do not: °¨ Drive. °¨ Swim. °¨ Ride a bicycle. °¨ Operate heavy machinery. °¨ Cook. °¨ Use power tools. °¨ Climb ladders. °¨ Work from a high place. °· Do not make important decisions or sign legal documents until you are improved. °· If you vomit, drink water, juice, or soup when you can drink without vomiting. Make sure you have little or no nausea before eating solid foods. °· Only take over-the-counter or prescription medicines for pain, discomfort, or fever as directed by your health care provider. °· Make sure you and your family fully understand everything about the medicines given to you, including what side effects may occur. °· You should not drink alcohol, take sleeping pills, or take medicines that cause drowsiness for at least 24 hours. °· If you smoke, do not smoke without supervision. °· If you are feeling better, you may resume normal activities 24 hours after you were sedated. °· Keep all appointments with your health care provider. °SEEK MEDICAL CARE IF: °· Your skin is pale or bluish in color. °· You continue to feel nauseous or vomit. °· Your pain is getting worse and is not helped by medicine. °· You have bleeding or swelling. °· You are still  sleepy or feeling clumsy after 24 hours. °SEEK IMMEDIATE MEDICAL CARE IF: °· You develop a rash. °· You have difficulty breathing. °· You develop any type of allergic problem. °· You have a fever. °MAKE SURE YOU: °· Understand these instructions. °· Will watch your condition. °· Will get help right away if you are not doing well or get worse. °  °This information is not intended to replace advice given to you by your health care provider. Make sure you discuss any questions you have with your health care provider. °  °Document Released: 05/01/2013 Document Revised: 08/01/2014 Document Reviewed: 05/01/2013 °Elsevier Interactive Patient Education ©2016 [employer] ° °Implanted Port Insertion, Care After °Refer to this sheet in the next few weeks. These instructions provide you with information on caring for yourself after your procedure. Your health care provider may also give you more specific instructions. Your treatment has been planned according to current medical practices, but problems sometimes occur. Call your health care provider if you have any problems or questions after your procedure. °WHAT TO EXPECT AFTER THE PROCEDURE °After your procedure, it is typical to have the following:  °· Discomfort at the port insertion site. Ice packs to the area will help. °· Bruising on the skin over the port. This will subside in 3-4 days. °HOME CARE INSTRUCTIONS °· After your port is placed, you will get a manufacturer's information card. The card has information about your port. Keep this card with you at all times.   °· Know what kind of port you have. There are many   types of ports available.   Wear a medical alert bracelet in case of an emergency. This can help alert health care workers that you have a port.   The port can stay in for as long as your health care provider believes it is necessary.   A home health care nurse may give medicines and take care of the port.   You or a family member can get  special training and directions for giving medicine and taking care of the port at home.  SEEK MEDICAL CARE IF:   Your port does not flush or you are unable to get a blood return.   You have a fever or chills. SEEK IMMEDIATE MEDICAL CARE IF:  You have new fluid or pus coming from your incision.   You notice a bad smell coming from your incision site.   You have swelling, pain, or more redness at the incision or port site.   You have chest pain or shortness of breath.   This information is not intended to replace advice given to you by your health care provider. Make sure you discuss any questions you have with your health care provider.   Document Released: 05/01/2013 Document Revised: 07/16/2013 Document Reviewed: 05/01/2013 Elsevier Interactive Patient Education 2016 Else[employer] Inc[employer]   Needle Biopsy, Care After These instructions give you information about caring for yourself after your procedure. Your doctor may also give you more specific instructions. Call your doctor if you have any problems or questions after your procedure. HOME CARE  Rest as told by your doctor.  Take medicines only as told by your doctor.  There are many different ways to close and cover the biopsy site, including stitches (sutures), skin glue, and adhesive strips. Follow instructions from your doctor about:  How to take care of your biopsy site.  When and how you should change your bandage (dressing).  When you should remove your dressing.  Removing whatever was used to close your biopsy site.  Check your biopsy site every day for signs of infection. Watch for:  Redness, swelling, or pain.  Fluid, blood, or pus. GET HELP IF:  You have a fever.  You have redness, swelling, or pain at the biopsy site, and it lasts longer than a few days.  You have fluid, blood, or pus coming from the biopsy site.  You feel sick to your stomach (nauseous).  You throw up (vomit). GET HELP RIGHT AWAY  IF:  You are short of breath.  You have trouble breathing.  Your chest hurts.  You feel dizzy or you pass out (faint).  You have bleeding that does not stop with pressure or a bandage.  You cough up blood.  Your belly (abdomen) hurts.   This information is not intended to replace advice given to you by your health care provider. Make sure you discuss any questions you have with your health care provider.   Document Released: 06/23/2008 Document Revised: 11/25/2014 Document Reviewed: 07/07/2014 Elsevier Interactive Patient Education [employer].

## 2016-06-02 ENCOUNTER — Ambulatory Visit (HOSPITAL_BASED_OUTPATIENT_CLINIC_OR_DEPARTMENT_OTHER): Payer: Managed Care, Other (non HMO)

## 2016-06-02 ENCOUNTER — Encounter: Payer: Self-pay | Admitting: *Deleted

## 2016-06-02 ENCOUNTER — Telehealth: Payer: Self-pay | Admitting: Hematology

## 2016-06-02 ENCOUNTER — Ambulatory Visit (HOSPITAL_BASED_OUTPATIENT_CLINIC_OR_DEPARTMENT_OTHER): Payer: Managed Care, Other (non HMO) | Admitting: Hematology

## 2016-06-02 ENCOUNTER — Ambulatory Visit: Payer: Managed Care, Other (non HMO)

## 2016-06-02 ENCOUNTER — Other Ambulatory Visit: Payer: Managed Care, Other (non HMO)

## 2016-06-02 ENCOUNTER — Other Ambulatory Visit (HOSPITAL_BASED_OUTPATIENT_CLINIC_OR_DEPARTMENT_OTHER): Payer: Managed Care, Other (non HMO)

## 2016-06-02 ENCOUNTER — Ambulatory Visit: Payer: Managed Care, Other (non HMO) | Admitting: Hematology

## 2016-06-02 ENCOUNTER — Telehealth: Payer: Self-pay | Admitting: *Deleted

## 2016-06-02 DIAGNOSIS — Z5111 Encounter for antineoplastic chemotherapy: Secondary | ICD-10-CM | POA: Diagnosis not present

## 2016-06-02 DIAGNOSIS — C180 Malignant neoplasm of cecum: Secondary | ICD-10-CM

## 2016-06-02 DIAGNOSIS — C779 Secondary and unspecified malignant neoplasm of lymph node, unspecified: Secondary | ICD-10-CM

## 2016-06-02 DIAGNOSIS — D509 Iron deficiency anemia, unspecified: Secondary | ICD-10-CM

## 2016-06-02 DIAGNOSIS — C182 Malignant neoplasm of ascending colon: Secondary | ICD-10-CM | POA: Diagnosis not present

## 2016-06-02 DIAGNOSIS — M791 Myalgia: Secondary | ICD-10-CM

## 2016-06-02 DIAGNOSIS — F329 Major depressive disorder, single episode, unspecified: Secondary | ICD-10-CM

## 2016-06-02 DIAGNOSIS — R109 Unspecified abdominal pain: Secondary | ICD-10-CM

## 2016-06-02 LAB — COMPREHENSIVE METABOLIC PANEL
ALT: 9 U/L (ref 0–55)
ANION GAP: 10 meq/L (ref 3–11)
AST: 15 U/L (ref 5–34)
Albumin: 3.1 g/dL — ABNORMAL LOW (ref 3.5–5.0)
Alkaline Phosphatase: 133 U/L (ref 40–150)
BILIRUBIN TOTAL: 0.25 mg/dL (ref 0.20–1.20)
BUN: 7.8 mg/dL (ref 7.0–26.0)
CHLORIDE: 102 meq/L (ref 98–109)
CO2: 26 meq/L (ref 22–29)
Calcium: 9.6 mg/dL (ref 8.4–10.4)
Creatinine: 0.6 mg/dL (ref 0.6–1.1)
Glucose: 96 mg/dl (ref 70–140)
Potassium: 4.2 mEq/L (ref 3.5–5.1)
Sodium: 137 mEq/L (ref 136–145)
Total Protein: 7.3 g/dL (ref 6.4–8.3)

## 2016-06-02 LAB — CBC WITH DIFFERENTIAL/PLATELET
BASO%: 0.5 % (ref 0.0–2.0)
BASOS ABS: 0 10*3/uL (ref 0.0–0.1)
EOS ABS: 0.1 10*3/uL (ref 0.0–0.5)
EOS%: 2.3 % (ref 0.0–7.0)
HEMATOCRIT: 32.4 % — AB (ref 34.8–46.6)
HEMOGLOBIN: 10.4 g/dL — AB (ref 11.6–15.9)
LYMPH#: 1.6 10*3/uL (ref 0.9–3.3)
LYMPH%: 25.4 % (ref 14.0–49.7)
MCH: 26.9 pg (ref 25.1–34.0)
MCHC: 32.1 g/dL (ref 31.5–36.0)
MCV: 83.7 fL (ref 79.5–101.0)
MONO#: 0.6 10*3/uL (ref 0.1–0.9)
MONO%: 9.2 % (ref 0.0–14.0)
NEUT#: 3.8 10*3/uL (ref 1.5–6.5)
NEUT%: 62.6 % (ref 38.4–76.8)
Platelets: 295 10*3/uL (ref 145–400)
RBC: 3.87 10*6/uL (ref 3.70–5.45)
RDW: 12.8 % (ref 11.2–14.5)
WBC: 6.1 10*3/uL (ref 3.9–10.3)

## 2016-06-02 LAB — IRON AND TIBC
%SAT: 5 % — AB (ref 21–57)
IRON: 15 ug/dL — AB (ref 41–142)
TIBC: 298 ug/dL (ref 236–444)
UIBC: 283 ug/dL (ref 120–384)

## 2016-06-02 LAB — CEA (IN HOUSE-CHCC): CEA (CHCC-IN HOUSE): 21.73 ng/mL — AB (ref 0.00–5.00)

## 2016-06-02 LAB — FERRITIN: Ferritin: 99 ng/ml (ref 9–269)

## 2016-06-02 MED ORDER — PALONOSETRON HCL INJECTION 0.25 MG/5ML
0.2500 mg | Freq: Once | INTRAVENOUS | Status: AC
  Administered 2016-06-02: 0.25 mg via INTRAVENOUS

## 2016-06-02 MED ORDER — SODIUM CHLORIDE 0.9 % IV SOLN
Freq: Once | INTRAVENOUS | Status: AC
  Administered 2016-06-02: 15:00:00 via INTRAVENOUS

## 2016-06-02 MED ORDER — SODIUM CHLORIDE 0.9% FLUSH
10.0000 mL | INTRAVENOUS | Status: DC | PRN
  Filled 2016-06-02: qty 10

## 2016-06-02 MED ORDER — PALONOSETRON HCL INJECTION 0.25 MG/5ML
INTRAVENOUS | Status: AC
  Filled 2016-06-02: qty 5

## 2016-06-02 MED ORDER — HEPARIN SOD (PORK) LOCK FLUSH 100 UNIT/ML IV SOLN
500.0000 [IU] | Freq: Once | INTRAVENOUS | Status: DC | PRN
  Filled 2016-06-02: qty 5

## 2016-06-02 MED ORDER — DEXAMETHASONE SODIUM PHOSPHATE 10 MG/ML IJ SOLN
10.0000 mg | Freq: Once | INTRAMUSCULAR | Status: AC
  Administered 2016-06-02: 10 mg via INTRAVENOUS

## 2016-06-02 MED ORDER — ATROPINE SULFATE 1 MG/ML IJ SOLN: 0.5000 mg | Freq: Once | INTRAMUSCULAR | Status: DC | PRN

## 2016-06-02 MED ORDER — SODIUM CHLORIDE 0.9% FLUSH
10.0000 mL | INTRAVENOUS | Status: DC | PRN
  Administered 2016-06-02: 10 mL
  Filled 2016-06-02: qty 10

## 2016-06-02 MED ORDER — LEUCOVORIN CALCIUM INJECTION 350 MG
400.0000 mg/m2 | Freq: Once | INTRAVENOUS | Status: AC
  Administered 2016-06-02: 640 mg via INTRAVENOUS
  Filled 2016-06-02: qty 32

## 2016-06-02 MED ORDER — OXYCODONE HCL 5 MG PO TABS: 5.0000 mg | ORAL_TABLET | Freq: Four times a day (QID) | ORAL | 0 refills | Status: DC | PRN

## 2016-06-02 MED ORDER — DEXAMETHASONE SODIUM PHOSPHATE 10 MG/ML IJ SOLN
INTRAMUSCULAR | Status: AC
  Filled 2016-06-02: qty 1

## 2016-06-02 MED ORDER — SODIUM CHLORIDE 0.9 % IV SOLN
2400.0000 mg/m2 | INTRAVENOUS | Status: DC
  Administered 2016-06-02: 3850 mg via INTRAVENOUS
  Filled 2016-06-02: qty 77

## 2016-06-02 MED ORDER — ALPRAZOLAM 0.25 MG PO TABS: 0.2500 mg | ORAL_TABLET | Freq: Every evening | ORAL | 0 refills | Status: DC | PRN

## 2016-06-02 MED ORDER — IRINOTECAN HCL CHEMO INJECTION 100 MG/5ML
135.0000 mg/m2 | Freq: Once | INTRAVENOUS | Status: AC
  Administered 2016-06-02: 220 mg via INTRAVENOUS
  Filled 2016-06-02: qty 5

## 2016-06-02 NOTE — Progress Notes (Signed)
Oncology Nurse Navigator Documentation  Oncology Nurse Navigator Flowsheets 06/02/2016  Navigator Location [facility]  Referral date to RadOnc/MedOnc -  Navigator Encounter Type Treatment  Telephone -  Abnormal Finding Date -  Confirmed Diagnosis Date -  Surgery Date -  Treatment Initiated Date -  Patient Visit Type MedOnc  Treatment Phase First Chemo Tx--FOLFIRI (change in tx)  Barriers/Navigation Needs Education;Family concerns  Education Pain/ Symptom Management--delayed diarrhea management with camptosar  Interventions Education;Referrals  Referrals Social Work--wants assistance with POA and living will  Coordination of Care -  Education Method Verbal;Written  Support Groups/Services -spirits are good and she is enjoying a new puppy she has.  Acuity -  Time Spent with Patient -

## 2016-06-02 NOTE — Telephone Encounter (Signed)
Per LOS I have scheduled appts and notified the scheduler 

## 2016-06-02 NOTE — Patient Instructions (Addendum)
[facility] Discharge Instructions for Patients Receiving Chemotherapy  Today you received the following chemotherapy agents Camptosar/Leucovorin/Adrucil  To help prevent nausea and vomiting after your treatment, we encourage you to take your nausea medication    If you develop nausea and vomiting that is not controlled by your nausea medication, call the clinic.   BELOW ARE SYMPTOMS THAT SHOULD BE REPORTED IMMEDIATELY:  *FEVER GREATER THAN 100.5 F  *CHILLS WITH OR WITHOUT FEVER  NAUSEA AND VOMITING THAT IS NOT CONTROLLED WITH YOUR NAUSEA MEDICATION  *UNUSUAL SHORTNESS OF BREATH  *UNUSUAL BRUISING OR BLEEDING  TENDERNESS IN MOUTH AND THROAT WITH OR WITHOUT PRESENCE OF ULCERS  *URINARY PROBLEMS  *BOWEL PROBLEMS  UNUSUAL RASH Items with * indicate a potential emergency and should be followed up as soon as possible.  Feel free to call the clinic you have any questions or concerns. The clinic phone number is [phone].  Please show the [facility] at check-in to the Emergency Department and triage nurse.  Fluorouracil, 5-FU injection What is this medicine? FLUOROURACIL, 5-FU (flure oh YOOR a sil) is a chemotherapy drug. It slows the growth of cancer cells. This medicine is used to treat many types of cancer like breast cancer, colon or rectal cancer, pancreatic cancer, and stomach cancer. This medicine may be used for other purposes; ask your health care provider or pharmacist if you have questions. What should I tell my health care provider before I take this medicine? They need to know if you have any of these conditions: -blood disorders -dihydropyrimidine dehydrogenase (DPD) deficiency -infection (especially a virus infection such as chickenpox, cold sores, or herpes) -kidney disease -liver disease -malnourished, poor nutrition -recent or ongoing radiation therapy -an unusual or allergic reaction to fluorouracil, other chemotherapy, other  medicines, foods, dyes, or preservatives -pregnant or trying to get pregnant -breast-feeding How should I use this medicine? This drug is given as an infusion or injection into a vein. It is administered in a hospital or clinic by a specially trained health care professional. Talk to your pediatrician regarding the use of this medicine in children. Special care may be needed. Overdosage: If you think you have taken too much of this medicine contact a poison control center or emergency room at once. NOTE: This medicine is only for you. Do not share this medicine with others. What if I miss a dose? It is important not to miss your dose. Call your doctor or health care professional if you are unable to keep an appointment. What may interact with this medicine? -allopurinol -cimetidine -dapsone -digoxin -hydroxyurea -leucovorin -levamisole -medicines for seizures like ethotoin, fosphenytoin, phenytoin -medicines to increase blood counts like filgrastim, pegfilgrastim, sargramostim -medicines that treat or prevent blood clots like warfarin, enoxaparin, and dalteparin -methotrexate -metronidazole -pyrimethamine -some other chemotherapy drugs like busulfan, cisplatin, estramustine, vinblastine -trimethoprim -trimetrexate -vaccines Talk to your doctor or health care professional before taking any of these medicines: -acetaminophen -aspirin -ibuprofen -ketoprofen -naproxen This list may not describe all possible interactions. Give your health care provider a list of all the medicines, herbs, non-prescription drugs, or dietary supplements you use. Also tell them if you smoke, drink alcohol, or use illegal drugs. Some items may interact with your medicine. What should I watch for while using this medicine? Visit your doctor for checks on your progress. This drug may make you feel generally unwell. This is not uncommon, as chemotherapy can affect healthy cells as well as cancer cells. Report  any side effects. Continue  your course of treatment even though you feel ill unless your doctor tells you to stop. In some cases, you may be given additional medicines to help with side effects. Follow all directions for their use. Call your doctor or health care professional for advice if you get a fever, chills or sore throat, or other symptoms of a cold or flu. Do not treat yourself. This drug decreases your body's ability to fight infections. Try to avoid being around people who are sick. This medicine may increase your risk to bruise or bleed. Call your doctor or health care professional if you notice any unusual bleeding. Be careful brushing and flossing your teeth or using a toothpick because you may get an infection or bleed more easily. If you have any dental work done, tell your dentist you are receiving this medicine. Avoid taking products that contain aspirin, acetaminophen, ibuprofen, naproxen, or ketoprofen unless instructed by your doctor. These medicines may hide a fever. Do not become pregnant while taking this medicine. Women should inform their doctor if they wish to become pregnant or think they might be pregnant. There is a potential for serious side effects to an unborn child. Talk to your health care professional or pharmacist for more information. Do not breast-feed an infant while taking this medicine. Men should inform their doctor if they wish to father a child. This medicine may lower sperm counts. Do not treat diarrhea with over the counter products. Contact your doctor if you have diarrhea that lasts more than 2 days or if it is severe and watery. This medicine can make you more sensitive to the sun. Keep out of the sun. If you cannot avoid being in the sun, wear protective clothing and use sunscreen. Do not use sun lamps or tanning beds/booths. What side effects may I notice from receiving this medicine? Side effects that you should report to your doctor or health care  professional as soon as possible: -allergic reactions like skin rash, itching or hives, swelling of the face, lips, or tongue -low blood counts - this medicine may decrease the number of white blood cells, red blood cells and platelets. You may be at increased risk for infections and bleeding. -signs of infection - fever or chills, cough, sore throat, pain or difficulty passing urine -signs of decreased platelets or bleeding - bruising, pinpoint red spots on the skin, black, tarry stools, blood in the urine -signs of decreased red blood cells - unusually weak or tired, fainting spells, lightheadedness -breathing problems -changes in vision -chest pain -mouth sores -nausea and vomiting -pain, swelling, redness at site where injected -pain, tingling, numbness in the hands or feet -redness, swelling, or sores on hands or feet -stomach pain -unusual bleeding Side effects that usually do not require medical attention (report to your doctor or health care professional if they continue or are bothersome): -changes in finger or toe nails -diarrhea -dry or itchy skin -hair loss -headache -loss of appetite -sensitivity of eyes to the light -stomach upset -unusually teary eyes This list may not describe all possible side effects. Call your doctor for medical advice about side effects. You may report side effects to FDA at 1-800-FDA-1088. Where should I keep my medicine? This drug is given in a hospital or clinic and will not be stored at home. NOTE: This sheet is a summary. It may not cover all possible information. If you have questions about this medicine, talk to your doctor, pharmacist, or health care provider.    2016,  [employer] Standard. (2007-11-14 13:53:16)  Leucovorin injection What is this medicine? LEUCOVORIN (loo koe VOR in) is used to prevent or treat the harmful effects of some medicines. This medicine is used to treat anemia caused by a low amount of folic acid in the  body. It is also used with 5-fluorouracil (5-FU) to treat colon cancer. This medicine may be used for other purposes; ask your health care provider or pharmacist if you have questions. What should I tell my health care provider before I take this medicine? They need to know if you have any of these conditions: -anemia from low levels of vitamin B-12 in the blood -an unusual or allergic reaction to leucovorin, folic acid, other medicines, foods, dyes, or preservatives -pregnant or trying to get pregnant -breast-feeding How should I use this medicine? This medicine is for injection into a muscle or into a vein. It is given by a health care professional in a hospital or clinic setting. Talk to your pediatrician regarding the use of this medicine in children. Special care may be needed. Overdosage: If you think you have taken too much of this medicine contact a poison control center or emergency room at once. NOTE: This medicine is only for you. Do not share this medicine with others. What if I miss a dose? This does not apply. What may interact with this medicine? -capecitabine -fluorouracil -phenobarbital -phenytoin -primidone -trimethoprim-sulfamethoxazole This list may not describe all possible interactions. Give your health care provider a list of all the medicines, herbs, non-prescription drugs, or dietary supplements you use. Also tell them if you smoke, drink alcohol, or use illegal drugs. Some items may interact with your medicine. What should I watch for while using this medicine? Your condition will be monitored carefully while you are receiving this medicine. This medicine may increase the side effects of 5-fluorouracil, 5-FU. Tell your doctor or health care professional if you have diarrhea or mouth sores that do not get better or that get worse. What side effects may I notice from receiving this medicine? Side effects that you should report to your doctor or health care  professional as soon as possible: -allergic reactions like skin rash, itching or hives, swelling of the face, lips, or tongue -breathing problems -fever, infection -mouth sores -unusual bleeding or bruising -unusually weak or tired Side effects that usually do not require medical attention (report to your doctor or health care professional if they continue or are bothersome): -constipation or diarrhea -loss of appetite -nausea, vomiting This list may not describe all possible side effects. Call your doctor for medical advice about side effects. You may report side effects to FDA at 1-800-FDA-1088. Where should I keep my medicine? This drug is given in a hospital or clinic and will not be stored at home. NOTE: This sheet is a summary. It may not cover all possible information. If you have questions about this medicine, talk to your doctor, pharmacist, or health care provider.    2016, [employer]/Gold Standard. (2008-01-15 16:50:29)  Irinotecan injection What is this medicine? IRINOTECAN (ir in oh TEE kan ) is a chemotherapy drug. It is used to treat colon and rectal cancer. This medicine may be used for other purposes; ask your health care provider or pharmacist if you have questions. What should I tell my health care provider before I take this medicine? They need to know if you have any of these conditions: -blood disorders -dehydration -diarrhea -infection (especially a virus infection such as chickenpox, cold sores, or herpes) -  liver disease -low blood counts, like low white cell, platelet, or red cell counts -recent or ongoing radiation therapy -an unusual or allergic reaction to irinotecan, sorbitol, other chemotherapy, other medicines, foods, dyes, or preservatives -pregnant or trying to get pregnant -breast-feeding How should I use this medicine? This drug is given as an infusion into a vein. It is administered in a hospital or clinic by a specially trained health care  professional. Talk to your pediatrician regarding the use of this medicine in children. Special care may be needed. Overdosage: If you think you have taken too much of this medicine contact a poison control center or emergency room at once. NOTE: This medicine is only for you. Do not share this medicine with others. What if I miss a dose? It is important not to miss your dose. Call your doctor or health care professional if you are unable to keep an appointment. What may interact with this medicine? Do not take this medicine with any of the following medications: -atazanavir -certain medicines for fungal infections like itraconazole and ketoconazole -St. John's Wort This medicine may also interact with the following medications: -dexamethasone -diuretics -laxatives -medicines for seizures like carbamazepine, mephobarbital, phenobarbital, phenytoin, primidone -medicines to increase blood counts like filgrastim, pegfilgrastim, sargramostim -prochlorperazine -vaccines This list may not describe all possible interactions. Give your health care provider a list of all the medicines, herbs, non-prescription drugs, or dietary supplements you use. Also tell them if you smoke, drink alcohol, or use illegal drugs. Some items may interact with your medicine. What should I watch for while using this medicine? Your condition will be monitored carefully while you are receiving this medicine. You will need important blood work done while you are taking this medicine. This drug may make you feel generally unwell. This is not uncommon, as chemotherapy can affect healthy cells as well as cancer cells. Report any side effects. Continue your course of treatment even though you feel ill unless your doctor tells you to stop. In some cases, you may be given additional medicines to help with side effects. Follow all directions for their use. You may get drowsy or dizzy. Do not drive, use machinery, or do anything  that needs mental alertness until you know how this medicine affects you. Do not stand or sit up quickly, especially if you are an older patient. This reduces the risk of dizzy or fainting spells. Call your doctor or health care professional for advice if you get a fever, chills or sore throat, or other symptoms of a cold or flu. Do not treat yourself. This drug decreases your body's ability to fight infections. Try to avoid being around people who are sick. This medicine may increase your risk to bruise or bleed. Call your doctor or health care professional if you notice any unusual bleeding. Be careful brushing and flossing your teeth or using a toothpick because you may get an infection or bleed more easily. If you have any dental work done, tell your dentist you are receiving this medicine. Avoid taking products that contain aspirin, acetaminophen, ibuprofen, naproxen, or ketoprofen unless instructed by your doctor. These medicines may hide a fever. Do not become pregnant while taking this medicine. Women should inform their doctor if they wish to become pregnant or think they might be pregnant. There is a potential for serious side effects to an unborn child. Talk to your health care professional or pharmacist for more information. Do not breast-feed an infant while taking this medicine. What side  effects may I notice from receiving this medicine? Side effects that you should report to your doctor or health care professional as soon as possible: -allergic reactions like skin rash, itching or hives, swelling of the face, lips, or tongue -low blood counts - this medicine may decrease the number of white blood cells, red blood cells and platelets. You may be at increased risk for infections and bleeding. -signs of infection - fever or chills, cough, sore throat, pain or difficulty passing urine -signs of decreased platelets or bleeding - bruising, pinpoint red spots on the skin, black, tarry stools,  blood in the urine -signs of decreased red blood cells - unusually weak or tired, fainting spells, lightheadedness -breathing problems -chest pain -diarrhea -feeling faint or lightheaded, falls -flushing, runny nose, sweating during infusion -mouth sores or pain -pain, swelling, redness or irritation where injected -pain, swelling, warmth in the leg -pain, tingling, numbness in the hands or feet -problems with balance, talking, walking -stomach cramps, pain -trouble passing urine or change in the amount of urine -vomiting as to be unable to hold down drinks or food -yellowing of the eyes or skin Side effects that usually do not require medical attention (report to your doctor or health care professional if they continue or are bothersome): -constipation -hair loss -headache -loss of appetite -nausea, vomiting -stomach upset This list may not describe all possible side effects. Call your doctor for medical advice about side effects. You may report side effects to FDA at 1-800-FDA-1088. Where should I keep my medicine? This drug is given in a hospital or clinic and will not be stored at home. NOTE: This sheet is a summary. It may not cover all possible information. If you have questions about this medicine, talk to your doctor, pharmacist, or health care provider.    2016, [employer] Standard. (2013-01-07 16:29:32)

## 2016-06-02 NOTE — Progress Notes (Signed)
[name]  [phone] [phone]  Clinic follow up Note   Patient Care Team: Carol Ada, MD as PCP - General (Family Medicine) Rolm Bookbinder, MD as Consulting Physician (General Surgery) 06/02/2016   CHIEF COMPLAINTS:  Follow up stage III colon cancer   Oncology History   Cancer of right colon [facility])   Staging form: Colon and Rectum, AJCC 7th Edition     Pathologic stage from 08/03/2015: Stage IIIB (T4a, N1a, cM0) - Signed by Truitt Merle, MD on 09/04/2015       Cancer of right colon ([name])   03/31/2015 Imaging    CT abdomen showed appendicitis, perforated, with periappendiceal abscess. And small pulmonary nodules.      04/03/2015 Procedure    CT guided placement of going Through into the right lower abdominal quadrant with aspiration of a total of 30 mL prudent fluid.       08/03/2015 Initial Diagnosis    Cancer of right colon ([name])      08/03/2015 Surgery    Right hemicolectomy      08/03/2015 Pathology Results    Right hemicolectomy showed invasive adenocarcinoma, moderately differentiated, likely arising from a cecal valve. One out of 40 lymph nodes were positive, (+) LVI, margins negative       08/03/2015 Miscellaneous    colon cance MSI-stable       05/20/2016 Imaging    PET scan revealed hypermetabolic peritoneal carcinomatosis, five scatted pulmonary nodules in both lungs, largest 4 mm, metastasis not excluded. Hypermetabolic mass in the deep subcutaneous ventral right lateral pelvic wall, probable metastasis.       HISTORY OF PRESENTING ILLNESS:  [name] 50 y.o. female without significant PMH is here because of her recently diagnosed stage III colon cancer. She is accompanied by her boyfriend to the clinic today.  She developed fever and headache in September 2016, was found to have ruptured appendix and was admitted to hospital. CT scan revealed a peri-appendiceal abscess. She underwent abscess drainage tube basement  by interventional radiology on 04/17/2015, and a course of antibiotics. She had a multiple drainage tube exchange by IR, but the abscess did not heal. She was referred to Dr. Donne [name]. Multiple CT scan in October and November showed persistent peri-appendiceal fluid collection. She was finally taken to the operation room on 08/03/2015. Intraoperatively, she was found to have a 2 cm whole present in the cecum, she underwent ileocectomy with an anastomosis in the hepatic fixture. 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  She had wound infection issue after surgery. It has been healing slowly, she is still on wet-to-dry dressing change twice daily, has home care nurse. She has moderate pain, 6-7/10, sometime postional, she is on oxycodone 2-3 times a day. Her appetite and eating is getting better, lost about 25lb since 03/2015, no fever and chills for the past 3-4 days, just finished abx yesterday.   CURRENT THERAPY: chemotherapy FOLFIRI every 2 weeks starting on 06/02/2016  INTERIM HISTORY:  Taila returns for follow-up and initiating chemo. She had port placed and liver biopsy by IR this morning, she tolerated the procedure well. She has moderate abdominal pain, take oxycodone 43m 5-6 tabs a day, with mild bloating and constipation. Her appetite is moderate to low, she lost 3 lbs in the past two weeks.   MEDICAL HISTORY:  Past Medical History:  Diagnosis Date  . Anemia   . Anxiety   . Depression   . Fibromyalgia   .  GERD (gastroesophageal reflux disease)   . JP drain bleeding     SURGICAL HISTORY: Past Surgical History:  Procedure Laterality Date  . ABDOMINAL HYSTERECTOMY    . COLON SURGERY  08/03/2015  . COLOSTOMY REVISION N/A 08/03/2015   Procedure: COLON RESECTION RIGHT;  Surgeon: Rolm Bookbinder, MD;  Location: [place];  Service: General;  Laterality: N/A;  . GASTRIC BYPASS    . IR GENERIC HISTORICAL  05/30/2016   IR FLUORO GUIDE PORT INSERTION RIGHT 05/30/2016 Aletta Edouard, MD [facility]  . IR GENERIC HISTORICAL  05/30/2016   IR US GUIDE VASC ACCESS RIGHT 05/30/2016 Aletta Edouard, MD [facility]  . LAPAROSCOPIC ILEOCECECTOMY Right 08/03/2015   Procedure: LAPAROSCOPIC DIAGNOSTIC RIGHT COLECTOMY;  Surgeon: Rolm Bookbinder, MD;  Location: [place];  Service: General;  Laterality: Right;    SOCIAL HISTORY: Social History   Social History  . Marital status: Married    Spouse name: N/A  . Number of children: N/A  . Years of education: N/A   Occupational History  . Not on file.   Social History Main Topics  . Smoking status: Never Smoker  . Smokeless tobacco: Never Used  . Alcohol use 1.2 oz/week    2 Glasses of wine per week     Comment: moderate 1-2 times a month   . Drug use: No  . Sexual activity: Not on file   Other Topics Concern  . Not on file   Social History Narrative   Single, fiance [name]   Works [employer] 11:30am to 8 pm    FAMILY HISTORY: Family History  Problem Relation Age of Onset  . Cancer Father 65    small cell lung cancer and colon cancer   . Cancer Sister 50    small cell lung cancer  . Cancer Maternal Aunt     ovarian cancer  . Cancer Paternal Uncle     brain cancer   . Cancer Other 40    ovarian cancer (maternal niece)    ALLERGIES:  is allergic to latex; cymbalta [duloxetine hcl]; and penicillins.  MEDICATIONS:  Current Outpatient Prescriptions  Medication Sig Dispense Refill  . acetaminophen (TYLENOL) 500 MG tablet Take 1,000 mg by mouth every 6 (six) hours as needed for fever. Reported on 09/24/2015    . ALPRAZolam (XANAX) 0.25 MG tablet Take 1-2 tablets (0.25-0.5 mg total) by mouth at bedtime as needed for anxiety or sleep. 60 tablet 0  . bisacodyl (DULCOLAX) 5 MG EC tablet Take 10 mg by mouth daily as needed for moderate constipation.    . capecitabine (XELODA) 500 MG tablet Take 3 tab in the morning and 2 tab in evening. Take on days 1-14 of chemotherapy, then 7 days off (Patient  not taking: Reported on 05/23/2016) 70 tablet 2  . cyclobenzaprine (FLEXERIL) 10 MG tablet Take 1 tablet (10 mg total) by mouth 3 (three) times daily as needed for muscle spasms. 60 tablet 0  . dexamethasone (DECADRON) 4 MG tablet Take 2 tablets (8 mg total) by mouth daily. Start day after chemo and take for 3 days after each IV chemo (Patient not taking: Reported on 05/23/2016) 30 tablet 1  . diphenhydrAMINE (BENADRYL) 25 mg capsule Take 25 mg by mouth every 6 (six) hours as needed for sleep.    Marland Kitchen docusate sodium (COLACE) 100 MG capsule Take 300 mg by mouth daily.    Marland Kitchen escitalopram (LEXAPRO) 10 MG tablet Take 10 mg by mouth daily.    Marland Kitchen  ferrous sulfate 325 (65 FE) MG tablet Take 325 mg by mouth 2 (two) times daily with a meal. Reported on 10/12/2015    . lidocaine-prilocaine (EMLA) cream Apply 1 application topically as needed. 30 g 2  . ondansetron (ZOFRAN) 8 MG tablet Take 1 tablet (8 mg total) by mouth as directed. Take twice daily as needed for nausea-start on day 3 of chemotherapy (Patient not taking: Reported on 05/23/2016) 30 tablet 1  . ondansetron (ZOFRAN) 8 MG tablet Take 1 tablet (8 mg total) by mouth 2 (two) times daily as needed for refractory nausea / vomiting. Start on day 3 after chemotherapy. 30 tablet 1  . oxyCODONE (OXY IR/ROXICODONE) 5 MG immediate release tablet Take 1 tablet (5 mg total) by mouth every 4 (four) hours as needed for severe pain. 60 tablet 0  . potassium chloride SA (K-DUR,KLOR-CON) 20 MEQ tablet Take 1 tab PO TID x 1 day; then take 1 tab PO BID till gone. (Patient not taking: Reported on 05/23/2016) 14 tablet 1  . prochlorperazine (COMPAZINE) 10 MG tablet Take 1 tablet (10 mg total) by mouth every 8 (eight) hours as needed for nausea or vomiting. (Patient not taking: Reported on 05/23/2016) 30 tablet 1  . prochlorperazine (COMPAZINE) 10 MG tablet Take 1 tablet (10 mg total) by mouth every 6 (six) hours as needed (NAUSEA). 30 tablet 1  . traMADol (ULTRAM) 50 MG tablet  Take 1-2 tablets (50-100 mg total) by mouth every 6 (six) hours as needed. 60 tablet 0  . urea (CARMOL) 10 % cream Apply topically as needed. (Patient not taking: Reported on 05/23/2016) 71 g 1  . vitamin B-12 (CYANOCOBALAMIN) 1000 MCG tablet Take 500 mcg by mouth daily. Reported on 09/24/2015    . vitamin C (ASCORBIC ACID) 500 MG tablet Take 500 mg by mouth daily.     No current facility-administered medications for this visit.    Facility-Administered Medications Ordered in Other Visits  Medication Dose Route Frequency Provider Last Rate Last Dose  . clindamycin (CLEOCIN) 900 mg in dextrose 5 % 50 mL IVPB  900 mg Intravenous 60 min Pre-Op Rolm Bookbinder, MD       And  . gentamicin (GARAMYCIN) 310 mg in dextrose 5 % 50 mL IVPB  5 mg/kg Intravenous 60 min Pre-Op Rolm Bookbinder, MD        REVIEW OF SYSTEMS:   Constitutional: Denies fevers, chills or abnormal night sweats Eyes: Denies blurriness of vision, double vision or watery eyes Ears, nose, mouth, throat, and face: Denies mucositis or sore throat Respiratory: Denies cough, dyspnea or wheezes Cardiovascular: Denies palpitation, chest discomfort or lower extremity swelling Gastrointestinal:  Denies nausea, heartburn or change in bowel habits Skin: Denies abnormal skin rashes Lymphatics: Denies new lymphadenopathy or easy bruising Neurological:Denies numbness, tingling or new weaknesses Behavioral/Psych: Mood is stable, no new changes  All other systems were reviewed with the patient and are negative.  PHYSICAL EXAMINATION: ECOG PERFORMANCE STATUS: 1 - Symptomatic but completely ambulatory  Vitals:   06/02/16 1302  BP: 113/76  Pulse: (!) 102  Resp: 18  Temp: 98.3 F (36.8 C)   Filed Weights   06/02/16 1302  Weight: 128 lb 14.4 oz (58.5 kg)    GENERAL:alert, no distress and comfortable SKIN: skin color, texture, turgor are normal, no rashes or significant lesions EYES: normal, conjunctiva are pink and non-injected,  sclera clear OROPHARYNX:no exudate, no erythema and lips, buccal mucosa, and tongue normal  NECK: supple, thyroid normal size, non-tender, without nodularity LYMPH:  no palpable lymphadenopathy in the cervical, axillary or inguinal   LUNGS: clear to auscultation and percussion with normal breathing effort HEART: regular rate & rhythm and no murmurs and no lower extremity edema ABDOMEN:abdomen soft, non-tender and normal bowel sounds,  Her midline surgical wound has completely healed, and appears to be dry without any discharge.  Musculoskeletal:no cyanosis of digits and no clubbing  PSYCH: alert & oriented x 3 with fluent speech NEURO: no focal motor/sensory deficits  LABORATORY DATA:  I have reviewed the data as listed CBC Latest Ref Rng & Units 05/30/2016 04/29/2016 03/18/2016  WBC 4.0 - 10.5 K/uL 5.7 6.6 6.3  Hemoglobin 12.0 - 15.0 g/dL 10.4(L) 11.6 11.8  Hematocrit 36.0 - 46.0 % 32.7(L) 35.5 35.6  Platelets 150 - 400 K/uL 377 267 256   CMP Latest Ref Rng & Units 04/29/2016 03/18/2016 02/26/2016  Glucose 70 - 140 mg/dl 84 99 110  BUN 7.0 - 26.0 mg/dL 6.3(L) 8.1 9.1  Creatinine 0.6 - 1.1 mg/dL 0.6 0.7 0.7  Sodium 136 - 145 mEq/L 140 139 139  Potassium 3.5 - 5.1 mEq/L 3.9 4.4 3.9  Chloride 101 - 111 mmol/L - - -  CO2 22 - 29 mEq/L 24 25 22   Calcium 8.4 - 10.4 mg/dL 9.2 10.0 9.5  Total Protein 6.4 - 8.3 g/dL 6.9 7.8 7.4  Total Bilirubin 0.20 - 1.20 mg/dL <0.30 <0.30 0.35  Alkaline Phos 40 - 150 U/L 145 134 131  AST 5 - 34 U/L 18 17 17   ALT 0 - 55 U/L 14 12 14    PATHOLOGY REPORT Diagnosis 08/03/2015 Colon, segmental resection, Right - INVASIVE ADENOCARCINOMA WITH ABUNDANT EXTRACELLULAR MUCIN, MODERATELY DIFFERENTIATED, LIKELY ARISING FROM ILEOCECAL VALVE. - ADENOCARCINOMA EXTENDS AT LEAST INTO THE PERICOLONIC SOFT TISSUE AND IS ASSOCIATED WITH TRANSMURAL DEFECT. - THE PROXIMAL AND DISTAL RESECTION MARGINS ARE NEGATIVE FOR ADENOCARCINOMA. - ACELLULAR MUCIN IS GROSSLY PRESENT AT THE  SEROSAL SURFACE. - LYMPHOVASCULAR INVASION IS IDENTIFIED. - METASTATIC CARCINOMA IN 1 OF 40 LYMPH NODES (1/40). - SEE ONCOLOGY TABLE BELOW. Microscopic Comment COLON AND RECTUM (INCLUDING TRANS-ANAL RESECTION): Specimen: Right colon and terminal ileum. Procedure: Resection. Tumor site: Likely ileocecal valve. Specimen integrity: Transmural defect(s). Macroscopic intactness of mesorectum: N/A Macroscopic tumor perforation: Present. Invasive tumor: Maximum size: At least 2.5 cm. Histologic type(s): Adenocarcinoma with abundant extracellular mucin. Histologic grade and differentiation: G2: moderately differentiated. Type of polyp in which invasive carcinoma arose: Tubular adenoma. Microscopic extension of invasive tumor: Adenocarcinoma extends into pericolonic soft tissue and acellular mucin is grossly present at the serosal surface. Lymph-Vascular invasion: Present. Peri-neural invasion: Not identified. Tumor deposit(s) (discontinuous extramural extension): Not identified. Resection margins: Proximal margin: Negative for carcinoma. Distal margin: Negative for carcinoma. Circumferential (radial) (posterior ascending, posterior descending; lateral and posterior mid-rectum; and entire lower 1/3 rectum): Acellular mucin is present at the circumferential tissue edge. Treatment effect (neo-adjuvant therapy): N/A Additional polyp(s): Not identified. Non-neoplastic findings: No significant findings. Lymph nodes: number examined 40; number positive: 1 Pathologic Staging: at least pT4a, pN1a. Ancillary studies: MMR by IHC and MSI by PCR will be performed and the results reported separately. Additional studies can be performed upon clinician request.   ADDITIONAL INFORMATION: Mismatch Repair (MMR) Protein Immunohistochemistry (IHC) IHC Expression Result: MLH1: EQUIVOCAL MSH2: EQUIVOCAL MSH6: EQUIVOCAL PMS2: EQUIVOCAL * Internal control demonstrates intact nuclear  expression Interpretation: EQUIVOCAL The tumor shows partial staining with all anibodies and hence the results are considered equivocal. Correlated with molecular based MSI testing is strongly recommended.    RADIOGRAPHIC STUDIES: I  have personally reviewed the radiological images as listed and agreed with the findings in the report. Nm Pet Image Restag (ps) Skull Base To Thigh  Result Date: 05/20/2016 CLINICAL DATA:  Subsequent treatment strategy for invasive colonic adenocarcinoma arising from the cecal valve status post right hemicolectomy 08/03/2015, status post adjuvant chemotherapy, with suspected new peritoneal carcinomatosis on recent staging CT. EXAM: NUCLEAR MEDICINE PET SKULL BASE TO THIGH TECHNIQUE: 7.2 mCi F-18 FDG was injected intravenously. Full-ring PET imaging was performed from the skull base to thigh after the radiotracer. CT data was obtained and used for attenuation correction and anatomic localization. FASTING BLOOD GLUCOSE:  Value: 76 mg/dl COMPARISON:  04/29/2016 CT chest, abdomen and pelvis. FINDINGS: NECK No hypermetabolic lymph nodes in the neck. Symmetric mild hypermetabolism at the base of the tongue is favored to be reactive/physiologic. CHEST Nonenlarged 0.6 cm left axillary node with borderline mild hypermetabolism (max SUV 2.7). No additional hypermetabolic axillary nodes. No hypermetabolic mediastinal or hilar nodes. Left circumflex and right coronary atherosclerosis. Atherosclerotic nonaneurysmal thoracic aorta. No pleural effusions. Left upper lobe 4 mm solid pulmonary nodule (series 8/image 32) is new since 09/03/2015. Right upper lobe 4 mm solid pulmonary nodule (series 8/image 35) is new since 09/03/2015. There are 3 scattered peripheral right lower lobe solid pulmonary nodules, all stable since 09/03/2015. These nodules are below PET resolution and not associated with significant metabolism. No acute consolidative airspace disease. ABDOMEN/PELVIS Multifocal nearly  confluent low-attenuation mildly hypermetabolic extracapsular peritoneal masses scalloping the entire liver contour, with representative lesions as follows: - extracapsular 5.4 x 2.6 cm mass at the anterior right liver lobe with max SUV 4.9 (series 4/image 90) - extracapsular 2.3 x 2.2 cm mass anterior to the gallbladder with max SUV 3.1 (series 4/image 100) Multiple hypermetabolic omental masses, with a representative conglomerate 2.4 x 1.7 cm mass with max SUV 4.2 (series 4/image 135). Multiple mildly hypermetabolic mesenteric masses, with a representative 2.1 x 1.8 cm left mesenteric mass with max SUV 3.2 (series 4/image 118). Multiple mildly hypermetabolic left upper quadrant peritoneal masses, with a representative 2.0 x 1.3 cm mass superior to the splenic hilum with max SUV 3.5 (series 4/image 88). Multiple mildly hypermetabolic pelvic peritoneal masses, with a representative 2.8 x 2.2 cm left pelvic mass near the sigmoid colon with max SUV 3.8 (series 4/image 150). Mildly hypermetabolic 2.7 x 1.8 cm deep subcutaneous mass with max SUV 3.3 in the lateral ventral right pelvic wall (series 4/image 137). Small volume pelvic ascites. No abnormal hypermetabolic activity within the pancreas, adrenal glands, or spleen. No hypermetabolic lymph nodes in the abdomen or pelvis. Stable postsurgical changes from gastric bypass surgery and right hemicolectomy with ileocolic anastomosis in the right abdomen. No dilated small bowel loops. Moderate stool throughout the remnant large-bowel. Hysterectomy. SKELETON No focal hypermetabolic activity to suggest skeletal metastasis. IMPRESSION: 1. Peritoneal carcinomatosis with hypermetabolic peritoneal masses throughout the perihepatic space, left upper quadrant, omentum, mesenteric and pelvic peritoneum. Small volume pelvic ascites. 2. Hypermetabolic mass in the deep subcutaneous ventral right lateral pelvic wall, probably a metastasis. 3. Five scattered pulmonary nodules in both  lungs, largest 4 mm, below PET resolution, two of which in the upper lobes are new since 09/03/2015, cannot exclude pulmonary metastases. 4. Nonenlarged mildly hypermetabolic left axillary lymph node, nonspecific, favor benign injection related uptake (radiotracer was injected in a left hand peripheral IV). 5. Additional findings include aortic atherosclerosis and 2 vessel coronary atherosclerosis. Electronically Signed   By: Ilona Sorrel M.D.   On: 05/20/2016 20:02  Ir US Guide Vasc Access Right  Result Date: 05/30/2016 CLINICAL DATA:  Colon carcinoma with peritoneal carcinomatosis. Port required to begin chemotherapy. EXAM: IMPLANTED PORT A CATH PLACEMENT WITH ULTRASOUND AND FLUOROSCOPIC GUIDANCE ANESTHESIA/SEDATION: 1.5 mg IV Versed; 100 mcg IV Fentanyl Total Moderate Sedation Time:  36 minutes The patient's level of consciousness and physiologic status were continuously monitored during the procedure by Radiology nursing. Additional Medications: 1 g IV vancomycin. Vancomycin was given within two hours of incision. Vancomycin was given due to an antibiotic allergy. FLUOROSCOPY TIME:  24 seconds.  3.0 mGy. PROCEDURE: The procedure, risks, benefits, and alternatives were explained to the patient. Questions regarding the procedure were encouraged and answered. The patient understands and consents to the procedure. A time-out was performed prior to initiating the procedure. Ultrasound was utilized to confirm patency of the right internal jugular vein. The right neck and chest were prepped with chlorhexidine in a sterile fashion, and a sterile drape was applied covering the operative field. Maximum barrier sterile technique with sterile gowns and gloves were used for the procedure. Local anesthesia was provided with 1% lidocaine. After creating a small venotomy incision, a 21 gauge needle was advanced into the right internal jugular vein under direct, real-time ultrasound guidance. Ultrasound image documentation  was performed. After securing guidewire access, an 8 Fr dilator was placed. A J-wire was kinked to measure appropriate catheter length. A subcutaneous port pocket was then created along the upper chest wall utilizing sharp and blunt dissection. Portable cautery was utilized. The pocket was irrigated with sterile saline. A single lumen power injectable port was chosen for placement. The 8 Fr catheter was tunneled from the port pocket site to the venotomy incision. The port was placed in the pocket. External catheter was trimmed to appropriate length based on guidewire measurement. At the venotomy, an 8 Fr peel-away sheath was placed over a guidewire. The catheter was then placed through the sheath and the sheath removed. Final catheter positioning was confirmed and documented with a fluoroscopic spot image. The port was accessed with a needle and aspirated and flushed with heparinized saline. The access needle was removed. The venotomy and port pocket incisions were closed with subcutaneous 3-0 Monocryl and subcuticular 4-0 Vicryl. Dermabond was applied to both incisions. COMPLICATIONS: COMPLICATIONS None FINDINGS: After catheter placement, the tip lies at the cavo-atrial junction. The catheter aspirates normally and is ready for immediate use. IMPRESSION: Placement of single lumen port a cath via right internal jugular vein. The catheter tip lies at the cavo-atrial junction. A power injectable port a cath was placed and is ready for immediate use. Electronically Signed   By: Aletta Edouard M.D.   On: 05/30/2016 15:24   Ct Biopsy  Result Date: 05/30/2016 CLINICAL DATA:  Cecal carcinoma with peritoneal masses. EXAM: CT GUIDED CORE BIOPSY OF PERITONEAL MASS ANESTHESIA/SEDATION: 2.0 mg IV Versed; 100 mcg IV Fentanyl Total Moderate Sedation Time:  15 minutes. The patient's level of consciousness and physiologic status were continuously monitored during the procedure by Radiology nursing. PROCEDURE: The procedure  risks, benefits, and alternatives were explained to the patient. Questions regarding the procedure were encouraged and answered. The patient understands and consents to the procedure. A time-out was performed prior to initiating the procedure. The left abdominal wall was prepped with chlorhexidine in a sterile fashion, and a sterile drape was applied covering the operative field. A sterile gown and sterile gloves were used for the procedure. Local anesthesia was provided with 1% Lidocaine. Under CT guidance, a 17  gauge trocar needle was advanced to the level of a left anterior peritoneal mass. After confirming needle tip position, 3 separate coaxial 18 gauge core biopsy samples were obtained. These were submitted in formalin. Additional CT was performed. COMPLICATIONS: None FINDINGS: Nodular peritoneal tumor in the anterior left lower quadrant was targeted located just deep to the abdominal wall. Solid tissue was obtained. There was no evidence of complication by CT. IMPRESSION: CT-guided core biopsy performed of peritoneal mass in the left lower quadrant. Electronically Signed   By: Aletta Edouard M.D.   On: 05/30/2016 15:22   Ir Fluoro Guide Port Insertion Right  Result Date: 05/30/2016 CLINICAL DATA:  Colon carcinoma with peritoneal carcinomatosis. Port required to begin chemotherapy. EXAM: IMPLANTED PORT A CATH PLACEMENT WITH ULTRASOUND AND FLUOROSCOPIC GUIDANCE ANESTHESIA/SEDATION: 1.5 mg IV Versed; 100 mcg IV Fentanyl Total Moderate Sedation Time:  36 minutes The patient's level of consciousness and physiologic status were continuously monitored during the procedure by Radiology nursing. Additional Medications: 1 g IV vancomycin. Vancomycin was given within two hours of incision. Vancomycin was given due to an antibiotic allergy. FLUOROSCOPY TIME:  24 seconds.  3.0 mGy. PROCEDURE: The procedure, risks, benefits, and alternatives were explained to the patient. Questions regarding the procedure were  encouraged and answered. The patient understands and consents to the procedure. A time-out was performed prior to initiating the procedure. Ultrasound was utilized to confirm patency of the right internal jugular vein. The right neck and chest were prepped with chlorhexidine in a sterile fashion, and a sterile drape was applied covering the operative field. Maximum barrier sterile technique with sterile gowns and gloves were used for the procedure. Local anesthesia was provided with 1% lidocaine. After creating a small venotomy incision, a 21 gauge needle was advanced into the right internal jugular vein under direct, real-time ultrasound guidance. Ultrasound image documentation was performed. After securing guidewire access, an 8 Fr dilator was placed. A J-wire was kinked to measure appropriate catheter length. A subcutaneous port pocket was then created along the upper chest wall utilizing sharp and blunt dissection. Portable cautery was utilized. The pocket was irrigated with sterile saline. A single lumen power injectable port was chosen for placement. The 8 Fr catheter was tunneled from the port pocket site to the venotomy incision. The port was placed in the pocket. External catheter was trimmed to appropriate length based on guidewire measurement. At the venotomy, an 8 Fr peel-away sheath was placed over a guidewire. The catheter was then placed through the sheath and the sheath removed. Final catheter positioning was confirmed and documented with a fluoroscopic spot image. The port was accessed with a needle and aspirated and flushed with heparinized saline. The access needle was removed. The venotomy and port pocket incisions were closed with subcutaneous 3-0 Monocryl and subcuticular 4-0 Vicryl. Dermabond was applied to both incisions. COMPLICATIONS: COMPLICATIONS None FINDINGS: After catheter placement, the tip lies at the cavo-atrial junction. The catheter aspirates normally and is ready for immediate  use. IMPRESSION: Placement of single lumen port a cath via right internal jugular vein. The catheter tip lies at the cavo-atrial junction. A power injectable port a cath was placed and is ready for immediate use. Electronically Signed   By: Aletta Edouard M.D.   On: 05/30/2016 15:24    ASSESSMENT & PLAN:  50 year old Caucasian female, with past medical history of fibromyalgia, depression, presented with ruptured cecum and pericolonic abscess, required multiple drainage, antibiotics, and eventually right colectomy.  1. Right colon cancer, cecum, moderately  differentiated adenocarcinoma, [id number], stage IIIA, with perforation, MSI-stable  - I previously reviewed her scan findings, and surgical pathology results in great details with patient and her Husband.  - she has locally advanced stage III  Disease, with particular high risk features of T4 tumor, proliferation for 3-4 months,  And positive lymph nodes, she is at very high risk for cancer recurrence, especially peritoneal carcinomatosis. -She has completed adjuvant chemotherapy CAPOX, oxaliplatin dose was significantly reduced due to her poor tolerance. 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 No other significant metastasis on the CT scan. We discussed that the metastasis is not definitive based on CT, and needs to be confirmed by biopsy  -I reviewed her PET scan from 05/20/16, which showed hypermetabolic peritoneal carcinomatosis, in a few small lung nodules which are indeterminate. -She underwent a liver biopsy this morning, results still pending, will request [facility] genomic testing  -I recommend her to start chemotherapy with 5-FU and irinotecan, will add Avast or panitumumab based on FO result --Chemotherapy consent: Side effects including but does not not limited to, fatigue, nausea, vomiting, diarrhea, hair loss, neuropathy, fluid retention, renal and kidney  dysfunction, neutropenic fever, needed for blood transfusion, bleeding, coronary artery spasm and heart attack, heart failure, were discussed with patient in great detail. She agrees to proceed. -The goal of chemotherapy is palliative -Lab results reviewed with her, adequate for treatment, we'll proceed with cycle today. She previously tolerated oxaliplatin very poorly, I'll reduce her irinotecan by 25% today, if she tolerates well, will do full dose next cycle   2. Abdominal pain -Likely related to her cancer recurrence -She has been use oxycodone more lately due to her cancer recurrence, I refilled for her   3. Anemia, iron deficient anemia  - likely secondary to surgery,  Chronic infection and GI bleeding from the tumor -Iron study today showed low serum iron, transferrin saturation and ferritin 12, which is consistent with iron deficiency -She received IV Feraheme twice, responded well. Anemia near resolved now -I encouraged her to continue iron pill over-the-counter 1 tablet twice daily.   4. Fibromyalgia and depression - she will continue medication, and follow-up with her primary care physician   Plan -first dose FOLFIRI today, no 5-fu bolus, irinotecan dose reduction by 25%, no neulasta  -will see her back in 2 weeks before next cycle chemo   All questions were answered. The patient knows to call the clinic with any problems, questions or concerns. I spent 25 minutes counseling the patient face to face. The total time spent in the appointment was 30 minutes and more than 50% was on counseling.     Truitt Merle, MD 06/02/2016

## 2016-06-02 NOTE — Telephone Encounter (Signed)
Appointments scheduled per 11/9 LOS. Patient given AVS report and calendars with future scheduled appointments.  Patient stated that  she spoke with MD to have appointment scheduled specifically on 11/30. Spoke with nurse and MD to schedule patient at 11:45AM.

## 2016-06-03 ENCOUNTER — Telehealth: Payer: Self-pay | Admitting: *Deleted

## 2016-06-03 LAB — CEA: CEA1: 25.9 ng/mL — AB (ref 0.0–4.7)

## 2016-06-03 NOTE — Telephone Encounter (Signed)
Called & spoke with pt's husband & he states pt is at work & can't accept calls at work but he thinks she is doing OK with no nausea/vomiting or diarrhea.  He reports that she still looks bloated but this is not new.  He states that he has texted her a couple of times today & she seems to be OK.  Informed to have her call if any concerns.  He expressed understanding

## 2016-06-03 NOTE — Telephone Encounter (Signed)
-----   Message from Adalberto Cole, RN sent at 06/03/2016 11:08 AM EST ----- Regarding: FW: "1st time chemotherapy, per Dr. Burr Medico" I accidentally completed this patient's chemo call... Could someone call her Monday? Sorry!  She had treatment 06/02/16   ----- Message ----- From: Azzie Glatter, RN Sent: 06/02/2016   5:06 PM To: Onc Triage Nurse [facility] Subject: "1st time chemotherapy, per Dr. Burr Medico"          Patient received Camptosar, Leucovorin, Adrucil for the 1st time today, per Dr. Burr Medico.  Tolerated treatment well with no complaints.

## 2016-06-04 ENCOUNTER — Encounter: Payer: Self-pay | Admitting: Hematology

## 2016-06-04 ENCOUNTER — Ambulatory Visit (HOSPITAL_BASED_OUTPATIENT_CLINIC_OR_DEPARTMENT_OTHER): Payer: Managed Care, Other (non HMO)

## 2016-06-04 DIAGNOSIS — C182 Malignant neoplasm of ascending colon: Secondary | ICD-10-CM

## 2016-06-04 DIAGNOSIS — Z452 Encounter for adjustment and management of vascular access device: Secondary | ICD-10-CM

## 2016-06-04 MED ORDER — SODIUM CHLORIDE 0.9% FLUSH
10.0000 mL | INTRAVENOUS | Status: DC | PRN
  Administered 2016-06-04: 10 mL
  Filled 2016-06-04: qty 10

## 2016-06-04 MED ORDER — HEPARIN SOD (PORK) LOCK FLUSH 100 UNIT/ML IV SOLN
500.0000 [IU] | Freq: Once | INTRAVENOUS | Status: AC | PRN
  Administered 2016-06-04: 500 [IU]
  Filled 2016-06-04: qty 5

## 2016-06-15 ENCOUNTER — Other Ambulatory Visit: Payer: Self-pay | Admitting: Hematology

## 2016-06-23 ENCOUNTER — Ambulatory Visit (HOSPITAL_BASED_OUTPATIENT_CLINIC_OR_DEPARTMENT_OTHER): Payer: Managed Care, Other (non HMO)

## 2016-06-23 ENCOUNTER — Telehealth: Payer: Self-pay | Admitting: Hematology

## 2016-06-23 ENCOUNTER — Ambulatory Visit: Payer: Managed Care, Other (non HMO) | Admitting: Hematology

## 2016-06-23 ENCOUNTER — Encounter: Payer: Self-pay | Admitting: Hematology

## 2016-06-23 ENCOUNTER — Other Ambulatory Visit (HOSPITAL_BASED_OUTPATIENT_CLINIC_OR_DEPARTMENT_OTHER): Payer: Managed Care, Other (non HMO)

## 2016-06-23 ENCOUNTER — Ambulatory Visit: Payer: Managed Care, Other (non HMO)

## 2016-06-23 ENCOUNTER — Ambulatory Visit (HOSPITAL_BASED_OUTPATIENT_CLINIC_OR_DEPARTMENT_OTHER): Payer: Managed Care, Other (non HMO) | Admitting: Hematology

## 2016-06-23 ENCOUNTER — Other Ambulatory Visit: Payer: Managed Care, Other (non HMO)

## 2016-06-23 VITALS — BP 113/67 | HR 102 | Temp 98.9°F | Resp 18 | Ht 61.0 in | Wt 128.7 lb

## 2016-06-23 VITALS — HR 96

## 2016-06-23 DIAGNOSIS — C180 Malignant neoplasm of cecum: Secondary | ICD-10-CM

## 2016-06-23 DIAGNOSIS — M791 Myalgia: Secondary | ICD-10-CM

## 2016-06-23 DIAGNOSIS — R911 Solitary pulmonary nodule: Secondary | ICD-10-CM

## 2016-06-23 DIAGNOSIS — C182 Malignant neoplasm of ascending colon: Secondary | ICD-10-CM | POA: Diagnosis not present

## 2016-06-23 DIAGNOSIS — C786 Secondary malignant neoplasm of retroperitoneum and peritoneum: Secondary | ICD-10-CM

## 2016-06-23 DIAGNOSIS — D649 Anemia, unspecified: Secondary | ICD-10-CM

## 2016-06-23 DIAGNOSIS — D509 Iron deficiency anemia, unspecified: Secondary | ICD-10-CM

## 2016-06-23 DIAGNOSIS — R109 Unspecified abdominal pain: Secondary | ICD-10-CM

## 2016-06-23 DIAGNOSIS — Z5111 Encounter for antineoplastic chemotherapy: Secondary | ICD-10-CM

## 2016-06-23 DIAGNOSIS — F329 Major depressive disorder, single episode, unspecified: Secondary | ICD-10-CM

## 2016-06-23 DIAGNOSIS — Z95828 Presence of other vascular implants and grafts: Secondary | ICD-10-CM

## 2016-06-23 LAB — COMPREHENSIVE METABOLIC PANEL
ALT: 9 U/L (ref 0–55)
AST: 13 U/L (ref 5–34)
Albumin: 2.7 g/dL — ABNORMAL LOW (ref 3.5–5.0)
Alkaline Phosphatase: 136 U/L (ref 40–150)
Anion Gap: 10 mEq/L (ref 3–11)
BUN: 7.3 mg/dL (ref 7.0–26.0)
CALCIUM: 9.4 mg/dL (ref 8.4–10.4)
CHLORIDE: 103 meq/L (ref 98–109)
CO2: 25 meq/L (ref 22–29)
CREATININE: 0.6 mg/dL (ref 0.6–1.1)
EGFR: >90 mL/min/{1.73_m2} (ref >90)
Glucose: 93 mg/dl (ref 70–140)
Potassium: 3.9 mEq/L (ref 3.5–5.1)
Sodium: 138 mEq/L (ref 136–145)
Total Bilirubin: 0.23 mg/dL (ref 0.20–1.20)
Total Protein: 6.6 g/dL (ref 6.4–8.3)

## 2016-06-23 LAB — CBC WITH DIFFERENTIAL/PLATELET
BASO%: 0.9 % (ref 0.0–2.0)
BASOS ABS: 0.1 10*3/uL (ref 0.0–0.1)
EOS ABS: 0.2 10*3/uL (ref 0.0–0.5)
EOS%: 4.1 % (ref 0.0–7.0)
HCT: 27.5 % — ABNORMAL LOW (ref 34.8–46.6)
HGB: 8.7 g/dL — ABNORMAL LOW (ref 11.6–15.9)
LYMPH%: 34.6 % (ref 14.0–49.7)
MCH: 25.3 pg (ref 25.1–34.0)
MCHC: 31.6 g/dL (ref 31.5–36.0)
MCV: 79.9 fL (ref 79.5–101.0)
MONO#: 0.6 10*3/uL (ref 0.1–0.9)
MONO%: 12 % (ref 0.0–14.0)
NEUT#: 2.6 10*3/uL (ref 1.5–6.5)
NEUT%: 48.4 % (ref 38.4–76.8)
Platelets: 277 10*3/uL (ref 145–400)
RBC: 3.44 10*6/uL — AB (ref 3.70–5.45)
RDW: 13.6 % (ref 11.2–14.5)
WBC: 5.3 10*3/uL (ref 3.9–10.3)
lymph#: 1.9 10*3/uL (ref 0.9–3.3)

## 2016-06-23 MED ORDER — SODIUM CHLORIDE 0.9 % IJ SOLN
10.0000 mL | INTRAMUSCULAR | Status: DC | PRN
Start: 2016-06-23 — End: 2017-02-09
  Administered 2016-06-23: 10 mL
  Filled 2016-06-23: qty 10

## 2016-06-23 MED ORDER — SODIUM CHLORIDE 0.9 % IV SOLN
2400.0000 mg/m2 | INTRAVENOUS | Status: DC
  Filled 2016-06-23: qty 77

## 2016-06-23 MED ORDER — DEXTROSE 5 % IV SOLN
185.0000 mg/m2 | Freq: Once | INTRAVENOUS | Status: AC
  Administered 2016-06-23: 300 mg via INTRAVENOUS
  Filled 2016-06-23: qty 15

## 2016-06-23 MED ORDER — PALONOSETRON HCL INJECTION 0.25 MG/5ML
0.2500 mg | Freq: Once | INTRAVENOUS | Status: AC
  Administered 2016-06-23: 0.25 mg via INTRAVENOUS

## 2016-06-23 MED ORDER — DEXAMETHASONE SODIUM PHOSPHATE 10 MG/ML IJ SOLN
INTRAMUSCULAR | Status: AC
  Filled 2016-06-23: qty 1

## 2016-06-23 MED ORDER — SODIUM CHLORIDE 0.9 % IV SOLN
2400.0000 mg/m2 | INTRAVENOUS | Status: DC
  Administered 2016-06-23: 3850 mg via INTRAVENOUS
  Filled 2016-06-23: qty 77

## 2016-06-23 MED ORDER — HEPARIN SOD (PORK) LOCK FLUSH 100 UNIT/ML IV SOLN
500.0000 [IU] | Freq: Once | INTRAVENOUS | Status: DC | PRN
  Filled 2016-06-23: qty 5

## 2016-06-23 MED ORDER — DEXAMETHASONE SODIUM PHOSPHATE 10 MG/ML IJ SOLN
10.0000 mg | Freq: Once | INTRAMUSCULAR | Status: AC
Start: 2016-06-23 — End: 2016-06-23
  Administered 2016-06-23: 10 mg via INTRAVENOUS

## 2016-06-23 MED ORDER — SODIUM CHLORIDE 0.9 % IV SOLN
Freq: Once | INTRAVENOUS | Status: AC
  Administered 2016-06-23: 13:00:00 via INTRAVENOUS

## 2016-06-23 MED ORDER — LEUCOVORIN CALCIUM INJECTION 350 MG
400.0000 mg/m2 | Freq: Once | INTRAVENOUS | Status: AC
  Administered 2016-06-23: 640 mg via INTRAVENOUS
  Filled 2016-06-23: qty 32

## 2016-06-23 MED ORDER — SODIUM CHLORIDE 0.9% FLUSH
10.0000 mL | INTRAVENOUS | Status: DC | PRN
  Filled 2016-06-23: qty 10

## 2016-06-23 MED ORDER — ATROPINE SULFATE 1 MG/ML IJ SOLN: 0.5000 mg | Freq: Once | INTRAMUSCULAR | Status: DC | PRN

## 2016-06-23 MED ORDER — PALONOSETRON HCL INJECTION 0.25 MG/5ML
INTRAVENOUS | Status: AC
  Filled 2016-06-23: qty 5

## 2016-06-23 NOTE — Patient Instructions (Signed)
[facility] Discharge Instructions for Patients Receiving Chemotherapy  Today you received the following chemotherapy agents: Irinotecan, Leucovorin, Adrucil.  To help prevent nausea and vomiting after your treatment, we encourage you to take your nausea medication as directed.    If you develop nausea and vomiting that is not controlled by your nausea medication, call the clinic.   BELOW ARE SYMPTOMS THAT SHOULD BE REPORTED IMMEDIATELY:  *FEVER GREATER THAN 100.5 F  *CHILLS WITH OR WITHOUT FEVER  NAUSEA AND VOMITING THAT IS NOT CONTROLLED WITH YOUR NAUSEA MEDICATION  *UNUSUAL SHORTNESS OF BREATH  *UNUSUAL BRUISING OR BLEEDING  TENDERNESS IN MOUTH AND THROAT WITH OR WITHOUT PRESENCE OF ULCERS  *URINARY PROBLEMS  *BOWEL PROBLEMS  UNUSUAL RASH Items with * indicate a potential emergency and should be followed up as soon as possible.  Feel free to call the clinic you have any questions or concerns. The clinic phone number is [phone].  Please show the CHEMO ALERT CARD at check-in to the Emergency Department and triage nurse.   

## 2016-06-23 NOTE — Progress Notes (Signed)
[place]  [phone] [phone]  Clinic follow up Note   Patient Care Team: Tina Ada, MD as PCP - General (Family Medicine) Tina Bookbinder, MD as Consulting Physician (General Surgery) 06/23/2016   CHIEF COMPLAINTS:  Follow up metastatic colon cancer   Oncology History   Cancer of right colon [facility])   Staging form: Colon and Rectum, AJCC 7th Edition     Pathologic stage from 08/03/2015: Stage IIIB (T4a, N1a, cM0) - Signed by Tina Merle, MD on 09/04/2015       Cancer of right colon ([place])   03/31/2015 Imaging    CT abdomen showed appendicitis, perforated, with periappendiceal abscess. And small pulmonary nodules.      04/03/2015 Procedure    CT guided placement of going Through into the right lower abdominal quadrant with aspiration of a total of 30 mL prudent fluid.       08/03/2015 Initial Diagnosis    Cancer of right colon ([place])      08/03/2015 Surgery    Right hemicolectomy      08/03/2015 Pathology Results    Right hemicolectomy showed invasive adenocarcinoma, moderately differentiated, likely arising from a cecal valve. One out of 40 lymph nodes were positive, (+) LVI, margins negative       08/03/2015 Miscellaneous    colon cance MSI-stable       09/18/2015 - 03/31/2016 Chemotherapy    Adjuvant CAPOX, she tolerated oxaliplatin poorly, which was held from cycle 2 to 4 due to poor tolerance, restarted from cycle 5 with lower dose, she also had significant hand-foot syndrome from Xeloda. She completed a total of 8 cycles.      05/20/2016 Imaging    PET scan revealed hypermetabolic peritoneal carcinomatosis, five scatted pulmonary nodules in both lungs, largest 4 mm, metastasis not excluded. Hypermetabolic mass in the deep subcutaneous ventral right lateral pelvic wall, probable metastasis.      06/02/2016 -  Chemotherapy    FOLFIRI every 2 weeks, started on 06/02/2016        HISTORY OF PRESENTING ILLNESS:  [name] 50  y.o. female without significant PMH is here because of her recently diagnosed stage III colon cancer. She is accompanied by her boyfriend to the clinic today.  She developed fever and headache in September 2016, was found to have ruptured appendix and was admitted to hospital. CT scan revealed a peri-appendiceal abscess. She underwent abscess drainage tube basement by interventional radiology on 04/17/2015, and a course of antibiotics. She had a multiple drainage tube exchange by IR, but the abscess did not heal. She was referred to Dr. Donne Cooley. Multiple CT scan in October and November showed persistent peri-appendiceal fluid collection. She was finally taken to the operation room on 08/03/2015. Intraoperatively, she was found to have a 2 cm whole present in the cecum, she underwent ileocectomy with an anastomosis in the hepatic fixture. 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  She had wound infection issue after surgery. It has been healing slowly, she is still on wet-to-dry dressing change twice daily, has home care nurse. She has moderate pain, 6-7/10, sometime postional, she is on oxycodone 2-3 times a day. Her appetite and eating is getting better, lost about 25lb since 03/2015, no fever and chills for the past 3-4 days, just finished abx yesterday.   CURRENT THERAPY: chemotherapy FOLFIRI every 2 weeks starting on 06/02/2016  INTERIM HISTORY:  [name] returns for follow-up and second cycle chemo. She overall tolerated her first  cycle chemotherapy well, had mild fatigue  and nausea, no vomiting, or other side effects. She has mild constipation, for which she takes magnesium citrate once a week. She is able to continue her work. She noticed her abdominal pain has improved some since she started chemotherapy, she is taking less oxycodone. No other new complaints. Her weight has been stable.  MEDICAL HISTORY:  Past Medical History:  Diagnosis Date  . Anemia   . Anxiety   .  Depression   . Fibromyalgia   . GERD (gastroesophageal reflux disease)   . JP drain bleeding     SURGICAL HISTORY: Past Surgical History:  Procedure Laterality Date  . ABDOMINAL HYSTERECTOMY    . COLON SURGERY  08/03/2015  . COLOSTOMY REVISION N/A 08/03/2015   Procedure: COLON RESECTION RIGHT;  Surgeon: Tina Bookbinder, MD;  Location: [place];  Service: General;  Laterality: N/A;  . GASTRIC BYPASS    . IR GENERIC HISTORICAL  05/30/2016   IR FLUORO GUIDE PORT INSERTION RIGHT 05/30/2016 Tina Edouard, MD [facility]INTER[facility]  . IR GENERIC HISTORICAL  05/30/2016   IR US GUIDE VASC ACCESS RIGHT 05/30/2016 Tina Edouard, MD [facility]  . LAPAROSCOPIC ILEOCECECTOMY Right 08/03/2015   Procedure: LAPAROSCOPIC DIAGNOSTIC RIGHT COLECTOMY;  Surgeon: Tina Bookbinder, MD;  Location: [place];  Service: General;  Laterality: Right;    SOCIAL HISTORY: Social History   Social History  . Marital status: Married    Spouse name: N/A  . Number of children: N/A  . Years of education: N/A   Occupational History  . Not on file.   Social History Main Topics  . Smoking status: Never Smoker  . Smokeless tobacco: Never Used  . Alcohol use 1.2 oz/week    2 Glasses of wine per week     Comment: moderate 1-2 times a month   . Drug use: No  . Sexual activity: Not on file   Other Topics Concern  . Not on file   Social History Narrative   Single, fiance [name]   Works [employer] 11:30am to 8 pm    FAMILY HISTORY: Family History  Problem Relation Age of Onset  . Cancer Father 33    small cell lung cancer and colon cancer   . Cancer Sister 50    small cell lung cancer  . Cancer Maternal Aunt     ovarian cancer  . Cancer Paternal Uncle     brain cancer   . Cancer Other 40    ovarian cancer (maternal niece)    ALLERGIES:  is allergic to latex; cymbalta [duloxetine hcl]; and penicillins.  MEDICATIONS:  Current Outpatient Prescriptions  Medication Sig Dispense Refill  .  acetaminophen (TYLENOL) 500 MG tablet Take 1,000 mg by mouth every 6 (six) hours as needed for fever. Reported on 09/24/2015    . ALPRAZolam (XANAX) 0.25 MG tablet Take 1-2 tablets (0.25-0.5 mg total) by mouth at bedtime as needed for anxiety or sleep. 60 tablet 0  . bisacodyl (DULCOLAX) 5 MG EC tablet Take 10 mg by mouth daily as needed for moderate constipation.    . cyclobenzaprine (FLEXERIL) 10 MG tablet Take 1 tablet (10 mg total) by mouth 3 (three) times daily as needed for muscle spasms. 60 tablet 0  . dexamethasone (DECADRON) 4 MG tablet Take 2 tablets (8 mg total) by mouth daily. Start day after chemo and take for 3 days after each IV chemo 30 tablet 1  . diphenhydrAMINE (BENADRYL) 25 mg capsule Take 25  mg by mouth every 6 (six) hours as needed for sleep.    Marland Kitchen docusate sodium (COLACE) 100 MG capsule Take 300 mg by mouth daily.    Marland Kitchen escitalopram (LEXAPRO) 10 MG tablet Take 10 mg by mouth daily.    . ferrous sulfate 325 (65 FE) MG tablet Take 325 mg by mouth 2 (two) times daily with a meal. Reported on 10/12/2015    . lidocaine-prilocaine (EMLA) cream Apply 1 application topically as needed. 30 g 2  . ondansetron (ZOFRAN) 8 MG tablet Take 1 tablet (8 mg total) by mouth as directed. Take twice daily as needed for nausea-start on day 3 of chemotherapy 30 tablet 1  . ondansetron (ZOFRAN) 8 MG tablet Take 1 tablet (8 mg total) by mouth 2 (two) times daily as needed for refractory nausea / vomiting. Start on day 3 after chemotherapy. 30 tablet 1  . oxyCODONE (OXY IR/ROXICODONE) 5 MG immediate release tablet Take 1-2 tablets (5-10 mg total) by mouth every 6 (six) hours as needed for severe pain. 150 tablet 0  . potassium chloride SA (K-DUR,KLOR-CON) 20 MEQ tablet Take 1 tab PO TID x 1 day; then take 1 tab PO BID till gone. 14 tablet 1  . prochlorperazine (COMPAZINE) 10 MG tablet Take 1 tablet (10 mg total) by mouth every 8 (eight) hours as needed for nausea or vomiting. 30 tablet 1  . prochlorperazine  (COMPAZINE) 10 MG tablet Take 1 tablet (10 mg total) by mouth every 6 (six) hours as needed (NAUSEA). 30 tablet 1  . traMADol (ULTRAM) 50 MG tablet Take 1-2 tablets (50-100 mg total) by mouth every 6 (six) hours as needed. 60 tablet 0  . urea (CARMOL) 10 % cream Apply topically as needed. 71 g 1  . vitamin B-12 (CYANOCOBALAMIN) 1000 MCG tablet Take 500 mcg by mouth daily. Reported on 09/24/2015    . vitamin C (ASCORBIC ACID) 500 MG tablet Take 500 mg by mouth daily.     No current facility-administered medications for this visit.    Facility-Administered Medications Ordered in Other Visits  Medication Dose Route Frequency Provider Last Rate Last Dose  . clindamycin (CLEOCIN) 900 mg in dextrose 5 % 50 mL IVPB  900 mg Intravenous 60 min Pre-Op Tina Bookbinder, MD       And  . gentamicin (GARAMYCIN) 310 mg in dextrose 5 % 50 mL IVPB  5 mg/kg Intravenous 60 min Pre-Op Tina Bookbinder, MD      . sodium chloride 0.9 % injection 10 mL  10 mL Intracatheter PRN Tina Merle, MD   10 mL at 06/23/16 1139  . sodium chloride flush (NS) 0.9 % injection 10 mL  10 mL Intracatheter PRN Tina Merle, MD   10 mL at 06/04/16 1301    REVIEW OF SYSTEMS:   Constitutional: Denies fevers, chills or abnormal night sweats Eyes: Denies blurriness of vision, double vision or watery eyes Ears, nose, mouth, throat, and face: Denies mucositis or sore throat Respiratory: Denies cough, dyspnea or wheezes Cardiovascular: Denies palpitation, chest discomfort or lower extremity swelling Gastrointestinal:  Denies nausea, heartburn or change in bowel habits Skin: Denies abnormal skin rashes Lymphatics: Denies new lymphadenopathy or easy bruising Neurological:Denies numbness, tingling or new weaknesses Behavioral/Psych: Mood is stable, no new changes  All other systems were reviewed with the patient and are negative.  PHYSICAL EXAMINATION: ECOG PERFORMANCE STATUS: 1 - Symptomatic but completely ambulatory  Vitals:   06/23/16  1202  BP: 113/67  Pulse: (!) 102  Resp: 18  Temp: 98.9 F (37.2 C)   Filed Weights   06/23/16 1202  Weight: 128 lb 11.2 oz (58.4 kg)    GENERAL:alert, no distress and comfortable SKIN: skin color, texture, turgor are normal, no rashes or significant lesions EYES: normal, conjunctiva are pink and non-injected, sclera clear OROPHARYNX:no exudate, no erythema and lips, buccal mucosa, and tongue normal  NECK: supple, thyroid normal size, non-tender, without nodularity LYMPH:  no palpable lymphadenopathy in the cervical, axillary or inguinal   LUNGS: clear to auscultation and percussion with normal breathing effort HEART: regular rate & rhythm and no murmurs and no lower extremity edema ABDOMEN:abdomen soft, non-tender and normal bowel sounds,  Her midline surgical wound has completely healed, and appears to be dry without any discharge.  Musculoskeletal:no cyanosis of digits and no clubbing  PSYCH: alert & oriented x 3 with fluent speech NEURO: no focal motor/sensory deficits  LABORATORY DATA:  I have reviewed the data as listed CBC Latest Ref Rng & Units 06/23/2016 06/02/2016 05/30/2016  WBC 3.9 - 10.3 10e3/uL 5.3 6.1 5.7  Hemoglobin 11.6 - 15.9 g/dL 8.7(L) 10.4(L) 10.4(L)  Hematocrit 34.8 - 46.6 % 27.5(L) 32.4(L) 32.7(L)  Platelets 145 - 400 10e3/uL 277 295 377   CMP Latest Ref Rng & Units 06/23/2016 06/02/2016 04/29/2016  Glucose 70 - 140 mg/dl 93 96 84  BUN 7.0 - 26.0 mg/dL 7.3 7.8 6.3(L)  Creatinine 0.6 - 1.1 mg/dL 0.6 0.6 0.6  Sodium 136 - 145 mEq/L 138 137 140  Potassium 3.5 - 5.1 mEq/L 3.9 4.2 3.9  Chloride 101 - 111 mmol/L - - -  CO2 22 - 29 mEq/L 25 26 24   Calcium 8.4 - 10.4 mg/dL 9.4 9.6 9.2  Total Protein 6.4 - 8.3 g/dL 6.6 7.3 6.9  Total Bilirubin 0.20 - 1.20 mg/dL 0.23 0.25 <0.30  Alkaline Phos 40 - 150 U/L 136 133 145  AST 5 - 34 U/L 13 15 18   ALT 0 - 55 U/L 9 9 14    PATHOLOGY REPORT Diagnosis 08/03/2015 Colon, segmental resection, Right - INVASIVE  ADENOCARCINOMA WITH ABUNDANT EXTRACELLULAR MUCIN, MODERATELY DIFFERENTIATED, LIKELY ARISING FROM ILEOCECAL VALVE. - ADENOCARCINOMA EXTENDS AT LEAST INTO THE PERICOLONIC SOFT TISSUE AND IS ASSOCIATED WITH TRANSMURAL DEFECT. - THE PROXIMAL AND DISTAL RESECTION MARGINS ARE NEGATIVE FOR ADENOCARCINOMA. - ACELLULAR MUCIN IS GROSSLY PRESENT AT THE SEROSAL SURFACE. - LYMPHOVASCULAR INVASION IS IDENTIFIED. - METASTATIC CARCINOMA IN 1 OF 40 LYMPH NODES (1/40). - SEE ONCOLOGY TABLE BELOW. Microscopic Comment COLON AND RECTUM (INCLUDING TRANS-ANAL RESECTION): Specimen: Right colon and terminal ileum. Procedure: Resection. Tumor site: Likely ileocecal valve. Specimen integrity: Transmural defect(s). Macroscopic intactness of mesorectum: N/A Macroscopic tumor perforation: Present. Invasive tumor: Maximum size: At least 2.5 cm. Histologic type(s): Adenocarcinoma with abundant extracellular mucin. Histologic grade and differentiation: G2: moderately differentiated. Type of polyp in which invasive carcinoma arose: Tubular adenoma. Microscopic extension of invasive tumor: Adenocarcinoma extends into pericolonic soft tissue and acellular mucin is grossly present at the serosal surface. Lymph-Vascular invasion: Present. Peri-neural invasion: Not identified. Tumor deposit(s) (discontinuous extramural extension): Not identified. Resection margins: Proximal margin: Negative for carcinoma. Distal margin: Negative for carcinoma. Circumferential (radial) (posterior ascending, posterior descending; lateral and posterior mid-rectum; and entire lower 1/3 rectum): Acellular mucin is present at the circumferential tissue edge. Treatment effect (neo-adjuvant therapy): N/A Additional polyp(s): Not identified. Non-neoplastic findings: No significant findings. Lymph nodes: number examined 40; number positive: 1 Pathologic Staging: at least pT4a, pN1a. Ancillary studies: MMR by IHC and MSI by PCR will be performed  and  the results reported separately. Additional studies can be performed upon clinician request.   ADDITIONAL INFORMATION: Mismatch Repair (MMR) Protein Immunohistochemistry (IHC) IHC Expression Result: MLH1: EQUIVOCAL MSH2: EQUIVOCAL MSH6: EQUIVOCAL PMS2: EQUIVOCAL * Internal control demonstrates intact nuclear expression Interpretation: EQUIVOCAL The tumor shows partial staining with all anibodies and hence the results are considered equivocal. Correlated with molecular based MSI testing is strongly recommended.    RADIOGRAPHIC STUDIES: I have personally reviewed the radiological images as listed and agreed with the findings in the report. Ir US Guide Vasc Access Right  Result Date: 05/30/2016 CLINICAL DATA:  Colon carcinoma with peritoneal carcinomatosis. Port required to begin chemotherapy. EXAM: IMPLANTED PORT A CATH PLACEMENT WITH ULTRASOUND AND FLUOROSCOPIC GUIDANCE ANESTHESIA/SEDATION: 1.5 mg IV Versed; 100 mcg IV Fentanyl Total Moderate Sedation Time:  36 minutes The patient's level of consciousness and physiologic status were continuously monitored during the procedure by Radiology nursing. Additional Medications: 1 g IV vancomycin. Vancomycin was given within two hours of incision. Vancomycin was given due to an antibiotic allergy. FLUOROSCOPY TIME:  24 seconds.  3.0 mGy. PROCEDURE: The procedure, risks, benefits, and alternatives were explained to the patient. Questions regarding the procedure were encouraged and answered. The patient understands and consents to the procedure. A time-out was performed prior to initiating the procedure. Ultrasound was utilized to confirm patency of the right internal jugular vein. The right neck and chest were prepped with chlorhexidine in a sterile fashion, and a sterile drape was applied covering the operative field. Maximum barrier sterile technique with sterile gowns and gloves were used for the procedure. Local anesthesia was provided with 1%  lidocaine. After creating a small venotomy incision, a 21 gauge needle was advanced into the right internal jugular vein under direct, real-time ultrasound guidance. Ultrasound image documentation was performed. After securing guidewire access, an 8 Fr dilator was placed. A J-wire was kinked to measure appropriate catheter length. A subcutaneous port pocket was then created along the upper chest wall utilizing sharp and blunt dissection. Portable cautery was utilized. The pocket was irrigated with sterile saline. A single lumen power injectable port was chosen for placement. The 8 Fr catheter was tunneled from the port pocket site to the venotomy incision. The port was placed in the pocket. External catheter was trimmed to appropriate length based on guidewire measurement. At the venotomy, an 8 Fr peel-away sheath was placed over a guidewire. The catheter was then placed through the sheath and the sheath removed. Final catheter positioning was confirmed and documented with a fluoroscopic spot image. The port was accessed with a needle and aspirated and flushed with heparinized saline. The access needle was removed. The venotomy and port pocket incisions were closed with subcutaneous 3-0 Monocryl and subcuticular 4-0 Vicryl. Dermabond was applied to both incisions. COMPLICATIONS: COMPLICATIONS None FINDINGS: After catheter placement, the tip lies at the cavo-atrial junction. The catheter aspirates normally and is ready for immediate use. IMPRESSION: Placement of single lumen port a cath via right internal jugular vein. The catheter tip lies at the cavo-atrial junction. A power injectable port a cath was placed and is ready for immediate use. Electronically Signed   By: [name] M.D.   On: 05/30/2016 15:24   Ct Biopsy  Result Date: 05/30/2016 CLINICAL DATA:  Cecal carcinoma with peritoneal masses. EXAM: CT GUIDED CORE BIOPSY OF PERITONEAL MASS ANESTHESIA/SEDATION: 2.0 mg IV Versed; 100 mcg IV Fentanyl Total  Moderate Sedation Time:  15 minutes. The patient's level of consciousness and physiologic status were continuously monitored  during the procedure by Radiology nursing. PROCEDURE: The procedure risks, benefits, and alternatives were explained to the patient. Questions regarding the procedure were encouraged and answered. The patient understands and consents to the procedure. A time-out was performed prior to initiating the procedure. The left abdominal wall was prepped with chlorhexidine in a sterile fashion, and a sterile drape was applied covering the operative field. A sterile gown and sterile gloves were used for the procedure. Local anesthesia was provided with 1% Lidocaine. Under CT guidance, a 17 gauge trocar needle was advanced to the level of a left anterior peritoneal mass. After confirming needle tip position, 3 separate coaxial 18 gauge core biopsy samples were obtained. These were submitted in formalin. Additional CT was performed. COMPLICATIONS: None FINDINGS: Nodular peritoneal tumor in the anterior left lower quadrant was targeted located just deep to the abdominal wall. Solid tissue was obtained. There was no evidence of complication by CT. IMPRESSION: CT-guided core biopsy performed of peritoneal mass in the left lower quadrant. Electronically Signed   By: [name] M.D.   On: 05/30/2016 15:22   Ir Fluoro Guide Port Insertion Right  Result Date: 05/30/2016 CLINICAL DATA:  Colon carcinoma with peritoneal carcinomatosis. Port required to begin chemotherapy. EXAM: IMPLANTED PORT A CATH PLACEMENT WITH ULTRASOUND AND FLUOROSCOPIC GUIDANCE ANESTHESIA/SEDATION: 1.5 mg IV Versed; 100 mcg IV Fentanyl Total Moderate Sedation Time:  36 minutes The patient's level of consciousness and physiologic status were continuously monitored during the procedure by Radiology nursing. Additional Medications: 1 g IV vancomycin. Vancomycin was given within two hours of incision. Vancomycin was given due to an  antibiotic allergy. FLUOROSCOPY TIME:  24 seconds.  3.0 mGy. PROCEDURE: The procedure, risks, benefits, and alternatives were explained to the patient. Questions regarding the procedure were encouraged and answered. The patient understands and consents to the procedure. A time-out was performed prior to initiating the procedure. Ultrasound was utilized to confirm patency of the right internal jugular vein. The right neck and chest were prepped with chlorhexidine in a sterile fashion, and a sterile drape was applied covering the operative field. Maximum barrier sterile technique with sterile gowns and gloves were used for the procedure. Local anesthesia was provided with 1% lidocaine. After creating a small venotomy incision, a 21 gauge needle was advanced into the right internal jugular vein under direct, real-time ultrasound guidance. Ultrasound image documentation was performed. After securing guidewire access, an 8 Fr dilator was placed. A J-wire was kinked to measure appropriate catheter length. A subcutaneous port pocket was then created along the upper chest wall utilizing sharp and blunt dissection. Portable cautery was utilized. The pocket was irrigated with sterile saline. A single lumen power injectable port was chosen for placement. The 8 Fr catheter was tunneled from the port pocket site to the venotomy incision. The port was placed in the pocket. External catheter was trimmed to appropriate length based on guidewire measurement. At the venotomy, an 8 Fr peel-away sheath was placed over a guidewire. The catheter was then placed through the sheath and the sheath removed. Final catheter positioning was confirmed and documented with a fluoroscopic spot image. The port was accessed with a needle and aspirated and flushed with heparinized saline. The access needle was removed. The venotomy and port pocket incisions were closed with subcutaneous 3-0 Monocryl and subcuticular 4-0 Vicryl. Dermabond was applied  to both incisions. COMPLICATIONS: COMPLICATIONS None FINDINGS: After catheter placement, the tip lies at the cavo-atrial junction. The catheter aspirates normally and is ready for immediate use.  IMPRESSION: Placement of single lumen port a cath via right internal jugular vein. The catheter tip lies at the cavo-atrial junction. A power injectable port a cath was placed and is ready for immediate use. Electronically Signed   By: [name] M.D.   On: 05/30/2016 15:24    ASSESSMENT & PLAN:  50 year old Caucasian female, with past medical history of fibromyalgia, depression, presented with ruptured cecum and pericolonic abscess, required multiple drainage, antibiotics, and eventually right colectomy.  1. Right colon cancer, cecum, moderately differentiated adenocarcinoma, pT4N1aM0, stage IIIA, with perforation, MSI-stable, peritoneum recurrence - I previously reviewed her scan findings, and surgical pathology results in great details with patient and her Husband.  - she has locally advanced stage III  Disease, with particular high risk features of T4 tumor, proliferation for 3-4 months,  And positive lymph nodes, she is at very high risk for cancer recurrence, especially peritoneal carcinomatosis. -She has completed adjuvant chemotherapy CAPOX, oxaliplatin dose was significantly reduced due to her poor tolerance. 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 No other significant metastasis on the CT scan. We discussed that the metastasis is not definitive based on CT, and needs to be confirmed by biopsy  -I reviewed her PET scan from 05/20/16, which showed hypermetabolic peritoneal carcinomatosis, in a few small lung nodules which are indeterminate. -She underwent a liver biopsy this morning, results still pending, will request [facility] genomic testing  -I recommend her to start chemotherapy with 5-FU and irinotecan, will add  Avast or panitumumab based on FO result --The goal of chemotherapy is palliative -She tolerated the first cycle FOLFIRI well, lab reviewed, adequate for treatment, we'll proceed with second cycle chemotherapy today, with full dose    2. Abdominal pain -Likely related to her cancer recurrence -Improved since she started chemotherapy -Continue oxycodone as needed   3. Anemia, iron deficient anemia  - likely secondary to surgery,  Chronic infection and GI bleeding from the tumor -Iron study showed low serum iron, transferrin saturation and ferritin 12, which is consistent with iron deficiency -Due to her significant worsening anemia, hemoglobin 8.7 today, I recommend IV Feraheme weekly twice for the next 2 weeks -We'll consider blood transfusion if hemoglobin less than 8 or symptomatic anemia with hemoglobin 8-9 -She will continue oral iron  4. Fibromyalgia and depression - she will continue medication, and follow-up with her primary care physician   Plan -second cycle FOLFIRI today, no 5-fu bolus, full dose irinotecan, no neulasta  -will see her back in 2 weeks before next cycle chemo, will add Avastin or panitumumab based on FO result   All questions were answered. The patient knows to call the clinic with any problems, questions or concerns.  I spent 25 minutes counseling the patient face to face. The total time spent in the appointment was 30 minutes and more than 50% was on counseling.     Tina Merle, MD 06/23/2016

## 2016-06-23 NOTE — Progress Notes (Signed)
Okay to run Adrucil pump over 44 hours instead of 46 hours per Dr. Burr Medico.

## 2016-06-23 NOTE — Telephone Encounter (Signed)
Message sent to chemo scheduler to be added. Appointments scheduled per 06/23/16 los. A copy of the AVS report and appointment schedule was given to patient, per 06/23/16 los. °

## 2016-06-24 ENCOUNTER — Telehealth: Payer: Self-pay | Admitting: *Deleted

## 2016-06-24 NOTE — Telephone Encounter (Signed)
Per LOS I have scheduled appts and notified the scheduler 

## 2016-06-25 ENCOUNTER — Ambulatory Visit (HOSPITAL_BASED_OUTPATIENT_CLINIC_OR_DEPARTMENT_OTHER): Payer: Managed Care, Other (non HMO)

## 2016-06-25 VITALS — BP 124/76 | HR 106 | Temp 98.4°F | Resp 18

## 2016-06-25 DIAGNOSIS — C182 Malignant neoplasm of ascending colon: Secondary | ICD-10-CM | POA: Diagnosis not present

## 2016-06-25 DIAGNOSIS — C786 Secondary malignant neoplasm of retroperitoneum and peritoneum: Secondary | ICD-10-CM | POA: Diagnosis not present

## 2016-06-25 DIAGNOSIS — C180 Malignant neoplasm of cecum: Secondary | ICD-10-CM

## 2016-06-25 MED ORDER — SODIUM CHLORIDE 0.9% FLUSH
10.0000 mL | INTRAVENOUS | Status: DC | PRN
  Administered 2016-06-25: 10 mL
  Filled 2016-06-25: qty 10

## 2016-06-25 MED ORDER — HEPARIN SOD (PORK) LOCK FLUSH 100 UNIT/ML IV SOLN
500.0000 [IU] | Freq: Once | INTRAVENOUS | Status: AC | PRN
  Administered 2016-06-25: 500 [IU]
  Filled 2016-06-25: qty 5

## 2016-06-28 ENCOUNTER — Encounter (HOSPITAL_COMMUNITY): Payer: Self-pay

## 2016-06-29 ENCOUNTER — Ambulatory Visit: Payer: Managed Care, Other (non HMO)

## 2016-07-05 NOTE — Progress Notes (Signed)
[name]  [phone] [phone]) [phone]  Clinic follow up Note   Patient Care Team: Carol Ada, MD as PCP - General (Family Medicine) Rolm Bookbinder, MD as Consulting Physician (General Surgery) 07/07/2016   CHIEF COMPLAINTS:  Follow up metastatic colon cancer   Oncology History   Cancer of right colon [facility])   Staging form: Colon and Rectum, AJCC 7th Edition     Pathologic stage from 08/03/2015: Stage IIIB (T4a, N1a, cM0) - Signed by Truitt Merle, MD on 09/04/2015       Cancer of right colon ([place])   03/31/2015 Imaging    CT abdomen showed appendicitis, perforated, with periappendiceal abscess. And small pulmonary nodules.      04/03/2015 Procedure    CT guided placement of going Through into the right lower abdominal quadrant with aspiration of a total of 30 mL prudent fluid.       08/03/2015 Initial Diagnosis    Cancer of right colon ([place])      08/03/2015 Surgery    Right hemicolectomy      08/03/2015 Pathology Results    Right hemicolectomy showed invasive adenocarcinoma, moderately differentiated, likely arising from a cecal valve. One out of 40 lymph nodes were positive, (+) LVI, margins negative       08/03/2015 Miscellaneous    colon cance MSI-stable       09/18/2015 - 03/31/2016 Chemotherapy    Adjuvant CAPOX, she tolerated oxaliplatin poorly, which was held from cycle 2 to 4 due to poor tolerance, restarted from cycle 5 with lower dose, she also had significant hand-foot syndrome from Xeloda. She completed a total of 8 cycles.      05/20/2016 Imaging    PET scan revealed hypermetabolic peritoneal carcinomatosis, five scatted pulmonary nodules in both lungs, largest 4 mm, metastasis not excluded. Hypermetabolic mass in the deep subcutaneous ventral right lateral pelvic wall, probable metastasis.      06/02/2016 -  Chemotherapy    FOLFIRI every 2 weeks, started on 06/02/2016       06/26/2016 Miscellaneous    Foundation One results  received       HISTORY OF PRESENTING ILLNESS:  [name] 50 y.o. female without significant PMH is here because of her recently diagnosed stage III colon cancer. She is accompanied by her boyfriend to the clinic today.  She developed fever and headache in September 2016, was found to have ruptured appendix and was admitted to hospital. CT scan revealed a peri-appendiceal abscess. She underwent abscess drainage tube basement by interventional radiology on 04/17/2015, and a course of antibiotics. She had a multiple drainage tube exchange by IR, but the abscess did not heal. She was referred to Dr. Donne [name]. Multiple CT scan in October and November showed persistent peri-appendiceal fluid collection. She was finally taken to the operation room on 08/03/2015. Intraoperatively, she was found to have a 2 cm whole present in the cecum, she underwent ileocectomy with an anastomosis in the hepatic fixture. 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  She had wound infection issue after surgery. It has been healing slowly, she is still on wet-to-dry dressing change twice daily, has home care nurse. She has moderate pain, 6-7/10, sometime postional, she is on oxycodone 2-3 times a day. Her appetite and eating is getting better, lost about 25lb since 03/2015, no fever and chills for the past 3-4 days, just finished abx yesterday.   CURRENT THERAPY: chemotherapy FOLFIRI every 2 weeks starting on 06/02/2016, Avastin added on  cycle 3   INTERIM HISTORY:  [name] returns for follow-up and third cycle chemo. She states the last cycle wasn't "too bad." Takes magnesium citrate for constipation. States it helps. Limited nausea with 1 occurrence. Some fatigue, able to continue working and functions well.   MEDICAL HISTORY:  Past Medical History:  Diagnosis Date  . Anemia   . Anxiety   . Depression   . Fibromyalgia   . GERD (gastroesophageal reflux disease)   . JP drain bleeding      SURGICAL HISTORY: Past Surgical History:  Procedure Laterality Date  . ABDOMINAL HYSTERECTOMY    . COLON SURGERY  08/03/2015  . COLOSTOMY REVISION N/A 08/03/2015   Procedure: COLON RESECTION RIGHT;  Surgeon: Rolm Bookbinder, MD;  Location: [place];  Service: General;  Laterality: N/A;  . GASTRIC BYPASS    . IR GENERIC HISTORICAL  05/30/2016   IR FLUORO GUIDE PORT INSERTION RIGHT 05/30/2016 Aletta Edouard, MD [facility]-INTERV RAD  . IR GENERIC HISTORICAL  05/30/2016   IR US GUIDE VASC ACCESS RIGHT 05/30/2016 Aletta Edouard, MD [facility]INTER[facility]  . LAPAROSCOPIC ILEOCECECTOMY Right 08/03/2015   Procedure: LAPAROSCOPIC DIAGNOSTIC RIGHT COLECTOMY;  Surgeon: Rolm Bookbinder, MD;  Location: [place];  Service: General;  Laterality: Right;    SOCIAL HISTORY: Social History   Social History  . Marital status: Married    Spouse name: N/A  . Number of children: N/A  . Years of education: N/A   Occupational History  . Not on file.   Social History Main Topics  . Smoking status: Never Smoker  . Smokeless tobacco: Never Used  . Alcohol use 1.2 oz/week    2 Glasses of wine per week     Comment: moderate 1-2 times a month   . Drug use: No  . Sexual activity: Not on file   Other Topics Concern  . Not on file   Social History Narrative   Single, fiance [name]   Works [employer] 11:30am to 8 pm    FAMILY HISTORY: Family History  Problem Relation Age of Onset  . Cancer Father 45    small cell lung cancer and colon cancer   . Cancer Sister 50    small cell lung cancer  . Cancer Maternal Aunt     ovarian cancer  . Cancer Paternal Uncle     brain cancer   . Cancer Other 40    ovarian cancer (maternal niece)    ALLERGIES:  is allergic to latex; cymbalta [duloxetine hcl]; and penicillins.  MEDICATIONS:  Current Outpatient Prescriptions  Medication Sig Dispense Refill  . acetaminophen (TYLENOL) 500 MG tablet Take 1,000 mg by mouth every 6 (six) hours as needed for fever.  Reported on 09/24/2015    . ALPRAZolam (XANAX) 0.25 MG tablet Take 1-2 tablets (0.25-0.5 mg total) by mouth at bedtime as needed for anxiety or sleep. 60 tablet 0  . bisacodyl (DULCOLAX) 5 MG EC tablet Take 10 mg by mouth daily as needed for moderate constipation.    . cyclobenzaprine (FLEXERIL) 10 MG tablet Take 1 tablet (10 mg total) by mouth 3 (three) times daily as needed for muscle spasms. 60 tablet 0  . dexamethasone (DECADRON) 4 MG tablet Take 2 tablets (8 mg total) by mouth daily. Start day after chemo and take for 3 days after each IV chemo 30 tablet 1  . diphenhydrAMINE (BENADRYL) 25 mg capsule Take 25 mg by mouth every 6 (six) hours as needed for sleep.    Marland Kitchen  docusate sodium (COLACE) 100 MG capsule Take 300 mg by mouth daily.    Marland Kitchen escitalopram (LEXAPRO) 10 MG tablet Take 10 mg by mouth daily.    Marland Kitchen lidocaine-prilocaine (EMLA) cream Apply 1 application topically as needed. 30 g 2  . ondansetron (ZOFRAN) 8 MG tablet Take 1 tablet (8 mg total) by mouth 2 (two) times daily as needed for refractory nausea / vomiting. Start on day 3 after chemotherapy. 30 tablet 1  . oxyCODONE (OXY IR/ROXICODONE) 5 MG immediate release tablet Take 1-2 tablets (5-10 mg total) by mouth every 6 (six) hours as needed for severe pain. 150 tablet 0  . prochlorperazine (COMPAZINE) 10 MG tablet Take 1 tablet (10 mg total) by mouth every 8 (eight) hours as needed for nausea or vomiting. 30 tablet 1  . prochlorperazine (COMPAZINE) 10 MG tablet Take 1 tablet (10 mg total) by mouth every 6 (six) hours as needed (NAUSEA). 30 tablet 1  . traMADol (ULTRAM) 50 MG tablet Take 1-2 tablets (50-100 mg total) by mouth every 6 (six) hours as needed. 60 tablet 0  . urea (CARMOL) 10 % cream Apply topically as needed. 71 g 1  . vitamin B-12 (CYANOCOBALAMIN) 1000 MCG tablet Take 500 mcg by mouth daily. Reported on 09/24/2015    . vitamin C (ASCORBIC ACID) 500 MG tablet Take 500 mg by mouth daily.    . ferrous sulfate 325 (65 FE) MG tablet  Take 325 mg by mouth 2 (two) times daily with a meal. Reported on 10/12/2015     No current facility-administered medications for this visit.    Facility-Administered Medications Ordered in Other Visits  Medication Dose Route Frequency Provider Last Rate Last Dose  . clindamycin (CLEOCIN) 900 mg in dextrose 5 % 50 mL IVPB  900 mg Intravenous 60 min Pre-Op Rolm Bookbinder, MD       And  . gentamicin (GARAMYCIN) 310 mg in dextrose 5 % 50 mL IVPB  5 mg/kg Intravenous 60 min Pre-Op Rolm Bookbinder, MD      . sodium chloride 0.9 % injection 10 mL  10 mL Intracatheter PRN Truitt Merle, MD   10 mL at 06/23/16 1139  . sodium chloride flush (NS) 0.9 % injection 10 mL  10 mL Intracatheter PRN Truitt Merle, MD   10 mL at 06/04/16 1301    REVIEW OF SYSTEMS:  Constitutional: Denies fevers, chills or abnormal night sweats. (+) fatigue Eyes: Denies blurriness of vision, double vision or watery eyes Ears, nose, mouth, throat, and face: Denies mucositis or sore throat Respiratory: Denies cough, dyspnea or wheezes Cardiovascular: Denies palpitation, chest discomfort or lower extremity swelling Gastrointestinal:  Denies heartburn. (+) nausea, constipation. Skin: Denies abnormal skin rashes Lymphatics: Denies new lymphadenopathy or easy bruising Neurological:Denies numbness, tingling or new weaknesses Behavioral/Psych: Mood is stable, no new changes  All other systems were reviewed with the patient and are negative.  PHYSICAL EXAMINATION: ECOG PERFORMANCE STATUS: 1 - Symptomatic but completely ambulatory  Vitals:   07/07/16 1042  BP: 109/70  Pulse: 93  Resp: 18  Temp: 98.5 F (36.9 C)   Filed Weights   07/07/16 1042  Weight: 127 lb 8 oz (57.8 kg)    GENERAL:alert, no distress and comfortable SKIN: skin color, texture, turgor are normal, no rashes or significant lesions EYES: normal, conjunctiva are pink and non-injected, sclera clear OROPHARYNX:no exudate, no erythema and lips, buccal mucosa, and  tongue normal  NECK: supple, thyroid normal size, non-tender, without nodularity LYMPH:  no palpable lymphadenopathy in the  cervical, axillary or inguinal   LUNGS: clear to auscultation and percussion with normal breathing effort HEART: regular rate & rhythm and no murmurs and no lower extremity edema ABDOMEN:abdomen soft, normal bowel sounds,  Large midline surgical wound has completely healed, and appears to be dry without any discharge. 2 x 3 cm palpable mass in the right upper quadrant of the abdomen. A little tender. Musculoskeletal:no cyanosis of digits and no clubbing  PSYCH: alert & oriented x 3 with fluent speech NEURO: no focal motor/sensory deficits  LABORATORY DATA:  I have reviewed the data as listed CBC Latest Ref Rng & Units 07/07/2016 06/23/2016 06/02/2016  WBC 3.9 - 10.3 10e3/uL 4.2 5.3 6.1  Hemoglobin 11.6 - 15.9 g/dL 8.7(L) 8.7(L) 10.4(L)  Hematocrit 34.8 - 46.6 % 26.8(L) 27.5(L) 32.4(L)  Platelets 145 - 400 10e3/uL 401(H) 277 295   CMP Latest Ref Rng & Units 07/07/2016 06/23/2016 06/02/2016  Glucose 70 - 140 mg/dl 91 93 96  BUN 7.0 - 26.0 mg/dL 6.2(L) 7.3 7.8  Creatinine 0.6 - 1.1 mg/dL 0.6 0.6 0.6  Sodium 136 - 145 mEq/L 138 138 137  Potassium 3.5 - 5.1 mEq/L 4.1 3.9 4.2  Chloride 101 - 111 mmol/L - - -  CO2 22 - 29 mEq/L _0 Calcium 8.4 - 10.4 mg/dL 9.7 9.4 9.6  Total Protein 6.4 - 8.3 g/dL 6.8 6.6 7.3  Total Bilirubin 0.20 - 1.20 mg/dL 0.22 0.23 0.25  Alkaline Phos 40 - 150 U/L 136 136 133  AST 5 - 34 U/L _1 ALT 0 - 55 U/L _2 Results for KENDALL, ARNELL (MRN [id number]) as of 07/07/2016 11:08  Ref. Range 11/02/2015 08:44 12/18/2015 08:39 02/04/2016 15:05 03/18/2016 12:47 06/02/2016 12:07  CEA Latest Ref Range: 0.0 - 4.7 ng/mL 1.7 2.1 2.5 8.8 (H) 25.9 (H)  CEA ([facility]) Latest Ref Range: 0.00 - 5.00 ng/mL    6.84 (H) 21.73 (H)     PATHOLOGY REPORT  05/30/2016 Diagnosis Peritoneum, biopsy, left lower quadrant mesentery ADENOCARCINOMA  WITH ABUNDANT EXTRACELLULAR MUCIN CONSISTENT WITH COLONIC PRIMARY   FOUNDATION ONE [id number] 05/30/2016   Diagnosis 08/03/2015 Colon, segmental resection, Right - INVASIVE ADENOCARCINOMA WITH ABUNDANT EXTRACELLULAR MUCIN, MODERATELY DIFFERENTIATED, LIKELY ARISING FROM ILEOCECAL VALVE. - ADENOCARCINOMA EXTENDS AT LEAST INTO THE PERICOLONIC SOFT TISSUE AND IS ASSOCIATED WITH TRANSMURAL DEFECT. - THE PROXIMAL AND DISTAL RESECTION MARGINS ARE NEGATIVE FOR ADENOCARCINOMA. - ACELLULAR MUCIN IS GROSSLY PRESENT AT THE SEROSAL SURFACE. - LYMPHOVASCULAR INVASION IS IDENTIFIED. - METASTATIC CARCINOMA IN 1 OF 40 LYMPH NODES (1/40). - SEE ONCOLOGY TABLE BELOW. Microscopic Comment COLON AND RECTUM (INCLUDING TRANS-ANAL RESECTION): Specimen: Right colon and terminal ileum. Procedure: Resection. Tumor site: Likely ileocecal valve. Specimen integrity: Transmural defect(s). Macroscopic intactness of mesorectum: N/A Macroscopic tumor perforation: Present. Invasive tumor: Maximum size: At least 2.5 cm. Histologic type(s): Adenocarcinoma with abundant extracellular mucin. Histologic grade and differentiation: G2: moderately differentiated. Type of polyp in which invasive carcinoma arose: Tubular adenoma. Microscopic extension of invasive tumor: Adenocarcinoma extends into pericolonic soft tissue and acellular mucin is grossly present at the serosal surface. Lymph-Vascular invasion: Present. Peri-neural invasion: Not identified. Tumor deposit(s) (discontinuous extramural extension): Not identified. Resection margins: Proximal margin: Negative for carcinoma. Distal margin: Negative for carcinoma. Circumferential (radial) (posterior ascending, posterior descending; lateral and posterior mid-rectum; and entire lower 1/3 rectum): Acellular mucin is present at the circumferential tissue edge. Treatment effect (neo-adjuvant therapy): N/A Additional polyp(s): Not identified. Non-neoplastic findings: No  significant findings. Lymph nodes: number  examined 40; number positive: 1 Pathologic Staging: at least pT4a, pN1a. Ancillary studies: MMR by IHC and MSI by PCR will be performed and the results reported separately. Additional studies can be performed upon clinician request.   ADDITIONAL INFORMATION: Mismatch Repair (MMR) Protein Immunohistochemistry (IHC) IHC Expression Result: MLH1: EQUIVOCAL MSH2: EQUIVOCAL MSH6: EQUIVOCAL PMS2: EQUIVOCAL * Internal control demonstrates intact nuclear expression Interpretation: EQUIVOCAL The tumor shows partial staining with all anibodies and hence the results are considered equivocal. Correlated with molecular based MSI testing is strongly recommended.    RADIOGRAPHIC STUDIES: I have personally reviewed the radiological images as listed and agreed with the findings in the report. No results found.  ASSESSMENT & PLAN:  50 y.o. Caucasian female, with past medical history of fibromyalgia, depression, presented with ruptured cecum and pericolonic abscess, required multiple drainage, antibiotics, and eventually right colectomy.  1. Right colon cancer, cecum, moderately differentiated adenocarcinoma, pT4N1aM0, stage IIIA, with perforation, MSI-stable, KRAS mutation (+), peritoneum recurrence - I previously reviewed her scan findings, and surgical pathology results in great details with patient and her Husband.  - she has locally advanced stage III  Disease, with particular high risk features of T4 tumor, proliferation for 3-4 months,  And positive lymph nodes, she is at very high risk for cancer recurrence, especially peritoneal carcinomatosis. -She has completed adjuvant chemotherapy CAPOX, oxaliplatin dose was significantly reduced due to her poor tolerance. -I previously discussed her surveillance CT scan from 04/29/2016, which unfortunately showed probable peritoneal carcinomatosis. No other significant metastasis on the CT scan. We discussed that  the metastasis is not definitive based on CT, and needs to be confirmed by biopsy  -I previously reviewed her PET scan from 05/20/16, which showed hypermetabolic peritoneal carcinomatosis, in a few small lung nodules which are indeterminate. -She has started chemotherapy for fear he, tolerating well, and her abdominal pain has improved. -She underwent a liver biopsy on 05/30/16 of the left lower quadrant mesentery revealing adenocarcinoma with abundant extracellular mucin consistent with colonic primary. Foundation One genomic testing revealed KRAS mutation, MSI-stable, she is not a candidate for immunotherapy alone, no benefit from EGFR inhibitor. -based on the FoundationOne result, I recommend adding Avastin to her chemotherapy. Potential benefits, and side effects, especially hypertension, proteinuria, risk of bleeding and thrombosis, bowel perforation, we'll discuss with her, she agrees to proceed. We'll add on from next cycle. --The goal of chemotherapy is palliative. -She tolerated the second cycle FOLFIRI well, lab reviewed, adequate for treatment, we'll proceed with third cycle chemotherapy today, with full dose.  -Check CEA once a month.  -I plan to repeat staging CT scan after 5-6 cycles of chemotherapy  2. Abdominal pain -Likely related to her cancer recurrence -Improved since she started chemotherapy -Continue oxycodone as needed   3. Anemia, iron deficient anemia  - likely secondary to surgery,  Chronic infection and GI bleeding from the tumor -Iron study showed low serum iron, transferrin saturation and ferritin 12, which is consistent with iron deficiency -Due to her significant worsening anemia, hemoglobin 8.7 on 06/23/16, I recommendeded IV Feraheme weekly twice for the next 2 weeks. The patient was not contacted with the IV Feraheme schedule and we will reschedule this. -We'll consider blood transfusion if hemoglobin less than 8 or symptomatic anemia with hemoglobin 8-9 -She will  continue oral iron  4. Fibromyalgia and depression - she will continue medication, and follow-up with her primary care physician   Plan -Third cycle FOLFIRI today, no 5-fu bolus, full dose irinotecan, no neulasta -Add  Avastin to next cycle chemo  -IV Feraheme today and in 2 weeks. -Cycle 4 on 07/21/16 with labs. -will see her back in 4 weeks before the 5th cycle of chemo, will order staging CT on her next f/u visit   All questions were answered. The patient knows to call the clinic with any problems, questions or concerns.  I spent 25 minutes counseling the patient face to face. The total time spent in the appointment was 30 minutes and more than 50% was on counseling.     Truitt Merle, MD 07/07/2016   This document serves as a record of services personally performed by Truitt Merle, MD. It was created on her behalf by Darcus Austin, a trained medical scribe. The creation of this record is based on the scribe's personal observations and the provider's statements to them. This document has been checked and approved by the attending provider.

## 2016-07-07 ENCOUNTER — Ambulatory Visit (HOSPITAL_BASED_OUTPATIENT_CLINIC_OR_DEPARTMENT_OTHER): Payer: Managed Care, Other (non HMO)

## 2016-07-07 ENCOUNTER — Other Ambulatory Visit (HOSPITAL_BASED_OUTPATIENT_CLINIC_OR_DEPARTMENT_OTHER): Payer: Managed Care, Other (non HMO)

## 2016-07-07 ENCOUNTER — Ambulatory Visit (HOSPITAL_BASED_OUTPATIENT_CLINIC_OR_DEPARTMENT_OTHER): Payer: Managed Care, Other (non HMO) | Admitting: Hematology

## 2016-07-07 ENCOUNTER — Ambulatory Visit: Payer: Managed Care, Other (non HMO)

## 2016-07-07 ENCOUNTER — Encounter: Payer: Self-pay | Admitting: Hematology

## 2016-07-07 ENCOUNTER — Telehealth: Payer: Self-pay | Admitting: Hematology

## 2016-07-07 VITALS — BP 109/70 | HR 93 | Temp 98.5°F | Resp 18 | Ht 61.0 in | Wt 127.5 lb

## 2016-07-07 DIAGNOSIS — F329 Major depressive disorder, single episode, unspecified: Secondary | ICD-10-CM

## 2016-07-07 DIAGNOSIS — M791 Myalgia: Secondary | ICD-10-CM

## 2016-07-07 DIAGNOSIS — D509 Iron deficiency anemia, unspecified: Secondary | ICD-10-CM

## 2016-07-07 DIAGNOSIS — D500 Iron deficiency anemia secondary to blood loss (chronic): Secondary | ICD-10-CM

## 2016-07-07 DIAGNOSIS — R911 Solitary pulmonary nodule: Secondary | ICD-10-CM

## 2016-07-07 DIAGNOSIS — D649 Anemia, unspecified: Secondary | ICD-10-CM

## 2016-07-07 DIAGNOSIS — R109 Unspecified abdominal pain: Secondary | ICD-10-CM

## 2016-07-07 DIAGNOSIS — C182 Malignant neoplasm of ascending colon: Secondary | ICD-10-CM

## 2016-07-07 DIAGNOSIS — C180 Malignant neoplasm of cecum: Secondary | ICD-10-CM | POA: Diagnosis not present

## 2016-07-07 DIAGNOSIS — C189 Malignant neoplasm of colon, unspecified: Secondary | ICD-10-CM | POA: Diagnosis not present

## 2016-07-07 DIAGNOSIS — Z5111 Encounter for antineoplastic chemotherapy: Secondary | ICD-10-CM | POA: Diagnosis not present

## 2016-07-07 DIAGNOSIS — C786 Secondary malignant neoplasm of retroperitoneum and peritoneum: Secondary | ICD-10-CM

## 2016-07-07 LAB — CBC WITH DIFFERENTIAL/PLATELET
BASO%: 1.4 % (ref 0.0–2.0)
BASOS ABS: 0.1 10*3/uL (ref 0.0–0.1)
EOS%: 4.9 % (ref 0.0–7.0)
Eosinophils Absolute: 0.2 10*3/uL (ref 0.0–0.5)
HEMATOCRIT: 26.8 % — AB (ref 34.8–46.6)
HEMOGLOBIN: 8.7 g/dL — AB (ref 11.6–15.9)
LYMPH#: 1.2 10*3/uL (ref 0.9–3.3)
LYMPH%: 27.9 % (ref 14.0–49.7)
MCH: 24.8 pg — ABNORMAL LOW (ref 25.1–34.0)
MCHC: 32.4 g/dL (ref 31.5–36.0)
MCV: 76.6 fL — ABNORMAL LOW (ref 79.5–101.0)
MONO#: 0.4 10*3/uL (ref 0.1–0.9)
MONO%: 9.9 % (ref 0.0–14.0)
NEUT#: 2.4 10*3/uL (ref 1.5–6.5)
NEUT%: 55.9 % (ref 38.4–76.8)
PLATELETS: 401 10*3/uL — AB (ref 145–400)
RBC: 3.5 10*6/uL — ABNORMAL LOW (ref 3.70–5.45)
RDW: 14.4 % (ref 11.2–14.5)
WBC: 4.2 10*3/uL (ref 3.9–10.3)

## 2016-07-07 LAB — COMPREHENSIVE METABOLIC PANEL
ALT: 11 U/L (ref 0–55)
ANION GAP: 9 meq/L (ref 3–11)
AST: 14 U/L (ref 5–34)
Albumin: 2.9 g/dL — ABNORMAL LOW (ref 3.5–5.0)
Alkaline Phosphatase: 136 U/L (ref 40–150)
BUN: 6.2 mg/dL — ABNORMAL LOW (ref 7.0–26.0)
CO2: 24 meq/L (ref 22–29)
Calcium: 9.7 mg/dL (ref 8.4–10.4)
Chloride: 104 mEq/L (ref 98–109)
Creatinine: 0.6 mg/dL (ref 0.6–1.1)
GLUCOSE: 91 mg/dL (ref 70–140)
POTASSIUM: 4.1 meq/L (ref 3.5–5.1)
SODIUM: 138 meq/L (ref 136–145)
Total Bilirubin: 0.22 mg/dL (ref 0.20–1.20)
Total Protein: 6.8 g/dL (ref 6.4–8.3)

## 2016-07-07 LAB — UA PROTEIN, DIPSTICK - CHCC: PROTEIN: NEGATIVE mg/dL

## 2016-07-07 MED ORDER — DEXAMETHASONE SODIUM PHOSPHATE 10 MG/ML IJ SOLN
INTRAMUSCULAR | Status: AC
  Filled 2016-07-07: qty 1

## 2016-07-07 MED ORDER — SODIUM CHLORIDE 0.9 % IV SOLN
2400.0000 mg/m2 | INTRAVENOUS | Status: DC
  Administered 2016-07-07: 3850 mg via INTRAVENOUS
  Filled 2016-07-07: qty 77

## 2016-07-07 MED ORDER — DEXAMETHASONE SODIUM PHOSPHATE 10 MG/ML IJ SOLN
10.0000 mg | Freq: Once | INTRAMUSCULAR | Status: AC
  Administered 2016-07-07: 10 mg via INTRAVENOUS

## 2016-07-07 MED ORDER — ATROPINE SULFATE 1 MG/ML IJ SOLN
0.5000 mg | Freq: Once | INTRAMUSCULAR | Status: AC | PRN
  Administered 2016-07-07: 0.5 mg via INTRAVENOUS

## 2016-07-07 MED ORDER — PALONOSETRON HCL INJECTION 0.25 MG/5ML
INTRAVENOUS | Status: AC
  Filled 2016-07-07: qty 5

## 2016-07-07 MED ORDER — SODIUM CHLORIDE 0.9 % IV SOLN
Freq: Once | INTRAVENOUS | Status: AC
  Administered 2016-07-07: 12:00:00 via INTRAVENOUS

## 2016-07-07 MED ORDER — SODIUM CHLORIDE 0.9 % IV SOLN: 5.0000 mg/kg | Freq: Once | INTRAVENOUS | Status: DC

## 2016-07-07 MED ORDER — PALONOSETRON HCL INJECTION 0.25 MG/5ML
0.2500 mg | Freq: Once | INTRAVENOUS | Status: AC
  Administered 2016-07-07: 0.25 mg via INTRAVENOUS

## 2016-07-07 MED ORDER — LEUCOVORIN CALCIUM INJECTION 350 MG
400.0000 mg/m2 | Freq: Once | INTRAMUSCULAR | Status: AC
  Administered 2016-07-07: 640 mg via INTRAVENOUS
  Filled 2016-07-07: qty 32

## 2016-07-07 MED ORDER — DEXTROSE 5 % IV SOLN
182.0000 mg/m2 | Freq: Once | INTRAVENOUS | Status: AC
  Administered 2016-07-07: 300 mg via INTRAVENOUS
  Filled 2016-07-07: qty 15

## 2016-07-07 MED ORDER — SODIUM CHLORIDE 0.9 % IV SOLN
510.0000 mg | Freq: Once | INTRAVENOUS | Status: AC
  Administered 2016-07-07: 510 mg via INTRAVENOUS
  Filled 2016-07-07: qty 17

## 2016-07-07 MED ORDER — ATROPINE SULFATE 1 MG/ML IJ SOLN
INTRAMUSCULAR | Status: AC
  Filled 2016-07-07: qty 1

## 2016-07-07 NOTE — Addendum Note (Signed)
Addended by: Tora Kindred on: 07/07/2016 04:08 PM   Modules accepted: Orders

## 2016-07-07 NOTE — Patient Instructions (Signed)
[facility] Discharge Instructions for Patients Receiving Chemotherapy  Today you received the following chemotherapy agents: Irinotecan, Leucovorin, Adrucil, and Avastin. You also got a feraheme infusion.  To help prevent nausea and vomiting after your treatment, we encourage you to take your nausea medication as directed.   If you develop nausea and vomiting that is not controlled by your nausea medication, call the clinic.   BELOW ARE SYMPTOMS THAT SHOULD BE REPORTED IMMEDIATELY:  *FEVER GREATER THAN 100.5 F  *CHILLS WITH OR WITHOUT FEVER  NAUSEA AND VOMITING THAT IS NOT CONTROLLED WITH YOUR NAUSEA MEDICATION  *UNUSUAL SHORTNESS OF BREATH  *UNUSUAL BRUISING OR BLEEDING  TENDERNESS IN MOUTH AND THROAT WITH OR WITHOUT PRESENCE OF ULCERS  *URINARY PROBLEMS  *BOWEL PROBLEMS  UNUSUAL RASH Items with * indicate a potential emergency and should be followed up as soon as possible.  Feel free to call the clinic you have any questions or concerns. The clinic phone number is [phone].  Please show the [facility] at check-in to the Emergency Department and triage nurse.

## 2016-07-07 NOTE — Progress Notes (Signed)
Avastin was not given due to prior auth issues. Per pharmacy, this will be given with her next cycle of chemo.

## 2016-07-07 NOTE — Telephone Encounter (Signed)
Appointments scheduled per 12/14 LOS. Patient had to leave for infusion appointment before finishing with scheduling. Patient plans to pick up appointments and report in infusion room.

## 2016-07-08 ENCOUNTER — Telehealth: Payer: Self-pay | Admitting: *Deleted

## 2016-07-08 ENCOUNTER — Telehealth: Payer: Self-pay | Admitting: Hematology

## 2016-07-08 NOTE — Telephone Encounter (Signed)
Per LOS I have scheduled appts ans notified the scheduler 

## 2016-07-08 NOTE — Telephone Encounter (Signed)
Called patient to notify her of schedule changes to her 1/11 and 1/25 appointments. Changes were made to accommodate infusion appointments. Patient aware and satisfied with dates/  times.

## 2016-07-09 ENCOUNTER — Ambulatory Visit (HOSPITAL_BASED_OUTPATIENT_CLINIC_OR_DEPARTMENT_OTHER): Payer: Managed Care, Other (non HMO)

## 2016-07-09 VITALS — BP 109/71 | HR 92 | Temp 98.9°F | Resp 18

## 2016-07-09 DIAGNOSIS — C182 Malignant neoplasm of ascending colon: Secondary | ICD-10-CM

## 2016-07-09 DIAGNOSIS — Z452 Encounter for adjustment and management of vascular access device: Secondary | ICD-10-CM

## 2016-07-09 MED ORDER — SODIUM CHLORIDE 0.9% FLUSH
10.0000 mL | INTRAVENOUS | Status: DC | PRN
  Administered 2016-07-09: 10 mL
  Filled 2016-07-09: qty 10

## 2016-07-09 MED ORDER — HEPARIN SOD (PORK) LOCK FLUSH 100 UNIT/ML IV SOLN
500.0000 [IU] | Freq: Once | INTRAVENOUS | Status: AC | PRN
  Administered 2016-07-09: 500 [IU]
  Filled 2016-07-09: qty 5

## 2016-07-13 ENCOUNTER — Other Ambulatory Visit: Payer: Self-pay | Admitting: Hematology

## 2016-07-13 DIAGNOSIS — C182 Malignant neoplasm of ascending colon: Secondary | ICD-10-CM

## 2016-07-14 ENCOUNTER — Other Ambulatory Visit: Payer: Self-pay | Admitting: Hematology

## 2016-07-14 DIAGNOSIS — C182 Malignant neoplasm of ascending colon: Secondary | ICD-10-CM

## 2016-07-14 NOTE — Telephone Encounter (Signed)
TC from patient requesting refills on her oxycodone and her xanax. Both were last filled on 06/02/16.  Xanax was for 60 tabs; Oxycodone was for 150 tabs  Please call patient when prescriptions are available for pick up. Her fiance' will be picking them up.

## 2016-07-15 ENCOUNTER — Other Ambulatory Visit: Payer: Self-pay | Admitting: *Deleted

## 2016-07-15 DIAGNOSIS — C182 Malignant neoplasm of ascending colon: Secondary | ICD-10-CM

## 2016-07-15 MED ORDER — OXYCODONE HCL 5 MG PO TABS: 5.0000 mg | ORAL_TABLET | Freq: Four times a day (QID) | ORAL | 0 refills | Status: DC | PRN

## 2016-07-15 MED ORDER — ALPRAZOLAM 0.25 MG PO TABS: 0.2500 mg | ORAL_TABLET | Freq: Every evening | ORAL | 0 refills | Status: DC | PRN

## 2016-07-20 ENCOUNTER — Ambulatory Visit: Payer: Managed Care, Other (non HMO) | Admitting: Hematology

## 2016-07-21 ENCOUNTER — Other Ambulatory Visit (HOSPITAL_BASED_OUTPATIENT_CLINIC_OR_DEPARTMENT_OTHER): Payer: Managed Care, Other (non HMO)

## 2016-07-21 ENCOUNTER — Ambulatory Visit: Payer: Managed Care, Other (non HMO)

## 2016-07-21 ENCOUNTER — Ambulatory Visit (HOSPITAL_BASED_OUTPATIENT_CLINIC_OR_DEPARTMENT_OTHER): Payer: Managed Care, Other (non HMO)

## 2016-07-21 ENCOUNTER — Other Ambulatory Visit: Payer: Self-pay | Admitting: Hematology

## 2016-07-21 VITALS — BP 102/69 | HR 92 | Temp 98.2°F | Resp 18

## 2016-07-21 DIAGNOSIS — Z5112 Encounter for antineoplastic immunotherapy: Secondary | ICD-10-CM

## 2016-07-21 DIAGNOSIS — C182 Malignant neoplasm of ascending colon: Secondary | ICD-10-CM

## 2016-07-21 DIAGNOSIS — Z5111 Encounter for antineoplastic chemotherapy: Secondary | ICD-10-CM | POA: Diagnosis not present

## 2016-07-21 DIAGNOSIS — D500 Iron deficiency anemia secondary to blood loss (chronic): Secondary | ICD-10-CM | POA: Diagnosis not present

## 2016-07-21 LAB — CBC WITH DIFFERENTIAL/PLATELET
BASO%: 1.3 % (ref 0.0–2.0)
Basophils Absolute: 0.1 10*3/uL (ref 0.0–0.1)
EOS%: 4 % (ref 0.0–7.0)
Eosinophils Absolute: 0.2 10*3/uL (ref 0.0–0.5)
HCT: 26.9 % — ABNORMAL LOW (ref 34.8–46.6)
HGB: 8.8 g/dL — ABNORMAL LOW (ref 11.6–15.9)
LYMPH%: 29.5 % (ref 14.0–49.7)
MCH: 25.2 pg (ref 25.1–34.0)
MCHC: 32.5 g/dL (ref 31.5–36.0)
MCV: 77.4 fL — ABNORMAL LOW (ref 79.5–101.0)
MONO#: 0.6 10*3/uL (ref 0.1–0.9)
MONO%: 11.4 % (ref 0.0–14.0)
NEUT%: 53.8 % (ref 38.4–76.8)
NEUTROS ABS: 2.9 10*3/uL (ref 1.5–6.5)
Platelets: 350 10*3/uL (ref 145–400)
RBC: 3.48 10*6/uL — ABNORMAL LOW (ref 3.70–5.45)
RDW: 16 % — ABNORMAL HIGH (ref 11.2–14.5)
WBC: 5.3 10*3/uL (ref 3.9–10.3)
lymph#: 1.6 10*3/uL (ref 0.9–3.3)

## 2016-07-21 LAB — COMPREHENSIVE METABOLIC PANEL
ALBUMIN: 3 g/dL — AB (ref 3.5–5.0)
ALK PHOS: 132 U/L (ref 40–150)
ALT: 8 U/L (ref 0–55)
AST: 14 U/L (ref 5–34)
Anion Gap: 10 mEq/L (ref 3–11)
BILIRUBIN TOTAL: 0.22 mg/dL (ref 0.20–1.20)
BUN: 7 mg/dL (ref 7.0–26.0)
CALCIUM: 9.4 mg/dL (ref 8.4–10.4)
CO2: 24 mEq/L (ref 22–29)
CREATININE: 0.7 mg/dL (ref 0.6–1.1)
Chloride: 102 mEq/L (ref 98–109)
EGFR: >90 mL/min/{1.73_m2} (ref >90)
Glucose: 92 mg/dl (ref 70–140)
POTASSIUM: 4.3 meq/L (ref 3.5–5.1)
Sodium: 137 mEq/L (ref 136–145)
TOTAL PROTEIN: 6.8 g/dL (ref 6.4–8.3)

## 2016-07-21 MED ORDER — SODIUM CHLORIDE 0.9 % IV SOLN
510.0000 mg | Freq: Once | INTRAVENOUS | Status: AC
  Administered 2016-07-21: 510 mg via INTRAVENOUS
  Filled 2016-07-21: qty 17

## 2016-07-21 MED ORDER — ATROPINE SULFATE 1 MG/ML IJ SOLN: 0.5000 mg | Freq: Once | INTRAMUSCULAR | Status: DC | PRN

## 2016-07-21 MED ORDER — SODIUM CHLORIDE 0.9 % IV SOLN
Freq: Once | INTRAVENOUS | Status: AC
  Administered 2016-07-21: 12:00:00 via INTRAVENOUS

## 2016-07-21 MED ORDER — LEUCOVORIN CALCIUM INJECTION 350 MG
400.0000 mg/m2 | Freq: Once | INTRAVENOUS | Status: AC
  Administered 2016-07-21: 640 mg via INTRAVENOUS
  Filled 2016-07-21: qty 32

## 2016-07-21 MED ORDER — SODIUM CHLORIDE 0.9 % IV SOLN
2400.0000 mg/m2 | INTRAVENOUS | Status: DC
  Administered 2016-07-21: 3850 mg via INTRAVENOUS
  Filled 2016-07-21: qty 77

## 2016-07-21 MED ORDER — PALONOSETRON HCL INJECTION 0.25 MG/5ML
0.2500 mg | Freq: Once | INTRAVENOUS | Status: AC
  Administered 2016-07-21: 0.25 mg via INTRAVENOUS

## 2016-07-21 MED ORDER — ATROPINE SULFATE 1 MG/ML IJ SOLN
INTRAMUSCULAR | Status: AC
  Filled 2016-07-21: qty 1

## 2016-07-21 MED ORDER — PALONOSETRON HCL INJECTION 0.25 MG/5ML
INTRAVENOUS | Status: AC
  Filled 2016-07-21: qty 5

## 2016-07-21 MED ORDER — DEXAMETHASONE SODIUM PHOSPHATE 10 MG/ML IJ SOLN
10.0000 mg | Freq: Once | INTRAMUSCULAR | Status: AC
  Administered 2016-07-21: 10 mg via INTRAVENOUS

## 2016-07-21 MED ORDER — DEXAMETHASONE SODIUM PHOSPHATE 10 MG/ML IJ SOLN
INTRAMUSCULAR | Status: AC
  Filled 2016-07-21: qty 1

## 2016-07-21 MED ORDER — SODIUM CHLORIDE 0.9 % IV SOLN
5.0000 mg/kg | Freq: Once | INTRAVENOUS | Status: AC
  Administered 2016-07-21: 300 mg via INTRAVENOUS
  Filled 2016-07-21: qty 12

## 2016-07-21 MED ORDER — IRINOTECAN HCL CHEMO INJECTION 100 MG/5ML
182.0000 mg/m2 | Freq: Once | INTRAVENOUS | Status: AC
  Administered 2016-07-21: 300 mg via INTRAVENOUS
  Filled 2016-07-21: qty 15

## 2016-07-21 MED ORDER — SODIUM CHLORIDE 0.9 % IJ SOLN
10.0000 mL | INTRAMUSCULAR | Status: DC | PRN
  Administered 2016-07-21: 10 mL
  Filled 2016-07-21: qty 10

## 2016-07-21 NOTE — Patient Instructions (Addendum)
[facility] Discharge Instructions for Patients Receiving Chemotherapy  Today you received the following chemotherapy agents Avastin/5 Fu/Leucovorin/ Irinotecan  To help prevent nausea and vomiting after your treatment, we encourage you to take your nausea medication as prescribed.   If you develop nausea and vomiting that is not controlled by your nausea medication, call the clinic.   BELOW ARE SYMPTOMS THAT SHOULD BE REPORTED IMMEDIATELY:  *FEVER GREATER THAN 100.5 F  *CHILLS WITH OR WITHOUT FEVER  NAUSEA AND VOMITING THAT IS NOT CONTROLLED WITH YOUR NAUSEA MEDICATION  *UNUSUAL SHORTNESS OF BREATH  *UNUSUAL BRUISING OR BLEEDING  TENDERNESS IN MOUTH AND THROAT WITH OR WITHOUT PRESENCE OF ULCERS  *URINARY PROBLEMS  *BOWEL PROBLEMS  UNUSUAL RASH Items with * indicate a potential emergency and should be followed up as soon as possible.  Feel free to call the clinic you have any questions or concerns. The clinic phone number is [phone].  Please show the [place] at check-in to the Emergency Department and triage nurse.  Ferumoxytol injection (Feraheme) What is this medicine? FERUMOXYTOL is an iron complex. Iron is used to make healthy red blood cells, which carry oxygen and nutrients throughout the body. This medicine is used to treat iron deficiency anemia in people with chronic kidney disease. COMMON BRAND NAME(S): Feraheme What should I tell my health care provider before I take this medicine? They need to know if you have any of these conditions: -anemia not caused by low iron levels -high levels of iron in the blood -magnetic resonance imaging (MRI) test scheduled -an unusual or allergic reaction to iron, other medicines, foods, dyes, or preservatives -pregnant or trying to get pregnant -breast-feeding How should I use this medicine? This medicine is for injection into a vein. It is given by a health care professional in a hospital or clinic  setting. Talk to your pediatrician regarding the use of this medicine in children. Special care may be needed. What if I miss a dose? It is important not to miss your dose. Call your doctor or health care professional if you are unable to keep an appointment. What may interact with this medicine? This medicine may interact with the following medications: -other iron products What should I watch for while using this medicine? Visit your doctor or healthcare professional regularly. Tell your doctor or healthcare professional if your symptoms do not start to get better or if they get worse. You may need blood work done while you are taking this medicine. You may need to follow a special diet. Talk to your doctor. Foods that contain iron include: whole grains/cereals, dried fruits, beans, or peas, leafy green vegetables, and organ meats (liver, kidney). What side effects may I notice from receiving this medicine? Side effects that you should report to your doctor or health care professional as soon as possible: -allergic reactions like skin rash, itching or hives, swelling of the face, lips, or tongue -breathing problems -changes in blood pressure -feeling faint or lightheaded, falls -fever or chills -flushing, sweating, or hot feelings -swelling of the ankles or feet Side effects that usually do not require medical attention (report to your doctor or health care professional if they continue or are bothersome): -diarrhea -headache -nausea, vomiting -stomach pain Where should I keep my medicine? This drug is given in a hospital or clinic and will not be stored at home.  2017 Elsevier/Gold Standard (2015-08-13 12:41:49)

## 2016-07-23 ENCOUNTER — Ambulatory Visit (HOSPITAL_BASED_OUTPATIENT_CLINIC_OR_DEPARTMENT_OTHER): Payer: Managed Care, Other (non HMO)

## 2016-07-23 VITALS — BP 141/87 | HR 75 | Temp 97.5°F | Resp 17

## 2016-07-23 DIAGNOSIS — Z452 Encounter for adjustment and management of vascular access device: Secondary | ICD-10-CM

## 2016-07-23 DIAGNOSIS — C182 Malignant neoplasm of ascending colon: Secondary | ICD-10-CM

## 2016-07-23 MED ORDER — SODIUM CHLORIDE 0.9% FLUSH
10.0000 mL | INTRAVENOUS | Status: DC | PRN
  Administered 2016-07-23: 10 mL
  Filled 2016-07-23: qty 10

## 2016-07-23 MED ORDER — HEPARIN SOD (PORK) LOCK FLUSH 100 UNIT/ML IV SOLN
500.0000 [IU] | Freq: Once | INTRAVENOUS | Status: AC | PRN
  Administered 2016-07-23: 500 [IU]
  Filled 2016-07-23: qty 5

## 2016-07-23 NOTE — Patient Instructions (Signed)

## 2016-07-29 ENCOUNTER — Telehealth: Payer: Self-pay | Admitting: *Deleted

## 2016-07-29 NOTE — Telephone Encounter (Signed)
Call from her significant other, Ricky reporting her employer's attendance policy has changed and she needs documentation that she may miss work anywhere from a few hours to a few days in a week due to her treatment. Called back and left him a VM that they should first check with HR department to see if there is a FMLA form that needs to be filled out for this. MD can dictate a letter for her that she will need intermittent leave during her treatment time. Also requested a start date they want on the letter. They can pick up letter on 1/11 or give contact name and fax # to send this letter to. Left navigator's direct number for a return call.

## 2016-08-03 NOTE — Progress Notes (Signed)
[name]  [phone] [phone]  Clinic follow up Note   Patient Care Team: Carol Ada, MD as PCP - General (Family Medicine) Rolm Bookbinder, MD as Consulting Physician (General Surgery) 08/04/2016   CHIEF COMPLAINTS:  Follow up metastatic colon cancer   Oncology History   Cancer of right colon [facility])   Staging form: Colon and Rectum, AJCC 7th Edition     Pathologic stage from 08/03/2015: Stage IIIB (T4a, N1a, cM0) - Signed by Truitt Merle, MD on 09/04/2015       Cancer of right colon ([place])   03/31/2015 Imaging    CT abdomen showed appendicitis, perforated, with periappendiceal abscess. And small pulmonary nodules.      04/03/2015 Procedure    CT guided placement of going Through into the right lower abdominal quadrant with aspiration of a total of 30 mL prudent fluid.       08/03/2015 Initial Diagnosis    Cancer of right colon ([place])      08/03/2015 Surgery    Right hemicolectomy      08/03/2015 Pathology Results    Right hemicolectomy showed invasive adenocarcinoma, moderately differentiated, likely arising from a cecal valve. One out of 40 lymph nodes were positive, (+) LVI, margins negative       08/03/2015 Miscellaneous    colon cance MSI-stable       09/18/2015 - 03/31/2016 Chemotherapy    Adjuvant CAPOX, she tolerated oxaliplatin poorly, which was held from cycle 2 to 4 due to poor tolerance, restarted from cycle 5 with lower dose, she also had significant hand-foot syndrome from Xeloda. She completed a total of 8 cycles.      05/20/2016 Imaging    PET scan revealed hypermetabolic peritoneal carcinomatosis, five scatted pulmonary nodules in both lungs, largest 4 mm, metastasis not excluded. Hypermetabolic mass in the deep subcutaneous ventral right lateral pelvic wall, probable metastasis.      06/02/2016 -  Chemotherapy    FOLFIRI every 2 weeks, started on 06/02/2016, Avastin added from cycle 3       06/26/2016  Miscellaneous    Foundation One revealed KRAS mutation (+), MSI-stable        HISTORY OF PRESENTING ILLNESS:  [name] 51 y.o. female without significant PMH is here because of her recently diagnosed stage III colon cancer. She is accompanied by her boyfriend to the clinic today.  She developed fever and headache in September 2016, was found to have ruptured appendix and was admitted to hospital. CT scan revealed a peri-appendiceal abscess. She underwent abscess drainage tube basement by interventional radiology on 04/17/2015, and a course of antibiotics. She had a multiple drainage tube exchange by IR, but the abscess did not heal. She was referred to Dr. Donne [name]. Multiple CT scan in October and November showed persistent peri-appendiceal fluid collection. She was finally taken to the operation room on 08/03/2015. Intraoperatively, she was found to have a 2 cm whole present in the cecum, she underwent ileocectomy with an anastomosis in the hepatic fixture. 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  She had wound infection issue after surgery. It has been healing slowly, she is still on wet-to-dry dressing change twice daily, has home care nurse. She has moderate pain, 6-7/10, sometime postional, she is on oxycodone 2-3 times a day. Her appetite and eating is getting better, lost about 25lb since 03/2015, no fever and chills for the past 3-4 days, just finished abx yesterday.   CURRENT THERAPY:  chemotherapy FOLFIRI every 2 weeks starting on 06/02/2016, Avastin added on cycle 3   INTERIM HISTORY:  [name] returns for follow-up and third cycle chemo. She has been tolerating chemotherapy well overall, has moderate fatigue and low appetite after chemotherapy, recovers well. Her abdominal pain has much improved since she started to chemotherapy. No other new complaints. She table to continue working full time.   MEDICAL HISTORY:  Past Medical History:  Diagnosis Date  .  Anemia   . Anxiety   . Depression   . Fibromyalgia   . GERD (gastroesophageal reflux disease)   . JP drain bleeding     SURGICAL HISTORY: Past Surgical History:  Procedure Laterality Date  . ABDOMINAL HYSTERECTOMY    . COLON SURGERY  08/03/2015  . COLOSTOMY REVISION N/A 08/03/2015   Procedure: COLON RESECTION RIGHT;  Surgeon: Rolm Bookbinder, MD;  Location: [place];  Service: General;  Laterality: N/A;  . GASTRIC BYPASS    . IR GENERIC HISTORICAL  05/30/2016   IR FLUORO GUIDE PORT INSERTION RIGHT 05/30/2016 Aletta Edouard, MD [facility]-INTERV RAD  . IR GENERIC HISTORICAL  05/30/2016   IR US GUIDE VASC ACCESS RIGHT 05/30/2016 Aletta Edouard, MD [facility]-INTER[facility] RAD  . LAPAROSCOPIC ILEOCECECTOMY Right 08/03/2015   Procedure: LAPAROSCOPIC DIAGNOSTIC RIGHT COLECTOMY;  Surgeon: Rolm Bookbinder, MD;  Location: [place];  Service: General;  Laterality: Right;    SOCIAL HISTORY: Social History   Social History  . Marital status: Married    Spouse name: N/A  . Number of children: N/A  . Years of education: N/A   Occupational History  . Not on file.   Social History Main Topics  . Smoking status: Never Smoker  . Smokeless tobacco: Never Used  . Alcohol use 1.2 oz/week    2 Glasses of wine per week     Comment: moderate 1-2 times a month   . Drug use: No  . Sexual activity: Not on file   Other Topics Concern  . Not on file   Social History Narrative   Single, fiance [name]   Works [employer] 11:30am to 8 pm    FAMILY HISTORY: Family History  Problem Relation Age of Onset  . Cancer Father 74    small cell lung cancer and colon cancer   . Cancer Sister 50    small cell lung cancer  . Cancer Maternal Aunt     ovarian cancer  . Cancer Paternal Uncle     brain cancer   . Cancer Other 40    ovarian cancer (maternal niece)    ALLERGIES:  is allergic to latex; cymbalta [duloxetine hcl]; and penicillins.  MEDICATIONS:  Current Outpatient Prescriptions  Medication Sig  Dispense Refill  . acetaminophen (TYLENOL) 500 MG tablet Take 1,000 mg by mouth every 6 (six) hours as needed for fever. Reported on 09/24/2015    . ALPRAZolam (XANAX) 0.25 MG tablet Take 1-2 tablets (0.25-0.5 mg total) by mouth at bedtime as needed for anxiety or sleep. 60 tablet 0  . bisacodyl (DULCOLAX) 5 MG EC tablet Take 10 mg by mouth daily as needed for moderate constipation.    . cyclobenzaprine (FLEXERIL) 10 MG tablet Take 1 tablet (10 mg total) by mouth 3 (three) times daily as needed for muscle spasms. 60 tablet 0  . dexamethasone (DECADRON) 4 MG tablet Take 2 tablets (8 mg total) by mouth daily. Start day after chemo and take for 3 days after each IV chemo 30 tablet 1  . diphenhydrAMINE (BENADRYL)  25 mg capsule Take 25 mg by mouth every 6 (six) hours as needed for sleep.    Marland Kitchen docusate sodium (COLACE) 100 MG capsule Take 300 mg by mouth daily.    Marland Kitchen escitalopram (LEXAPRO) 10 MG tablet Take 10 mg by mouth daily.    . ferrous sulfate 325 (65 FE) MG tablet Take 325 mg by mouth 2 (two) times daily with a meal. Reported on 10/12/2015    . lidocaine-prilocaine (EMLA) cream Apply 1 application topically as needed. 30 g 2  . ondansetron (ZOFRAN) 8 MG tablet Take 1 tablet (8 mg total) by mouth 2 (two) times daily as needed for refractory nausea / vomiting. Start on day 3 after chemotherapy. 30 tablet 1  . oxyCODONE (OXY IR/ROXICODONE) 5 MG immediate release tablet Take 1-2 tablets (5-10 mg total) by mouth every 6 (six) hours as needed for severe pain. 150 tablet 0  . prochlorperazine (COMPAZINE) 10 MG tablet Take 1 tablet (10 mg total) by mouth every 8 (eight) hours as needed for nausea or vomiting. 30 tablet 1  . prochlorperazine (COMPAZINE) 10 MG tablet Take 1 tablet (10 mg total) by mouth every 6 (six) hours as needed (NAUSEA). 30 tablet 1  . traMADol (ULTRAM) 50 MG tablet Take 1-2 tablets (50-100 mg total) by mouth every 6 (six) hours as needed. 60 tablet 0  . urea (CARMOL) 10 % cream Apply  topically as needed. 71 g 1  . vitamin B-12 (CYANOCOBALAMIN) 1000 MCG tablet Take 500 mcg by mouth daily. Reported on 09/24/2015    . vitamin C (ASCORBIC ACID) 500 MG tablet Take 500 mg by mouth daily.     No current facility-administered medications for this visit.    Facility-Administered Medications Ordered in Other Visits  Medication Dose Route Frequency Provider Last Rate Last Dose  . clindamycin (CLEOCIN) 900 mg in dextrose 5 % 50 mL IVPB  900 mg Intravenous 60 min Pre-Op Rolm Bookbinder, MD       And  . gentamicin (GARAMYCIN) 310 mg in dextrose 5 % 50 mL IVPB  5 mg/kg Intravenous 60 min Pre-Op Rolm Bookbinder, MD      . sodium chloride 0.9 % injection 10 mL  10 mL Intracatheter PRN Truitt Merle, MD   10 mL at 06/23/16 1139  . sodium chloride flush (NS) 0.9 % injection 10 mL  10 mL Intracatheter PRN Truitt Merle, MD   10 mL at 06/04/16 1301    REVIEW OF SYSTEMS:  Constitutional: Denies fevers, chills or abnormal night sweats. (+) fatigue Eyes: Denies blurriness of vision, double vision or watery eyes Ears, nose, mouth, throat, and face: Denies mucositis or sore throat Respiratory: Denies cough, dyspnea or wheezes Cardiovascular: Denies palpitation, chest discomfort or lower extremity swelling Gastrointestinal:  Denies heartburn. (+) nausea, constipation. Skin: Denies abnormal skin rashes Lymphatics: Denies new lymphadenopathy or easy bruising Neurological:Denies numbness, tingling or new weaknesses Behavioral/Psych: Mood is stable, no new changes  All other systems were reviewed with the patient and are negative.  PHYSICAL EXAMINATION: ECOG PERFORMANCE STATUS: 1 - Symptomatic but completely ambulatory  Vitals:   08/04/16 1232  BP: 123/71  Pulse: 84  Resp: 18  Temp: 97 F (36.1 C)   Filed Weights   08/04/16 1232  Weight: 126 lb (57.2 kg)    GENERAL:alert, no distress and comfortable SKIN: skin color, texture, turgor are normal, no rashes or significant lesions EYES:  normal, conjunctiva are pink and non-injected, sclera clear OROPHARYNX:no exudate, no erythema and lips, buccal mucosa, and  tongue normal  NECK: supple, thyroid normal size, non-tender, without nodularity LYMPH:  no palpable lymphadenopathy in the cervical, axillary or inguinal   LUNGS: clear to auscultation and percussion with normal breathing effort HEART: regular rate & rhythm and no murmurs and no lower extremity edema ABDOMEN:abdomen soft, normal bowel sounds,  Large midline surgical wound has completely healed, and appears to be dry without any discharge. 2 x 3 cm palpable mass in the right upper quadrant of the abdomen. A little tender. Musculoskeletal:no cyanosis of digits and no clubbing  PSYCH: alert & oriented x 3 with fluent speech NEURO: no focal motor/sensory deficits  LABORATORY DATA:  I have reviewed the data as listed CBC Latest Ref Rng & Units 08/04/2016 07/21/2016 07/07/2016  WBC 3.9 - 10.3 10e3/uL 4.6 5.3 4.2  Hemoglobin 11.6 - 15.9 g/dL 9.1(L) 8.8(L) 8.7(L)  Hematocrit 34.8 - 46.6 % 28.2(L) 26.9(L) 26.8(L)  Platelets 145 - 400 10e3/uL 297 350 401(H)   CMP Latest Ref Rng & Units 08/04/2016 07/21/2016 07/07/2016  Glucose 70 - 140 mg/dl 86 92 91  BUN 7.0 - 26.0 mg/dL 7.9 7.0 6.2(L)  Creatinine 0.6 - 1.1 mg/dL 0.6 0.7 0.6  Sodium 136 - 145 mEq/L 141 137 138  Potassium 3.5 - 5.1 mEq/L 3.8 4.3 4.1  Chloride 101 - 111 mmol/L - - -  CO2 22 - 29 mEq/L 25 24 24   Calcium 8.4 - 10.4 mg/dL 9.1 9.4 9.7  Total Protein 6.4 - 8.3 g/dL 6.6 6.8 6.8  Total Bilirubin 0.20 - 1.20 mg/dL 0.23 0.22 0.22  Alkaline Phos 40 - 150 U/L 118 132 136  AST 5 - 34 U/L 17 14 14   ALT 0 - 55 U/L 10 8 11    CEA 02/04/2016: 2.5 03/18/2016: 6.84 06/02/2016: 21.73   PATHOLOGY REPORT  05/30/2016 Diagnosis Peritoneum, biopsy, left lower quadrant mesentery ADENOCARCINOMA WITH ABUNDANT EXTRACELLULAR MUCIN CONSISTENT WITH COLONIC PRIMARY   FOUNDATION ONE [id number] 05/30/2016   Diagnosis  08/03/2015 Colon, segmental resection, Right - INVASIVE ADENOCARCINOMA WITH ABUNDANT EXTRACELLULAR MUCIN, MODERATELY DIFFERENTIATED, LIKELY ARISING FROM ILEOCECAL VALVE. - ADENOCARCINOMA EXTENDS AT LEAST INTO THE PERICOLONIC SOFT TISSUE AND IS ASSOCIATED WITH TRANSMURAL DEFECT. - THE PROXIMAL AND DISTAL RESECTION MARGINS ARE NEGATIVE FOR ADENOCARCINOMA. - ACELLULAR MUCIN IS GROSSLY PRESENT AT THE SEROSAL SURFACE. - LYMPHOVASCULAR INVASION IS IDENTIFIED. - METASTATIC CARCINOMA IN 1 OF 40 LYMPH NODES (1/40). - SEE ONCOLOGY TABLE BELOW. Microscopic Comment COLON AND RECTUM (INCLUDING TRANS-ANAL RESECTION): Specimen: Right colon and terminal ileum. Procedure: Resection. Tumor site: Likely ileocecal valve. Specimen integrity: Transmural defect(s). Macroscopic intactness of mesorectum: N/A Macroscopic tumor perforation: Present. Invasive tumor: Maximum size: At least 2.5 cm. Histologic type(s): Adenocarcinoma with abundant extracellular mucin. Histologic grade and differentiation: G2: moderately differentiated. Type of polyp in which invasive carcinoma arose: Tubular adenoma. Microscopic extension of invasive tumor: Adenocarcinoma extends into pericolonic soft tissue and acellular mucin is grossly present at the serosal surface. Lymph-Vascular invasion: Present. Peri-neural invasion: Not identified. Tumor deposit(s) (discontinuous extramural extension): Not identified. Resection margins: Proximal margin: Negative for carcinoma. Distal margin: Negative for carcinoma. Circumferential (radial) (posterior ascending, posterior descending; lateral and posterior mid-rectum; and entire lower 1/3 rectum): Acellular mucin is present at the circumferential tissue edge. Treatment effect (neo-adjuvant therapy): N/A Additional polyp(s): Not identified. Non-neoplastic findings: No significant findings. Lymph nodes: number examined 40; number positive: 1 Pathologic Staging: at least pT4a,  pN1a. Ancillary studies: MMR by IHC and MSI by PCR will be performed and the results reported separately. Additional studies can be performed upon  clinician request.   ADDITIONAL INFORMATION: Mismatch Repair (MMR) Protein Immunohistochemistry (IHC) IHC Expression Result: MLH1: EQUIVOCAL MSH2: EQUIVOCAL MSH6: EQUIVOCAL PMS2: EQUIVOCAL * Internal control demonstrates intact nuclear expression Interpretation: EQUIVOCAL The tumor shows partial staining with all anibodies and hence the results are considered equivocal. Correlated with molecular based MSI testing is strongly recommended.    RADIOGRAPHIC STUDIES: I have personally reviewed the radiological images as listed and agreed with the findings in the report. No results found.  ASSESSMENT & PLAN:  51 y.o. Caucasian female, with past medical history of fibromyalgia, depression, presented with ruptured cecum and pericolonic abscess, required multiple drainage, antibiotics, and eventually right colectomy.  1. Right colon cancer, cecum, moderately differentiated adenocarcinoma, pT4N1aM0, stage IIIA, with perforation, MSI-stable, KRAS mutation (+), peritoneum recurrence - I previously reviewed her scan findings, and surgical pathology results in great details with patient and her Husband.  - she has locally advanced stage III  Disease, with particular high risk features of T4 tumor, proliferation for 3-4 months,  And positive lymph nodes, she is at very high risk for cancer recurrence, especially peritoneal carcinomatosis. -She has completed adjuvant chemotherapy CAPOX, oxaliplatin dose was significantly reduced due to her poor tolerance. -I previously discussed her surveillance CT scan from 04/29/2016, which unfortunately showed probable peritoneal carcinomatosis. No other significant metastasis on the CT scan. We discussed that the metastasis is not definitive based on CT, and needs to be confirmed by biopsy  -I previously reviewed her  PET scan from 05/20/16, which showed hypermetabolic peritoneal carcinomatosis, in a few small lung nodules which are indeterminate. -She has started chemotherapy FOLFIRI and Avastin, tolerating well, and her abdominal pain has improved. -She underwent a liver biopsy on 05/30/16 of the left lower quadrant mesentery revealing adenocarcinoma with abundant extracellular mucin consistent with colonic primary. Foundation One genomic testing revealed KRAS mutation, MSI-stable, she is not a candidate for immunotherapy alone, no benefit from EGFR inhibitor. -The goal of chemotherapy is palliative. -She has been tolerating chemotherapy well, abdominal pain has improved -Lab reviewed, adequate for treatment, we'll proceed cycle 5 chemotherapy today. -Repeat a staging CT scan before next cycle.  2. Abdominal pain -Likely related to her cancer recurrence -Improved since she started chemotherapy -Continue oxycodone as needed   3. Anemia, iron deficient anemia  - likely secondary to surgery,  Chronic infection and GI bleeding from the tumor -Iron study showed low serum iron, transferrin saturation and ferritin 12, which is consistent with iron deficiency -Due to her significant worsening anemia, hemoglobin 8.7 on 06/23/16, I recommendeded IV Feraheme weekly twice . Her anemia has approved some -We'll consider blood transfusion if hemoglobin less than 8 or symptomatic anemia with hemoglobin 8-9 -She will continue oral iron  4. Fibromyalgia and depression - she will continue medication, and follow-up with her primary care physician   Plan -5th cycle FOLFIRI today, no 5-fu bolus, no neulasta, continue every 2 weeks -Repeat a staging CT chest, abdomen and pelvis with contrast a few days before next cycle in 2 weeks  All questions were answered. The patient knows to call the clinic with any problems, questions or concerns.  I spent 20 minutes counseling the patient face to face. The total time spent in the  appointment was 25 minutes and more than 50% was on counseling.     Truitt Merle, MD 08/04/2016

## 2016-08-04 ENCOUNTER — Other Ambulatory Visit (HOSPITAL_BASED_OUTPATIENT_CLINIC_OR_DEPARTMENT_OTHER): Payer: Managed Care, Other (non HMO)

## 2016-08-04 ENCOUNTER — Other Ambulatory Visit: Payer: Managed Care, Other (non HMO)

## 2016-08-04 ENCOUNTER — Ambulatory Visit (HOSPITAL_BASED_OUTPATIENT_CLINIC_OR_DEPARTMENT_OTHER): Payer: Managed Care, Other (non HMO) | Admitting: Hematology

## 2016-08-04 ENCOUNTER — Ambulatory Visit: Payer: Managed Care, Other (non HMO)

## 2016-08-04 ENCOUNTER — Encounter: Payer: Self-pay | Admitting: Hematology

## 2016-08-04 ENCOUNTER — Ambulatory Visit (HOSPITAL_BASED_OUTPATIENT_CLINIC_OR_DEPARTMENT_OTHER): Payer: Managed Care, Other (non HMO)

## 2016-08-04 ENCOUNTER — Ambulatory Visit: Payer: Managed Care, Other (non HMO) | Admitting: Hematology

## 2016-08-04 VITALS — BP 130/80 | HR 87

## 2016-08-04 VITALS — BP 123/71 | HR 84 | Temp 97.0°F | Resp 18 | Wt 126.0 lb

## 2016-08-04 DIAGNOSIS — C786 Secondary malignant neoplasm of retroperitoneum and peritoneum: Secondary | ICD-10-CM

## 2016-08-04 DIAGNOSIS — C182 Malignant neoplasm of ascending colon: Secondary | ICD-10-CM

## 2016-08-04 DIAGNOSIS — R109 Unspecified abdominal pain: Secondary | ICD-10-CM

## 2016-08-04 DIAGNOSIS — Z5112 Encounter for antineoplastic immunotherapy: Secondary | ICD-10-CM | POA: Diagnosis not present

## 2016-08-04 DIAGNOSIS — D509 Iron deficiency anemia, unspecified: Secondary | ICD-10-CM

## 2016-08-04 DIAGNOSIS — D500 Iron deficiency anemia secondary to blood loss (chronic): Secondary | ICD-10-CM

## 2016-08-04 DIAGNOSIS — R911 Solitary pulmonary nodule: Secondary | ICD-10-CM

## 2016-08-04 DIAGNOSIS — F329 Major depressive disorder, single episode, unspecified: Secondary | ICD-10-CM

## 2016-08-04 DIAGNOSIS — M791 Myalgia: Secondary | ICD-10-CM

## 2016-08-04 DIAGNOSIS — Z5111 Encounter for antineoplastic chemotherapy: Secondary | ICD-10-CM | POA: Diagnosis not present

## 2016-08-04 LAB — CBC WITH DIFFERENTIAL/PLATELET
BASO%: 1.8 % (ref 0.0–2.0)
Basophils Absolute: 0.1 10*3/uL (ref 0.0–0.1)
EOS%: 5 % (ref 0.0–7.0)
Eosinophils Absolute: 0.2 10*3/uL (ref 0.0–0.5)
HCT: 28.2 % — ABNORMAL LOW (ref 34.8–46.6)
HGB: 9.1 g/dL — ABNORMAL LOW (ref 11.6–15.9)
LYMPH%: 33.5 % (ref 14.0–49.7)
MCH: 25.4 pg (ref 25.1–34.0)
MCHC: 32.3 g/dL (ref 31.5–36.0)
MCV: 78.6 fL — ABNORMAL LOW (ref 79.5–101.0)
MONO#: 0.5 10*3/uL (ref 0.1–0.9)
MONO%: 10.2 % (ref 0.0–14.0)
NEUT%: 49.5 % (ref 38.4–76.8)
NEUTROS ABS: 2.3 10*3/uL (ref 1.5–6.5)
PLATELETS: 297 10*3/uL (ref 145–400)
RBC: 3.59 10*6/uL — AB (ref 3.70–5.45)
RDW: 20.4 % — ABNORMAL HIGH (ref 11.2–14.5)
WBC: 4.6 10*3/uL (ref 3.9–10.3)
lymph#: 1.5 10*3/uL (ref 0.9–3.3)

## 2016-08-04 LAB — COMPREHENSIVE METABOLIC PANEL
ALBUMIN: 3.1 g/dL — AB (ref 3.5–5.0)
ALT: 10 U/L (ref 0–55)
AST: 17 U/L (ref 5–34)
Alkaline Phosphatase: 118 U/L (ref 40–150)
Anion Gap: 9 mEq/L (ref 3–11)
BUN: 7.9 mg/dL (ref 7.0–26.0)
CO2: 25 mEq/L (ref 22–29)
Calcium: 9.1 mg/dL (ref 8.4–10.4)
Chloride: 107 mEq/L (ref 98–109)
Creatinine: 0.6 mg/dL (ref 0.6–1.1)
GLUCOSE: 86 mg/dL (ref 70–140)
POTASSIUM: 3.8 meq/L (ref 3.5–5.1)
Sodium: 141 mEq/L (ref 136–145)
Total Bilirubin: 0.23 mg/dL (ref 0.20–1.20)
Total Protein: 6.6 g/dL (ref 6.4–8.3)

## 2016-08-04 MED ORDER — HEPARIN SOD (PORK) LOCK FLUSH 100 UNIT/ML IV SOLN
500.0000 [IU] | Freq: Once | INTRAVENOUS | Status: DC | PRN
  Filled 2016-08-04: qty 5

## 2016-08-04 MED ORDER — SODIUM CHLORIDE 0.9 % IV SOLN
2400.0000 mg/m2 | INTRAVENOUS | Status: DC
  Administered 2016-08-04: 3850 mg via INTRAVENOUS
  Filled 2016-08-04: qty 77

## 2016-08-04 MED ORDER — SODIUM CHLORIDE 0.9 % IV SOLN
Freq: Once | INTRAVENOUS | Status: AC
  Administered 2016-08-04: 13:00:00 via INTRAVENOUS

## 2016-08-04 MED ORDER — BEVACIZUMAB CHEMO INJECTION 400 MG/16ML
5.0000 mg/kg | Freq: Once | INTRAVENOUS | Status: AC
  Administered 2016-08-04: 300 mg via INTRAVENOUS
  Filled 2016-08-04: qty 12

## 2016-08-04 MED ORDER — ATROPINE SULFATE 1 MG/ML IJ SOLN: 0.5000 mg | Freq: Once | INTRAMUSCULAR | Status: DC | PRN

## 2016-08-04 MED ORDER — SODIUM CHLORIDE 0.9 % IJ SOLN
10.0000 mL | INTRAMUSCULAR | Status: DC | PRN
  Administered 2016-08-04: 10 mL
  Filled 2016-08-04: qty 10

## 2016-08-04 MED ORDER — PALONOSETRON HCL INJECTION 0.25 MG/5ML
INTRAVENOUS | Status: AC
  Filled 2016-08-04: qty 5

## 2016-08-04 MED ORDER — DEXAMETHASONE SODIUM PHOSPHATE 10 MG/ML IJ SOLN
10.0000 mg | Freq: Once | INTRAMUSCULAR | Status: AC
  Administered 2016-08-04: 10 mg via INTRAVENOUS

## 2016-08-04 MED ORDER — ATROPINE SULFATE 1 MG/ML IJ SOLN
INTRAMUSCULAR | Status: AC
  Filled 2016-08-04: qty 1

## 2016-08-04 MED ORDER — DEXAMETHASONE SODIUM PHOSPHATE 10 MG/ML IJ SOLN
INTRAMUSCULAR | Status: AC
  Filled 2016-08-04: qty 1

## 2016-08-04 MED ORDER — LEUCOVORIN CALCIUM INJECTION 350 MG
400.0000 mg/m2 | Freq: Once | INTRAVENOUS | Status: AC
  Administered 2016-08-04: 640 mg via INTRAVENOUS
  Filled 2016-08-04: qty 32

## 2016-08-04 MED ORDER — IRINOTECAN HCL CHEMO INJECTION 100 MG/5ML: 180.0000 mg/m2 | Freq: Once | INTRAVENOUS | Status: DC

## 2016-08-04 MED ORDER — PALONOSETRON HCL INJECTION 0.25 MG/5ML
0.2500 mg | Freq: Once | INTRAVENOUS | Status: AC
  Administered 2016-08-04: 0.25 mg via INTRAVENOUS

## 2016-08-04 MED ORDER — IRINOTECAN HCL CHEMO INJECTION 100 MG/5ML
300.0000 mg | Freq: Once | INTRAVENOUS | Status: AC
  Administered 2016-08-04: 300 mg via INTRAVENOUS
  Filled 2016-08-04: qty 15

## 2016-08-04 NOTE — Patient Instructions (Signed)

## 2016-08-04 NOTE — Patient Instructions (Signed)
[facility] Discharge Instructions for Patients Receiving Chemotherapy  Today you received the following chemotherapy agents: Avastin, Irinotecan, Leucovorin and Adrucil   To help prevent nausea and vomiting after your treatment, we encourage you to take your nausea medication as directed.    If you develop nausea and vomiting that is not controlled by your nausea medication, call the clinic.   BELOW ARE SYMPTOMS THAT SHOULD BE REPORTED IMMEDIATELY:  *FEVER GREATER THAN 100.5 F  *CHILLS WITH OR WITHOUT FEVER  NAUSEA AND VOMITING THAT IS NOT CONTROLLED WITH YOUR NAUSEA MEDICATION  *UNUSUAL SHORTNESS OF BREATH  *UNUSUAL BRUISING OR BLEEDING  TENDERNESS IN MOUTH AND THROAT WITH OR WITHOUT PRESENCE OF ULCERS  *URINARY PROBLEMS  *BOWEL PROBLEMS  UNUSUAL RASH Items with * indicate a potential emergency and should be followed up as soon as possible.  Feel free to call the clinic you have any questions or concerns. The clinic phone number is [phone].  Please show the CHEMO ALERT CARD at check-in to the Emergency Department and triage nurse.   

## 2016-08-06 ENCOUNTER — Ambulatory Visit (HOSPITAL_BASED_OUTPATIENT_CLINIC_OR_DEPARTMENT_OTHER): Payer: Managed Care, Other (non HMO)

## 2016-08-06 VITALS — BP 139/96 | HR 83 | Temp 98.5°F | Resp 20

## 2016-08-06 DIAGNOSIS — C182 Malignant neoplasm of ascending colon: Secondary | ICD-10-CM

## 2016-08-06 DIAGNOSIS — Z452 Encounter for adjustment and management of vascular access device: Secondary | ICD-10-CM

## 2016-08-06 MED ORDER — HEPARIN SOD (PORK) LOCK FLUSH 100 UNIT/ML IV SOLN
500.0000 [IU] | Freq: Once | INTRAVENOUS | Status: AC | PRN
  Administered 2016-08-06: 500 [IU]
  Filled 2016-08-06: qty 5

## 2016-08-06 MED ORDER — SODIUM CHLORIDE 0.9% FLUSH
10.0000 mL | INTRAVENOUS | Status: DC | PRN
  Administered 2016-08-06: 10 mL
  Filled 2016-08-06: qty 10

## 2016-08-08 ENCOUNTER — Encounter: Payer: Self-pay | Admitting: Hematology

## 2016-08-08 NOTE — Progress Notes (Signed)
Faxed disability paperwork to [employer] at [phone] °

## 2016-08-16 NOTE — Progress Notes (Signed)
[facility]  [phone] [phone]  Clinic follow up Note   Patient Care Team: Carol Ada, MD as PCP - General (Family Medicine) Rolm Bookbinder, MD as Consulting Physician (General Surgery) 08/18/2016  CHIEF COMPLAINTS:  Follow up metastatic colon cancer   Oncology History   Cancer of right colon [facility])   Staging form: Colon and Rectum, AJCC 7th Edition     Pathologic stage from 08/03/2015: Stage IIIB (T4a, N1a, cM0) - Signed by Truitt Merle, MD on 09/04/2015       Cancer of right colon ([place])   03/31/2015 Imaging    CT abdomen showed appendicitis, perforated, with periappendiceal abscess. And small pulmonary nodules.      04/03/2015 Procedure    CT guided placement of going Through into the right lower abdominal quadrant with aspiration of a total of 30 mL prudent fluid.       08/03/2015 Initial Diagnosis    Cancer of right colon ([place])      08/03/2015 Surgery    Right hemicolectomy      08/03/2015 Pathology Results    Right hemicolectomy showed invasive adenocarcinoma, moderately differentiated, likely arising from a cecal valve. One out of 40 lymph nodes were positive, (+) LVI, margins negative       08/03/2015 Miscellaneous    colon cance MSI-stable       09/18/2015 - 03/31/2016 Chemotherapy    Adjuvant CAPOX, she tolerated oxaliplatin poorly, which was held from cycle 2 to 4 due to poor tolerance, restarted from cycle 5 with lower dose, she also had significant hand-foot syndrome from Xeloda. She completed a total of 8 cycles.      05/20/2016 Imaging    PET scan revealed hypermetabolic peritoneal carcinomatosis, five scatted pulmonary nodules in both lungs, largest 4 mm, metastasis not excluded. Hypermetabolic mass in the deep subcutaneous ventral right lateral pelvic wall, probable metastasis.      06/02/2016 -  Chemotherapy    FOLFIRI every 2 weeks, started on 06/02/2016, Avastin added from cycle 3       06/26/2016  Miscellaneous    Foundation One revealed KRAS mutation (+), MSI-stable       08/17/2016 Imaging    CT CAP IMPRESSION: 1. Interval progression of peritoneal carcinomatosis with progressive scalloping of the liver and increase in size of the peritoneal masses. 2. Multiple pulmonary nodules are again noted. All these remain stable with the exception of a left upper lobe nodule which has increased to 5 mm from 3 mm previously.       HISTORY OF PRESENTING ILLNESS:  [name] 51 y.o. female without significant PMH is here because of her recently diagnosed stage III colon cancer. She is accompanied by her boyfriend to the clinic today.  She developed fever and headache in September 2016, was found to have ruptured appendix and was admitted to hospital. CT scan revealed a peri-appendiceal abscess. She underwent abscess drainage tube basement by interventional radiology on 04/17/2015, and a course of antibiotics. She had a multiple drainage tube exchange by IR, but the abscess did not heal. She was referred to Dr. Donne [name]. Multiple CT scan in October and November showed persistent peri-appendiceal fluid collection. She was finally taken to the operation room on 08/03/2015. Intraoperatively, she was found to have a 2 cm whole present in the cecum, she underwent ileocectomy with an anastomosis in the hepatic fixture. 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  She had wound infection issue after surgery.  It has been healing slowly, she is still on wet-to-dry dressing change twice daily, has home care nurse. She has moderate pain, 6-7/10, sometime postional, she is on oxycodone 2-3 times a day. Her appetite and eating is getting better, lost about 25lb since 03/2015, no fever and chills for the past 3-4 days, just finished abx yesterday.   CURRENT THERAPY: chemotherapy FOLFIRI every 2 weeks starting on 06/02/2016, Avastin added on cycle 3   INTERIM HISTORY:  Fransheska returns for  follow-up and 6th cycle chemo. She is getting married on Feb 14 and is very excited. She says she's feeling good today. This past week she has been very tired, nauseous, and swollen in her abdomen. She says the swelling in her abdomen feels like she has gas. She occasionally has some cramping. She takes pain medication about 2-4 times a day for this. She is unsure if all of this could be due to the stress she's been in for the past week. She is not having a hard time sleeping since she takes medication and drinks a glass of wine before bed. Denies any other complaints.   MEDICAL HISTORY:  Past Medical History:  Diagnosis Date  . Anemia   . Anxiety   . Depression   . Fibromyalgia   . GERD (gastroesophageal reflux disease)   . JP drain bleeding     SURGICAL HISTORY: Past Surgical History:  Procedure Laterality Date  . ABDOMINAL HYSTERECTOMY    . COLON SURGERY  08/03/2015  . COLOSTOMY REVISION N/A 08/03/2015   Procedure: COLON RESECTION RIGHT;  Surgeon: Rolm Bookbinder, MD;  Location: [place];  Service: General;  Laterality: N/A;  . GASTRIC BYPASS    . IR GENERIC HISTORICAL  05/30/2016   IR FLUORO GUIDE PORT INSERTION RIGHT 05/30/2016 Aletta Edouard, MD [facility]-INTERV RAD  . IR GENERIC HISTORICAL  05/30/2016   IR US GUIDE VASC ACCESS RIGHT 05/30/2016 Aletta Edouard, MD [facility]INTER[facility]  . LAPAROSCOPIC ILEOCECECTOMY Right 08/03/2015   Procedure: LAPAROSCOPIC DIAGNOSTIC RIGHT COLECTOMY;  Surgeon: Rolm Bookbinder, MD;  Location: [place];  Service: General;  Laterality: Right;    SOCIAL HISTORY: Social History   Social History  . Marital status: Significant Other    Spouse name: N/A  . Number of children: N/A  . Years of education: N/A   Occupational History  . Not on file.   Social History Main Topics  . Smoking status: Never Smoker  . Smokeless tobacco: Never Used  . Alcohol use 1.2 oz/week    2 Glasses of wine per week     Comment: moderate 1-2 times a month   . Drug use: No  . Sexual  activity: Not on file   Other Topics Concern  . Not on file   Social History Narrative   Single, fiance [name]   Works [employer] 11:30am to 8 pm    FAMILY HISTORY: Family History  Problem Relation Age of Onset  . Cancer Father 65    small cell lung cancer and colon cancer   . Cancer Sister 50    small cell lung cancer  . Cancer Maternal Aunt     ovarian cancer  . Cancer Paternal Uncle     brain cancer   . Cancer Other 40    ovarian cancer (maternal niece)    ALLERGIES:  is allergic to latex; cymbalta [duloxetine hcl]; and penicillins.  MEDICATIONS:  Current Outpatient Prescriptions  Medication Sig Dispense Refill  . acetaminophen (TYLENOL) 500 MG tablet Take  1,000 mg by mouth every 6 (six) hours as needed for fever. Reported on 09/24/2015    . ALPRAZolam (XANAX) 0.25 MG tablet Take 1-2 tablets (0.25-0.5 mg total) by mouth at bedtime as needed for anxiety or sleep. 60 tablet 0  . bisacodyl (DULCOLAX) 5 MG EC tablet Take 10 mg by mouth daily as needed for moderate constipation.    . cyclobenzaprine (FLEXERIL) 10 MG tablet Take 1 tablet (10 mg total) by mouth 3 (three) times daily as needed for muscle spasms. 60 tablet 0  . dexamethasone (DECADRON) 4 MG tablet Take 2 tablets (8 mg total) by mouth daily. Start day after chemo and take for 3 days after each IV chemo 30 tablet 1  . diphenhydrAMINE (BENADRYL) 25 mg capsule Take 25 mg by mouth every 6 (six) hours as needed for sleep.    Marland Kitchen docusate sodium (COLACE) 100 MG capsule Take 300 mg by mouth daily.    Marland Kitchen escitalopram (LEXAPRO) 10 MG tablet Take 10 mg by mouth daily.    . ferrous sulfate 325 (65 FE) MG tablet Take 325 mg by mouth 2 (two) times daily with a meal. Reported on 10/12/2015    . lidocaine-prilocaine (EMLA) cream Apply 1 application topically as needed. 30 g 2  . ondansetron (ZOFRAN) 8 MG tablet Take 1 tablet (8 mg total) by mouth 2 (two) times daily as needed for refractory nausea / vomiting. Start on day  3 after chemotherapy. 30 tablet 1  . Oxycodone HCl 10 MG TABS Take 1 tablet (10 mg total) by mouth every 4 (four) hours. 120 tablet 0  . prochlorperazine (COMPAZINE) 10 MG tablet Take 1 tablet (10 mg total) by mouth every 8 (eight) hours as needed for nausea or vomiting. 30 tablet 1  . prochlorperazine (COMPAZINE) 10 MG tablet Take 1 tablet (10 mg total) by mouth every 6 (six) hours as needed (NAUSEA). 30 tablet 1  . traMADol (ULTRAM) 50 MG tablet Take 1-2 tablets (50-100 mg total) by mouth every 6 (six) hours as needed. 60 tablet 0  . urea (CARMOL) 10 % cream Apply topically as needed. 71 g 1  . vitamin B-12 (CYANOCOBALAMIN) 1000 MCG tablet Take 500 mcg by mouth daily. Reported on 09/24/2015    . vitamin C (ASCORBIC ACID) 500 MG tablet Take 500 mg by mouth daily.     No current facility-administered medications for this visit.    Facility-Administered Medications Ordered in Other Visits  Medication Dose Route Frequency Provider Last Rate Last Dose  . clindamycin (CLEOCIN) 900 mg in dextrose 5 % 50 mL IVPB  900 mg Intravenous 60 min Pre-Op Rolm Bookbinder, MD       And  . gentamicin (GARAMYCIN) 310 mg in dextrose 5 % 50 mL IVPB  5 mg/kg Intravenous 60 min Pre-Op Rolm Bookbinder, MD      . sodium chloride 0.9 % injection 10 mL  10 mL Intracatheter PRN Truitt Merle, MD   10 mL at 06/23/16 1139  . sodium chloride flush (NS) 0.9 % injection 10 mL  10 mL Intracatheter PRN Truitt Merle, MD   10 mL at 06/04/16 1301  . sodium chloride flush (NS) 0.9 % injection 10 mL  10 mL Intracatheter PRN Truitt Merle, MD   10 mL at 08/20/16 1230    REVIEW OF SYSTEMS:  Constitutional: Denies fevers, chills or abnormal night sweats. (+) fatigue Eyes: Denies blurriness of vision, double vision or watery eyes Ears, nose, mouth, throat, and face: Denies mucositis or sore throat  Respiratory: Denies cough, dyspnea or wheezes Cardiovascular: Denies palpitation, chest discomfort or lower extremity swelling Gastrointestinal:   Denies heartburn. (+) nausea, swelling in abdomen Skin: Denies abnormal skin rashes Lymphatics: Denies new lymphadenopathy or easy bruising  Neurological:Denies numbness, tingling or new weaknesses Behavioral/Psych: Mood is stable, no new changes  All other systems were reviewed with the patient and are negative.  PHYSICAL EXAMINATION: ECOG PERFORMANCE STATUS: 1 - Symptomatic but completely ambulatory  Vitals:   08/18/16 1238  BP: 124/65  Pulse: 97  Resp: 18  Temp: 98.2 F (36.8 C)   Filed Weights   08/18/16 1238  Weight: 124 lb 12.8 oz (56.6 kg)   GENERAL:alert, no distress and comfortable SKIN: skin color, texture, turgor are normal, no rashes or significant lesions EYES: normal, conjunctiva are pink and non-injected, sclera clear OROPHARYNX:no exudate, no erythema and lips, buccal mucosa, and tongue normal  NECK: supple, thyroid normal size, non-tender, without nodularity LYMPH:  no palpable lymphadenopathy in the cervical, axillary or inguinal   LUNGS: clear to auscultation and percussion with normal breathing effort HEART: regular rate & rhythm and no murmurs and no lower extremity edema ABDOMEN:abdomen soft, normal bowel sounds,  (+) Large midline surgical wound has completely healed, and appears to be dry without any discharge. 2 x 3 cm palpable mass in the right upper quadrant of the abdomen. A little tender., Diffuse tenderness mainly in epigastric area Musculoskeletal:no cyanosis of digits and no clubbing  PSYCH: alert & oriented x 3 with fluent speech NEURO: no focal motor/sensory deficits  LABORATORY DATA:  I have reviewed the data as listed CBC Latest Ref Rng & Units 08/18/2016 08/04/2016 07/21/2016  WBC 3.9 - 10.3 10e3/uL 5.7 4.6 5.3  Hemoglobin 11.6 - 15.9 g/dL 9.7(L) 9.1(L) 8.8(L)  Hematocrit 34.8 - 46.6 % 30.6(L) 28.2(L) 26.9(L)  Platelets 145 - 400 10e3/uL 271 297 350   CMP Latest Ref Rng & Units 08/18/2016 08/04/2016 07/21/2016  Glucose 70 - 140 mg/dl 82 86  92  BUN 7.0 - 26.0 mg/dL 10.3 7.9 7.0  Creatinine 0.6 - 1.1 mg/dL 0.6 0.6 0.7  Sodium 136 - 145 mEq/L 139 141 137  Potassium 3.5 - 5.1 mEq/L 4.4 3.8 4.3  Chloride 101 - 111 mmol/L - - -  CO2 22 - 29 mEq/L 25 25 24   Calcium 8.4 - 10.4 mg/dL 9.8 9.1 9.4  Total Protein 6.4 - 8.3 g/dL 6.8 6.6 6.8  Total Bilirubin 0.20 - 1.20 mg/dL 0.24 0.23 0.22  Alkaline Phos 40 - 150 U/L 129 118 132  AST 5 - 34 U/L 20 17 14   ALT 0 - 55 U/L 12 10 8    CEA 02/04/2016: 2.5 03/18/2016: 6.84 06/02/2016: 21.73 08/18/2016: 14.27  PATHOLOGY REPORT  05/30/2016 Diagnosis Peritoneum, biopsy, left lower quadrant mesentery ADENOCARCINOMA WITH ABUNDANT EXTRACELLULAR MUCIN CONSISTENT WITH COLONIC PRIMARY   FOUNDATION ONE [id number] 05/30/2016   Diagnosis 08/03/2015 Colon, segmental resection, Right - INVASIVE ADENOCARCINOMA WITH ABUNDANT EXTRACELLULAR MUCIN, MODERATELY DIFFERENTIATED, LIKELY ARISING FROM ILEOCECAL VALVE. - ADENOCARCINOMA EXTENDS AT LEAST INTO THE PERICOLONIC SOFT TISSUE AND IS ASSOCIATED WITH TRANSMURAL DEFECT. - THE PROXIMAL AND DISTAL RESECTION MARGINS ARE NEGATIVE FOR ADENOCARCINOMA. - ACELLULAR MUCIN IS GROSSLY PRESENT AT THE SEROSAL SURFACE. - LYMPHOVASCULAR INVASION IS IDENTIFIED. - METASTATIC CARCINOMA IN 1 OF 40 LYMPH NODES (1/40). - SEE ONCOLOGY TABLE BELOW. Microscopic Comment COLON AND RECTUM (INCLUDING TRANS-ANAL RESECTION): Specimen: Right colon and terminal ileum. Procedure: Resection. Tumor site: Likely ileocecal valve. Specimen integrity: Transmural defect(s). Macroscopic intactness of mesorectum: N/A Macroscopic tumor  perforation: Present. Invasive tumor: Maximum size: At least 2.5 cm. Histologic type(s): Adenocarcinoma with abundant extracellular mucin. Histologic grade and differentiation: G2: moderately differentiated. Type of polyp in which invasive carcinoma arose: Tubular adenoma. Microscopic extension of invasive tumor: Adenocarcinoma extends into pericolonic soft  tissue and acellular mucin is grossly present at the serosal surface. Lymph-Vascular invasion: Present. Peri-neural invasion: Not identified. Tumor deposit(s) (discontinuous extramural extension): Not identified. Resection margins: Proximal margin: Negative for carcinoma. Distal margin: Negative for carcinoma. Circumferential (radial) (posterior ascending, posterior descending; lateral and posterior mid-rectum; and entire lower 1/3 rectum): Acellular mucin is present at the circumferential tissue edge. Treatment effect (neo-adjuvant therapy): N/A Additional polyp(s): Not identified. Non-neoplastic findings: No significant findings. Lymph nodes: number examined 40; number positive: 1 Pathologic Staging: at least pT4a, pN1a. Ancillary studies: MMR by IHC and MSI by PCR will be performed and the results reported separately. Additional studies can be performed upon clinician request.   ADDITIONAL INFORMATION: Mismatch Repair (MMR) Protein Immunohistochemistry (IHC) IHC Expression Result: MLH1: EQUIVOCAL MSH2: EQUIVOCAL MSH6: EQUIVOCAL PMS2: EQUIVOCAL * Internal control demonstrates intact nuclear expression Interpretation: EQUIVOCAL The tumor shows partial staining with all anibodies and hence the results are considered equivocal. Correlated with molecular based MSI testing is strongly recommended.    RADIOGRAPHIC STUDIES: I have personally reviewed the radiological images as listed and agreed with the findings in the report.  CT CAP 08/17/2016 IMPRESSION: 1. Interval progression of peritoneal carcinomatosis with progressive scalloping of the liver and increase in size of the peritoneal masses. 2. Multiple pulmonary nodules are again noted. All these remain stable with the exception of a left upper lobe nodule which has increased to 5 mm from 3 mm previously.  NM PET 05/20/2016 IMPRESSION: 1. Peritoneal carcinomatosis with hypermetabolic peritoneal masses throughout the  perihepatic space, left upper quadrant, omentum, mesenteric and pelvic peritoneum. Small volume pelvic ascites. 2. Hypermetabolic mass in the deep subcutaneous ventral right lateral pelvic wall, probably a metastasis. 3. Five scattered pulmonary nodules in both lungs, largest 4 mm, below PET resolution, two of which in the upper lobes are new since 09/03/2015, cannot exclude pulmonary metastases. 4. Nonenlarged mildly hypermetabolic left axillary lymph node, nonspecific, favor benign injection related uptake (radiotracer was injected in a left hand peripheral IV). 5. Additional findings include aortic atherosclerosis and 2 vessel coronary atherosclerosis.  ASSESSMENT & PLAN:  51 y.o. Caucasian female, with past medical history of fibromyalgia, depression, presented with ruptured cecum and pericolonic abscess, required multiple drainage, antibiotics, and eventually right colectomy.  1. Right colon cancer, cecum, moderately differentiated adenocarcinoma, pT4N1aM0, stage IIIA, with perforation, MSI-stable, KRAS mutation (+), peritoneum recurrence - I previously reviewed her scan findings, and surgical pathology results in great details with patient and her Husband.  - she had locally advanced stage III  Disease, with particular high risk features of T4 tumor, proliferation for 3-4 months,  And positive lymph nodes, she is at very high risk for cancer recurrence, especially peritoneal carcinomatosis. -She has completed adjuvant chemotherapy CAPOX, oxaliplatin dose was significantly reduced due to her poor tolerance. -I previously discussed her surveillance CT scan from 04/29/2016, which unfortunately showed probable peritoneal carcinomatosis. No other significant metastasis on the CT scan. We discussed that the metastasis is not definitive based on CT, and needs to be confirmed by biopsy  -I previously reviewed her PET scan from 05/20/16, which showed hypermetabolic peritoneal carcinomatosis, in a  few small lung nodules which are indeterminate. -She has started chemotherapy FOLFIRI and Avastin, tolerating well, and her abdominal pain  has improved. -She underwent a liver biopsy on 05/30/16 of the left lower quadrant mesentery revealing adenocarcinoma with abundant extracellular mucin consistent with colonic primary. Foundation One genomic testing revealed KRAS mutation, MSI-stable, she is not a candidate for immunotherapy alone, no benefit from EGFR inhibitor. -The goal of chemotherapy is palliative. -I personally reviewed her restaging CT scan from 08/18/2016, and compared to her previous CT scan from 04/29/2016, and PET scan from 05/20/2016 (reporting radiologist only compared to the CT). She did have disease progression in the peritoneum compared to the prior CT scan, however overall stable or slight progression when compared to the PET scan (although it's non-contrast CT). She is clinically doing moderately well, pain has improved, CEA has decreased since she started chemotherapy, I'll continue current chemotherapy, and we'll obtain a close follow-up scan in 6-8 weeks, to determine if we need to switch her chemotherapy regimen. -Lab reviewed, adequate for treatment, we'll proceed cycle 6 chemotherapy today. -I have encouraged to cut back on her wine drinking.   2. Abdominal pain -Likely related to her cancer recurrence -Improved since she started chemotherapy -Continue oxycodone as needed  -I will prescribe her pain medication in 10 mg pills rather than 5 mg pills so that she doesn't have to take 2 at a time. If she continues to need more and more pain medication, I will look into prescribing her a longer lasting one.   3. Anemia, iron deficient anemia  - likely secondary to surgery,  Chronic infection and GI bleeding from the tumor -Previous Iron study showed low serum iron, transferrin saturation and ferritin 12, which is consistent with iron deficiency -Due to her significant worsening  anemia, hemoglobin 8.7 on 06/23/16, I recommendeded IV Feraheme weekly twice . Her anemia has approved some -We'll consider blood transfusion if hemoglobin less than 8 or symptomatic anemia with hemoglobin 8-9 -She will continue oral iron  4. Fibromyalgia and depression - she will continue medication, and follow-up with her primary care physician   Plan -6th cycle FOLFIRI today, no 5-fu bolus, no neulasta, continue every 2 weeks for a couple of more cycles.  -Repeat PET or CT scan in 6 weeks.  -Refill pain medication oxycodone 10 mg rather than 5 mg.  -Refill Xanax.  -I will see her again in 2 weeks.   All questions were answered. The patient knows to call the clinic with any problems, questions or concerns.  I spent 20 minutes counseling the patient face to face. The total time spent in the appointment was 25 minutes and more than 50% was on counseling.   This document serves as a record of services personally performed by Truitt Merle, MD. It was created on her behalf by Martinique Casey, a trained medical scribe. The creation of this record is based on the scribe's personal observations and the provider's statements to them. This document has been checked and approved by the attending provider.  I have reviewed the above documentation for accuracy and completeness, and I agree with the above information.       Truitt Merle, MD 08/18/2016

## 2016-08-17 ENCOUNTER — Ambulatory Visit (HOSPITAL_COMMUNITY)
Admission: RE | Admit: 2016-08-17 | Discharge: 2016-08-17 | Disposition: A | Discharge status: Home / Self Care | Payer: Managed Care, Other (non HMO) | Source: Ambulatory Visit | Attending: Hematology | Admitting: Hematology

## 2016-08-17 DIAGNOSIS — C182 Malignant neoplasm of ascending colon: Secondary | ICD-10-CM | POA: Diagnosis not present

## 2016-08-17 DIAGNOSIS — R918 Other nonspecific abnormal finding of lung field: Secondary | ICD-10-CM | POA: Insufficient documentation

## 2016-08-17 DIAGNOSIS — C786 Secondary malignant neoplasm of retroperitoneum and peritoneum: Secondary | ICD-10-CM | POA: Diagnosis not present

## 2016-08-17 MED ORDER — IOPAMIDOL (ISOVUE-300) INJECTION 61%
30.0000 mL | Freq: Once | INTRAVENOUS | Status: AC | PRN
  Administered 2016-08-17: 30 mL via ORAL

## 2016-08-17 MED ORDER — IOPAMIDOL (ISOVUE-300) INJECTION 61%
INTRAVENOUS | Status: AC
  Filled 2016-08-17: qty 100

## 2016-08-17 MED ORDER — IOPAMIDOL (ISOVUE-300) INJECTION 61%
100.0000 mL | Freq: Once | INTRAVENOUS | Status: AC | PRN
  Administered 2016-08-17: 100 mL via INTRAVENOUS

## 2016-08-17 MED ORDER — IOPAMIDOL (ISOVUE-300) INJECTION 61%
INTRAVENOUS | Status: AC
  Filled 2016-08-17: qty 30

## 2016-08-18 ENCOUNTER — Telehealth: Payer: Self-pay | Admitting: Hematology

## 2016-08-18 ENCOUNTER — Ambulatory Visit: Payer: Managed Care, Other (non HMO) | Admitting: Hematology

## 2016-08-18 ENCOUNTER — Ambulatory Visit (HOSPITAL_BASED_OUTPATIENT_CLINIC_OR_DEPARTMENT_OTHER): Payer: Managed Care, Other (non HMO) | Admitting: Hematology

## 2016-08-18 ENCOUNTER — Other Ambulatory Visit: Payer: Managed Care, Other (non HMO)

## 2016-08-18 ENCOUNTER — Ambulatory Visit: Payer: Managed Care, Other (non HMO)

## 2016-08-18 ENCOUNTER — Other Ambulatory Visit (HOSPITAL_BASED_OUTPATIENT_CLINIC_OR_DEPARTMENT_OTHER): Payer: Managed Care, Other (non HMO)

## 2016-08-18 ENCOUNTER — Ambulatory Visit (HOSPITAL_BASED_OUTPATIENT_CLINIC_OR_DEPARTMENT_OTHER): Payer: Managed Care, Other (non HMO)

## 2016-08-18 VITALS — BP 124/65 | HR 97 | Temp 98.2°F | Resp 18 | Ht 61.0 in | Wt 124.8 lb

## 2016-08-18 DIAGNOSIS — Z5111 Encounter for antineoplastic chemotherapy: Secondary | ICD-10-CM | POA: Diagnosis not present

## 2016-08-18 DIAGNOSIS — D500 Iron deficiency anemia secondary to blood loss (chronic): Secondary | ICD-10-CM

## 2016-08-18 DIAGNOSIS — Z5112 Encounter for antineoplastic immunotherapy: Secondary | ICD-10-CM

## 2016-08-18 DIAGNOSIS — D509 Iron deficiency anemia, unspecified: Secondary | ICD-10-CM

## 2016-08-18 DIAGNOSIS — C786 Secondary malignant neoplasm of retroperitoneum and peritoneum: Secondary | ICD-10-CM

## 2016-08-18 DIAGNOSIS — M791 Myalgia: Secondary | ICD-10-CM

## 2016-08-18 DIAGNOSIS — C182 Malignant neoplasm of ascending colon: Secondary | ICD-10-CM

## 2016-08-18 DIAGNOSIS — R911 Solitary pulmonary nodule: Secondary | ICD-10-CM | POA: Diagnosis not present

## 2016-08-18 DIAGNOSIS — R109 Unspecified abdominal pain: Secondary | ICD-10-CM

## 2016-08-18 DIAGNOSIS — F329 Major depressive disorder, single episode, unspecified: Secondary | ICD-10-CM

## 2016-08-18 LAB — CBC WITH DIFFERENTIAL/PLATELET
BASO%: 0.7 % (ref 0.0–2.0)
Basophils Absolute: 0 10*3/uL (ref 0.0–0.1)
EOS%: 2.8 % (ref 0.0–7.0)
Eosinophils Absolute: 0.2 10*3/uL (ref 0.0–0.5)
HCT: 30.6 % — ABNORMAL LOW (ref 34.8–46.6)
HGB: 9.7 g/dL — ABNORMAL LOW (ref 11.6–15.9)
LYMPH%: 27.1 % (ref 14.0–49.7)
MCH: 25.8 pg (ref 25.1–34.0)
MCHC: 31.7 g/dL (ref 31.5–36.0)
MCV: 81.4 fL (ref 79.5–101.0)
MONO#: 0.5 10*3/uL (ref 0.1–0.9)
MONO%: 8.4 % (ref 0.0–14.0)
NEUT%: 61 % (ref 38.4–76.8)
NEUTROS ABS: 3.5 10*3/uL (ref 1.5–6.5)
PLATELETS: 271 10*3/uL (ref 145–400)
RBC: 3.76 10*6/uL (ref 3.70–5.45)
RDW: 21.9 % — ABNORMAL HIGH (ref 11.2–14.5)
WBC: 5.7 10*3/uL (ref 3.9–10.3)
lymph#: 1.6 10*3/uL (ref 0.9–3.3)

## 2016-08-18 LAB — COMPREHENSIVE METABOLIC PANEL
ALBUMIN: 3.3 g/dL — AB (ref 3.5–5.0)
ALK PHOS: 129 U/L (ref 40–150)
ALT: 12 U/L (ref 0–55)
AST: 20 U/L (ref 5–34)
Anion Gap: 8 mEq/L (ref 3–11)
BILIRUBIN TOTAL: 0.24 mg/dL (ref 0.20–1.20)
BUN: 10.3 mg/dL (ref 7.0–26.0)
CALCIUM: 9.8 mg/dL (ref 8.4–10.4)
CO2: 25 mEq/L (ref 22–29)
Chloride: 106 mEq/L (ref 98–109)
Creatinine: 0.6 mg/dL (ref 0.6–1.1)
GLUCOSE: 82 mg/dL (ref 70–140)
POTASSIUM: 4.4 meq/L (ref 3.5–5.1)
SODIUM: 139 meq/L (ref 136–145)
TOTAL PROTEIN: 6.8 g/dL (ref 6.4–8.3)

## 2016-08-18 LAB — CEA (IN HOUSE-CHCC): CEA (CHCC-IN HOUSE): 14.27 ng/mL — AB (ref 0.00–5.00)

## 2016-08-18 MED ORDER — DEXAMETHASONE SODIUM PHOSPHATE 10 MG/ML IJ SOLN
INTRAMUSCULAR | Status: AC
  Filled 2016-08-18: qty 1

## 2016-08-18 MED ORDER — SODIUM CHLORIDE 0.9 % IV SOLN: Freq: Once | INTRAVENOUS | Status: DC

## 2016-08-18 MED ORDER — ATROPINE SULFATE 1 MG/ML IJ SOLN: 0.5000 mg | Freq: Once | INTRAMUSCULAR | Status: DC | PRN

## 2016-08-18 MED ORDER — OXYCODONE HCL 10 MG PO TABS: 10.0000 mg | ORAL_TABLET | ORAL | 0 refills | Status: DC

## 2016-08-18 MED ORDER — ALPRAZOLAM 0.25 MG PO TABS: 0.2500 mg | ORAL_TABLET | Freq: Every evening | ORAL | 0 refills | Status: DC | PRN

## 2016-08-18 MED ORDER — SODIUM CHLORIDE 0.9 % IV SOLN
Freq: Once | INTRAVENOUS | Status: AC
  Administered 2016-08-18: 14:00:00 via INTRAVENOUS

## 2016-08-18 MED ORDER — PALONOSETRON HCL INJECTION 0.25 MG/5ML
0.2500 mg | Freq: Once | INTRAVENOUS | Status: AC
  Administered 2016-08-18: 0.25 mg via INTRAVENOUS

## 2016-08-18 MED ORDER — PALONOSETRON HCL INJECTION 0.25 MG/5ML
INTRAVENOUS | Status: AC
  Filled 2016-08-18: qty 5

## 2016-08-18 MED ORDER — SODIUM CHLORIDE 0.9 % IJ SOLN
10.0000 mL | INTRAMUSCULAR | Status: DC | PRN
  Administered 2016-08-18: 10 mL
  Filled 2016-08-18: qty 10

## 2016-08-18 MED ORDER — IRINOTECAN HCL CHEMO INJECTION 100 MG/5ML
182.0000 mg/m2 | Freq: Once | INTRAVENOUS | Status: AC
  Administered 2016-08-18: 300 mg via INTRAVENOUS
  Filled 2016-08-18: qty 15

## 2016-08-18 MED ORDER — IRINOTECAN HCL CHEMO INJECTION 100 MG/5ML: 180.0000 mg/m2 | Freq: Once | INTRAVENOUS | Status: DC

## 2016-08-18 MED ORDER — SODIUM CHLORIDE 0.9 % IV SOLN
5.0000 mg/kg | Freq: Once | INTRAVENOUS | Status: AC
  Administered 2016-08-18: 300 mg via INTRAVENOUS
  Filled 2016-08-18: qty 12

## 2016-08-18 MED ORDER — DEXAMETHASONE SODIUM PHOSPHATE 10 MG/ML IJ SOLN
10.0000 mg | Freq: Once | INTRAMUSCULAR | Status: AC
  Administered 2016-08-18: 10 mg via INTRAVENOUS

## 2016-08-18 MED ORDER — SODIUM CHLORIDE 0.9 % IV SOLN
2400.0000 mg/m2 | INTRAVENOUS | Status: DC
  Administered 2016-08-18: 3850 mg via INTRAVENOUS
  Filled 2016-08-18: qty 77

## 2016-08-18 MED ORDER — LEUCOVORIN CALCIUM INJECTION 350 MG
400.0000 mg/m2 | Freq: Once | INTRAVENOUS | Status: AC
  Administered 2016-08-18: 640 mg via INTRAVENOUS
  Filled 2016-08-18: qty 32

## 2016-08-18 NOTE — Telephone Encounter (Signed)
Appointments complete per 1/25 los. Patient to get printout in infusion area.

## 2016-08-18 NOTE — Patient Instructions (Signed)
[facility] Discharge Instructions for Patients Receiving Chemotherapy  Today you received the following chemotherapy agents: Avastin, Irinotecan, Leucovorin and Adrucil   To help prevent nausea and vomiting after your treatment, we encourage you to take your nausea medication as directed.    If you develop nausea and vomiting that is not controlled by your nausea medication, call the clinic.   BELOW ARE SYMPTOMS THAT SHOULD BE REPORTED IMMEDIATELY:  *FEVER GREATER THAN 100.5 F  *CHILLS WITH OR WITHOUT FEVER  NAUSEA AND VOMITING THAT IS NOT CONTROLLED WITH YOUR NAUSEA MEDICATION  *UNUSUAL SHORTNESS OF BREATH  *UNUSUAL BRUISING OR BLEEDING  TENDERNESS IN MOUTH AND THROAT WITH OR WITHOUT PRESENCE OF ULCERS  *URINARY PROBLEMS  *BOWEL PROBLEMS  UNUSUAL RASH Items with * indicate a potential emergency and should be followed up as soon as possible.  Feel free to call the clinic you have any questions or concerns. The clinic phone number is [phone].  Please show the CHEMO ALERT CARD at check-in to the Emergency Department and triage nurse.   

## 2016-08-18 NOTE — Progress Notes (Signed)
OK to increase rate on CADD pump/5FU to run in over 44 hours instead of 46 per Dr Burr Medico.  Order repeated & verified.

## 2016-08-18 NOTE — Patient Instructions (Signed)

## 2016-08-19 LAB — CEA: CEA1: 16.2 ng/mL — AB (ref 0.0–4.7)

## 2016-08-20 ENCOUNTER — Encounter: Payer: Self-pay | Admitting: Hematology

## 2016-08-20 ENCOUNTER — Ambulatory Visit (HOSPITAL_BASED_OUTPATIENT_CLINIC_OR_DEPARTMENT_OTHER): Payer: Self-pay

## 2016-08-20 VITALS — BP 144/92 | HR 71 | Temp 98.4°F | Resp 18

## 2016-08-20 DIAGNOSIS — C182 Malignant neoplasm of ascending colon: Secondary | ICD-10-CM

## 2016-08-20 MED ORDER — SODIUM CHLORIDE 0.9% FLUSH
10.0000 mL | INTRAVENOUS | Status: DC | PRN
  Administered 2016-08-20: 10 mL
  Filled 2016-08-20: qty 10

## 2016-08-20 MED ORDER — HEPARIN SOD (PORK) LOCK FLUSH 100 UNIT/ML IV SOLN
500.0000 [IU] | Freq: Once | INTRAVENOUS | Status: AC | PRN
  Administered 2016-08-20: 500 [IU]
  Filled 2016-08-20: qty 5

## 2016-08-20 NOTE — Patient Instructions (Signed)

## 2016-08-30 NOTE — Progress Notes (Signed)
[place]  [phone] [phone]  Clinic follow up Note   Patient Care Team: Carol Ada, MD as PCP - General (Family Medicine) Rolm Bookbinder, MD as Consulting Physician (General Surgery) 09/01/2016   CHIEF COMPLAINTS:  Follow up metastatic colon cancer   Oncology History   Cancer of right colon [facility])   Staging form: Colon and Rectum, AJCC 7th Edition     Pathologic stage from 08/03/2015: Stage IIIB (T4a, N1a, cM0) - Signed by Truitt Merle, MD on 09/04/2015       Cancer of right colon ([place])   03/31/2015 Imaging    CT abdomen showed appendicitis, perforated, with periappendiceal abscess. And small pulmonary nodules.      04/03/2015 Procedure    CT guided placement of going Through into the right lower abdominal quadrant with aspiration of a total of 30 mL prudent fluid.       08/03/2015 Initial Diagnosis    Cancer of right colon ([place])      08/03/2015 Surgery    Right hemicolectomy      08/03/2015 Pathology Results    Right hemicolectomy showed invasive adenocarcinoma, moderately differentiated, likely arising from a cecal valve. One out of 40 lymph nodes were positive, (+) LVI, margins negative       08/03/2015 Miscellaneous    colon cance MSI-stable       09/18/2015 - 03/31/2016 Chemotherapy    Adjuvant CAPOX, she tolerated oxaliplatin poorly, which was held from cycle 2 to 4 due to poor tolerance, restarted from cycle 5 with lower dose, she also had significant hand-foot syndrome from Xeloda. She completed a total of 8 cycles.      05/20/2016 Imaging    PET scan revealed hypermetabolic peritoneal carcinomatosis, five scatted pulmonary nodules in both lungs, largest 4 mm, metastasis not excluded. Hypermetabolic mass in the deep subcutaneous ventral right lateral pelvic wall, probable metastasis.      06/02/2016 -  Chemotherapy    FOLFIRI every 2 weeks, started on 06/02/2016, Avastin added from cycle 3       06/26/2016  Miscellaneous    Foundation One revealed KRAS mutation (+), MSI-stable       08/17/2016 Imaging    CT CAP IMPRESSION: 1. Interval progression of peritoneal carcinomatosis with progressive scalloping of the liver and increase in size of the peritoneal masses. 2. Multiple pulmonary nodules are again noted. All these remain stable with the exception of a left upper lobe nodule which has increased to 5 mm from 3 mm previously.       HISTORY OF PRESENTING ILLNESS:  [name] 51 y.o. female without significant PMH is here because of her recently diagnosed stage III colon cancer. She is accompanied by her boyfriend to the clinic today.  She developed fever and headache in September 2016, was found to have ruptured appendix and was admitted to hospital. CT scan revealed a peri-appendiceal abscess. She underwent abscess drainage tube basement by interventional radiology on 04/17/2015, and a course of antibiotics. She had a multiple drainage tube exchange by IR, but the abscess did not heal. She was referred to Dr. Donne [name]. Multiple CT scan in October and November showed persistent peri-appendiceal fluid collection. She was finally taken to the operation room on 08/03/2015. Intraoperatively, she was found to have a 2 cm whole present in the cecum, she underwent ileocectomy with an anastomosis in the hepatic fixture. 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  She had wound infection issue after  surgery. It has been healing slowly, she is still on wet-to-dry dressing change twice daily, has home care nurse. She has moderate pain, 6-7/10, sometime postional, she is on oxycodone 2-3 times a day. Her appetite and eating is getting better, lost about 25lb since 03/2015, no fever and chills for the past 3-4 days, just finished abx yesterday.   CURRENT THERAPY: chemotherapy FOLFIRI every 2 weeks starting on 06/02/2016, Avastin added on cycle 3   INTERIM HISTORY:  Marquesha returns for  follow-up and 4th cycle chemo. She didn't have many problems with her last cycle of chemo. She was constipated, bloated, and in some pain this past week. She took some magnesium and had a bowel movement. She is going to start taking a stool softener regularly. She is still very fatigued, but she has been eating very well. Even with eating a lot, she still lost 0.5 lb. She is moving this weekend so she is unsure if some of what she is feeling is related to this stressor. Denies any other concerns.   MEDICAL HISTORY:  Past Medical History:  Diagnosis Date  . Anemia   . Anxiety   . Depression   . Fibromyalgia   . GERD (gastroesophageal reflux disease)   . JP drain bleeding     SURGICAL HISTORY: Past Surgical History:  Procedure Laterality Date  . ABDOMINAL HYSTERECTOMY    . COLON SURGERY  08/03/2015  . COLOSTOMY REVISION N/A 08/03/2015   Procedure: COLON RESECTION RIGHT;  Surgeon: Rolm Bookbinder, MD;  Location: [place];  Service: General;  Laterality: N/A;  . GASTRIC BYPASS    . IR GENERIC HISTORICAL  05/30/2016   IR FLUORO GUIDE PORT INSERTION RIGHT 05/30/2016 Aletta Edouard, MD [facility]-INTERV RAD  . IR GENERIC HISTORICAL  05/30/2016   IR US GUIDE VASC ACCESS RIGHT 05/30/2016 Aletta Edouard, MD [facility]INTER[facility]  . LAPAROSCOPIC ILEOCECECTOMY Right 08/03/2015   Procedure: LAPAROSCOPIC DIAGNOSTIC RIGHT COLECTOMY;  Surgeon: Rolm Bookbinder, MD;  Location: [place];  Service: General;  Laterality: Right;    SOCIAL HISTORY: Social History   Social History  . Marital status: Significant Other    Spouse name: N/A  . Number of children: N/A  . Years of education: N/A   Occupational History  . Not on file.   Social History Main Topics  . Smoking status: Never Smoker  . Smokeless tobacco: Never Used  . Alcohol use 1.2 oz/week    2 Glasses of wine per week     Comment: moderate 1-2 times a month   . Drug use: No  . Sexual activity: Not on file   Other Topics Concern  . Not on file   Social  History Narrative   Single, fiance [name]   Works [employer] 11:30am to 8 pm    FAMILY HISTORY: Family History  Problem Relation Age of Onset  . Cancer Father 76    small cell lung cancer and colon cancer   . Cancer Sister 50    small cell lung cancer  . Cancer Maternal Aunt     ovarian cancer  . Cancer Paternal Uncle     brain cancer   . Cancer Other 40    ovarian cancer (maternal niece)    ALLERGIES:  is allergic to latex; cymbalta [duloxetine hcl]; and penicillins.  MEDICATIONS:  Current Outpatient Prescriptions  Medication Sig Dispense Refill  . acetaminophen (TYLENOL) 500 MG tablet Take 1,000 mg by mouth every 6 (six) hours as needed for fever. Reported on  09/24/2015    . ALPRAZolam (XANAX) 0.25 MG tablet Take 1-2 tablets (0.25-0.5 mg total) by mouth at bedtime as needed for anxiety or sleep. 60 tablet 0  . bisacodyl (DULCOLAX) 5 MG EC tablet Take 10 mg by mouth daily as needed for moderate constipation.    . cyclobenzaprine (FLEXERIL) 10 MG tablet Take 1 tablet (10 mg total) by mouth 3 (three) times daily as needed for muscle spasms. 60 tablet 0  . dexamethasone (DECADRON) 4 MG tablet Take 2 tablets (8 mg total) by mouth daily. Start day after chemo and take for 3 days after each IV chemo 30 tablet 1  . diphenhydrAMINE (BENADRYL) 25 mg capsule Take 25 mg by mouth every 6 (six) hours as needed for sleep.    Marland Kitchen docusate sodium (COLACE) 100 MG capsule Take 300 mg by mouth daily.    Marland Kitchen escitalopram (LEXAPRO) 10 MG tablet Take 10 mg by mouth daily.    . ferrous sulfate 325 (65 FE) MG tablet Take 325 mg by mouth 2 (two) times daily with a meal. Reported on 10/12/2015    . lidocaine-prilocaine (EMLA) cream Apply 1 application topically as needed. 30 g 2  . ondansetron (ZOFRAN) 8 MG tablet Take 1 tablet (8 mg total) by mouth 2 (two) times daily as needed for refractory nausea / vomiting. Start on day 3 after chemotherapy. 30 tablet 1  . Oxycodone HCl 10 MG TABS Take 1  tablet (10 mg total) by mouth every 4 (four) hours. 120 tablet 0  . prochlorperazine (COMPAZINE) 10 MG tablet Take 1 tablet (10 mg total) by mouth every 6 (six) hours as needed (NAUSEA). 30 tablet 1  . traMADol (ULTRAM) 50 MG tablet Take 1-2 tablets (50-100 mg total) by mouth every 6 (six) hours as needed. 60 tablet 0  . urea (CARMOL) 10 % cream Apply topically as needed. 71 g 1  . vitamin B-12 (CYANOCOBALAMIN) 1000 MCG tablet Take 500 mcg by mouth daily. Reported on 09/24/2015    . vitamin C (ASCORBIC ACID) 500 MG tablet Take 500 mg by mouth daily.     No current facility-administered medications for this visit.    Facility-Administered Medications Ordered in Other Visits  Medication Dose Route Frequency Provider Last Rate Last Dose  . clindamycin (CLEOCIN) 900 mg in dextrose 5 % 50 mL IVPB  900 mg Intravenous 60 min Pre-Op Rolm Bookbinder, MD       And  . gentamicin (GARAMYCIN) 310 mg in dextrose 5 % 50 mL IVPB  5 mg/kg Intravenous 60 min Pre-Op Rolm Bookbinder, MD      . sodium chloride 0.9 % injection 10 mL  10 mL Intracatheter PRN Truitt Merle, MD   10 mL at 06/23/16 1139  . sodium chloride flush (NS) 0.9 % injection 10 mL  10 mL Intracatheter PRN Truitt Merle, MD   10 mL at 06/04/16 1301    REVIEW OF SYSTEMS:  Constitutional: Denies fevers, chills or abnormal night sweats. (+) fatigue Eyes: Denies blurriness of vision, double vision or watery eyes Ears, nose, mouth, throat, and face: Denies mucositis or sore throat Respiratory: Denies cough, dyspnea or wheezes Cardiovascular: Denies palpitation, chest discomfort or lower extremity swelling Gastrointestinal:  Denies heartburn. (+) constipation, swelling in abdomen, abdominal pain Skin: Denies abnormal skin rashes Lymphatics: Denies new lymphadenopathy or easy bruising  Neurological:Denies numbness, tingling or new weaknesses Behavioral/Psych: Mood is stable, no new changes  All other systems were reviewed with the patient and are  negative.  PHYSICAL EXAMINATION:  ECOG PERFORMANCE STATUS: 1 - Symptomatic but completely ambulatory  Vitals:   09/01/16 1214  BP: 130/90  Pulse: 83  Resp: 18  Temp: 98.1 F (36.7 C)   Filed Weights   09/01/16 1214  Weight: 123 lb 14.4 oz (56.2 kg)    GENERAL:alert, no distress and comfortable SKIN: skin color, texture, turgor are normal, no rashes or significant lesions EYES: normal, conjunctiva are pink and non-injected, sclera clear OROPHARYNX:no exudate, no erythema and lips, buccal mucosa, and tongue normal  NECK: supple, thyroid normal size, non-tender, without nodularity LYMPH:  no palpable lymphadenopathy in the cervical, axillary or inguinal   LUNGS: clear to auscultation and percussion with normal breathing effort HEART: regular rate & rhythm and no murmurs and no lower extremity edema ABDOMEN:abdomen soft, normal bowel sounds,  (+) Large midline surgical wound has completely healed, and appears to be dry without any discharge. 2 x 3 cm palpable mass in the right upper quadrant of the abdomen. A little tender, Diffuse tenderness mainly in epigastric area Musculoskeletal:no cyanosis of digits and no clubbing  PSYCH: alert & oriented x 3 with fluent speech NEURO: no focal motor/sensory deficits  LABORATORY DATA:  I have reviewed the data as listed CBC Latest Ref Rng & Units 09/01/2016 08/18/2016 08/04/2016  WBC 3.9 - 10.3 10e3/uL 5.0 5.7 4.6  Hemoglobin 11.6 - 15.9 g/dL 10.1(L) 9.7(L) 9.1(L)  Hematocrit 34.8 - 46.6 % 31.9(L) 30.6(L) 28.2(L)  Platelets 145 - 400 10e3/uL 272 271 297   CMP Latest Ref Rng & Units 09/01/2016 08/18/2016 08/04/2016  Glucose 70 - 140 mg/dl 86 82 86  BUN 7.0 - 26.0 mg/dL 7.1 10.3 7.9  Creatinine 0.6 - 1.1 mg/dL 0.6 0.6 0.6  Sodium 136 - 145 mEq/L 138 139 141  Potassium 3.5 - 5.1 mEq/L 4.5 4.4 3.8  Chloride 101 - 111 mmol/L - - -  CO2 22 - 29 mEq/L 26 25 25   Calcium 8.4 - 10.4 mg/dL 10.0 9.8 9.1  Total Protein 6.4 - 8.3 g/dL 7.0 6.8 6.6  Total  Bilirubin 0.20 - 1.20 mg/dL <0.22 0.24 0.23  Alkaline Phos 40 - 150 U/L 122 129 118  AST 5 - 34 U/L 16 20 17   ALT 0 - 55 U/L 15 12 10    CEA 02/04/2016: 2.5 03/18/2016: 6.84 06/02/2016: 21.73 08/18/2016: 14.27  PATHOLOGY REPORT  05/30/2016 Diagnosis Peritoneum, biopsy, left lower quadrant mesentery ADENOCARCINOMA WITH ABUNDANT EXTRACELLULAR MUCIN CONSISTENT WITH COLONIC PRIMARY   FOUNDATION ONE [id number] 05/30/2016   Diagnosis 08/03/2015 Colon, segmental resection, Right - INVASIVE ADENOCARCINOMA WITH ABUNDANT EXTRACELLULAR MUCIN, MODERATELY DIFFERENTIATED, LIKELY ARISING FROM ILEOCECAL VALVE. - ADENOCARCINOMA EXTENDS AT LEAST INTO THE PERICOLONIC SOFT TISSUE AND IS ASSOCIATED WITH TRANSMURAL DEFECT. - THE PROXIMAL AND DISTAL RESECTION MARGINS ARE NEGATIVE FOR ADENOCARCINOMA. - ACELLULAR MUCIN IS GROSSLY PRESENT AT THE SEROSAL SURFACE. - LYMPHOVASCULAR INVASION IS IDENTIFIED. - METASTATIC CARCINOMA IN 1 OF 40 LYMPH NODES (1/40). - SEE ONCOLOGY TABLE BELOW. Microscopic Comment COLON AND RECTUM (INCLUDING TRANS-ANAL RESECTION): Specimen: Right colon and terminal ileum. Procedure: Resection. Tumor site: Likely ileocecal valve. Specimen integrity: Transmural defect(s). Macroscopic intactness of mesorectum: N/A Macroscopic tumor perforation: Present. Invasive tumor: Maximum size: At least 2.5 cm. Histologic type(s): Adenocarcinoma with abundant extracellular mucin. Histologic grade and differentiation: G2: moderately differentiated. Type of polyp in which invasive carcinoma arose: Tubular adenoma. Microscopic extension of invasive tumor: Adenocarcinoma extends into pericolonic soft tissue and acellular mucin is grossly present at the serosal surface. Lymph-Vascular invasion: Present. Peri-neural invasion: Not identified. Tumor deposit(s) (discontinuous  extramural extension): Not identified. Resection margins: Proximal margin: Negative for carcinoma. Distal margin: Negative for  carcinoma. Circumferential (radial) (posterior ascending, posterior descending; lateral and posterior mid-rectum; and entire lower 1/3 rectum): Acellular mucin is present at the circumferential tissue edge. Treatment effect (neo-adjuvant therapy): N/A Additional polyp(s): Not identified. Non-neoplastic findings: No significant findings. Lymph nodes: number examined 40; number positive: 1 Pathologic Staging: at least pT4a, pN1a. Ancillary studies: MMR by IHC and MSI by PCR will be performed and the results reported separately. Additional studies can be performed upon clinician request.   ADDITIONAL INFORMATION: Mismatch Repair (MMR) Protein Immunohistochemistry (IHC) IHC Expression Result: MLH1: EQUIVOCAL MSH2: EQUIVOCAL MSH6: EQUIVOCAL PMS2: EQUIVOCAL * Internal control demonstrates intact nuclear expression Interpretation: EQUIVOCAL The tumor shows partial staining with all anibodies and hence the results are considered equivocal. Correlated with molecular based MSI testing is strongly recommended.    RADIOGRAPHIC STUDIES: I have personally reviewed the radiological images as listed and agreed with the findings in the report.  CT CAP 08/17/2016 IMPRESSION: 1. Interval progression of peritoneal carcinomatosis with progressive scalloping of the liver and increase in size of the peritoneal masses. 2. Multiple pulmonary nodules are again noted. All these remain stable with the exception of a left upper lobe nodule which has increased to 5 mm from 3 mm previously.  NM PET 05/20/2016 IMPRESSION: 1. Peritoneal carcinomatosis with hypermetabolic peritoneal masses throughout the perihepatic space, left upper quadrant, omentum, mesenteric and pelvic peritoneum. Small volume pelvic ascites. 2. Hypermetabolic mass in the deep subcutaneous ventral right lateral pelvic wall, probably a metastasis. 3. Five scattered pulmonary nodules in both lungs, largest 4 mm, below PET resolution,  two of which in the upper lobes are new since 09/03/2015, cannot exclude pulmonary metastases. 4. Nonenlarged mildly hypermetabolic left axillary lymph node, nonspecific, favor benign injection related uptake (radiotracer was injected in a left hand peripheral IV). 5. Additional findings include aortic atherosclerosis and 2 vessel coronary atherosclerosis.  ASSESSMENT & PLAN:  51 y.o. Caucasian female, with past medical history of fibromyalgia, depression, presented with ruptured cecum and pericolonic abscess, required multiple drainage, antibiotics, and eventually right colectomy.  1. Right colon cancer, cecum, moderately differentiated adenocarcinoma, pT4N1aM0, stage IIIA, with perforation, MSI-stable, KRAS mutation (+), peritoneum recurrence - I previously reviewed her scan findings, and surgical pathology results in great details with patient and her Husband.  - she had locally advanced stage III  Disease, with particular high risk features of T4 tumor, proliferation for 3-4 months,  And positive lymph nodes, she is at very high risk for cancer recurrence, especially peritoneal carcinomatosis. -She has completed adjuvant chemotherapy CAPOX, oxaliplatin dose was significantly reduced due to her poor tolerance. -I previously discussed her surveillance CT scan from 04/29/2016, which unfortunately showed probable peritoneal carcinomatosis. No other significant metastasis on the CT scan. We discussed that the metastasis is not definitive based on CT, and needs to be confirmed by biopsy  -I previously reviewed her PET scan from 05/20/16, which showed hypermetabolic peritoneal carcinomatosis, in a few small lung nodules which are indeterminate. -She has started chemotherapy FOLFIRI and Avastin, tolerating well, and her abdominal pain has improved. 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 -The  goal of chemotherapy is palliative. -I previously reviewed her restaging CT scan from 08/18/2016, and compared to her previous CT scan from 04/29/2016, and PET scan from 05/20/2016 (reporting radiologist only compared to the CT). She did have disease progression in  the peritoneum compared to the prior CT scan, however overall stable or slight progression when compared to the PET scan (although it's non-contrast CT). She is clinically doing moderately well, pain has improved, CEA has decreased since she started chemotherapy, I'll continue current chemotherapy, and we'll obtain a close follow-up scan in 6-8 weeks, to determine if we need to switch her chemotherapy regimen. -Lab reviewed, adequate for treatment, we'll proceed cycle 7 chemotherapy today. -I have encouraged her to cut back on her wine drinking.  -repeat staging scan after cycle 9   2. Abdominal pain -Likely related to her cancer recurrence -Improved since she started chemotherapy -Continue oxycodone 10 mg pills as needed, about 4-5 a day. I will think about putting her on long acting pain medication if this continues. -The pain does not wake her up at night.   3. Anemia, iron deficient anemia  - likely secondary to surgery,  Chronic infection and GI bleeding from the tumor -Previous Iron study showed low serum iron, transferrin saturation and ferritin 12, which is consistent with iron deficiency -Due to her significant worsening anemia, hemoglobin 8.7 on 06/23/16, I recommendeded IV Feraheme weekly twice . Her anemia has approved some -We'll consider blood transfusion if hemoglobin less than 8 or symptomatic anemia with hemoglobin 8-9 -She will continue oral iron  4. Fibromyalgia and depression - she will continue medication, and follow-up with her primary care physician  5. Constipation -She will start taking a stool softener and laxative senna-s every day.    Plan -7th cycle of treatment today, continue every 2 weeks -I will  see her again in 2 weeks for follow up and treatment.   All questions were answered. The patient knows to call the clinic with any problems, questions or concerns.  I spent 20 minutes counseling the patient face to face. The total time spent in the appointment was 25 minutes and more than 50% was on counseling.   This document serves as a record of services personally performed by Truitt Merle, MD. It was created on her behalf by Martinique Casey, a trained medical scribe. The creation of this record is based on the scribe's personal observations and the provider's statements to them. This document has been checked and approved by the attending provider.  I have reviewed the above documentation for accuracy and completeness, and I agree with the above information.       Truitt Merle, MD 09/01/2016

## 2016-09-01 ENCOUNTER — Encounter: Payer: Self-pay | Admitting: Hematology

## 2016-09-01 ENCOUNTER — Ambulatory Visit (HOSPITAL_BASED_OUTPATIENT_CLINIC_OR_DEPARTMENT_OTHER): Payer: Managed Care, Other (non HMO) | Admitting: Hematology

## 2016-09-01 ENCOUNTER — Other Ambulatory Visit (HOSPITAL_BASED_OUTPATIENT_CLINIC_OR_DEPARTMENT_OTHER): Payer: Managed Care, Other (non HMO)

## 2016-09-01 ENCOUNTER — Ambulatory Visit (HOSPITAL_BASED_OUTPATIENT_CLINIC_OR_DEPARTMENT_OTHER): Payer: Managed Care, Other (non HMO)

## 2016-09-01 ENCOUNTER — Ambulatory Visit: Payer: Managed Care, Other (non HMO)

## 2016-09-01 VITALS — BP 130/90 | HR 83 | Temp 98.1°F | Resp 18 | Ht 61.0 in | Wt 123.9 lb

## 2016-09-01 DIAGNOSIS — Z5112 Encounter for antineoplastic immunotherapy: Secondary | ICD-10-CM

## 2016-09-01 DIAGNOSIS — M791 Myalgia: Secondary | ICD-10-CM

## 2016-09-01 DIAGNOSIS — R109 Unspecified abdominal pain: Secondary | ICD-10-CM | POA: Diagnosis not present

## 2016-09-01 DIAGNOSIS — Z5111 Encounter for antineoplastic chemotherapy: Secondary | ICD-10-CM

## 2016-09-01 DIAGNOSIS — D500 Iron deficiency anemia secondary to blood loss (chronic): Secondary | ICD-10-CM

## 2016-09-01 DIAGNOSIS — Z95828 Presence of other vascular implants and grafts: Secondary | ICD-10-CM

## 2016-09-01 DIAGNOSIS — C182 Malignant neoplasm of ascending colon: Secondary | ICD-10-CM

## 2016-09-01 DIAGNOSIS — F329 Major depressive disorder, single episode, unspecified: Secondary | ICD-10-CM

## 2016-09-01 DIAGNOSIS — K5900 Constipation, unspecified: Secondary | ICD-10-CM

## 2016-09-01 DIAGNOSIS — D509 Iron deficiency anemia, unspecified: Secondary | ICD-10-CM | POA: Diagnosis not present

## 2016-09-01 LAB — CBC WITH DIFFERENTIAL/PLATELET
BASO%: 2 % (ref 0.0–2.0)
Basophils Absolute: 0.1 10*3/uL (ref 0.0–0.1)
EOS%: 5.4 % (ref 0.0–7.0)
Eosinophils Absolute: 0.3 10*3/uL (ref 0.0–0.5)
HCT: 31.9 % — ABNORMAL LOW (ref 34.8–46.6)
HEMOGLOBIN: 10.1 g/dL — AB (ref 11.6–15.9)
LYMPH%: 39.9 % (ref 14.0–49.7)
MCH: 26.4 pg (ref 25.1–34.0)
MCHC: 31.7 g/dL (ref 31.5–36.0)
MCV: 83.3 fL (ref 79.5–101.0)
MONO#: 0.5 10*3/uL (ref 0.1–0.9)
MONO%: 10.7 % (ref 0.0–14.0)
NEUT%: 42 % (ref 38.4–76.8)
NEUTROS ABS: 2.1 10*3/uL (ref 1.5–6.5)
Platelets: 272 10*3/uL (ref 145–400)
RBC: 3.83 10*6/uL (ref 3.70–5.45)
RDW: 21 % — AB (ref 11.2–14.5)
WBC: 5 10*3/uL (ref 3.9–10.3)
lymph#: 2 10*3/uL (ref 0.9–3.3)

## 2016-09-01 LAB — COMPREHENSIVE METABOLIC PANEL
ALBUMIN: 3.4 g/dL — AB (ref 3.5–5.0)
ALK PHOS: 122 U/L (ref 40–150)
ALT: 15 U/L (ref 0–55)
ANION GAP: 7 meq/L (ref 3–11)
AST: 16 U/L (ref 5–34)
BUN: 7.1 mg/dL (ref 7.0–26.0)
CALCIUM: 10 mg/dL (ref 8.4–10.4)
CHLORIDE: 105 meq/L (ref 98–109)
CO2: 26 mEq/L (ref 22–29)
CREATININE: 0.6 mg/dL (ref 0.6–1.1)
EGFR: >90 mL/min/{1.73_m2} (ref >90)
Glucose: 86 mg/dl (ref 70–140)
Potassium: 4.5 mEq/L (ref 3.5–5.1)
Sodium: 138 mEq/L (ref 136–145)
TOTAL PROTEIN: 7 g/dL (ref 6.4–8.3)

## 2016-09-01 LAB — FERRITIN: FERRITIN: 387 ng/mL — AB (ref 9–269)

## 2016-09-01 LAB — UA PROTEIN, DIPSTICK - CHCC: Protein, ur: NEGATIVE mg/dL

## 2016-09-01 LAB — IRON AND TIBC
%SAT: 12 % — AB (ref 21–57)
Iron: 36 ug/dL — ABNORMAL LOW (ref 41–142)
TIBC: 293 ug/dL (ref 236–444)
UIBC: 256 ug/dL (ref 120–384)

## 2016-09-01 MED ORDER — SODIUM CHLORIDE 0.9 % IJ SOLN
10.0000 mL | INTRAMUSCULAR | Status: DC | PRN
  Administered 2016-09-01: 10 mL via INTRAVENOUS
  Filled 2016-09-01: qty 10

## 2016-09-01 MED ORDER — PALONOSETRON HCL INJECTION 0.25 MG/5ML
INTRAVENOUS | Status: AC
  Filled 2016-09-01: qty 5

## 2016-09-01 MED ORDER — LEUCOVORIN CALCIUM INJECTION 350 MG
400.0000 mg/m2 | Freq: Once | INTRAMUSCULAR | Status: AC
  Administered 2016-09-01: 640 mg via INTRAVENOUS
  Filled 2016-09-01: qty 32

## 2016-09-01 MED ORDER — ATROPINE SULFATE 1 MG/ML IJ SOLN
0.5000 mg | Freq: Once | INTRAMUSCULAR | Status: AC | PRN
  Administered 2016-09-01: 0.5 mg via INTRAVENOUS

## 2016-09-01 MED ORDER — PALONOSETRON HCL INJECTION 0.25 MG/5ML
0.2500 mg | Freq: Once | INTRAVENOUS | Status: AC
  Administered 2016-09-01: 0.25 mg via INTRAVENOUS

## 2016-09-01 MED ORDER — DEXAMETHASONE SODIUM PHOSPHATE 10 MG/ML IJ SOLN
INTRAMUSCULAR | Status: AC
  Filled 2016-09-01: qty 1

## 2016-09-01 MED ORDER — DEXAMETHASONE SODIUM PHOSPHATE 10 MG/ML IJ SOLN
10.0000 mg | Freq: Once | INTRAMUSCULAR | Status: AC
  Administered 2016-09-01: 10 mg via INTRAVENOUS

## 2016-09-01 MED ORDER — SODIUM CHLORIDE 0.9 % IV SOLN
2400.0000 mg/m2 | INTRAVENOUS | Status: DC
  Administered 2016-09-01: 3850 mg via INTRAVENOUS
  Filled 2016-09-01: qty 77

## 2016-09-01 MED ORDER — SODIUM CHLORIDE 0.9 % IV SOLN
Freq: Once | INTRAVENOUS | Status: AC
  Administered 2016-09-01: 14:00:00 via INTRAVENOUS

## 2016-09-01 MED ORDER — ATROPINE SULFATE 1 MG/ML IJ SOLN
INTRAMUSCULAR | Status: AC
  Filled 2016-09-01: qty 1

## 2016-09-01 MED ORDER — IRINOTECAN HCL CHEMO INJECTION 100 MG/5ML
185.0000 mg/m2 | Freq: Once | INTRAVENOUS | Status: AC
  Administered 2016-09-01: 300 mg via INTRAVENOUS
  Filled 2016-09-01: qty 15

## 2016-09-01 MED ORDER — BEVACIZUMAB CHEMO INJECTION 400 MG/16ML
5.0000 mg/kg | Freq: Once | INTRAVENOUS | Status: AC
  Administered 2016-09-01: 300 mg via INTRAVENOUS
  Filled 2016-09-01: qty 12

## 2016-09-01 MED ORDER — SODIUM CHLORIDE 0.9% FLUSH
10.0000 mL | INTRAVENOUS | Status: DC | PRN
  Filled 2016-09-01: qty 10

## 2016-09-01 NOTE — Patient Instructions (Signed)
[facility] Discharge Instructions for Patients Receiving Chemotherapy  Today you received the following chemotherapy agents: Avastin, Irinotecan, Leucovorin and Adrucil   To help prevent nausea and vomiting after your treatment, we encourage you to take your nausea medication as directed.    If you develop nausea and vomiting that is not controlled by your nausea medication, call the clinic.   BELOW ARE SYMPTOMS THAT SHOULD BE REPORTED IMMEDIATELY:  *FEVER GREATER THAN 100.5 F  *CHILLS WITH OR WITHOUT FEVER  NAUSEA AND VOMITING THAT IS NOT CONTROLLED WITH YOUR NAUSEA MEDICATION  *UNUSUAL SHORTNESS OF BREATH  *UNUSUAL BRUISING OR BLEEDING  TENDERNESS IN MOUTH AND THROAT WITH OR WITHOUT PRESENCE OF ULCERS  *URINARY PROBLEMS  *BOWEL PROBLEMS  UNUSUAL RASH Items with * indicate a potential emergency and should be followed up as soon as possible.  Feel free to call the clinic you have any questions or concerns. The clinic phone number is [phone].  Please show the CHEMO ALERT CARD at check-in to the Emergency Department and triage nurse.   

## 2016-09-03 ENCOUNTER — Ambulatory Visit (HOSPITAL_BASED_OUTPATIENT_CLINIC_OR_DEPARTMENT_OTHER): Payer: Managed Care, Other (non HMO)

## 2016-09-03 VITALS — BP 108/98 | HR 81 | Temp 98.7°F | Resp 18

## 2016-09-03 DIAGNOSIS — Z5189 Encounter for other specified aftercare: Secondary | ICD-10-CM

## 2016-09-03 DIAGNOSIS — C182 Malignant neoplasm of ascending colon: Secondary | ICD-10-CM

## 2016-09-03 MED ORDER — SODIUM CHLORIDE 0.9% FLUSH
10.0000 mL | INTRAVENOUS | Status: DC | PRN
  Administered 2016-09-03: 10 mL
  Filled 2016-09-03: qty 10

## 2016-09-03 MED ORDER — HEPARIN SOD (PORK) LOCK FLUSH 100 UNIT/ML IV SOLN
250.0000 [IU] | Freq: Once | INTRAVENOUS | Status: AC | PRN
  Administered 2016-09-03: 250 [IU]
  Filled 2016-09-03: qty 5

## 2016-09-14 NOTE — Progress Notes (Signed)
[name]  [phone] [phone]  Clinic follow up Note   Patient Care Team: Carol Ada, MD as PCP - General (Family Medicine) Rolm Bookbinder, MD as Consulting Physician (General Surgery) 09/15/2016   CHIEF COMPLAINTS:  Follow up metastatic colon cancer   Oncology History   Cancer of right colon [facility])   Staging form: Colon and Rectum, AJCC 7th Edition     Pathologic stage from 08/03/2015: Stage IIIB (T4a, N1a, cM0) - Signed by Truitt Merle, MD on 09/04/2015       Cancer of right colon ([place])   03/31/2015 Imaging    CT abdomen showed appendicitis, perforated, with periappendiceal abscess. And small pulmonary nodules.      04/03/2015 Procedure    CT guided placement of going Through into the right lower abdominal quadrant with aspiration of a total of 30 mL prudent fluid.       08/03/2015 Initial Diagnosis    Cancer of right colon ([place])      08/03/2015 Surgery    Right hemicolectomy      08/03/2015 Pathology Results    Right hemicolectomy showed invasive adenocarcinoma, moderately differentiated, likely arising from a cecal valve. One out of 40 lymph nodes were positive, (+) LVI, margins negative       08/03/2015 Miscellaneous    colon cance MSI-stable       09/18/2015 - 03/31/2016 Chemotherapy    Adjuvant CAPOX, she tolerated oxaliplatin poorly, which was held from cycle 2 to 4 due to poor tolerance, restarted from cycle 5 with lower dose, she also had significant hand-foot syndrome from Xeloda. She completed a total of 8 cycles.      05/20/2016 Imaging    PET scan revealed hypermetabolic peritoneal carcinomatosis, five scatted pulmonary nodules in both lungs, largest 4 mm, metastasis not excluded. Hypermetabolic mass in the deep subcutaneous ventral right lateral pelvic wall, probable metastasis.      06/02/2016 -  Chemotherapy    FOLFIRI every 2 weeks, started on 06/02/2016, Avastin added from cycle 3       06/26/2016  Miscellaneous    Foundation One revealed KRAS mutation (+), MSI-stable       08/17/2016 Imaging    CT CAP IMPRESSION: 1. Interval progression of peritoneal carcinomatosis with progressive scalloping of the liver and increase in size of the peritoneal masses. 2. Multiple pulmonary nodules are again noted. All these remain stable with the exception of a left upper lobe nodule which has increased to 5 mm from 3 mm previously.       HISTORY OF PRESENTING ILLNESS:  [name] 51 y.o. female without significant PMH is here because of her recently diagnosed stage III colon cancer. She is accompanied by her boyfriend to the clinic today.  She developed fever and headache in September 2016, was found to have ruptured appendix and was admitted to hospital. CT scan revealed a peri-appendiceal abscess. She underwent abscess drainage tube basement by interventional radiology on 04/17/2015, and a course of antibiotics. She had a multiple drainage tube exchange by IR, but the abscess did not heal. She was referred to Dr. Donne [name]. Multiple CT scan in October and November showed persistent peri-appendiceal fluid collection. She was finally taken to the operation room on 08/03/2015. Intraoperatively, she was found to have a 2 cm whole present in the cecum, she underwent ileocectomy with an anastomosis in the hepatic fixture. 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  She had wound infection issue after  surgery. It has been healing slowly, she is still on wet-to-dry dressing change twice daily, has home care nurse. She has moderate pain, 6-7/10, sometime postional, she is on oxycodone 2-3 times a day. Her appetite and eating is getting better, lost about 25lb since 03/2015, no fever and chills for the past 3-4 days, just finished abx yesterday.   CURRENT THERAPY: chemotherapy FOLFIRI every 2 weeks starting on 06/02/2016, Avastin added on cycle 3   INTERIM HISTORY:  [name] returns for  follow-up and 8th cycle chemo. She moved to a larger apartment recently, and has been quite fatigued due to the move. No other new complaints. Her abdominal pain stable, no significant nausea, or other new complaints. She continues working full-time.  MEDICAL HISTORY:  Past Medical History:  Diagnosis Date  . Anemia   . Anxiety   . Depression   . Fibromyalgia   . GERD (gastroesophageal reflux disease)   . JP drain bleeding     SURGICAL HISTORY: Past Surgical History:  Procedure Laterality Date  . ABDOMINAL HYSTERECTOMY    . COLON SURGERY  08/03/2015  . COLOSTOMY REVISION N/A 08/03/2015   Procedure: COLON RESECTION RIGHT;  Surgeon: Rolm Bookbinder, MD;  Location: [facility];  Service: General;  Laterality: N/A;  . GASTRIC BYPASS    . IR GENERIC HISTORICAL  05/30/2016   IR FLUORO GUIDE PORT INSERTION RIGHT 05/30/2016 Aletta Edouard, MD [facility]INTER[facility] RAD  . IR GENERIC HISTORICAL  05/30/2016   IR US GUIDE VASC ACCESS RIGHT 05/30/2016 Aletta Edouard, MD [facility]INTER[facility]  . LAPAROSCOPIC ILEOCECECTOMY Right 08/03/2015   Procedure: LAPAROSCOPIC DIAGNOSTIC RIGHT COLECTOMY;  Surgeon: Rolm Bookbinder, MD;  Location: [facility];  Service: General;  Laterality: Right;    SOCIAL HISTORY: Social History   Social History  . Marital status: Significant Other    Spouse name: N/A  . Number of children: N/A  . Years of education: N/A   Occupational History  . Not on file.   Social History Main Topics  . Smoking status: Never Smoker  . Smokeless tobacco: Never Used  . Alcohol use 1.2 oz/week    2 Glasses of wine per week     Comment: moderate 1-2 times a month   . Drug use: No  . Sexual activity: Not on file   Other Topics Concern  . Not on file   Social History Narrative   Single, fiance [name]   Works [employer] 11:30am to 8 pm    FAMILY HISTORY: Family History  Problem Relation Age of Onset  . Cancer Father 56    small cell lung cancer and colon cancer   . Cancer Sister 50     small cell lung cancer  . Cancer Maternal Aunt     ovarian cancer  . Cancer Paternal Uncle     brain cancer   . Cancer Other 40    ovarian cancer (maternal niece)    ALLERGIES:  is allergic to latex; cymbalta [duloxetine hcl]; and penicillins.  MEDICATIONS:  Current Outpatient Prescriptions  Medication Sig Dispense Refill  . acetaminophen (TYLENOL) 500 MG tablet Take 1,000 mg by mouth every 6 (six) hours as needed for fever. Reported on 09/24/2015    . ALPRAZolam (XANAX) 0.25 MG tablet Take 1-2 tablets (0.25-0.5 mg total) by mouth at bedtime as needed for anxiety or sleep. 60 tablet 0  . bisacodyl (DULCOLAX) 5 MG EC tablet Take 10 mg by mouth daily as needed for moderate constipation.    . cyclobenzaprine (FLEXERIL)  10 MG tablet Take 1 tablet (10 mg total) by mouth 3 (three) times daily as needed for muscle spasms. 60 tablet 0  . diphenhydrAMINE (BENADRYL) 25 mg capsule Take 25 mg by mouth every 6 (six) hours as needed for sleep.    Marland Kitchen docusate sodium (COLACE) 100 MG capsule Take 300 mg by mouth daily.    Marland Kitchen escitalopram (LEXAPRO) 10 MG tablet Take 10 mg by mouth daily.    . ferrous sulfate 325 (65 FE) MG tablet Take 325 mg by mouth 2 (two) times daily with a meal. Reported on 10/12/2015    . lidocaine-prilocaine (EMLA) cream Apply 1 application topically as needed. 30 g 2  . Oxycodone HCl 10 MG TABS Take 1 tablet (10 mg total) by mouth every 4 (four) hours. 120 tablet 0  . prochlorperazine (COMPAZINE) 10 MG tablet Take 1 tablet (10 mg total) by mouth every 6 (six) hours as needed (NAUSEA). 30 tablet 1  . traMADol (ULTRAM) 50 MG tablet Take 1-2 tablets (50-100 mg total) by mouth every 6 (six) hours as needed. 60 tablet 0  . urea (CARMOL) 10 % cream Apply topically as needed. 71 g 1  . vitamin B-12 (CYANOCOBALAMIN) 1000 MCG tablet Take 500 mcg by mouth daily. Reported on 09/24/2015    . vitamin C (ASCORBIC ACID) 500 MG tablet Take 500 mg by mouth daily.    Marland Kitchen dexamethasone (DECADRON) 4 MG tablet  Take 2 tablets (8 mg total) by mouth daily. Start day after chemo and take for 3 days after each IV chemo (Patient not taking: Reported on 09/15/2016) 30 tablet 1  . ondansetron (ZOFRAN) 8 MG tablet Take 1 tablet (8 mg total) by mouth 2 (two) times daily as needed for refractory nausea / vomiting. Start on day 3 after chemotherapy. (Patient not taking: Reported on 09/15/2016) 30 tablet 1   No current facility-administered medications for this visit.    Facility-Administered Medications Ordered in Other Visits  Medication Dose Route Frequency Provider Last Rate Last Dose  . clindamycin (CLEOCIN) 900 mg in dextrose 5 % 50 mL IVPB  900 mg Intravenous 60 min Pre-Op Rolm Bookbinder, MD       And  . gentamicin (GARAMYCIN) 310 mg in dextrose 5 % 50 mL IVPB  5 mg/kg Intravenous 60 min Pre-Op Rolm Bookbinder, MD      . sodium chloride 0.9 % injection 10 mL  10 mL Intracatheter PRN Truitt Merle, MD   10 mL at 06/23/16 1139  . sodium chloride flush (NS) 0.9 % injection 10 mL  10 mL Intracatheter PRN Truitt Merle, MD   10 mL at 06/04/16 1301    REVIEW OF SYSTEMS:  Constitutional: Denies fevers, chills or abnormal night sweats. (+) fatigue Eyes: Denies blurriness of vision, double vision or watery eyes Ears, nose, mouth, throat, and face: Denies mucositis or sore throat Respiratory: Denies cough, dyspnea or wheezes Cardiovascular: Denies palpitation, chest discomfort or lower extremity swelling Gastrointestinal:  Denies heartburn. (+) constipation, swelling in abdomen, abdominal pain Skin: Denies abnormal skin rashes Lymphatics: Denies new lymphadenopathy or easy bruising  Neurological:Denies numbness, tingling or new weaknesses Behavioral/Psych: Mood is stable, no new changes  All other systems were reviewed with the patient and are negative.  PHYSICAL EXAMINATION: ECOG PERFORMANCE STATUS: 1 - Symptomatic but completely ambulatory  Vitals:   09/15/16 1215  BP: 116/77  Pulse: (!) 105  Resp: 19  Temp:  98.4 F (36.9 C)   Filed Weights   09/15/16 1215  Weight: 123  lb 1.6 oz (55.8 kg)    GENERAL:alert, no distress and comfortable SKIN: skin color, texture, turgor are normal, no rashes or significant lesions EYES: normal, conjunctiva are pink and non-injected, sclera clear OROPHARYNX:no exudate, no erythema and lips, buccal mucosa, and tongue normal  NECK: supple, thyroid normal size, non-tender, without nodularity LYMPH:  no palpable lymphadenopathy in the cervical, axillary or inguinal   LUNGS: clear to auscultation and percussion with normal breathing effort HEART: regular rate & rhythm and no murmurs and no lower extremity edema ABDOMEN:abdomen soft, normal bowel sounds,  (+) Large midline surgical wound has completely healed, and appears to be dry without any discharge. 2 x 3 cm palpable mass in the right upper quadrant of the abdomen. A little tender, Diffuse tenderness mainly in epigastric area Musculoskeletal:no cyanosis of digits and no clubbing  PSYCH: alert & oriented x 3 with fluent speech NEURO: no focal motor/sensory deficits  LABORATORY DATA:  I have reviewed the data as listed CBC Latest Ref Rng & Units 09/15/2016 09/01/2016 08/18/2016  WBC 3.9 - 10.3 10e3/uL 5.8 5.0 5.7  Hemoglobin 11.6 - 15.9 g/dL 10.2(L) 10.1(L) 9.7(L)  Hematocrit 34.8 - 46.6 % 31.7(L) 31.9(L) 30.6(L)  Platelets 145 - 400 10e3/uL 250 272 271   CMP Latest Ref Rng & Units 09/15/2016 09/01/2016 08/18/2016  Glucose 70 - 140 mg/dl 88 86 82  BUN 7.0 - 26.0 mg/dL 8.7 7.1 10.3  Creatinine 0.6 - 1.1 mg/dL 0.6 0.6 0.6  Sodium 136 - 145 mEq/L 138 138 139  Potassium 3.5 - 5.1 mEq/L 4.5 4.5 4.4  Chloride 101 - 111 mmol/L - - -  CO2 22 - 29 mEq/L _0 Calcium 8.4 - 10.4 mg/dL 9.6 10.0 9.8  Total Protein 6.4 - 8.3 g/dL 6.8 7.0 6.8  Total Bilirubin 0.20 - 1.20 mg/dL <0.22 <0.22 0.24  Alkaline Phos 40 - 150 U/L 126 122 129  AST 5 - 34 U/L _1 ALT 0 - 55 U/L _2 CEA 02/04/2016: 2.5 03/18/2016:  6.84 06/02/2016: 21.73 08/18/2016: 14.27 09/15/2016: pending   PATHOLOGY REPORT  05/30/2016 Diagnosis Peritoneum, biopsy, left lower quadrant mesentery ADENOCARCINOMA WITH ABUNDANT EXTRACELLULAR MUCIN CONSISTENT WITH COLONIC PRIMARY   FOUNDATION ONE [id number] 05/30/2016   Diagnosis 08/03/2015 Colon, segmental resection, Right - INVASIVE ADENOCARCINOMA WITH ABUNDANT EXTRACELLULAR MUCIN, MODERATELY DIFFERENTIATED, LIKELY ARISING FROM ILEOCECAL VALVE. - ADENOCARCINOMA EXTENDS AT LEAST INTO THE PERICOLONIC SOFT TISSUE AND IS ASSOCIATED WITH TRANSMURAL DEFECT. - THE PROXIMAL AND DISTAL RESECTION MARGINS ARE NEGATIVE FOR ADENOCARCINOMA. - ACELLULAR MUCIN IS GROSSLY PRESENT AT THE SEROSAL SURFACE. - LYMPHOVASCULAR INVASION IS IDENTIFIED. - METASTATIC CARCINOMA IN 1 OF 40 LYMPH NODES (1/40). - SEE ONCOLOGY TABLE BELOW. Microscopic Comment COLON AND RECTUM (INCLUDING TRANS-ANAL RESECTION): Specimen: Right colon and terminal ileum. Procedure: Resection. Tumor site: Likely ileocecal valve. Specimen integrity: Transmural defect(s). Macroscopic intactness of mesorectum: N/A Macroscopic tumor perforation: Present. Invasive tumor: Maximum size: At least 2.5 cm. Histologic type(s): Adenocarcinoma with abundant extracellular mucin. Histologic grade and differentiation: G2: moderately differentiated. Type of polyp in which invasive carcinoma arose: Tubular adenoma. Microscopic extension of invasive tumor: Adenocarcinoma extends into pericolonic soft tissue and acellular mucin is grossly present at the serosal surface. Lymph-Vascular invasion: Present. Peri-neural invasion: Not identified. Tumor deposit(s) (discontinuous extramural extension): Not identified. Resection margins: Proximal margin: Negative for carcinoma. Distal margin: Negative for carcinoma. Circumferential (radial) (posterior ascending, posterior descending; lateral and posterior mid-rectum; and entire lower 1/3 rectum):  Acellular mucin is present at the  circumferential tissue edge. Treatment effect (neo-adjuvant therapy): N/A Additional polyp(s): Not identified. Non-neoplastic findings: No significant findings. Lymph nodes: number examined 40; number positive: 1 Pathologic Staging: at least pT4a, pN1a. Ancillary studies: MMR by IHC and MSI by PCR will be performed and the results reported separately. Additional studies can be performed upon clinician request.   ADDITIONAL INFORMATION: Mismatch Repair (MMR) Protein Immunohistochemistry (IHC) IHC Expression Result: MLH1: EQUIVOCAL MSH2: EQUIVOCAL MSH6: EQUIVOCAL PMS2: EQUIVOCAL * Internal control demonstrates intact nuclear expression Interpretation: EQUIVOCAL The tumor shows partial staining with all anibodies and hence the results are considered equivocal. Correlated with molecular based MSI testing is strongly recommended.    RADIOGRAPHIC STUDIES: I have personally reviewed the radiological images as listed and agreed with the findings in the report.  CT CAP w/ Contrast 08/17/2016 IMPRESSION: 1. Interval progression of peritoneal carcinomatosis with progressive scalloping of the liver and increase in size of the peritoneal masses. 2. Multiple pulmonary nodules are again noted. All these remain stable with the exception of a left upper lobe nodule which has increased to 5 mm from 3 mm previously.  NM PET 05/20/2016 IMPRESSION: 1. Peritoneal carcinomatosis with hypermetabolic peritoneal masses throughout the perihepatic space, left upper quadrant, omentum, mesenteric and pelvic peritoneum. Small volume pelvic ascites. 2. Hypermetabolic mass in the deep subcutaneous ventral right lateral pelvic wall, probably a metastasis. 3. Five scattered pulmonary nodules in both lungs, largest 4 mm, below PET resolution, two of which in the upper lobes are new since 09/03/2015, cannot exclude pulmonary metastases. 4. Nonenlarged mildly hypermetabolic  left axillary lymph node, nonspecific, favor benign injection related uptake (radiotracer was injected in a left hand peripheral IV). 5. Additional findings include aortic atherosclerosis and 2 vessel coronary atherosclerosis.  ASSESSMENT & PLAN:  51 y.o. Caucasian female, with past medical history of fibromyalgia, depression, presented with ruptured cecum and pericolonic abscess, required multiple drainage, antibiotics, and eventually right colectomy.  1. Right colon cancer, cecum, moderately differentiated adenocarcinoma, pT4N1aM0, stage IIIA, with perforation, MSI-stable, KRAS mutation (+), peritoneum recurrence - I previously reviewed her scan findings, and surgical pathology results in great details with patient and her Husband.  - she had locally advanced stage III  Disease, with particular high risk features of T4 tumor, proliferation for 3-4 months,  And positive lymph nodes, she is at very high risk for cancer recurrence, especially peritoneal carcinomatosis. -She has completed adjuvant chemotherapy CAPOX, oxaliplatin dose was significantly reduced due to her poor tolerance. -I previously discussed her surveillance CT scan from 04/29/2016, which unfortunately showed probable peritoneal carcinomatosis. No other significant metastasis on the CT scan. We discussed that the metastasis is not definitive based on CT, and needs to be confirmed by biopsy  -I previously reviewed her PET scan from 05/20/16, which showed hypermetabolic peritoneal carcinomatosis, in a few small lung nodules which are indeterminate. -She has started chemotherapy FOLFIRI and Avastin, tolerating well, and her abdominal pain has improved. 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 -The goal of chemotherapy is palliative. -she is on first line chemo with FOLFIRI and avastin, tolerating well  -Lab reviewed, adequate for  treatment, we'll proceed cycle 8 chemotherapy today. -repeat staging scan after cycle 9   2. Abdominal pain -Likely related to her cancer recurrence -Improved since she started chemotherapy -Continue oxycodone 10 mg pills as needed, about 4-5 a day. I will think about putting her on long acting pain medication if this continues. -Her  pain overall is controlled   3. Anemia, iron deficient anemia  - likely secondary to surgery,  Chronic infection and GI bleeding from the tumor -Previous Iron study showed low serum iron, transferrin saturation and ferritin 12, which is consistent with iron deficiency -Due to her significant worsening anemia, hemoglobin 8.7 on 06/23/16, I recommendeded IV Feraheme weekly twice . Her anemia has approved some -We'll consider blood transfusion if hemoglobin less than 8 or symptomatic anemia with hemoglobin 8-9 -She will continue oral iron  4. Fibromyalgia and depression - she will continue medication, and follow-up with her primary care physician  5. Constipation -She will continue stool softener and laxative senna-s every day.    Plan -8th cycle of treatment today, continue every 2 weeks -I will see her again in 4 weeks for follow up and treatment.  -restaging CT CAP w contrast before next visit   All questions were answered. The patient knows to call the clinic with any problems, questions or concerns.  I spent 20 minutes counseling the patient face to face. The total time spent in the appointment was 25 minutes and more than 50% was on counseling.      Truitt Merle, MD 09/15/2016

## 2016-09-15 ENCOUNTER — Telehealth: Payer: Self-pay | Admitting: *Deleted

## 2016-09-15 ENCOUNTER — Telehealth: Payer: Self-pay | Admitting: Hematology

## 2016-09-15 ENCOUNTER — Encounter: Payer: Self-pay | Admitting: Hematology

## 2016-09-15 ENCOUNTER — Ambulatory Visit: Payer: 59

## 2016-09-15 ENCOUNTER — Ambulatory Visit (HOSPITAL_BASED_OUTPATIENT_CLINIC_OR_DEPARTMENT_OTHER): Payer: 59 | Admitting: Hematology

## 2016-09-15 ENCOUNTER — Ambulatory Visit (HOSPITAL_BASED_OUTPATIENT_CLINIC_OR_DEPARTMENT_OTHER): Payer: 59

## 2016-09-15 ENCOUNTER — Other Ambulatory Visit (HOSPITAL_BASED_OUTPATIENT_CLINIC_OR_DEPARTMENT_OTHER): Payer: 59

## 2016-09-15 VITALS — BP 116/77 | HR 105 | Temp 98.4°F | Resp 19 | Ht 61.0 in | Wt 123.1 lb

## 2016-09-15 VITALS — HR 99

## 2016-09-15 DIAGNOSIS — C182 Malignant neoplasm of ascending colon: Secondary | ICD-10-CM

## 2016-09-15 DIAGNOSIS — Z5111 Encounter for antineoplastic chemotherapy: Secondary | ICD-10-CM | POA: Diagnosis not present

## 2016-09-15 DIAGNOSIS — F329 Major depressive disorder, single episode, unspecified: Secondary | ICD-10-CM | POA: Diagnosis not present

## 2016-09-15 DIAGNOSIS — M791 Myalgia: Secondary | ICD-10-CM | POA: Diagnosis not present

## 2016-09-15 DIAGNOSIS — D509 Iron deficiency anemia, unspecified: Secondary | ICD-10-CM

## 2016-09-15 DIAGNOSIS — R109 Unspecified abdominal pain: Secondary | ICD-10-CM | POA: Diagnosis not present

## 2016-09-15 DIAGNOSIS — Z95828 Presence of other vascular implants and grafts: Secondary | ICD-10-CM

## 2016-09-15 DIAGNOSIS — K5900 Constipation, unspecified: Secondary | ICD-10-CM

## 2016-09-15 DIAGNOSIS — D500 Iron deficiency anemia secondary to blood loss (chronic): Secondary | ICD-10-CM

## 2016-09-15 DIAGNOSIS — Z5112 Encounter for antineoplastic immunotherapy: Secondary | ICD-10-CM | POA: Diagnosis not present

## 2016-09-15 LAB — COMPREHENSIVE METABOLIC PANEL
ALT: 10 U/L (ref 0–55)
AST: 13 U/L (ref 5–34)
Albumin: 3.3 g/dL — ABNORMAL LOW (ref 3.5–5.0)
Alkaline Phosphatase: 126 U/L (ref 40–150)
Anion Gap: 8 mEq/L (ref 3–11)
BUN: 8.7 mg/dL (ref 7.0–26.0)
CHLORIDE: 102 meq/L (ref 98–109)
CO2: 28 meq/L (ref 22–29)
Calcium: 9.6 mg/dL (ref 8.4–10.4)
Creatinine: 0.6 mg/dL (ref 0.6–1.1)
EGFR: >90 mL/min/{1.73_m2} (ref >90)
GLUCOSE: 88 mg/dL (ref 70–140)
Potassium: 4.5 mEq/L (ref 3.5–5.1)
SODIUM: 138 meq/L (ref 136–145)
TOTAL PROTEIN: 6.8 g/dL (ref 6.4–8.3)

## 2016-09-15 LAB — CBC WITH DIFFERENTIAL/PLATELET
BASO%: 0.9 % (ref 0.0–2.0)
BASOS ABS: 0.1 10*3/uL (ref 0.0–0.1)
EOS ABS: 0.2 10*3/uL (ref 0.0–0.5)
EOS%: 3.6 % (ref 0.0–7.0)
HEMATOCRIT: 31.7 % — AB (ref 34.8–46.6)
HEMOGLOBIN: 10.2 g/dL — AB (ref 11.6–15.9)
LYMPH#: 2 10*3/uL (ref 0.9–3.3)
LYMPH%: 33.8 % (ref 14.0–49.7)
MCH: 26.8 pg (ref 25.1–34.0)
MCHC: 32.2 g/dL (ref 31.5–36.0)
MCV: 83.4 fL (ref 79.5–101.0)
MONO#: 0.6 10*3/uL (ref 0.1–0.9)
MONO%: 11 % (ref 0.0–14.0)
NEUT#: 2.9 10*3/uL (ref 1.5–6.5)
NEUT%: 50.7 % (ref 38.4–76.8)
PLATELETS: 250 10*3/uL (ref 145–400)
RBC: 3.8 10*6/uL (ref 3.70–5.45)
RDW: 19.9 % — ABNORMAL HIGH (ref 11.2–14.5)
WBC: 5.8 10*3/uL (ref 3.9–10.3)
nRBC: 0 % (ref 0–0)

## 2016-09-15 LAB — CEA (IN HOUSE-CHCC): CEA (CHCC-In House): 9.01 ng/mL — ABNORMAL HIGH (ref 0.00–5.00)

## 2016-09-15 MED ORDER — ATROPINE SULFATE 1 MG/ML IJ SOLN
INTRAMUSCULAR | Status: AC
  Filled 2016-09-15: qty 1

## 2016-09-15 MED ORDER — SODIUM CHLORIDE 0.9 % IV SOLN
Freq: Once | INTRAVENOUS | Status: AC
  Administered 2016-09-15: 14:00:00 via INTRAVENOUS

## 2016-09-15 MED ORDER — BEVACIZUMAB CHEMO INJECTION 400 MG/16ML
5.0000 mg/kg | Freq: Once | INTRAVENOUS | Status: AC
  Administered 2016-09-15: 300 mg via INTRAVENOUS
  Filled 2016-09-15: qty 12

## 2016-09-15 MED ORDER — ATROPINE SULFATE 1 MG/ML IJ SOLN
0.5000 mg | Freq: Once | INTRAMUSCULAR | Status: AC | PRN
  Administered 2016-09-15: 0.5 mg via INTRAVENOUS

## 2016-09-15 MED ORDER — SODIUM CHLORIDE 0.9 % IV SOLN
2400.0000 mg/m2 | INTRAVENOUS | Status: DC
  Administered 2016-09-15: 3850 mg via INTRAVENOUS
  Filled 2016-09-15: qty 77

## 2016-09-15 MED ORDER — DEXAMETHASONE SODIUM PHOSPHATE 10 MG/ML IJ SOLN
10.0000 mg | Freq: Once | INTRAMUSCULAR | Status: AC
  Administered 2016-09-15: 10 mg via INTRAVENOUS

## 2016-09-15 MED ORDER — PALONOSETRON HCL INJECTION 0.25 MG/5ML
0.2500 mg | Freq: Once | INTRAVENOUS | Status: AC
  Administered 2016-09-15: 0.25 mg via INTRAVENOUS

## 2016-09-15 MED ORDER — IRINOTECAN HCL CHEMO INJECTION 100 MG/5ML
185.0000 mg/m2 | Freq: Once | INTRAVENOUS | Status: AC
  Administered 2016-09-15: 300 mg via INTRAVENOUS
  Filled 2016-09-15: qty 15

## 2016-09-15 MED ORDER — ALPRAZOLAM 0.25 MG PO TABS: 0.2500 mg | ORAL_TABLET | Freq: Every evening | ORAL | 0 refills | Status: DC | PRN

## 2016-09-15 MED ORDER — DEXTROSE 5 % IV SOLN
400.0000 mg/m2 | Freq: Once | INTRAVENOUS | Status: AC
  Administered 2016-09-15: 640 mg via INTRAVENOUS
  Filled 2016-09-15: qty 32

## 2016-09-15 MED ORDER — DEXAMETHASONE SODIUM PHOSPHATE 10 MG/ML IJ SOLN
INTRAMUSCULAR | Status: AC
  Filled 2016-09-15: qty 1

## 2016-09-15 MED ORDER — SODIUM CHLORIDE 0.9 % IJ SOLN
10.0000 mL | INTRAMUSCULAR | Status: DC | PRN
  Administered 2016-09-15: 10 mL via INTRAVENOUS
  Filled 2016-09-15: qty 10

## 2016-09-15 MED ORDER — PALONOSETRON HCL INJECTION 0.25 MG/5ML
INTRAVENOUS | Status: AC
  Filled 2016-09-15: qty 5

## 2016-09-15 NOTE — Telephone Encounter (Signed)
Message sent to chemo scheduler to be added per 09/15/16 los. Per Audie Clear, ok to add patient on 10/13/16 @ 11:45 for Dr Burr Medico, per patient avavilability. Appointments scheduled per 09/15/16 los. Patient was given a copy of the AVS report and appointment schedule per 09/15/16 los. Patient refused contrast and stated that she will take water based contrast on day of CT.

## 2016-09-15 NOTE — Telephone Encounter (Signed)
Per 2/22 LOS and staff message I have scheduled appts. Notified the scheduler 

## 2016-09-15 NOTE — Patient Instructions (Signed)

## 2016-09-17 ENCOUNTER — Ambulatory Visit: Payer: 59

## 2016-09-17 VITALS — BP 120/88 | HR 91 | Temp 98.6°F | Resp 18

## 2016-09-17 DIAGNOSIS — C182 Malignant neoplasm of ascending colon: Secondary | ICD-10-CM

## 2016-09-17 MED ORDER — HEPARIN SOD (PORK) LOCK FLUSH 100 UNIT/ML IV SOLN
500.0000 [IU] | Freq: Once | INTRAVENOUS | Status: DC | PRN
  Filled 2016-09-17: qty 5

## 2016-09-17 MED ORDER — SODIUM CHLORIDE 0.9% FLUSH
10.0000 mL | INTRAVENOUS | Status: DC | PRN
  Filled 2016-09-17: qty 10

## 2016-09-29 ENCOUNTER — Other Ambulatory Visit (HOSPITAL_BASED_OUTPATIENT_CLINIC_OR_DEPARTMENT_OTHER): Payer: 59

## 2016-09-29 ENCOUNTER — Ambulatory Visit: Payer: 59

## 2016-09-29 ENCOUNTER — Other Ambulatory Visit: Payer: Self-pay | Admitting: *Deleted

## 2016-09-29 ENCOUNTER — Ambulatory Visit (HOSPITAL_BASED_OUTPATIENT_CLINIC_OR_DEPARTMENT_OTHER): Payer: 59

## 2016-09-29 VITALS — BP 101/87 | HR 97 | Temp 98.7°F | Resp 18

## 2016-09-29 DIAGNOSIS — C182 Malignant neoplasm of ascending colon: Secondary | ICD-10-CM | POA: Diagnosis not present

## 2016-09-29 DIAGNOSIS — Z5111 Encounter for antineoplastic chemotherapy: Secondary | ICD-10-CM

## 2016-09-29 DIAGNOSIS — Z5112 Encounter for antineoplastic immunotherapy: Secondary | ICD-10-CM | POA: Diagnosis not present

## 2016-09-29 DIAGNOSIS — Z95828 Presence of other vascular implants and grafts: Secondary | ICD-10-CM

## 2016-09-29 DIAGNOSIS — D500 Iron deficiency anemia secondary to blood loss (chronic): Secondary | ICD-10-CM

## 2016-09-29 LAB — CBC WITH DIFFERENTIAL/PLATELET
BASO%: 0.6 % (ref 0.0–2.0)
BASOS ABS: 0 10*3/uL (ref 0.0–0.1)
EOS ABS: 0.2 10*3/uL (ref 0.0–0.5)
EOS%: 2.9 % (ref 0.0–7.0)
HEMATOCRIT: 31.4 % — AB (ref 34.8–46.6)
HGB: 10.1 g/dL — ABNORMAL LOW (ref 11.6–15.9)
LYMPH#: 1.3 10*3/uL (ref 0.9–3.3)
LYMPH%: 19.6 % (ref 14.0–49.7)
MCH: 26.9 pg (ref 25.1–34.0)
MCHC: 32.2 g/dL (ref 31.5–36.0)
MCV: 83.7 fL (ref 79.5–101.0)
MONO#: 0.6 10*3/uL (ref 0.1–0.9)
MONO%: 9 % (ref 0.0–14.0)
NEUT#: 4.6 10*3/uL (ref 1.5–6.5)
NEUT%: 67.9 % (ref 38.4–76.8)
PLATELETS: 225 10*3/uL (ref 145–400)
RBC: 3.75 10*6/uL (ref 3.70–5.45)
RDW: 17.9 % — ABNORMAL HIGH (ref 11.2–14.5)
WBC: 6.8 10*3/uL (ref 3.9–10.3)

## 2016-09-29 LAB — COMPREHENSIVE METABOLIC PANEL
ALT: 9 U/L (ref 0–55)
AST: 12 U/L (ref 5–34)
Albumin: 3.2 g/dL — ABNORMAL LOW (ref 3.5–5.0)
Alkaline Phosphatase: 113 U/L (ref 40–150)
Anion Gap: 8 mEq/L (ref 3–11)
BUN: 7.1 mg/dL (ref 7.0–26.0)
CHLORIDE: 106 meq/L (ref 98–109)
CO2: 24 meq/L (ref 22–29)
Calcium: 9.5 mg/dL (ref 8.4–10.4)
Creatinine: 0.6 mg/dL (ref 0.6–1.1)
EGFR: >90 mL/min/{1.73_m2} (ref >90)
Glucose: 84 mg/dl (ref 70–140)
POTASSIUM: 4.1 meq/L (ref 3.5–5.1)
SODIUM: 138 meq/L (ref 136–145)
Total Bilirubin: 0.24 mg/dL (ref 0.20–1.20)
Total Protein: 6.6 g/dL (ref 6.4–8.3)

## 2016-09-29 MED ORDER — OXYCODONE HCL 10 MG PO TABS: 10.0000 mg | ORAL_TABLET | ORAL | 0 refills | Status: DC

## 2016-09-29 MED ORDER — IRINOTECAN HCL CHEMO INJECTION 100 MG/5ML
185.0000 mg/m2 | Freq: Once | INTRAVENOUS | Status: AC
  Administered 2016-09-29: 300 mg via INTRAVENOUS
  Filled 2016-09-29: qty 15

## 2016-09-29 MED ORDER — DEXAMETHASONE SODIUM PHOSPHATE 10 MG/ML IJ SOLN
10.0000 mg | Freq: Once | INTRAMUSCULAR | Status: AC
  Administered 2016-09-29: 10 mg via INTRAVENOUS

## 2016-09-29 MED ORDER — HEPARIN SOD (PORK) LOCK FLUSH 100 UNIT/ML IV SOLN
500.0000 [IU] | Freq: Once | INTRAVENOUS | Status: DC | PRN
  Filled 2016-09-29: qty 5

## 2016-09-29 MED ORDER — DEXTROSE 5 % IV SOLN
400.0000 mg/m2 | Freq: Once | INTRAVENOUS | Status: AC
  Administered 2016-09-29: 640 mg via INTRAVENOUS
  Filled 2016-09-29: qty 32

## 2016-09-29 MED ORDER — SODIUM CHLORIDE 0.9 % IV SOLN
5.0000 mg/kg | Freq: Once | INTRAVENOUS | Status: AC
  Administered 2016-09-29: 300 mg via INTRAVENOUS
  Filled 2016-09-29: qty 12

## 2016-09-29 MED ORDER — PALONOSETRON HCL INJECTION 0.25 MG/5ML
0.2500 mg | Freq: Once | INTRAVENOUS | Status: AC
Start: 2016-09-29 — End: 2016-09-29
  Administered 2016-09-29: 0.25 mg via INTRAVENOUS

## 2016-09-29 MED ORDER — SODIUM CHLORIDE 0.9 % IV SOLN
Freq: Once | INTRAVENOUS | Status: AC
  Administered 2016-09-29: 13:00:00 via INTRAVENOUS

## 2016-09-29 MED ORDER — SODIUM CHLORIDE 0.9% FLUSH
10.0000 mL | INTRAVENOUS | Status: DC | PRN
  Filled 2016-09-29: qty 10

## 2016-09-29 MED ORDER — SODIUM CHLORIDE 0.9 % IV SOLN
2400.0000 mg/m2 | INTRAVENOUS | Status: DC
  Administered 2016-09-29: 3850 mg via INTRAVENOUS
  Filled 2016-09-29: qty 77

## 2016-09-29 MED ORDER — PALONOSETRON HCL INJECTION 0.25 MG/5ML
INTRAVENOUS | Status: AC
  Filled 2016-09-29: qty 5

## 2016-09-29 MED ORDER — SODIUM CHLORIDE 0.9 % IJ SOLN
10.0000 mL | INTRAMUSCULAR | Status: DC | PRN
  Administered 2016-09-29: 10 mL via INTRAVENOUS
  Filled 2016-09-29: qty 10

## 2016-09-29 MED ORDER — DEXAMETHASONE SODIUM PHOSPHATE 10 MG/ML IJ SOLN
INTRAMUSCULAR | Status: AC
  Filled 2016-09-29: qty 1

## 2016-09-29 NOTE — Patient Instructions (Signed)
[facility] Discharge Instructions for Patients Receiving Chemotherapy  Today you received the following chemotherapy agents: Avastin, Irinotecan, Leucovorin and Adrucil   To help prevent nausea and vomiting after your treatment, we encourage you to take your nausea medication as directed.    If you develop nausea and vomiting that is not controlled by your nausea medication, call the clinic.   BELOW ARE SYMPTOMS THAT SHOULD BE REPORTED IMMEDIATELY:  *FEVER GREATER THAN 100.5 F  *CHILLS WITH OR WITHOUT FEVER  NAUSEA AND VOMITING THAT IS NOT CONTROLLED WITH YOUR NAUSEA MEDICATION  *UNUSUAL SHORTNESS OF BREATH  *UNUSUAL BRUISING OR BLEEDING  TENDERNESS IN MOUTH AND THROAT WITH OR WITHOUT PRESENCE OF ULCERS  *URINARY PROBLEMS  *BOWEL PROBLEMS  UNUSUAL RASH Items with * indicate a potential emergency and should be followed up as soon as possible.  Feel free to call the clinic you have any questions or concerns. The clinic phone number is [phone].  Please show the CHEMO ALERT CARD at check-in to the Emergency Department and triage nurse.   

## 2016-10-01 ENCOUNTER — Ambulatory Visit (HOSPITAL_BASED_OUTPATIENT_CLINIC_OR_DEPARTMENT_OTHER): Payer: Self-pay

## 2016-10-01 VITALS — BP 134/93 | HR 91 | Temp 98.2°F | Resp 18

## 2016-10-01 DIAGNOSIS — C182 Malignant neoplasm of ascending colon: Secondary | ICD-10-CM

## 2016-10-01 MED ORDER — SODIUM CHLORIDE 0.9% FLUSH
10.0000 mL | INTRAVENOUS | Status: DC | PRN
  Administered 2016-10-01: 10 mL
  Filled 2016-10-01: qty 10

## 2016-10-01 MED ORDER — HEPARIN SOD (PORK) LOCK FLUSH 100 UNIT/ML IV SOLN
500.0000 [IU] | Freq: Once | INTRAVENOUS | Status: AC | PRN
  Administered 2016-10-01: 500 [IU]
  Filled 2016-10-01: qty 5

## 2016-10-01 NOTE — Patient Instructions (Signed)
[facility] Discharge Instructions for Patients Receiving Chemotherapy  Today you completed your 36fu pump To help prevent nausea and vomiting after your treatment, we encourage you to take your nausea medication as prescribed.  If you develop nausea and vomiting that is not controlled by your nausea medication, call the clinic.   BELOW ARE SYMPTOMS THAT SHOULD BE REPORTED IMMEDIATELY:  *FEVER GREATER THAN 100.5 F  *CHILLS WITH OR WITHOUT FEVER  NAUSEA AND VOMITING THAT IS NOT CONTROLLED WITH YOUR NAUSEA MEDICATION  *UNUSUAL SHORTNESS OF BREATH  *UNUSUAL BRUISING OR BLEEDING  TENDERNESS IN MOUTH AND THROAT WITH OR WITHOUT PRESENCE OF ULCERS  *URINARY PROBLEMS  *BOWEL PROBLEMS  UNUSUAL RASH Items with * indicate a potential emergency and should be followed up as soon as possible.  Feel free to call the clinic you have any questions or concerns. The clinic phone number is [phone].  Please show the [facility] [place] at check-in to the Emergency Department and triage nurse.

## 2016-10-04 ENCOUNTER — Telehealth: Payer: Self-pay | Admitting: *Deleted

## 2016-10-04 ENCOUNTER — Ambulatory Visit (HOSPITAL_COMMUNITY)
Admission: RE | Admit: 2016-10-04 | Discharge: 2016-10-04 | Disposition: A | Discharge status: Home / Self Care | Payer: 59 | Source: Ambulatory Visit | Attending: Nurse Practitioner | Admitting: Nurse Practitioner

## 2016-10-04 ENCOUNTER — Other Ambulatory Visit: Payer: Self-pay | Admitting: Nurse Practitioner

## 2016-10-04 ENCOUNTER — Ambulatory Visit: Payer: 59

## 2016-10-04 ENCOUNTER — Encounter: Payer: Self-pay | Admitting: Nurse Practitioner

## 2016-10-04 ENCOUNTER — Ambulatory Visit (HOSPITAL_BASED_OUTPATIENT_CLINIC_OR_DEPARTMENT_OTHER): Payer: 59 | Admitting: Nurse Practitioner

## 2016-10-04 ENCOUNTER — Ambulatory Visit (HOSPITAL_BASED_OUTPATIENT_CLINIC_OR_DEPARTMENT_OTHER): Payer: 59

## 2016-10-04 VITALS — BP 125/78 | HR 121 | Temp 100.0°F | Resp 17 | Wt 117.8 lb

## 2016-10-04 DIAGNOSIS — C182 Malignant neoplasm of ascending colon: Secondary | ICD-10-CM | POA: Diagnosis present

## 2016-10-04 DIAGNOSIS — C786 Secondary malignant neoplasm of retroperitoneum and peritoneum: Secondary | ICD-10-CM | POA: Diagnosis not present

## 2016-10-04 DIAGNOSIS — R300 Dysuria: Secondary | ICD-10-CM

## 2016-10-04 DIAGNOSIS — C787 Secondary malignant neoplasm of liver and intrahepatic bile duct: Secondary | ICD-10-CM | POA: Diagnosis not present

## 2016-10-04 DIAGNOSIS — R102 Pelvic and perineal pain: Secondary | ICD-10-CM | POA: Diagnosis not present

## 2016-10-04 DIAGNOSIS — N23 Unspecified renal colic: Secondary | ICD-10-CM

## 2016-10-04 DIAGNOSIS — C7800 Secondary malignant neoplasm of unspecified lung: Secondary | ICD-10-CM

## 2016-10-04 DIAGNOSIS — M545 Low back pain: Secondary | ICD-10-CM | POA: Diagnosis not present

## 2016-10-04 DIAGNOSIS — D500 Iron deficiency anemia secondary to blood loss (chronic): Secondary | ICD-10-CM

## 2016-10-04 DIAGNOSIS — E860 Dehydration: Secondary | ICD-10-CM

## 2016-10-04 DIAGNOSIS — Z95828 Presence of other vascular implants and grafts: Secondary | ICD-10-CM

## 2016-10-04 LAB — COMPREHENSIVE METABOLIC PANEL
ALBUMIN: 3.2 g/dL — AB (ref 3.5–5.0)
ALK PHOS: 128 U/L (ref 40–150)
ALT: 9 U/L (ref 0–55)
AST: 11 U/L (ref 5–34)
Anion Gap: 10 mEq/L (ref 3–11)
BUN: 8.1 mg/dL (ref 7.0–26.0)
CALCIUM: 10.3 mg/dL (ref 8.4–10.4)
CO2: 25 mEq/L (ref 22–29)
Chloride: 97 mEq/L — ABNORMAL LOW (ref 98–109)
Creatinine: 0.7 mg/dL (ref 0.6–1.1)
GLUCOSE: 103 mg/dL (ref 70–140)
Potassium: 4.5 mEq/L (ref 3.5–5.1)
SODIUM: 132 meq/L — AB (ref 136–145)
Total Bilirubin: 0.42 mg/dL (ref 0.20–1.20)
Total Protein: 7.5 g/dL (ref 6.4–8.3)

## 2016-10-04 LAB — URINALYSIS, MICROSCOPIC - CHCC
BLOOD: NEGATIVE
Bilirubin (Urine): NEGATIVE
Glucose: NEGATIVE mg/dL
Ketones: NEGATIVE mg/dL
LEUKOCYTE ESTERASE: NEGATIVE
Nitrite: NEGATIVE
SPECIFIC GRAVITY, URINE: 1.01 (ref 1.003–1.035)
UROBILINOGEN UR: 0.2 mg/dL (ref 0.2–1)
pH: 7 (ref 4.6–8.0)

## 2016-10-04 LAB — CBC WITH DIFFERENTIAL/PLATELET
BASO%: 0.4 % (ref 0.0–2.0)
Basophils Absolute: 0 10*3/uL (ref 0.0–0.1)
EOS%: 2.1 % (ref 0.0–7.0)
Eosinophils Absolute: 0.1 10*3/uL (ref 0.0–0.5)
HCT: 33.8 % — ABNORMAL LOW (ref 34.8–46.6)
HGB: 11.1 g/dL — ABNORMAL LOW (ref 11.6–15.9)
LYMPH%: 22.5 % (ref 14.0–49.7)
MCH: 27.5 pg (ref 25.1–34.0)
MCHC: 32.8 g/dL (ref 31.5–36.0)
MCV: 83.7 fL (ref 79.5–101.0)
MONO#: 0.3 10*3/uL (ref 0.1–0.9)
MONO%: 6.5 % (ref 0.0–14.0)
NEUT#: 3.6 10*3/uL (ref 1.5–6.5)
NEUT%: 68.5 % (ref 38.4–76.8)
Platelets: 221 10*3/uL (ref 145–400)
RBC: 4.04 10*6/uL (ref 3.70–5.45)
RDW: 16.4 % — ABNORMAL HIGH (ref 11.2–14.5)
WBC: 5.3 10*3/uL (ref 3.9–10.3)
lymph#: 1.2 10*3/uL (ref 0.9–3.3)

## 2016-10-04 MED ORDER — SODIUM CHLORIDE 0.9 % IJ SOLN
10.0000 mL | INTRAMUSCULAR | Status: DC | PRN
  Administered 2016-10-04: 10 mL via INTRAVENOUS
  Filled 2016-10-04: qty 10

## 2016-10-04 MED ORDER — HEPARIN SOD (PORK) LOCK FLUSH 100 UNIT/ML IV SOLN
500.0000 [IU] | INTRAVENOUS | Status: DC | PRN
  Administered 2016-10-04: 500 [IU] via INTRAVENOUS
  Filled 2016-10-04: qty 5

## 2016-10-04 MED ORDER — SODIUM CHLORIDE 0.9 % IV SOLN
Freq: Once | INTRAVENOUS | Status: AC
  Administered 2016-10-04: 15:00:00 via INTRAVENOUS

## 2016-10-04 MED ORDER — MORPHINE SULFATE 4 MG/ML IJ SOLN
2.0000 mg | Freq: Once | INTRAMUSCULAR | Status: AC
  Administered 2016-10-04: 2 mg via INTRAVENOUS
  Filled 2016-10-04: qty 1

## 2016-10-04 MED ORDER — ALTEPLASE 2 MG IJ SOLR
2.0000 mg | Freq: Once | INTRAMUSCULAR | Status: DC | PRN
  Filled 2016-10-04: qty 2

## 2016-10-04 MED ORDER — MORPHINE SULFATE (PF) 4 MG/ML IV SOLN
INTRAVENOUS | Status: AC
  Filled 2016-10-04: qty 1

## 2016-10-04 NOTE — Assessment & Plan Note (Signed)
Patient received her last Folfiri/Avastin chemotherapy on 09/29/2016.  She presented to the [facility] today with complaint of lower pelvic region discomfort.  She also reports some low back discomfort as well.  She feels increased discomfort when she attempts to urinate.  She states that she has not had a bowel movement since last Thursday, 09/29/2016.  She has been taking stool softeners twice a day and occasional mag citrate.  She also states that she's had decreased appetite.  She denies any vomiting; but does admit to nausea on occasion.  Patient continues to take oxycodone 10 mg tablets every 6-8 hours.  Exam today reveals bowel sounds positive in all 4 quads.  Mild tenderness with palpation to lower pelvic region.  No flank pain noted on exam.  Patient appears tired and mildly dehydrated today.  Labs obtained today revealed sodium down to 132.  Patient will receive IV fluid rehydration today.  Also, urinalysis obtained today revealed no UTI.  Urine culture results are pending.  Reviewed all findings with Dr.Feng and she has advised that patient obtain a restaging CT with contrast of the chest/abdomen/pelvis as soon as possible to evaluate for the worsening abdominal/pelvic region discomfort.  Component was made for patient to undergo for restaging CT tomorrow morning at 10:30 AM.  In the meantime-patient was advised that she may increase the OxyContin codon 10 mg tablet to 1 tablet every 4 hours if needed for pain.  Also, patient was advised to go directly to the emergency department for any worsening symptoms whatsoever.

## 2016-10-04 NOTE — Telephone Encounter (Signed)
Patient called complaining of abdomen pain since chemotherapy pump (5-FU) was disconnected. Patient states that this is not a gas pain, but more so a swelling pain (has noticed a difference in abdomen size). Patient LBM was 09/29/16 and she currently take stool softeners, but denies taking anything else other than pain medication. Patient does not think the swelling and/or pain is due to constipation and would like for it to be evaluated.

## 2016-10-04 NOTE — Assessment & Plan Note (Signed)
Patient states she's had decreased appetite and poor oral intake recently.  She's lost proximal and 6 pounds since her last weight check.  She feels dehydrated today.  Patient will receive IV fluid rehydration while at the cancer Center today.  She was also encouraged to push fluids is much as possible.

## 2016-10-04 NOTE — Addendum Note (Signed)
Addended by: Aura Fey A on: 10/04/2016 05:03 PM   Modules accepted: Orders

## 2016-10-04 NOTE — Progress Notes (Signed)
SYMPTOM MANAGEMENT CLINIC    Chief Complaint: Abdominal pain  HPI:  [name] 51 y.o. 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  Currently undergoing Folfiri/Avastin.  Chemotherapy therapy regimen.   Oncology History   Cancer of right colon [facility])   Staging form: Colon and Rectum, AJCC 7th Edition     Pathologic stage from 08/03/2015: Stage IIIB (T4a, N1a, cM0) - Signed by Truitt Merle, MD on 09/04/2015       Cancer of right colon ([place])   03/31/2015 Imaging    CT abdomen showed appendicitis, perforated, with periappendiceal abscess. And small pulmonary nodules.      04/03/2015 Procedure    CT guided placement of going Through into the right lower abdominal quadrant with aspiration of a total of 30 mL prudent fluid.       08/03/2015 Initial Diagnosis    Cancer of right colon ([place])      08/03/2015 Surgery    Right hemicolectomy      08/03/2015 Pathology Results    Right hemicolectomy showed invasive adenocarcinoma, moderately differentiated, likely arising from a cecal valve. One out of 40 lymph nodes were positive, (+) LVI, margins negative       08/03/2015 Miscellaneous    colon cance MSI-stable       09/18/2015 - 03/31/2016 Chemotherapy    Adjuvant CAPOX, she tolerated oxaliplatin poorly, which was held from cycle 2 to 4 due to poor tolerance, restarted from cycle 5 with lower dose, she also had significant hand-foot syndrome from Xeloda. She completed a total of 8 cycles.      05/20/2016 Imaging    PET scan revealed hypermetabolic peritoneal carcinomatosis, five scatted pulmonary nodules in both lungs, largest 4 mm, metastasis not excluded. Hypermetabolic mass in the deep subcutaneous ventral right lateral pelvic wall, probable metastasis.      06/02/2016 -  Chemotherapy    FOLFIRI every 2 weeks, started on 06/02/2016, Avastin added from cycle 3       06/26/2016 Miscellaneous    Foundation One revealed KRAS  mutation (+), MSI-stable       08/17/2016 Imaging    CT CAP IMPRESSION: 1. Interval progression of peritoneal carcinomatosis with progressive scalloping of the liver and increase in size of the peritoneal masses. 2. Multiple pulmonary nodules are again noted. All these remain stable with the exception of a left upper lobe nodule which has increased to 5 mm from 3 mm previously.       Review of Systems  Constitutional: Positive for malaise/fatigue and weight loss.  Gastrointestinal: Positive for abdominal pain.  Genitourinary: Positive for dysuria.  Neurological: Positive for weakness.  All other systems reviewed and are negative.   Past Medical History:  Diagnosis Date  . Anemia   . Anxiety   . Depression   . Fibromyalgia   . GERD (gastroesophageal reflux disease)   . JP drain bleeding     Past Surgical History:  Procedure Laterality Date  . ABDOMINAL HYSTERECTOMY    . COLON SURGERY  08/03/2015  . COLOSTOMY REVISION N/A 08/03/2015   Procedure: COLON RESECTION RIGHT;  Surgeon: Rolm Bookbinder, MD;  Location: [place];  Service: General;  Laterality: N/A;  . GASTRIC BYPASS    . IR GENERIC HISTORICAL  05/30/2016   IR FLUORO GUIDE PORT INSERTION RIGHT 05/30/2016 Aletta Edouard, MD [facility]  . IR GENERIC HISTORICAL  05/30/2016   IR US GUIDE VASC ACCESS RIGHT 05/30/2016 Aletta Edouard, MD [facility]  RAD  . LAPAROSCOPIC ILEOCECECTOMY Right 08/03/2015   Procedure: LAPAROSCOPIC DIAGNOSTIC RIGHT COLECTOMY;  Surgeon: Rolm Bookbinder, MD;  Location: [place];  Service: General;  Laterality: Right;    has Perforated appendicitis; Appendiceal abscess; Appendicitis, acute; S/P colectomy; Cancer of right colon ([place]); Anemia, iron deficiency; Dehydration; Hyponatremia; Hypotension; Transaminitis; Hyperphosphatemia; Hypoalbuminemia due to protein-calorie malnutrition ([place]); Insomnia; Open abdominal wall wound; Hand foot syndrome; and Port catheter in place on her problem list.    is  allergic to latex; cymbalta [duloxetine hcl]; and penicillins.  Allergies as of 10/04/2016      Reactions   Latex Hives   Cymbalta [duloxetine Hcl] Rash   Penicillins Rash   Has patient had a PCN reaction causing immediate rash, facial/tongue/throat swelling, SOB or lightheadedness with hypotension: No Has patient had a PCN reaction causing severe rash involving mucus membranes or skin necrosis: No Has patient had a PCN reaction that required hospitalization No Has patient had a PCN reaction occurring within the last 10 years: No If all of the above answers are "NO", then may proceed with Cephalosporin use.      Medication List       Accurate as of 10/04/16  4:34 PM. Always use your most recent med list.          acetaminophen 500 MG tablet Commonly known as:  TYLENOL Take 1,000 mg by mouth every 6 (six) hours as needed for fever. Reported on 09/24/2015   ALPRAZolam 0.25 MG tablet Commonly known as:  XANAX Take 1-2 tablets (0.25-0.5 mg total) by mouth at bedtime as needed for anxiety or sleep.   bisacodyl 5 MG EC tablet Commonly known as:  DULCOLAX Take 10 mg by mouth daily as needed for moderate constipation.   cyclobenzaprine 10 MG tablet Commonly known as:  FLEXERIL Take 1 tablet (10 mg total) by mouth 3 (three) times daily as needed for muscle spasms.   dexamethasone 4 MG tablet Commonly known as:  DECADRON Take 2 tablets (8 mg total) by mouth daily. Start day after chemo and take for 3 days after each IV chemo   diphenhydrAMINE 25 mg capsule Commonly known as:  BENADRYL Take 25 mg by mouth every 6 (six) hours as needed for sleep.   docusate sodium 100 MG capsule Commonly known as:  COLACE Take 300 mg by mouth daily.   escitalopram 10 MG tablet Commonly known as:  LEXAPRO Take 10 mg by mouth daily.   ferrous sulfate 325 (65 FE) MG tablet Take 325 mg by mouth 2 (two) times daily with a meal. Reported on 10/12/2015   lidocaine-prilocaine cream Commonly known as:   EMLA Apply 1 application topically as needed.   ondansetron 8 MG tablet Commonly known as:  ZOFRAN Take 1 tablet (8 mg total) by mouth 2 (two) times daily as needed for refractory nausea / vomiting. Start on day 3 after chemotherapy.   Oxycodone HCl 10 MG Tabs Take 1 tablet (10 mg total) by mouth every 4 (four) hours.   prochlorperazine 10 MG tablet Commonly known as:  COMPAZINE Take 1 tablet (10 mg total) by mouth every 6 (six) hours as needed (NAUSEA).   traMADol 50 MG tablet Commonly known as:  ULTRAM Take 1-2 tablets (50-100 mg total) by mouth every 6 (six) hours as needed.   urea 10 % cream Commonly known as:  CARMOL Apply topically as needed.   vitamin B-12 1000 MCG tablet Commonly known as:  CYANOCOBALAMIN Take 500 mcg by mouth daily. Reported on 09/24/2015  vitamin C 500 MG tablet Commonly known as:  ASCORBIC ACID Take 500 mg by mouth daily.        PHYSICAL EXAMINATION  Oncology Vitals 10/04/2016 10/01/2016  Height - -  Weight 53.434 kg -  Weight (lbs) 117 lbs 13 oz -  BMI (kg/m2) 22.26 kg/m2 -  Temp 98.9 98.2  Pulse 133 91  Resp 16 18  SpO2 97 100  BSA (m2) 1.52 m2 -   BP Readings from Last 2 Encounters:  10/04/16 112/86  10/01/16 (!) 134/93    Physical Exam  Constitutional: She is oriented to person, place, and time. She appears malnourished and dehydrated. She appears unhealthy. She appears cachectic.  HENT:  Head: Normocephalic and atraumatic.  Mouth/Throat: Oropharynx is clear and moist.  Eyes: Conjunctivae and EOM are normal. Pupils are equal, round, and reactive to light. Right eye exhibits no discharge. Left eye exhibits no discharge. No scleral icterus.  Neck: Normal range of motion. Neck supple. No JVD present. No tracheal deviation present. No thyromegaly present.  Cardiovascular: Normal rate, regular rhythm, normal heart sounds and intact distal pulses.   Pulmonary/Chest: Effort normal and breath sounds normal. No respiratory distress. She  has no wheezes. She has no rales. She exhibits no tenderness.  Abdominal: Soft. Bowel sounds are normal. She exhibits no distension and no mass. There is tenderness. There is no rebound and no guarding.  Musculoskeletal: Normal range of motion. She exhibits no edema or tenderness.  Lymphadenopathy:    She has no cervical adenopathy.  Neurological: She is alert and oriented to person, place, and time. Gait normal.  Skin: Skin is warm and dry. No rash noted. No erythema. There is pallor.  Psychiatric: Affect normal.  Nursing note and vitals reviewed.   LABORATORY DATA:. Appointment on 10/04/2016  Component Date Value Ref Range Status  . Sodium 10/04/2016 132* 136 - 145 mEq/L Final  . Potassium 10/04/2016 4.5  3.5 - 5.1 mEq/L Final  . Chloride 10/04/2016 97* 98 - 109 mEq/L Final  . CO2 10/04/2016 25  22 - 29 mEq/L Final  . Glucose 10/04/2016 103  70 - 140 mg/dl Final  . BUN 10/04/2016 8.1  7.0 - 26.0 mg/dL Final  . Creatinine 10/04/2016 0.7  0.6 - 1.1 mg/dL Final  . Total Bilirubin 10/04/2016 0.42  0.20 - 1.20 mg/dL Final  . Alkaline Phosphatase 10/04/2016 128  40 - 150 U/L Final  . AST 10/04/2016 11  5 - 34 U/L Final  . ALT 10/04/2016 9  0 - 55 U/L Final  . Total Protein 10/04/2016 7.5  6.4 - 8.3 g/dL Final  . Albumin 10/04/2016 3.2* 3.5 - 5.0 g/dL Final  . Calcium 10/04/2016 10.3  8.4 - 10.4 mg/dL Final  . Anion Gap 10/04/2016 10  3 - 11 mEq/L Final  . EGFR 10/04/2016 >90  >90 ml/min/1.73 m2 Final  . WBC 10/04/2016 5.3  3.9 - 10.3 10e3/uL Final  . NEUT# 10/04/2016 3.6  1.5 - 6.5 10e3/uL Final  . HGB 10/04/2016 11.1* 11.6 - 15.9 g/dL Final  . HCT 10/04/2016 33.8* 34.8 - 46.6 % Final  . Platelets 10/04/2016 221  145 - 400 10e3/uL Final  . MCV 10/04/2016 83.7  79.5 - 101.0 fL Final  . MCH 10/04/2016 27.5  25.1 - 34.0 pg Final  . MCHC 10/04/2016 32.8  31.5 - 36.0 g/dL Final  . RBC 10/04/2016 4.04  3.70 - 5.45 10e6/uL Final  . RDW 10/04/2016 16.4* 11.2 - 14.5 % Final  . lymph#  10/04/2016 1.2  0.9 - 3.3 10e3/uL Final  . MONO# 10/04/2016 0.3  0.1 - 0.9 10e3/uL Final  . Eosinophils Absolute 10/04/2016 0.1  0.0 - 0.5 10e3/uL Final  . Basophils Absolute 10/04/2016 0.0  0.0 - 0.1 10e3/uL Final  . NEUT% 10/04/2016 68.5  38.4 - 76.8 % Final  . LYMPH% 10/04/2016 22.5  14.0 - 49.7 % Final  . MONO% 10/04/2016 6.5  0.0 - 14.0 % Final  . EOS% 10/04/2016 2.1  0.0 - 7.0 % Final  . BASO% 10/04/2016 0.4  0.0 - 2.0 % Final  . Glucose 10/04/2016 Negative  Negative mg/dL Final  . Bilirubin (Urine) 10/04/2016 Negative  Negative Final  . Ketones 10/04/2016 Negative  Negative mg/dL Final  . Specific Gravity, Urine 10/04/2016 1.010  1.003 - 1.035 Final  . Blood 10/04/2016 Negative  Negative Final  . pH 10/04/2016 7.0  4.6 - 8.0 Final  . Protein 10/04/2016 < 30  Negative- <30 mg/dL Final  . Urobilinogen, UR 10/04/2016 0.2  0.2 - 1 mg/dL Final  . Nitrite 10/04/2016 Negative  Negative Final  . Leukocyte Esterase 10/04/2016 Negative  Negative Final  . RBC / HPF 10/04/2016 0-2  0 - 2 Final  . WBC, UA 10/04/2016 0-2  0-2;Negative Final  . Bacteria, UA 10/04/2016 Trace  Negative- Trace Final  . Epithelial Cells 10/04/2016 Few  Negative- Few Final    RADIOGRAPHIC STUDIES: Dg Abd 1 View  Result Date: 10/04/2016 CLINICAL DATA:  Abdominal pain x3 days, history of colon cancer EXAM: ABDOMEN - 1 VIEW COMPARISON:  CT abdomen/pelvis dated 08/17/2016 FINDINGS: Nonspecific but nonobstructive bowel gas pattern. Suture line in the left upper abdomen with surgical clip, likely related to gastric bypass. Suture line in the right lower abdomen. Surgical clip in the left lower abdomen. Visualized osseous structures are within normal limits. IMPRESSION: Unremarkable abdominal radiograph. Electronically Signed   By: Julian Hy M.D.   On: 10/04/2016 13:16    ASSESSMENT/PLAN:    Dehydration Patient states she's had decreased appetite and poor oral intake recently.  She's lost proximal and 6 pounds  since her last weight check.  She feels dehydrated today.  Patient will receive IV fluid rehydration while at the cancer Center today.  She was also encouraged to push fluids is much as possible.  Cancer of right colon [facility]) Patient received her last Folfiri/Avastin chemotherapy on 09/29/2016.  She presented to the [facility] today with complaint of lower pelvic region discomfort.  She also reports some low back discomfort as well.  She feels increased discomfort when she attempts to urinate.  She states that she has not had a bowel movement since last Thursday, 09/29/2016.  She has been taking stool softeners twice a day and occasional mag citrate.  She also states that she's had decreased appetite.  She denies any vomiting; but does admit to nausea on occasion.  Patient continues to take oxycodone 10 mg tablets every 6-8 hours.  Exam today reveals bowel sounds positive in all 4 quads.  Mild tenderness with palpation to lower pelvic region.  No flank pain noted on exam.  Patient appears tired and mildly dehydrated today.  Labs obtained today revealed sodium down to 132.  Patient will receive IV fluid rehydration today.  Also, urinalysis obtained today revealed no UTI.  Urine culture results are pending.  Reviewed all findings with Dr.Feng and she has advised that patient obtain a restaging CT with contrast of the chest/abdomen/pelvis as soon as possible to evaluate for the  worsening abdominal/pelvic region discomfort.  Component was made for patient to undergo for restaging CT tomorrow morning at 10:30 AM.  In the meantime-patient was advised that she may increase the OxyContin codon 10 mg tablet to 1 tablet every 4 hours if needed for pain.  Also, patient was advised to go directly to the emergency department for any worsening symptoms whatsoever.   Patient stated understanding of all instructions; and was in agreement with this plan of care. The patient knows to call the clinic with any  problems, questions or concerns.   Total time spent with patient was 25 minutes;  with greater than 75 percent of that time spent in face to face counseling regarding patient's symptoms,  and coordination of care and follow up.  Disclaimer:This dictation was prepared with Dragon/digital dictation along with [employer]. Any transcriptional errors that result from this process are unintentional.  Drue Second, NP 10/04/2016

## 2016-10-04 NOTE — Patient Instructions (Signed)
Dehydration, Adult Dehydration is a condition in which there is not enough fluid or water in the body. This happens when you lose more fluids than you take in. Important organs, such as the kidneys, brain, and heart, cannot function without a proper amount of fluids. Any loss of fluids from the body can lead to dehydration. Dehydration can range from mild to severe. This condition should be treated right away to prevent it from becoming severe. What are the causes? This condition may be caused by:  Vomiting.  Diarrhea.  Excessive sweating, such as from heat exposure or exercise.  Not drinking enough fluid, especially:  When ill.  While doing activity that requires a lot of energy.  Excessive urination.  Fever.  Infection.  Certain medicines, such as medicines that cause the body to lose excess fluid (diuretics).  Inability to access safe drinking water.  Reduced physical ability to get adequate water and food. What increases the risk? This condition is more likely to develop in people:  Who have a poorly controlled long-term (chronic) illness, such as diabetes, heart disease, or kidney disease.  Who are age 65 or older.  Who are disabled.  Who live in a place with high altitude.  Who play endurance sports. What are the signs or symptoms? Symptoms of mild dehydration may include:   Thirst.  Dry lips.  Slightly dry mouth.  Dry, warm skin.  Dizziness. Symptoms of moderate dehydration may include:   Very dry mouth.  Muscle cramps.  Dark urine. Urine may be the color of tea.  Decreased urine production.  Decreased tear production.  Heartbeat that is irregular or faster than normal (palpitations).  Headache.  Light-headedness, especially when you stand up from a sitting position.  Fainting (syncope). Symptoms of severe dehydration may include:   Changes in skin, such as:  Cold and clammy skin.  Blotchy (mottled) or pale skin.  Skin that does  not quickly return to normal after being lightly pinched and released (poor skin turgor).  Changes in body fluids, such as:  Extreme thirst.  No tear production.  Inability to sweat when body temperature is high, such as in hot weather.  Very little urine production.  Changes in vital signs, such as:  Weak pulse.  Pulse that is more than 100 beats a minute when sitting still.  Rapid breathing.  Low blood pressure.  Other changes, such as:  Sunken eyes.  Cold hands and feet.  Confusion.  Lack of energy (lethargy).  Difficulty waking up from sleep.  Short-term weight loss.  Unconsciousness. How is this diagnosed? This condition is diagnosed based on your symptoms and a physical exam. Blood and urine tests may be done to help confirm the diagnosis. How is this treated? Treatment for this condition depends on the severity. Mild or moderate dehydration can often be treated at home. Treatment should be started right away. Do not wait until dehydration becomes severe. Severe dehydration is an emergency and it needs to be treated in a hospital. Treatment for mild dehydration may include:   Drinking more fluids.  Replacing salts and minerals in your blood (electrolytes) that you may have lost. Treatment for moderate dehydration may include:   Drinking an oral rehydration solution (ORS). This is a drink that helps you replace fluids and electrolytes (rehydrate). It can be found at pharmacies and retail stores. Treatment for severe dehydration may include:   Receiving fluids through an IV tube.  Receiving an electrolyte solution through a feeding tube that is   passed through your nose and into your stomach (nasogastric tube, or NG tube).  Correcting any abnormalities in electrolytes.  Treating the underlying cause of dehydration. Follow these instructions at home:  If directed by your health care provider, drink an ORS:  Make an ORS by following instructions on the  package.  Start by drinking small amounts, about  cup (120 mL) every 5-10 minutes.  Slowly increase how much you drink until you have taken the amount recommended by your health care provider.  Drink enough clear fluid to keep your urine clear or pale yellow. If you were told to drink an ORS, finish the ORS first, then start slowly drinking other clear fluids. Drink fluids such as:  Water. Do not drink only water. Doing that can lead to having too little salt (sodium) in the body (hyponatremia).  Ice chips.  Fruit juice that you have added water to (diluted fruit juice).  Low-calorie sports drinks.  Avoid:  Alcohol.  Drinks that contain a lot of sugar. These include high-calorie sports drinks, fruit juice that is not diluted, and soda.  Caffeine.  Foods that are greasy or contain a lot of fat or sugar.  Take over-the-counter and prescription medicines only as told by your health care provider.  Do not take sodium tablets. This can lead to having too much sodium in the body (hypernatremia).  Eat foods that contain a healthy balance of electrolytes, such as bananas, oranges, potatoes, tomatoes, and spinach.  Keep all follow-up visits as told by your health care provider. This is important. Contact a health care provider if:  You have abdominal pain that:  Gets worse.  Stays in one area (localizes).  You have a rash.  You have a stiff neck.  You are more irritable than usual.  You are sleepier or more difficult to wake up than usual.  You feel weak or dizzy.  You feel very thirsty.  You have urinated only a small amount of very dark urine over 6-8 hours. Get help right away if:  You have symptoms of severe dehydration.  You cannot drink fluids without vomiting.  Your symptoms get worse with treatment.  You have a fever.  You have a severe headache.  You have vomiting or diarrhea that:  Gets worse.  Does not go away.  You have blood or green matter  (bile) in your vomit.  You have blood in your stool. This may cause stool to look black and tarry.  You have not urinated in 6-8 hours.  You faint.  Your heart rate while sitting still is over 100 beats a minute.  You have trouble breathing. This information is not intended to replace advice given to you by your health care provider. Make sure you discuss any questions you have with your health care provider. Document Released: 07/11/2005 Document Revised: 02/05/2016 Document Reviewed: 09/04/2015 Elsevier Interactive Patient Education  2017 [employer]vier Inc.  

## 2016-10-04 NOTE — Telephone Encounter (Signed)
[facility] provider Jenny Reichmann, NP notified of pt's call.  Spoke with pt and instructed pt to come in now for : 1.   Go to [facility] radiology now for KUB film. 2.   Then go to the clinic for labs and to see [facility] provider for further evaluation of abdominal pain. Pt voiced understanding.

## 2016-10-04 NOTE — Progress Notes (Signed)
VS re-checked after fluids. Selena Lesser, NP made aware .

## 2016-10-05 ENCOUNTER — Ambulatory Visit (HOSPITAL_COMMUNITY)
Admission: RE | Admit: 2016-10-05 | Discharge: 2016-10-05 | Disposition: A | Discharge status: Home / Self Care | Payer: 59 | Source: Ambulatory Visit | Attending: Nurse Practitioner | Admitting: Nurse Practitioner

## 2016-10-05 ENCOUNTER — Telehealth: Payer: Self-pay | Admitting: Nurse Practitioner

## 2016-10-05 DIAGNOSIS — C182 Malignant neoplasm of ascending colon: Secondary | ICD-10-CM | POA: Insufficient documentation

## 2016-10-05 DIAGNOSIS — R300 Dysuria: Secondary | ICD-10-CM | POA: Diagnosis present

## 2016-10-05 MED ORDER — HEPARIN SOD (PORK) LOCK FLUSH 100 UNIT/ML IV SOLN
500.0000 [IU] | Freq: Once | INTRAVENOUS | Status: AC
Start: 2016-10-05 — End: 2016-10-05
  Administered 2016-10-05: 500 [IU] via INTRAVENOUS

## 2016-10-05 MED ORDER — IOPAMIDOL (ISOVUE-300) INJECTION 61%
INTRAVENOUS | Status: AC
  Administered 2016-10-05: 30 mL via ORAL
  Filled 2016-10-05: qty 30

## 2016-10-05 MED ORDER — HEPARIN SOD (PORK) LOCK FLUSH 100 UNIT/ML IV SOLN
INTRAVENOUS | Status: AC
  Filled 2016-10-05: qty 5

## 2016-10-05 MED ORDER — IOPAMIDOL (ISOVUE-300) INJECTION 61%
INTRAVENOUS | Status: AC
  Administered 2016-10-05: 100 mL
  Filled 2016-10-05: qty 100

## 2016-10-05 NOTE — Telephone Encounter (Signed)
Patient underwent a restaging CT with contrast of the chest/abdomen/pelvis earlier today.  This provider reviewed the CT results with Dr. Julien Nordmann; and then called to give a brief review of the CT results to the patient herself.  Patient states that she has slightly increased her pain medications; and she is feeling much better today.  Patient was advised to call/return or go directly to the emergency department if she develops any worsening symptoms whatsoever.

## 2016-10-06 ENCOUNTER — Telehealth: Payer: Self-pay | Admitting: *Deleted

## 2016-10-06 ENCOUNTER — Other Ambulatory Visit: Payer: Self-pay | Admitting: *Deleted

## 2016-10-06 DIAGNOSIS — N390 Urinary tract infection, site not specified: Secondary | ICD-10-CM

## 2016-10-06 LAB — URINE CULTURE

## 2016-10-06 MED ORDER — AMOXICILLIN-POT CLAVULANATE 875-125 MG PO TABS: 1.0000 | ORAL_TABLET | Freq: Two times a day (BID) | ORAL | 0 refills | Status: DC

## 2016-10-06 NOTE — Telephone Encounter (Signed)
Dr. Burr Medico reviewed urine culture results.  Spoke with pt and instructed pt to start Augmentin 875 mg  Twice daily for  5 Days -  Starting today.   Encouraged pt to push po fluids as tolerated.  Pt voiced understanding. Script escribed to [facility] on [address] as per pt's request.

## 2016-10-07 ENCOUNTER — Telehealth: Payer: Self-pay | Admitting: Hematology

## 2016-10-07 ENCOUNTER — Other Ambulatory Visit: Payer: Self-pay | Admitting: Hematology

## 2016-10-07 ENCOUNTER — Telehealth: Payer: Self-pay | Admitting: *Deleted

## 2016-10-07 MED ORDER — CIPROFLOXACIN HCL 500 MG PO TABS: 500.0000 mg | ORAL_TABLET | Freq: Two times a day (BID) | ORAL | 0 refills | Status: DC

## 2016-10-07 NOTE — Telephone Encounter (Signed)
Faxed records to [facility] [phone] °

## 2016-10-07 NOTE — Telephone Encounter (Signed)
Spoke with pt and informed pt that Dr. Burr Medico had escribed Cipro to pt's pharmacy.  Pt stated she had Cipro before and tolerated fine.

## 2016-10-07 NOTE — Progress Notes (Unsigned)
cipro

## 2016-10-09 ENCOUNTER — Encounter (HOSPITAL_COMMUNITY): Payer: Self-pay | Admitting: Emergency Medicine

## 2016-10-09 ENCOUNTER — Emergency Department (HOSPITAL_COMMUNITY): Payer: 59

## 2016-10-09 ENCOUNTER — Inpatient Hospital Stay (HOSPITAL_COMMUNITY)
Admission: EM | Admit: 2016-10-09 | Discharge: 2016-10-20 | DRG: 329 | Disposition: A | Discharge status: Home Health | Payer: 59 | Attending: Internal Medicine | Admitting: Internal Medicine

## 2016-10-09 DIAGNOSIS — E871 Hypo-osmolality and hyponatremia: Secondary | ICD-10-CM | POA: Diagnosis not present

## 2016-10-09 DIAGNOSIS — Z6822 Body mass index (BMI) 22.0-22.9, adult: Secondary | ICD-10-CM

## 2016-10-09 DIAGNOSIS — K219 Gastro-esophageal reflux disease without esophagitis: Secondary | ICD-10-CM | POA: Diagnosis present

## 2016-10-09 DIAGNOSIS — C189 Malignant neoplasm of colon, unspecified: Secondary | ICD-10-CM

## 2016-10-09 DIAGNOSIS — Z91040 Latex allergy status: Secondary | ICD-10-CM

## 2016-10-09 DIAGNOSIS — E46 Unspecified protein-calorie malnutrition: Secondary | ICD-10-CM | POA: Diagnosis present

## 2016-10-09 DIAGNOSIS — R1084 Generalized abdominal pain: Secondary | ICD-10-CM | POA: Diagnosis present

## 2016-10-09 DIAGNOSIS — E8809 Other disorders of plasma-protein metabolism, not elsewhere classified: Secondary | ICD-10-CM | POA: Diagnosis present

## 2016-10-09 DIAGNOSIS — R739 Hyperglycemia, unspecified: Secondary | ICD-10-CM | POA: Diagnosis present

## 2016-10-09 DIAGNOSIS — M797 Fibromyalgia: Secondary | ICD-10-CM | POA: Diagnosis present

## 2016-10-09 DIAGNOSIS — D509 Iron deficiency anemia, unspecified: Secondary | ICD-10-CM | POA: Diagnosis present

## 2016-10-09 DIAGNOSIS — R109 Unspecified abdominal pain: Secondary | ICD-10-CM

## 2016-10-09 DIAGNOSIS — C182 Malignant neoplasm of ascending colon: Secondary | ICD-10-CM | POA: Diagnosis not present

## 2016-10-09 DIAGNOSIS — R180 Malignant ascites: Secondary | ICD-10-CM

## 2016-10-09 DIAGNOSIS — C180 Malignant neoplasm of cecum: Secondary | ICD-10-CM | POA: Diagnosis present

## 2016-10-09 DIAGNOSIS — E43 Unspecified severe protein-calorie malnutrition: Secondary | ICD-10-CM | POA: Diagnosis present

## 2016-10-09 DIAGNOSIS — R52 Pain, unspecified: Secondary | ICD-10-CM

## 2016-10-09 DIAGNOSIS — K439 Ventral hernia without obstruction or gangrene: Secondary | ICD-10-CM | POA: Diagnosis present

## 2016-10-09 DIAGNOSIS — C187 Malignant neoplasm of sigmoid colon: Secondary | ICD-10-CM | POA: Diagnosis not present

## 2016-10-09 DIAGNOSIS — Z880 Allergy status to penicillin: Secondary | ICD-10-CM

## 2016-10-09 DIAGNOSIS — R627 Adult failure to thrive: Secondary | ICD-10-CM | POA: Diagnosis present

## 2016-10-09 DIAGNOSIS — C7800 Secondary malignant neoplasm of unspecified lung: Secondary | ICD-10-CM | POA: Diagnosis present

## 2016-10-09 DIAGNOSIS — Z888 Allergy status to other drugs, medicaments and biological substances status: Secondary | ICD-10-CM

## 2016-10-09 DIAGNOSIS — C787 Secondary malignant neoplasm of liver and intrahepatic bile duct: Secondary | ICD-10-CM | POA: Diagnosis present

## 2016-10-09 DIAGNOSIS — K56600 Partial intestinal obstruction, unspecified as to cause: Secondary | ICD-10-CM | POA: Diagnosis present

## 2016-10-09 DIAGNOSIS — D638 Anemia in other chronic diseases classified elsewhere: Secondary | ICD-10-CM | POA: Diagnosis present

## 2016-10-09 DIAGNOSIS — C786 Secondary malignant neoplasm of retroperitoneum and peritoneum: Secondary | ICD-10-CM

## 2016-10-09 DIAGNOSIS — Z9884 Bariatric surgery status: Secondary | ICD-10-CM

## 2016-10-09 DIAGNOSIS — N390 Urinary tract infection, site not specified: Secondary | ICD-10-CM | POA: Clinically undetermined

## 2016-10-09 DIAGNOSIS — C801 Malignant (primary) neoplasm, unspecified: Secondary | ICD-10-CM

## 2016-10-09 DIAGNOSIS — E876 Hypokalemia: Secondary | ICD-10-CM | POA: Diagnosis present

## 2016-10-09 DIAGNOSIS — F419 Anxiety disorder, unspecified: Secondary | ICD-10-CM | POA: Diagnosis present

## 2016-10-09 DIAGNOSIS — K56609 Unspecified intestinal obstruction, unspecified as to partial versus complete obstruction: Secondary | ICD-10-CM

## 2016-10-09 DIAGNOSIS — F329 Major depressive disorder, single episode, unspecified: Secondary | ICD-10-CM | POA: Diagnosis present

## 2016-10-09 HISTORY — DX: Malignant (primary) neoplasm, unspecified: C80.1

## 2016-10-09 HISTORY — DX: Constipation, unspecified: K59.00

## 2016-10-09 LAB — CBC WITH DIFFERENTIAL/PLATELET
Basophils Absolute: 0 10*3/uL (ref 0.0–0.1)
Basophils Relative: 0 %
EOS ABS: 0 10*3/uL (ref 0.0–0.7)
EOS PCT: 1 %
HCT: 28.5 % — ABNORMAL LOW (ref 36.0–46.0)
HEMOGLOBIN: 9.1 g/dL — AB (ref 12.0–15.0)
LYMPHS ABS: 0.9 10*3/uL (ref 0.7–4.0)
LYMPHS PCT: 14 %
MCH: 26.4 pg (ref 26.0–34.0)
MCHC: 31.9 g/dL (ref 30.0–36.0)
MCV: 82.6 fL (ref 78.0–100.0)
MONOS PCT: 19 %
Monocytes Absolute: 1.2 10*3/uL — ABNORMAL HIGH (ref 0.1–1.0)
NEUTROS PCT: 66 %
Neutro Abs: 4.2 10*3/uL (ref 1.7–7.7)
Platelets: 364 10*3/uL (ref 150–400)
RBC: 3.45 MIL/uL — ABNORMAL LOW (ref 3.87–5.11)
RDW: 16 % — ABNORMAL HIGH (ref 11.5–15.5)
WBC: 6.4 10*3/uL (ref 4.0–10.5)

## 2016-10-09 LAB — COMPREHENSIVE METABOLIC PANEL
ALK PHOS: 116 U/L (ref 38–126)
ALT: 19 U/L (ref 14–54)
ANION GAP: 9 (ref 5–15)
AST: 24 U/L (ref 15–41)
Albumin: 2.6 g/dL — ABNORMAL LOW (ref 3.5–5.0)
BUN: 7 mg/dL (ref 6–20)
CALCIUM: 9 mg/dL (ref 8.9–10.3)
CO2: 28 mmol/L (ref 22–32)
CREATININE: 0.6 mg/dL (ref 0.44–1.00)
Chloride: 92 mmol/L — ABNORMAL LOW (ref 101–111)
Glucose, Bld: 112 mg/dL — ABNORMAL HIGH (ref 65–99)
Potassium: 4.3 mmol/L (ref 3.5–5.1)
SODIUM: 129 mmol/L — AB (ref 135–145)
Total Bilirubin: 0.6 mg/dL (ref 0.3–1.2)
Total Protein: 6.8 g/dL (ref 6.5–8.1)

## 2016-10-09 LAB — URINALYSIS, ROUTINE W REFLEX MICROSCOPIC
Bacteria, UA: NONE SEEN
Bilirubin Urine: NEGATIVE
GLUCOSE, UA: NEGATIVE mg/dL
HGB URINE DIPSTICK: NEGATIVE
Ketones, ur: 20 mg/dL — AB
Leukocytes, UA: NEGATIVE
NITRITE: NEGATIVE
PH: 8 (ref 5.0–8.0)
Protein, ur: 100 mg/dL — AB
SPECIFIC GRAVITY, URINE: 1.023 (ref 1.005–1.030)

## 2016-10-09 LAB — LIPASE, BLOOD

## 2016-10-09 LAB — I-STAT CG4 LACTIC ACID, ED: Lactic Acid, Venous: 0.53 mmol/L (ref 0.5–1.9)

## 2016-10-09 MED ORDER — ALTEPLASE 2 MG IJ SOLR
2.0000 mg | Freq: Once | INTRAMUSCULAR | Status: AC
  Administered 2016-10-09: 2 mg
  Filled 2016-10-09: qty 2

## 2016-10-09 MED ORDER — IOPAMIDOL (ISOVUE-300) INJECTION 61%
INTRAVENOUS | Status: AC
  Administered 2016-10-09: 80 mL via INTRAVENOUS
  Filled 2016-10-09: qty 100

## 2016-10-09 MED ORDER — SODIUM CHLORIDE 0.9% FLUSH: 10.0000 mL | INTRAVENOUS | Status: DC | PRN

## 2016-10-09 MED ORDER — SODIUM CHLORIDE 0.9 % IV BOLUS (SEPSIS)
1000.0000 mL | Freq: Once | INTRAVENOUS | Status: AC
  Administered 2016-10-09: 1000 mL via INTRAVENOUS

## 2016-10-09 NOTE — ED Provider Notes (Signed)
[facility] DEPT Provider Note   CSN: [id number] Arrival date & time: 10/09/16  1430     History   Chief Complaint Chief Complaint  Patient presents with  . Constipation  . Abdominal Pain    HPI [name] is a 51 y.o. female.  The history is provided by the patient and medical records.     51 year old female with history of anemia, anxiety, colon cancer, depression, fibromyalgia, GERD, presenting to the ED for abdominal pain and fever. Patient reports fever has been ongoing for the past week or so. States yesterday tmax was 101F.  Also reports she has not had a BM in about 10 days despite taking stool softeners and laxatives.  States she is able to pass some gas if she forces it out.  States her abdomen is feeling more distended now.  States generally she has BM's every other day or so.  States she has been able to eat, but has to do so very slowly and she has early satiety.  Husband reports it took her almost an hour to eat a bowl of chicken noodle soup recently. Patient was evaluated by her oncology team on 10/04/2016 for fever and abdominal pain as well. She was notified her urine culture was positive and so they started her on ciprofloxacin. Reports she continues having fevers. She's not had any changes to her chemotherapy regimen, last treatment was given 09/29/16.  She is on home oxycodone for pain management.  Past Medical History:  Diagnosis Date  . Anemia   . Anxiety   . Cancer ([place])    colon cancer stage 3  . Depression   . Fibromyalgia   . GERD (gastroesophageal reflux disease)   . JP drain bleeding     Patient Active Problem List   Diagnosis Date Noted  . Port catheter in place 06/23/2016  . Hand foot syndrome 01/08/2016  . Open abdominal wall wound 10/12/2015  . Hyperphosphatemia 09/22/2015  . Hypoalbuminemia due to protein-calorie malnutrition ([place]) 09/22/2015  . Insomnia 09/22/2015  . Dehydration 09/21/2015  . Hyponatremia 09/21/2015  . Hypotension  09/21/2015  . Transaminitis 09/21/2015  . Anemia, iron deficiency 09/19/2015  . Cancer of right colon ([place]) 09/04/2015  . S/P colectomy 08/03/2015  . Appendicitis, acute   . Perforated appendicitis 04/02/2015  . Appendiceal abscess     Past Surgical History:  Procedure Laterality Date  . ABDOMINAL HYSTERECTOMY    . COLON SURGERY  08/03/2015  . COLOSTOMY REVISION N/A 08/03/2015   Procedure: COLON RESECTION RIGHT;  Surgeon: Rolm Bookbinder, MD;  Location: [facility];  Service: General;  Laterality: N/A;  . GASTRIC BYPASS    . IR GENERIC HISTORICAL  05/30/2016   IR FLUORO GUIDE PORT INSERTION RIGHT 05/30/2016 Aletta Edouard, MD [facility]  . IR GENERIC HISTORICAL  05/30/2016   IR US GUIDE VASC ACCESS RIGHT 05/30/2016 Aletta Edouard, MD [facility]  . LAPAROSCOPIC ILEOCECECTOMY Right 08/03/2015   Procedure: LAPAROSCOPIC DIAGNOSTIC RIGHT COLECTOMY;  Surgeon: Rolm Bookbinder, MD;  Location: [facility];  Service: General;  Laterality: Right;    OB History    No data available       Home Medications    Prior to Admission medications   Medication Sig Start Date End Date Taking? Authorizing Provider  acetaminophen (TYLENOL) 500 MG tablet Take 1,000 mg by mouth every 6 (six) hours as needed for fever. Reported on 09/24/2015    Historical Provider, MD  ALPRAZolam Duanne Moron) 0.25 MG tablet Take 1-2 tablets (0.25-0.5 mg total)  by mouth at bedtime as needed for anxiety or sleep. 09/15/16   Truitt Merle, MD  amoxicillin-clavulanate (AUGMENTIN) 875-125 MG tablet Take 1 tablet by mouth 2 (two) times daily. Take 1 tab Twice daily  For  5  Days. 10/06/16   Truitt Merle, MD  bisacodyl (DULCOLAX) 5 MG EC tablet Take 10 mg by mouth daily as needed for moderate constipation.    Historical Provider, MD  ciprofloxacin (CIPRO) 500 MG tablet Take 1 tablet (500 mg total) by mouth 2 (two) times daily. 10/07/16   Truitt Merle, MD  cyclobenzaprine (FLEXERIL) 10 MG tablet Take 1 tablet (10 mg total) by mouth 3 (three) times daily as  needed for muscle spasms. 03/18/16   Truitt Merle, MD  dexamethasone (DECADRON) 4 MG tablet Take 2 tablets (8 mg total) by mouth daily. Start day after chemo and take for 3 days after each IV chemo Patient not taking: Reported on 09/15/2016 09/14/15   Truitt Merle, MD  diphenhydrAMINE (BENADRYL) 25 mg capsule Take 25 mg by mouth every 6 (six) hours as needed for sleep.    Historical Provider, MD  docusate sodium (COLACE) 100 MG capsule Take 300 mg by mouth daily.    Historical Provider, MD  escitalopram (LEXAPRO) 10 MG tablet Take 10 mg by mouth daily.    Historical Provider, MD  ferrous sulfate 325 (65 FE) MG tablet Take 325 mg by mouth 2 (two) times daily with a meal. Reported on 10/12/2015    Historical Provider, MD  lidocaine-prilocaine (EMLA) cream Apply 1 application topically as needed. 05/23/16   Truitt Merle, MD  ondansetron (ZOFRAN) 8 MG tablet Take 1 tablet (8 mg total) by mouth 2 (two) times daily as needed for refractory nausea / vomiting. Start on day 3 after chemotherapy. Patient not taking: Reported on 09/15/2016 05/23/16   Truitt Merle, MD  Oxycodone HCl 10 MG TABS Take 1 tablet (10 mg total) by mouth every 4 (four) hours. 09/29/16   Susanne Borders, NP  prochlorperazine (COMPAZINE) 10 MG tablet Take 1 tablet (10 mg total) by mouth every 6 (six) hours as needed (NAUSEA). 05/23/16   Truitt Merle, MD  traMADol (ULTRAM) 50 MG tablet Take 1-2 tablets (50-100 mg total) by mouth every 6 (six) hours as needed. 04/20/16   Truitt Merle, MD  urea (CARMOL) 10 % cream Apply topically as needed. Patient not taking: Reported on 10/04/2016 12/18/15   Truitt Merle, MD  vitamin B-12 (CYANOCOBALAMIN) 1000 MCG tablet Take 500 mcg by mouth daily. Reported on 09/24/2015    Historical Provider, MD  vitamin C (ASCORBIC ACID) 500 MG tablet Take 500 mg by mouth daily.    Historical Provider, MD    Family History Family History  Problem Relation Age of Onset  . Cancer Father 7    small cell lung cancer and colon cancer   . Cancer Sister 50     small cell lung cancer  . Cancer Maternal Aunt     ovarian cancer  . Cancer Paternal Uncle     brain cancer   . Cancer Other 40    ovarian cancer (maternal niece)    Social History Social History  Substance Use Topics  . Smoking status: Never Smoker  . Smokeless tobacco: Never Used  . Alcohol use 1.2 oz/week    2 Glasses of wine per week     Comment: moderate 1-2 times a month      Allergies   Latex; Cymbalta [duloxetine hcl]; and Penicillins   Review  of Systems Review of Systems  Constitutional: Positive for fever.  Gastrointestinal: Positive for abdominal distention, abdominal pain and constipation.  All other systems reviewed and are negative.    Physical Exam Updated Vital Signs BP (!) 116/92 (BP Location: Left Arm)   Pulse (!) 121   Temp 98.2 F (36.8 C) (Oral)   Resp 16   Ht 5' (1.524 m)   Wt 53.1 kg   SpO2 100%   BMI 22.85 kg/m   Physical Exam  Constitutional: She is oriented to person, place, and time. She appears well-developed and well-nourished.  Thin, frail, chronically ill appearing  HENT:  Head: Normocephalic and atraumatic.  Mouth/Throat: Oropharynx is clear and moist.  Dry mucous membranes  Eyes: Conjunctivae and EOM are normal. Pupils are equal, round, and reactive to light.  Neck: Normal range of motion.  Cardiovascular: Normal rate, regular rhythm and normal heart sounds.   Pulmonary/Chest: Effort normal and breath sounds normal. No respiratory distress. She has no wheezes.  Abdominal: Soft. Bowel sounds are normal. She exhibits distension.  Well-healed midline abdominal incision, umbilical hernia noted; abdomen does appear somewhat distended, but bowel sounds remain normal  Musculoskeletal: Normal range of motion.  Neurological: She is alert and oriented to person, place, and time.  Skin: Skin is warm and dry.  Psychiatric: She has a normal mood and affect.  Nursing note and vitals reviewed.    ED Treatments / Results   Labs (all labs ordered are listed, but only abnormal results are displayed) Labs Reviewed  CBC WITH DIFFERENTIAL/PLATELET - Abnormal; Notable for the following:       Result Value   RBC 3.45 (*)    Hemoglobin 9.1 (*)    HCT 28.5 (*)    RDW 16.0 (*)    Monocytes Absolute 1.2 (*)    All other components within normal limits  COMPREHENSIVE METABOLIC PANEL - Abnormal; Notable for the following:    Sodium 129 (*)    Chloride 92 (*)    Glucose, Bld 112 (*)    Albumin 2.6 (*)    All other components within normal limits  LIPASE, BLOOD - Abnormal; Notable for the following:    Lipase <10 (*)    All other components within normal limits  URINALYSIS, ROUTINE W REFLEX MICROSCOPIC - Abnormal; Notable for the following:    APPearance HAZY (*)    Ketones, ur 20 (*)    Protein, ur 100 (*)    Squamous Epithelial / LPF 0-5 (*)    All other components within normal limits  URINE CULTURE  CULTURE, BLOOD (ROUTINE X 2)  CULTURE, BLOOD (ROUTINE X 2)  I-STAT CG4 LACTIC ACID, ED  I-STAT CG4 LACTIC ACID, ED    EKG  EKG Interpretation None       Radiology Dg Chest 2 View  Result Date: 10/09/2016 CLINICAL DATA:  Colon cancer, constipation EXAM: CHEST  2 VIEW COMPARISON:  Chest CT 10/05/2016 FINDINGS: Port with tip in distal SVC. No pleural fluid, pulmonary edema, or infiltrate. No pneumothorax. Gas-filled loop of colon noted. IMPRESSION: No acute cardiopulmonary process. Electronically Signed   By: Suzy Bouchard M.D.   On: 10/09/2016 16:13   Ct Abdomen Pelvis W Contrast  Result Date: 10/09/2016 CLINICAL DATA:  Fever for 5 days. No bowel movement for 10 days. History of colon cancer. EXAM: CT ABDOMEN AND PELVIS WITH CONTRAST TECHNIQUE: Multidetector CT imaging of the abdomen and pelvis was performed using the standard protocol following bolus administration of intravenous contrast. CONTRAST:  80 ml ISOVUE-300 IOPAMIDOL (ISOVUE-300) INJECTION 61% COMPARISON:  10/05/2016 FINDINGS: Lower chest:  Mild dependent changes in the lung bases. Nodule in the right lung base measuring 4 mm diameter. No change since previous study. Additional nodules seen on prior study are not included within the field of view. Coronary artery calcifications. Hepatobiliary: Scalloping of the liver margin consistent with extrinsic compression from diffuse peritoneal metastases. No discrete focal liver lesions identified. Gallbladder and bile ducts are unremarkable. Portal veins are patent. Pancreas: Pancreas is atrophic but appears unremarkable. No inflammatory changes seen. Spleen: Scalloping of the spleen margin consistent with extrinsic compression from diffuse peritoneal metastasis. No discrete focal intrasplenic lesions. Adrenals/Urinary Tract: Right adrenal gland calcification likely representing area of old infection or hemorrhage. No discrete adrenal gland nodules. Kidneys are symmetrical with symmetrical and homogeneous nephrograms. No hydronephrosis or hydroureter. Bladder is decompressed. Stomach/Bowel: Postoperative changes with gastric bypass. There appears to have been a partial right hemicolectomy with an ileal colonic anastomosis. Contrast material is demonstrated in the colon suggesting no evidence of bowel obstruction. Bowel are mostly decompressed, some of the bowel loops demonstrate signal areas of wall thickening and edema. The remaining colon is moderately distended with air-fluid levels, progressing since previous study. In the sigmoid region, there are several focal areas of stricturing and wall thickening. This may be causing a degree of partial obstruction. Etiology of the areas of narrowing/ wall thickening in the small bowel and colon probably due to serosal implants from the peritoneal carcinomatosis. Superimposed inflammatory process not entirely excluded. Vascular/Lymphatic: Scattered calcification in the aorta. No aneurysm. Mild prominence of retroperitoneal lymph nodes nonspecific but unchanged.  Reproductive: Status post hysterectomy. No adnexal masses. Other: Diffuse peritoneal carcinomatosis with low-attenuation mass is demonstrated throughout the peritoneal. There is un ventral abdominal wall hernia with herniation of peritoneal tumor. Loculated fluid collections may be present consistent with some malignant ascites. Appearances are similar to previous study. Tumor deposits are demonstrated in the subcutaneous fat and musculature of the right flank anterior to the iliac spine. Musculoskeletal: No acute or significant osseous findings. IMPRESSION: Prominent diffuse peritoneal carcinomatosis with low-attenuation lesions seen throughout the peritoneal cavity and causing scalloping of the margins of the liver and spleen. Some loculated fluid collections may be present, likely due to malignant ascites. Appearances are similar to previous study. There appears to be partial colectomy with remaining colon distended and filled with contrast material and gas. Contrast material in the colon suggest no evidence of complete obstruction. Segmental areas of wall thickening and edema demonstrated in the small bowel and in the sigmoid colon, likely resulting from serosal implants of carcinomatosis. Superimposed inflammatory process not entirely excluded. Electronically Signed   By: Lucienne Capers M.D.   On: 10/09/2016 23:14    Procedures Procedures (including critical care time)  Medications Ordered in ED Medications  sodium chloride 0.9 % bolus 1,000 mL (not administered)     Initial Impression / Assessment and Plan / ED Course  I have reviewed the triage vital signs and the nursing notes.  Pertinent labs & imaging results that were available during my care of the patient were reviewed by me and considered in my medical decision making (see chart for details).  51 year old female with history of colon cancer, here with abdominal pain and fever. Seen a few days ago at oncology center and started on  Cipro for UTI. Reports continued fever yesterday up to 101. She is afebrile here, but appears chronically ill. Her mucous membranes are dry. Her abdomen does  appear mildly distended, she has a noted hernia as well. We'll plan for repeat labs, IV fluids, CT scan, and chest x-ray. Blood and urine cultures will be obtained.  7:11 PM Severe delay in labs and imaging studies.  Patient Only wants blood drawn from her port. Peripheral IV was placed to have cultures drawn which were obtained, however patient refused further blood draw from his peripheral IV. She requested that it be removed immediately. States her pains always "blow" when peripheral IVs are used. Her port was attempted to be accessed multiple times without blood return. Additional ED nurses have tried as well as IV team without success.and altepase ultimately had to be used.  Port will have to sit for 2 hours prior to port being accessed. Patient aware that this severe delay can cause issues in her overall care/well being, she still refuses to have peripheral IV used.  Patient's labs with continued decline in her sodium and albumin. Normal white count, normal lactate. CT scan finally able to be obtained without noted obstructive process, however she does appear to have some malignant ascites as well as some possible inflammatory process.  Patient has had some mild response to IVF.  She remains tachycardic which has been persistent over her past few doctor visits.  Considered PE given her known cancer, however denies chest pain or SOB.  CT of the chest 4 days ago without acute changes.  Feel she needs admission for continued hydration, follow-up on blood and urine cultures given continued fever.  abx held at this time as no obvious source of infection present and normal lactic acid and WBC count.  Discussed with hospitalist, Dr. Lorin Mercy-- will admit for ongoing care.  Final Clinical Impressions(s) / ED Diagnoses   Final diagnoses:  Abdominal pain,  unspecified abdominal location  Hyponatremia    New Prescriptions New Prescriptions   No medications on file     Larene Pickett, PA-C 10/10/16 0029    Duffy Bruce, MD 10/10/16 [phone]

## 2016-10-09 NOTE — H&P (Signed)
History and Physical    [name] PFX:[id number] DOB: 1966-05-16 DOA: 10/09/2016  PCP: Reginia Naas, MD Consultants:  [facility] - oncology Patient coming from: home - lives with fiance; NOKAudry Cooley, [phone]  Chief Complaint: abdominal pain, constipation  HPI: [name] is a 51 y.o. female with medical history significant of stage III colon cancer, chronic intermittent constipation, fibromyalgia, and depression/anxiety presenting with abdominal pain.  She reports that "they say my sodium's dropping.  I'm having bowel problems."  Upon further questioning, she reports constipation and has not had a BM in 10 days.  Has been taking laxatives and "IV medication in the ER" and still unable to poop.  Has not tried suppositories or enemas.  Has taken Mag citrate several times.  Off and on constipation chronically, even prior to cancer diagnosis.  Severe pain led to tonight's visit.  Fevers up to 101.7 1-2 weeks ago, afebrile this week.  No n/v.  Has not eaten in 2 days.  Feels very fatigued, sleepy.  Today she was wheezing a bit.     ED Course:  Ongoing decline in sodium and albumin.  CT with malignant ascites and possible inflammatory process.  Persistent tachycardia.  Normal lactate and WBC count so no antibiotics given.  Review of Systems: As per HPI; otherwise 10 point review of systems reviewed and negative.   Ambulatory Status:  Occasionally needs assistance from her fiance  Past Medical History:  Diagnosis Date  . Anemia   . Anxiety   . Cancer ([place])    colon cancer stage 3  . Constipation   . Depression   . Fibromyalgia   . GERD (gastroesophageal reflux disease)   . JP drain bleeding     Past Surgical History:  Procedure Laterality Date  . ABDOMINAL HYSTERECTOMY    . COLON SURGERY  08/03/2015  . COLOSTOMY REVISION N/A 08/03/2015   Procedure: COLON RESECTION RIGHT;  Surgeon: Rolm Bookbinder, MD;  Location: [facility];  Service: General;  Laterality: N/A;  . GASTRIC  BYPASS    . IR GENERIC HISTORICAL  05/30/2016   IR FLUORO GUIDE PORT INSERTION RIGHT 05/30/2016 Aletta Edouard, MD [facility]  . IR GENERIC HISTORICAL  05/30/2016   IR US GUIDE VASC ACCESS RIGHT 05/30/2016 Aletta Edouard, MD [facility]  . LAPAROSCOPIC ILEOCECECTOMY Right 08/03/2015   Procedure: LAPAROSCOPIC DIAGNOSTIC RIGHT COLECTOMY;  Surgeon: Rolm Bookbinder, MD;  Location: [facility];  Service: General;  Laterality: Right;    Social History   Social History  . Marital status: Significant Other    Spouse name: N/A  . Number of children: N/A  . Years of education: N/A   Occupational History  . underwriter support specialist    Social History Main Topics  . Smoking status: Never Smoker  . Smokeless tobacco: Never Used  . Alcohol use 1.2 oz/week    2 Glasses of wine per week     Comment: moderate 1-2 times a month   . Drug use: No  . Sexual activity: Not on file   Other Topics Concern  . Not on file   Social History Narrative   Single, fiance [name]   Works [employer] 11:30am to 8 pm    Allergies  Allergen Reactions  . Latex Hives  . Cymbalta [Duloxetine Hcl] Rash  . Penicillins Rash    Has patient had a PCN reaction causing immediate rash, facial/tongue/throat swelling, SOB or lightheadedness with hypotension: No Has patient had a PCN reaction causing severe rash involving mucus  membranes or skin necrosis: No Has patient had a PCN reaction that required hospitalization No Has patient had a PCN reaction occurring within the last 10 years: No If all of the above answers are "NO", then may proceed with Cephalosporin use.     Family History  Problem Relation Age of Onset  . Cancer Father 94    small cell lung cancer and colon cancer   . Cancer Sister 50    small cell lung cancer  . Cancer Maternal Aunt     ovarian cancer  . Cancer Paternal Uncle     brain cancer   . Cancer Other 40    ovarian cancer (maternal niece)    Prior to Admission  medications   Medication Sig Start Date End Date Taking? Authorizing Provider  acetaminophen (TYLENOL) 500 MG tablet Take 1,000 mg by mouth every 6 (six) hours as needed for fever. Reported on 09/24/2015   Yes Historical Provider, MD  ALPRAZolam Duanne Moron) 0.25 MG tablet Take 1-2 tablets (0.25-0.5 mg total) by mouth at bedtime as needed for anxiety or sleep. 09/15/16  Yes Truitt Merle, MD  bisacodyl (DULCOLAX) 5 MG EC tablet Take 10 mg by mouth daily as needed for moderate constipation.   Yes Historical Provider, MD  ciprofloxacin (CIPRO) 500 MG tablet Take 1 tablet (500 mg total) by mouth 2 (two) times daily. 10/07/16  Yes Truitt Merle, MD  cyclobenzaprine (FLEXERIL) 10 MG tablet Take 1 tablet (10 mg total) by mouth 3 (three) times daily as needed for muscle spasms. 03/18/16  Yes Truitt Merle, MD  diphenhydrAMINE (BENADRYL) 25 mg capsule Take 25 mg by mouth every 6 (six) hours as needed for sleep.   Yes Historical Provider, MD  docusate sodium (COLACE) 100 MG capsule Take 300 mg by mouth daily.   Yes Historical Provider, MD  ferrous sulfate 325 (65 FE) MG tablet Take 325 mg by mouth 2 (two) times daily with a meal. Reported on 10/12/2015   Yes Historical Provider, MD  magnesium citrate SOLN Take 1 Bottle by mouth once.   Yes Historical Provider, MD  Oxycodone HCl 10 MG TABS Take 1 tablet (10 mg total) by mouth every 4 (four) hours. 09/29/16  Yes Susanne Borders, NP  prochlorperazine (COMPAZINE) 10 MG tablet Take 1 tablet (10 mg total) by mouth every 6 (six) hours as needed (NAUSEA). 05/23/16  Yes Truitt Merle, MD  traMADol (ULTRAM) 50 MG tablet Take 1-2 tablets (50-100 mg total) by mouth every 6 (six) hours as needed. 04/20/16  Yes Truitt Merle, MD  vitamin B-12 (CYANOCOBALAMIN) 1000 MCG tablet Take 500 mcg by mouth daily. Reported on 09/24/2015   Yes Historical Provider, MD  vitamin C (ASCORBIC ACID) 500 MG tablet Take 500 mg by mouth daily.   Yes Historical Provider, MD  amoxicillin-clavulanate (AUGMENTIN) 875-125 MG tablet Take 1  tablet by mouth 2 (two) times daily. Take 1 tab Twice daily  For  5  Days. Patient not taking: Reported on 10/09/2016 10/06/16   Truitt Merle, MD  dexamethasone (DECADRON) 4 MG tablet Take 2 tablets (8 mg total) by mouth daily. Start day after chemo and take for 3 days after each IV chemo Patient not taking: Reported on 09/15/2016 09/14/15   Truitt Merle, MD  lidocaine-prilocaine (EMLA) cream Apply 1 application topically as needed. 05/23/16   Truitt Merle, MD  ondansetron (ZOFRAN) 8 MG tablet Take 1 tablet (8 mg total) by mouth 2 (two) times daily as needed for refractory nausea / vomiting. Start on  day 3 after chemotherapy. Patient not taking: Reported on 09/15/2016 05/23/16   Truitt Merle, MD  urea (CARMOL) 10 % cream Apply topically as needed. Patient not taking: Reported on 10/04/2016 12/18/15   Truitt Merle, MD    Physical Exam: Vitals:   10/09/16 2200 10/09/16 2250 10/09/16 2329 10/10/16 0014  BP: 114/83 118/86 123/89 112/77  Pulse: (!) 117 (!) 113 (!) 111 (!) 112  Resp:  18 18 18   Temp:   98.6 F (37 C)   TempSrc:   Oral   SpO2: 96% 100% 96% 98%  Weight:      Height:         General: Appears calm and comfortable and is NAD Eyes:  PERRL, EOMI, normal lids, iris ENT:  grossly normal hearing, lips & tongue, mmm Neck:  no LAD, masses or thyromegaly Cardiovascular:  Tachycardia, no m/r/g. No LE edema.  Respiratory:  CTA bilaterally, no w/r/r. Normal respiratory effort. Abdomen: midline distal surgical incision, well-healed.  Abdomen is diffusely tender and distended with no fluid wave but with apparent stool throughout Skin:  no rash or induration seen on limited exam Musculoskeletal:  grossly normal tone BUE/BLE, good ROM, no bony abnormality Psychiatric:  grossly normal mood and affect, speech fluent and appropriate, AOx3 Neurologic:  CN 2-12 grossly intact, moves all extremities in coordinated fashion, sensation intact  Labs on Admission: I have personally reviewed following labs and imaging  studies  CBC:  Recent Labs Lab 10/04/16 1327 10/09/16 1628  WBC 5.3 6.4  NEUTROABS 3.6 4.2  HGB 11.1* 9.1*  HCT 33.8* 28.5*  MCV 83.7 82.6  PLT 221 892   Basic Metabolic Panel:  Recent Labs Lab 10/04/16 1327 10/09/16 1628  NA 132* 129*  K 4.5 4.3  CL  --  92*  CO2 25 28  GLUCOSE 103 112*  BUN 8.1 7  CREATININE 0.7 0.60  CALCIUM 10.3 9.0   GFR: Estimated Creatinine Clearance: 59.1 mL/min (by C-G formula based on SCr of 0.6 mg/dL). Liver Function Tests:  Recent Labs Lab 10/04/16 1327 10/09/16 1628  AST 11 24  ALT 9 19  ALKPHOS 128 116  BILITOT 0.42 0.6  PROT 7.5 6.8  ALBUMIN 3.2* 2.6*    Recent Labs Lab 10/09/16 1628  LIPASE <10*   No results for input(s): AMMONIA in the last 168 hours. Coagulation Profile: No results for input(s): INR, PROTIME in the last 168 hours. Cardiac Enzymes: No results for input(s): CKTOTAL, CKMB, CKMBINDEX, TROPONINI in the last 168 hours. BNP (last 3 results) No results for input(s): PROBNP in the last 8760 hours. HbA1C: No results for input(s): HGBA1C in the last 72 hours. CBG: No results for input(s): GLUCAP in the last 168 hours. Lipid Profile: No results for input(s): CHOL, HDL, LDLCALC, TRIG, CHOLHDL, LDLDIRECT in the last 72 hours. Thyroid Function Tests: No results for input(s): TSH, T4TOTAL, FREET4, T3FREE, THYROIDAB in the last 72 hours. Anemia Panel: No results for input(s): VITAMINB12, FOLATE, FERRITIN, TIBC, IRON, RETICCTPCT in the last 72 hours. Urine analysis:    Component Value Date/Time   COLORURINE YELLOW 10/09/2016 1629   APPEARANCEUR HAZY (A) 10/09/2016 1629   LABSPEC 1.023 10/09/2016 1629   LABSPEC 1.010 10/04/2016 1519   PHURINE 8.0 10/09/2016 1629   GLUCOSEU NEGATIVE 10/09/2016 1629   GLUCOSEU Negative 10/04/2016 1519   HGBUR NEGATIVE 10/09/2016 1629   BILIRUBINUR NEGATIVE 10/09/2016 1629   BILIRUBINUR Negative 10/04/2016 1519   KETONESUR 20 (A) 10/09/2016 1629   PROTEINUR 100 (A)  10/09/2016 1629  UROBILINOGEN 0.2 10/04/2016 1519   NITRITE NEGATIVE 10/09/2016 1629   LEUKOCYTESUR NEGATIVE 10/09/2016 1629   LEUKOCYTESUR Negative 10/04/2016 1519    Creatinine Clearance: Estimated Creatinine Clearance: 59.1 mL/min (by C-G formula based on SCr of 0.6 mg/dL).  Sepsis Labs: @LABRCNTIP (procalcitonin:4,lacticidven:4) ) Recent Results (from the past 240 hour(s))  Urine Culture     Status: Abnormal   Collection Time: 10/04/16  3:19 PM  Result Value Ref Range Status   Urine Culture, Routine Final report (A)  Final   Urine Culture result 1 Comment (A)  Final    Comment: Beta hemolytic Streptococcus, group B 10,000-25,000 colony forming units per mL Penicillin and ampicillin are drugs of choice for treatment of beta-hemolytic streptococcal infections. Susceptibility testing of penicillins and other beta-lactam agents approved by the FDA for treatment of beta-hemolytic streptococcal infections need not be performed routinely because nonsusceptible isolates are extremely rare in any beta-hemolytic streptococcus and have not been reported for Streptococcus pyogenes (group A). (CLSI 2011)   Blood culture (routine x 2)     Status: None (Preliminary result)   Collection Time: 10/09/16  4:29 PM  Result Value Ref Range Status   Specimen Description BLOOD PORTA CATH  Final   Special Requests IN PEDIATRIC BOTTLE 2ML  Final   Culture PENDING  Incomplete   Report Status PENDING  Incomplete     Radiological Exams on Admission: Dg Chest 2 View  Result Date: 10/09/2016 CLINICAL DATA:  Colon cancer, constipation EXAM: CHEST  2 VIEW COMPARISON:  Chest CT 10/05/2016 FINDINGS: Port with tip in distal SVC. No pleural fluid, pulmonary edema, or infiltrate. No pneumothorax. Gas-filled loop of colon noted. IMPRESSION: No acute cardiopulmonary process. Electronically Signed   By: Suzy Bouchard M.D.   On: 10/09/2016 16:13   Ct Abdomen Pelvis W Contrast  Result Date:  10/09/2016 CLINICAL DATA:  Fever for 5 days. No bowel movement for 10 days. History of colon cancer. EXAM: CT ABDOMEN AND PELVIS WITH CONTRAST TECHNIQUE: Multidetector CT imaging of the abdomen and pelvis was performed using the standard protocol following bolus administration of intravenous contrast. CONTRAST:  80 ml ISOVUE-300 IOPAMIDOL (ISOVUE-300) INJECTION 61% COMPARISON:  10/05/2016 FINDINGS: Lower chest: Mild dependent changes in the lung bases. Nodule in the right lung base measuring 4 mm diameter. No change since previous study. Additional nodules seen on prior study are not included within the field of view. Coronary artery calcifications. Hepatobiliary: Scalloping of the liver margin consistent with extrinsic compression from diffuse peritoneal metastases. No discrete focal liver lesions identified. Gallbladder and bile ducts are unremarkable. Portal veins are patent. Pancreas: Pancreas is atrophic but appears unremarkable. No inflammatory changes seen. Spleen: Scalloping of the spleen margin consistent with extrinsic compression from diffuse peritoneal metastasis. No discrete focal intrasplenic lesions. Adrenals/Urinary Tract: Right adrenal gland calcification likely representing area of old infection or hemorrhage. No discrete adrenal gland nodules. Kidneys are symmetrical with symmetrical and homogeneous nephrograms. No hydronephrosis or hydroureter. Bladder is decompressed. Stomach/Bowel: Postoperative changes with gastric bypass. There appears to have been a partial right hemicolectomy with an ileal colonic anastomosis. Contrast material is demonstrated in the colon suggesting no evidence of bowel obstruction. Bowel are mostly decompressed, some of the bowel loops demonstrate signal areas of wall thickening and edema. The remaining colon is moderately distended with air-fluid levels, progressing since previous study. In the sigmoid region, there are several focal areas of stricturing and wall  thickening. This may be causing a degree of partial obstruction. Etiology of the areas of narrowing/  wall thickening in the small bowel and colon probably due to serosal implants from the peritoneal carcinomatosis. Superimposed inflammatory process not entirely excluded. Vascular/Lymphatic: Scattered calcification in the aorta. No aneurysm. Mild prominence of retroperitoneal lymph nodes nonspecific but unchanged. Reproductive: Status post hysterectomy. No adnexal masses. Other: Diffuse peritoneal carcinomatosis with low-attenuation mass is demonstrated throughout the peritoneal. There is un ventral abdominal wall hernia with herniation of peritoneal tumor. Loculated fluid collections may be present consistent with some malignant ascites. Appearances are similar to previous study. Tumor deposits are demonstrated in the subcutaneous fat and musculature of the right flank anterior to the iliac spine. Musculoskeletal: No acute or significant osseous findings. IMPRESSION: Prominent diffuse peritoneal carcinomatosis with low-attenuation lesions seen throughout the peritoneal cavity and causing scalloping of the margins of the liver and spleen. Some loculated fluid collections may be present, likely due to malignant ascites. Appearances are similar to previous study. There appears to be partial colectomy with remaining colon distended and filled with contrast material and gas. Contrast material in the colon suggest no evidence of complete obstruction. Segmental areas of wall thickening and edema demonstrated in the small bowel and in the sigmoid colon, likely resulting from serosal implants of carcinomatosis. Superimposed inflammatory process not entirely excluded. Electronically Signed   By: Lucienne Capers M.D.   On: 10/09/2016 23:14    EKG: Not done  Assessment/Plan Principal Problem:   Abdominal pain Active Problems:   Cancer of right colon ([facility]CC)   Hyponatremia   Hypoalbuminemia due to protein-calorie  malnutrition ([facility])   UTI (urinary tract infection)   Hyperglycemia   Abdominal pain in a patient with colon cancer with peritoneal carcinomatosis -There are a number of reasons why this patient might have pain -The cancer itself is the first big reason; she is taking opiates for pain control and her recent CT scans both 2 days prior and today both indicate relative stability of the disease process at this time -She is now on palliative chemotherapy - which could also cause abdominal pain as a side effect -Her other big issue appears to be constipation. -She has both structural - post-surgical changes as well as malignancy and the potential for adhesions - as well as functional - chronic opiate therapy - cause for constipation/obstruction -She has failed laxative therapy, including with magnesium citrate on several occasions -She has not yet tried an enema so will provide a soap suds enema tonight and assess for success in the AM -If unsuccessful, would suggest holding further laxatives and administering Relistor (she does not have evidence of GI obstruction and so this medication is not contraindicated) - but will defer to day team, perhaps in conjunction with GI consultation -If successful, she should experience relief within 3 days of use -If unsuccessful, she likely needs GI input -Could consider NG tube placement for gastric decompression - but her primary complaint is not N/V and so this seems less effective -Given that her treatment is palliative in nature, the patient and her fiance likely need ongoing counseling regarding the expectations for her disease and outcomes.  She may even benefit from referral to palliative care and/or hospice.  Hyponatremia -Sodium 129; 132 on 3/13; 138 on 3/8 and normal prior -This appears to be hypovolemic hyponatremia from lack of PO input -There may also be a component of SIADH -Will follow after IVF repletion  Hyperglycemia -Glucose 112 -Will follow  with fasting AM labs for now without further intervention  Malnutrition -Albumin 2.6; 3.2 on 3/13; 3.4 on 2/8 -  Consider nutrition consult once patient is on a regular diet (currently clears and advancing as tolerated)  UTI -UA today negative -Blood and urine cultures pending -Urine culture 3/19 + 10-25k colonies of group B strep -GBS is generally treated with PCN or Ampicillin, but the patient was treated with Cipro empirically prior to the culture returning -She does not report symptoms and her UA is clean -Will stop Cipro (she has had 3 days of therapy, which may be sufficient) -F/u urine culture and decide whether to treat if positive may simply be asymptomatic bacteriuria   DVT prophylaxis: Lovenox Code Status: Full - confirmed with patient/family Family Communication: I called and spoke with her fiance and explained the plan; he voiced agreement and appreciation Disposition Plan:  Home once clinically improved Consults called: None  Admission status: Admit - It is my clinical opinion that admission to [facility] is reasonable and necessary because this patient will require at least 2 midnights in the hospital to treat this condition based on the medical complexity of the problems presented.  Given the aforementioned information, the predictability of an adverse outcome is felt to be significant.    Karmen Bongo MD [facility]  If 7PM-7AM, please contact night-coverage [email or web address] Password [email or web address]  10/10/2016, 1:17 AM

## 2016-10-09 NOTE — ED Notes (Signed)
Pt stated that she did not want cultures drawn. PA made aware.

## 2016-10-09 NOTE — ED Triage Notes (Signed)
Pt from home with complaints of fever x 4-5 days. Pt also states she has not had a BM for about 10 days. Pt is taking many medications for this including mag citrate and laxatives. Pt states she has seen her oncologist for this. Pt is currently not febrile, but she took tylenol around 1000 today. Pt is tachycardic.

## 2016-10-09 NOTE — ED Notes (Signed)
Pt aware she needs to give a urine sample.

## 2016-10-09 NOTE — ED Notes (Signed)
Delay in lab collection. Pt allowed this RN to use ultrasound to stick for cultures, but requested that other labs be drawn from port. One set of cultures were obtained, When port was accessed, no blood return noted. IV team made aware.

## 2016-10-10 ENCOUNTER — Encounter (HOSPITAL_COMMUNITY): Payer: Self-pay | Admitting: Internal Medicine

## 2016-10-10 DIAGNOSIS — C801 Malignant (primary) neoplasm, unspecified: Secondary | ICD-10-CM | POA: Diagnosis not present

## 2016-10-10 DIAGNOSIS — C189 Malignant neoplasm of colon, unspecified: Secondary | ICD-10-CM | POA: Diagnosis not present

## 2016-10-10 DIAGNOSIS — E876 Hypokalemia: Secondary | ICD-10-CM | POA: Diagnosis present

## 2016-10-10 DIAGNOSIS — R180 Malignant ascites: Secondary | ICD-10-CM

## 2016-10-10 DIAGNOSIS — C187 Malignant neoplasm of sigmoid colon: Secondary | ICD-10-CM | POA: Diagnosis present

## 2016-10-10 DIAGNOSIS — F329 Major depressive disorder, single episode, unspecified: Secondary | ICD-10-CM | POA: Diagnosis not present

## 2016-10-10 DIAGNOSIS — D630 Anemia in neoplastic disease: Secondary | ICD-10-CM | POA: Diagnosis not present

## 2016-10-10 DIAGNOSIS — Z880 Allergy status to penicillin: Secondary | ICD-10-CM | POA: Diagnosis not present

## 2016-10-10 DIAGNOSIS — M797 Fibromyalgia: Secondary | ICD-10-CM | POA: Diagnosis present

## 2016-10-10 DIAGNOSIS — D6481 Anemia due to antineoplastic chemotherapy: Secondary | ICD-10-CM

## 2016-10-10 DIAGNOSIS — C182 Malignant neoplasm of ascending colon: Secondary | ICD-10-CM

## 2016-10-10 DIAGNOSIS — C180 Malignant neoplasm of cecum: Secondary | ICD-10-CM | POA: Diagnosis present

## 2016-10-10 DIAGNOSIS — R739 Hyperglycemia, unspecified: Secondary | ICD-10-CM | POA: Diagnosis present

## 2016-10-10 DIAGNOSIS — E46 Unspecified protein-calorie malnutrition: Secondary | ICD-10-CM | POA: Diagnosis not present

## 2016-10-10 DIAGNOSIS — R52 Pain, unspecified: Secondary | ICD-10-CM

## 2016-10-10 DIAGNOSIS — E43 Unspecified severe protein-calorie malnutrition: Secondary | ICD-10-CM

## 2016-10-10 DIAGNOSIS — Z9884 Bariatric surgery status: Secondary | ICD-10-CM | POA: Diagnosis not present

## 2016-10-10 DIAGNOSIS — D509 Iron deficiency anemia, unspecified: Secondary | ICD-10-CM | POA: Diagnosis not present

## 2016-10-10 DIAGNOSIS — Z91040 Latex allergy status: Secondary | ICD-10-CM | POA: Diagnosis not present

## 2016-10-10 DIAGNOSIS — M791 Myalgia: Secondary | ICD-10-CM

## 2016-10-10 DIAGNOSIS — G893 Neoplasm related pain (acute) (chronic): Secondary | ICD-10-CM

## 2016-10-10 DIAGNOSIS — Z888 Allergy status to other drugs, medicaments and biological substances status: Secondary | ICD-10-CM | POA: Diagnosis not present

## 2016-10-10 DIAGNOSIS — C786 Secondary malignant neoplasm of retroperitoneum and peritoneum: Secondary | ICD-10-CM

## 2016-10-10 DIAGNOSIS — R109 Unspecified abdominal pain: Secondary | ICD-10-CM | POA: Diagnosis not present

## 2016-10-10 DIAGNOSIS — E871 Hypo-osmolality and hyponatremia: Secondary | ICD-10-CM

## 2016-10-10 DIAGNOSIS — K219 Gastro-esophageal reflux disease without esophagitis: Secondary | ICD-10-CM | POA: Diagnosis present

## 2016-10-10 DIAGNOSIS — R1084 Generalized abdominal pain: Secondary | ICD-10-CM | POA: Diagnosis present

## 2016-10-10 DIAGNOSIS — R627 Adult failure to thrive: Secondary | ICD-10-CM | POA: Diagnosis present

## 2016-10-10 DIAGNOSIS — F419 Anxiety disorder, unspecified: Secondary | ICD-10-CM | POA: Diagnosis present

## 2016-10-10 DIAGNOSIS — K56609 Unspecified intestinal obstruction, unspecified as to partial versus complete obstruction: Secondary | ICD-10-CM | POA: Diagnosis not present

## 2016-10-10 DIAGNOSIS — C7800 Secondary malignant neoplasm of unspecified lung: Secondary | ICD-10-CM | POA: Diagnosis present

## 2016-10-10 DIAGNOSIS — K56600 Partial intestinal obstruction, unspecified as to cause: Secondary | ICD-10-CM | POA: Diagnosis present

## 2016-10-10 DIAGNOSIS — N390 Urinary tract infection, site not specified: Secondary | ICD-10-CM | POA: Clinically undetermined

## 2016-10-10 DIAGNOSIS — K439 Ventral hernia without obstruction or gangrene: Secondary | ICD-10-CM | POA: Diagnosis present

## 2016-10-10 DIAGNOSIS — D638 Anemia in other chronic diseases classified elsewhere: Secondary | ICD-10-CM | POA: Diagnosis present

## 2016-10-10 DIAGNOSIS — C787 Secondary malignant neoplasm of liver and intrahepatic bile duct: Secondary | ICD-10-CM | POA: Diagnosis present

## 2016-10-10 LAB — BASIC METABOLIC PANEL
Anion gap: 7 (ref 5–15)
BUN: 5 mg/dL — ABNORMAL LOW (ref 6–20)
CHLORIDE: 97 mmol/L — AB (ref 101–111)
CO2: 26 mmol/L (ref 22–32)
CREATININE: 0.49 mg/dL (ref 0.44–1.00)
Calcium: 8.4 mg/dL — ABNORMAL LOW (ref 8.9–10.3)
GFR calc Af Amer: >60 mL/min (ref >60)
GFR calc non Af Amer: >60 mL/min (ref >60)
Glucose, Bld: 103 mg/dL — ABNORMAL HIGH (ref 65–99)
Potassium: 4.3 mmol/L (ref 3.5–5.1)
Sodium: 130 mmol/L — ABNORMAL LOW (ref 135–145)

## 2016-10-10 LAB — CBC
HCT: 26.5 % — ABNORMAL LOW (ref 36.0–46.0)
Hemoglobin: 8.5 g/dL — ABNORMAL LOW (ref 12.0–15.0)
MCH: 26.6 pg (ref 26.0–34.0)
MCHC: 32.1 g/dL (ref 30.0–36.0)
MCV: 82.8 fL (ref 78.0–100.0)
PLATELETS: 335 10*3/uL (ref 150–400)
RBC: 3.2 MIL/uL — ABNORMAL LOW (ref 3.87–5.11)
RDW: 15.9 % — ABNORMAL HIGH (ref 11.5–15.5)
WBC: 5.7 10*3/uL (ref 4.0–10.5)

## 2016-10-10 LAB — CREATININE, URINE, RANDOM: Creatinine, Urine: 34.53 mg/dL

## 2016-10-10 LAB — OSMOLALITY, URINE: Osmolality, Ur: 337 mOsm/kg (ref 300–900)

## 2016-10-10 LAB — HIV ANTIBODY (ROUTINE TESTING W REFLEX): HIV Screen 4th Generation wRfx: NONREACTIVE

## 2016-10-10 LAB — SODIUM, URINE, RANDOM: Sodium, Ur: 108 mmol/L

## 2016-10-10 LAB — OSMOLALITY: Osmolality: 270 mOsm/kg — ABNORMAL LOW (ref 275–295)

## 2016-10-10 MED ORDER — CYCLOBENZAPRINE HCL 10 MG PO TABS
10.0000 mg | ORAL_TABLET | Freq: Three times a day (TID) | ORAL | Status: DC | PRN
  Administered 2016-10-11 – 2016-10-18 (×5): 10 mg via ORAL
  Filled 2016-10-10 (×6): qty 1

## 2016-10-10 MED ORDER — ACETAMINOPHEN 325 MG PO TABS
650.0000 mg | ORAL_TABLET | Freq: Four times a day (QID) | ORAL | Status: DC | PRN
Start: 2016-10-10 — End: 2016-10-20
  Filled 2016-10-10: qty 2

## 2016-10-10 MED ORDER — ALPRAZOLAM 0.25 MG PO TABS
0.2500 mg | ORAL_TABLET | Freq: Every evening | ORAL | Status: DC | PRN
  Administered 2016-10-11: 0.25 mg via ORAL
  Administered 2016-10-13 – 2016-10-17 (×3): 0.5 mg via ORAL
  Filled 2016-10-10: qty 2
  Filled 2016-10-10: qty 1
  Filled 2016-10-10 (×4): qty 2

## 2016-10-10 MED ORDER — DIPHENHYDRAMINE HCL 25 MG PO CAPS: 25.0000 mg | ORAL_CAPSULE | Freq: Four times a day (QID) | ORAL | Status: DC | PRN

## 2016-10-10 MED ORDER — ACETAMINOPHEN 650 MG RE SUPP: 650.0000 mg | Freq: Four times a day (QID) | RECTAL | Status: DC | PRN

## 2016-10-10 MED ORDER — ONDANSETRON HCL 4 MG/2ML IJ SOLN
4.0000 mg | Freq: Four times a day (QID) | INTRAMUSCULAR | Status: DC | PRN
  Administered 2016-10-14: 4 mg via INTRAVENOUS
  Filled 2016-10-10: qty 2

## 2016-10-10 MED ORDER — TRAMADOL HCL 50 MG PO TABS: 50.0000 mg | ORAL_TABLET | Freq: Four times a day (QID) | ORAL | Status: DC | PRN

## 2016-10-10 MED ORDER — ENOXAPARIN SODIUM 40 MG/0.4ML ~~LOC~~ SOLN
40.0000 mg | Freq: Every day | SUBCUTANEOUS | Status: DC
  Administered 2016-10-10: 40 mg via SUBCUTANEOUS
  Filled 2016-10-10 (×3): qty 0.4

## 2016-10-10 MED ORDER — METHYLNALTREXONE BROMIDE 12 MG/0.6ML ~~LOC~~ SOLN: 12.0000 mg | Freq: Once | SUBCUTANEOUS | Status: DC

## 2016-10-10 MED ORDER — ONDANSETRON HCL 4 MG PO TABS: 4.0000 mg | ORAL_TABLET | Freq: Four times a day (QID) | ORAL | Status: DC | PRN

## 2016-10-10 MED ORDER — CIPROFLOXACIN HCL 500 MG PO TABS: 500.0000 mg | ORAL_TABLET | Freq: Two times a day (BID) | ORAL | Status: DC

## 2016-10-10 MED ORDER — FLEET ENEMA 7-19 GM/118ML RE ENEM
1.0000 | ENEMA | Freq: Once | RECTAL | Status: AC
  Administered 2016-10-10: 1 via RECTAL
  Filled 2016-10-10: qty 1

## 2016-10-10 MED ORDER — SODIUM CHLORIDE 0.9 % IV SOLN
INTRAVENOUS | Status: DC
  Administered 2016-10-10 (×2): via INTRAVENOUS
  Administered 2016-10-10 – 2016-10-11 (×2): 1000 mL via INTRAVENOUS
  Administered 2016-10-11 – 2016-10-12 (×2): via INTRAVENOUS
  Administered 2016-10-12 (×2): 1000 mL via INTRAVENOUS
  Administered 2016-10-14 – 2016-10-15 (×2): via INTRAVENOUS
  Administered 2016-10-18: 1000 mL via INTRAVENOUS

## 2016-10-10 MED ORDER — DOCUSATE SODIUM 100 MG PO CAPS
100.0000 mg | ORAL_CAPSULE | Freq: Two times a day (BID) | ORAL | Status: DC
  Administered 2016-10-10 – 2016-10-14 (×10): 100 mg via ORAL
  Filled 2016-10-10 (×10): qty 1

## 2016-10-10 MED ORDER — OXYCODONE HCL 5 MG PO TABS
10.0000 mg | ORAL_TABLET | ORAL | Status: DC
  Administered 2016-10-10 – 2016-10-13 (×22): 10 mg via ORAL
  Filled 2016-10-10 (×26): qty 2

## 2016-10-10 MED ORDER — PROCHLORPERAZINE MALEATE 10 MG PO TABS: 10.0000 mg | ORAL_TABLET | Freq: Four times a day (QID) | ORAL | Status: DC | PRN

## 2016-10-10 NOTE — Progress Notes (Signed)
CSW consulted to assist with HCPOA. [facility] is assisting with this request.  CSW signing off.  Werner Lean LCSW [phone]

## 2016-10-10 NOTE — Progress Notes (Signed)
[name]   DOB:1966/01/20   UX#:[id number]   GUR#:[id number]  ONCOLOGY FOLLOW UP   Subjective: Pt is well-known to me, was admitted for worsening abdominal pain and bowel obstruction. She feels little better today, has not had a bowel movement for 7 days. No significant nausea.  Objective:  Vitals:   10/10/16 0451 10/10/16 1506  BP: 124/86 (!) 130/93  Pulse: (!) 117 (!) 110  Resp: 18 16  Temp: 98.7 F (37.1 C) 98.3 F (36.8 C)    Body mass index is 24.11 kg/m.  Intake/Output Summary (Last 24 hours) at 10/10/16 2159 Last data filed at 10/10/16 1920  Gross per 24 hour  Intake              840 ml  Output              600 ml  Net              240 ml     Sclerae unicteric  Oropharynx clear  No peripheral adenopathy  Lungs clear -- no rales or rhonchi  Heart regular rate and rhythm  Abdomen distended, diffuse tenderness   MSK no focal spinal tenderness, no peripheral edema  Neuro nonfocal   CBG (last 3)  No results for input(s): GLUCAP in the last 72 hours.   Labs:  Lab Results  Component Value Date   WBC 5.7 10/10/2016   HGB 8.5 (L) 10/10/2016   HCT 26.5 (L) 10/10/2016   MCV 82.8 10/10/2016   PLT 335 10/10/2016   NEUTROABS 4.2 10/09/2016   CMP Latest Ref Rng & Units 10/10/2016 10/09/2016 10/04/2016  Glucose 65 - 99 mg/dL 103(H) 112(H) 103  BUN 6 - 20 mg/dL 5(L) 7 8.1  Creatinine 0.44 - 1.00 mg/dL 0.49 0.60 0.7  Sodium 135 - 145 mmol/L 130(L) 129(L) 132(L)  Potassium 3.5 - 5.1 mmol/L 4.3 4.3 4.5  Chloride 101 - 111 mmol/L 97(L) 92(L) -  CO2 22 - 32 mmol/L 26 28 25   Calcium 8.9 - 10.3 mg/dL 8.4(L) 9.0 10.3  Total Protein 6.5 - 8.1 g/dL - 6.8 7.5  Total Bilirubin 0.3 - 1.2 mg/dL - 0.6 0.42  Alkaline Phos 38 - 126 U/L - 116 128  AST 15 - 41 U/L - 24 11  ALT 14 - 54 U/L - 19 9    Urine Studies No results for input(s): UHGB, CRYS in the last 72 hours.  Invalid input(s): UACOL, UAPR, USPG, UPH, UTP, UGL, UKET, UBIL, UNIT, UROB, ULEU, UEPI, UWBC, URBC, UBAC,  CAST, UCOM, [place]  Basic Metabolic Panel:  Recent Labs Lab 10/04/16 1327 10/09/16 1628 10/10/16 0502  NA 132* 129* 130*  K 4.5 4.3 4.3  CL  --  92* 97*  CO2 25 28 26   GLUCOSE 103 112* 103*  BUN 8.1 7 5*  CREATININE 0.7 0.60 0.49  CALCIUM 10.3 9.0 8.4*   GFR Estimated Creatinine Clearance: 64.5 mL/min (by C-G formula based on SCr of 0.49 mg/dL). Liver Function Tests:  Recent Labs Lab 10/04/16 1327 10/09/16 1628  AST 11 24  ALT 9 19  ALKPHOS 128 116  BILITOT 0.42 0.6  PROT 7.5 6.8  ALBUMIN 3.2* 2.6*    Recent Labs Lab 10/09/16 1628  LIPASE <10*   No results for input(s): AMMONIA in the last 168 hours. Coagulation profile No results for input(s): INR, PROTIME in the last 168 hours.  CBC:  Recent Labs Lab 10/04/16 1327 10/09/16 1628 10/10/16 0502  WBC 5.3 6.4 5.7  NEUTROABS 3.6  4.2  --   HGB 11.1* 9.1* 8.5*  HCT 33.8* 28.5* 26.5*  MCV 83.7 82.6 82.8  PLT 221 364 335   Cardiac Enzymes: No results for input(s): CKTOTAL, CKMB, CKMBINDEX, TROPONINI in the last 168 hours. BNP: Invalid input(s): POCBNP CBG: No results for input(s): GLUCAP in the last 168 hours. D-Dimer No results for input(s): DDIMER in the last 72 hours. Hgb A1c No results for input(s): HGBA1C in the last 72 hours. Lipid Profile No results for input(s): CHOL, HDL, LDLCALC, TRIG, CHOLHDL, LDLDIRECT in the last 72 hours. Thyroid function studies No results for input(s): TSH, T4TOTAL, T3FREE, THYROIDAB in the last 72 hours.  Invalid input(s): FREET3 Anemia work up No results for input(s): VITAMINB12, FOLATE, FERRITIN, TIBC, IRON, RETICCTPCT in the last 72 hours. Microbiology Recent Results (from the past 240 hour(s))  Urine Culture     Status: Abnormal   Collection Time: 10/04/16  3:19 PM  Result Value Ref Range Status   Urine Culture, Routine Final report (A)  Final   Urine Culture result 1 Comment (A)  Final    Comment: Beta hemolytic Streptococcus, group B 10,000-25,000 colony  forming units per mL Penicillin and ampicillin are drugs of choice for treatment of beta-hemolytic streptococcal infections. Susceptibility testing of penicillins and other beta-lactam agents approved by the FDA for treatment of beta-hemolytic streptococcal infections need not be performed routinely because nonsusceptible isolates are extremely rare in any beta-hemolytic streptococcus and have not been reported for Streptococcus pyogenes (group A). (CLSI 2011)   Blood culture (routine x 2)     Status: None (Preliminary result)   Collection Time: 10/09/16  4:29 PM  Result Value Ref Range Status   Specimen Description BLOOD RIGHT ANTECUBITAL  Final   Special Requests BOTTLES DRAWN AEROBIC AND ANAEROBIC 5 CC  Final   Culture   Final    NO GROWTH < 24 HOURS Performed at [facility], [place] [address], [place], [place] [address]    Report Status PENDING  Incomplete  Blood culture (routine x 2)     Status: None (Preliminary result)   Collection Time: 10/09/16  4:29 PM  Result Value Ref Range Status   Specimen Description BLOOD PORTA CATH  Final   Special Requests IN PEDIATRIC BOTTLE 2ML  Final   Culture   Final    NO GROWTH < 24 HOURS Performed at [facility], [place] [address] [place], [place] [address]    Report Status PENDING  Incomplete      Studies:  Dg Chest 2 View  Result Date: 10/09/2016 CLINICAL DATA:  Colon cancer, constipation EXAM: CHEST  2 VIEW COMPARISON:  Chest CT 10/05/2016 FINDINGS: Port with tip in distal SVC. No pleural fluid, pulmonary edema, or infiltrate. No pneumothorax. Gas-filled loop of colon noted. IMPRESSION: No acute cardiopulmonary process. Electronically Signed   By: Suzy Bouchard M.D.   On: 10/09/2016 16:13   Ct Abdomen Pelvis W Contrast  Result Date: 10/09/2016 CLINICAL DATA:  Fever for 5 days. No bowel movement for 10 days. History of colon cancer. EXAM: CT ABDOMEN AND PELVIS WITH CONTRAST TECHNIQUE: Multidetector CT imaging of the  abdomen and pelvis was performed using the standard protocol following bolus administration of intravenous contrast. CONTRAST:  80 ml ISOVUE-300 IOPAMIDOL (ISOVUE-300) INJECTION 61% COMPARISON:  10/05/2016 FINDINGS: Lower chest: Mild dependent changes in the lung bases. Nodule in the right lung base measuring 4 mm diameter. No change since previous study. Additional nodules seen on prior study are not  included within the field of view. Coronary artery calcifications. Hepatobiliary: Scalloping of the liver margin consistent with extrinsic compression from diffuse peritoneal metastases. No discrete focal liver lesions identified. Gallbladder and bile ducts are unremarkable. Portal veins are patent. Pancreas: Pancreas is atrophic but appears unremarkable. No inflammatory changes seen. Spleen: Scalloping of the spleen margin consistent with extrinsic compression from diffuse peritoneal metastasis. No discrete focal intrasplenic lesions. Adrenals/Urinary Tract: Right adrenal gland calcification likely representing area of old infection or hemorrhage. No discrete adrenal gland nodules. Kidneys are symmetrical with symmetrical and homogeneous nephrograms. No hydronephrosis or hydroureter. Bladder is decompressed. Stomach/Bowel: Postoperative changes with gastric bypass. There appears to have been a partial right hemicolectomy with an ileal colonic anastomosis. Contrast material is demonstrated in the colon suggesting no evidence of bowel obstruction. Bowel are mostly decompressed, some of the bowel loops demonstrate signal areas of wall thickening and edema. The remaining colon is moderately distended with air-fluid levels, progressing since previous study. In the sigmoid region, there are several focal areas of stricturing and wall thickening. This may be causing a degree of partial obstruction. Etiology of the areas of narrowing/ wall thickening in the small bowel and colon probably due to serosal implants from the  peritoneal carcinomatosis. Superimposed inflammatory process not entirely excluded. Vascular/Lymphatic: Scattered calcification in the aorta. No aneurysm. Mild prominence of retroperitoneal lymph nodes nonspecific but unchanged. Reproductive: Status post hysterectomy. No adnexal masses. Other: Diffuse peritoneal carcinomatosis with low-attenuation mass is demonstrated throughout the peritoneal. There is un ventral abdominal wall hernia with herniation of peritoneal tumor. Loculated fluid collections may be present consistent with some malignant ascites. Appearances are similar to previous study. Tumor deposits are demonstrated in the subcutaneous fat and musculature of the right flank anterior to the iliac spine. Musculoskeletal: No acute or significant osseous findings. IMPRESSION: Prominent diffuse peritoneal carcinomatosis with low-attenuation lesions seen throughout the peritoneal cavity and causing scalloping of the margins of the liver and spleen. Some loculated fluid collections may be present, likely due to malignant ascites. Appearances are similar to previous study. There appears to be partial colectomy with remaining colon distended and filled with contrast material and gas. Contrast material in the colon suggest no evidence of complete obstruction. Segmental areas of wall thickening and edema demonstrated in the small bowel and in the sigmoid colon, likely resulting from serosal implants of carcinomatosis. Superimposed inflammatory process not entirely excluded. Electronically Signed   By: Lucienne Capers M.D.   On: 10/09/2016 23:14    Assessment: 51 y.o. female, with past medical history of fibromyalgia, depression, presented with ruptured cecum and pericolonic abscess, required multiple drainage, antibiotics, and eventually right colectomy, which revealed stage IIIA colon adenocarcinoma. She developed peritoneal carcinomatosis in 04/2016.   1. Metastatic right colon cancer to peritoneum, on  palliative chemo 2. Partial bowel obstruction, likely secondary to peritoneum carcinomatosis 3. Abdominal pain secondary to #1 and #2 4. Anemia secondary to chemotherapy, iron deficiency and malignancy 5. Moderate to severe malnutrition 6. Fibromyalgia and depression   Plan:  -I reviewed her CT scan findings with pt  -Unfortunately this is a very difficult situation, I will talk to Dr. Redmond Pulling to see if surgery is an options for her bowel obstruction. Unfortunately due to her diffuse peritoneal carcinomatosis, surgery may not be an option -continue bowel rest and supportive care  -I do not have very good systemic therapy options for her either. If her bowel obstruction does not resolve, it will be almost impossible to continue chemo  -will follow  serial x-rays to see if her bowel obstruction resolves  -I will follow up.     Truitt Merle, MD 10/10/2016  9:59 PM

## 2016-10-10 NOTE — Progress Notes (Signed)
Thus far patient had avoided getting her enema. She did nit refuse, but keeps saying not right now.  Will offer again. Paged Chaplain to speak with patient concerning POA. Received a call back stating that someone would be here today to speak with patient.

## 2016-10-10 NOTE — ED Notes (Signed)
Attempted to call report to floor 

## 2016-10-10 NOTE — Progress Notes (Signed)
Chaplain consulting on referral by nursing: pt with questions around advance directive process.   Namiyah explained that she is engaged to be married, but the wedding continues to be pushed back.  Wishes to appoint fiance as [place].    Chaplain provided paperwork and education around process.  Marcia would like to fill out paperwork with fiance.  She will have nursing page [facility] for notarization when they have completed paperwork.    [place], [place]

## 2016-10-10 NOTE — Consult Note (Signed)
Reason for Consult:Bowel obstruction with  Stage IIIB (T4a, N1a, cM0) colon cancer. Referring Physician: D Cooley  [name] is an 51 y.o. female.  HPI:  This is a complicated patient who presented with a ruptured appendix found in September 2016. She underwent abscess drainage percutaneously. She actually required multiple drain placements. She was taken to the operating room on 08/03/15 she was found to have a 2 cm hole in her cecum and underwent ileocecectomy with anastomosis in the hepatic flexure. 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 Patient came to the emergency department yesterday with complaints of fever 4-5 days. She has not had a bowel movement for 10 days. This is with mag citrate and other laxatives she's been taking during this time. Reports some nausea and small amounts of emesis. Getting palliative chemotherapy. No flatus since weekend.    Surgical pathology revealed a adenocarcinoma in the cecum. Peritoneum, biopsy, left lower quadrant mesentery ADENOCARCINOMA WITH ABUNDANT EXTRACELLULAR MUCIN CONSISTENT WITH COLONIC PRIMARY. She had some postop issues with wound infection and was eventually seen in consultation by Dr. Truitt Merle.  She has undergone chemotherapy starting on 06/02/16. 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 The hypermetabolic mass is deep subcutaneous ventral right lateral pelvic wall was also most likely a metastasis.  She also has multiple pulmonary nodes thought most likely to be metastasis.  CT scan obtained yesterday on admission shows: Prominent diffuse peritoneal carcinomatosis with low-attenuation lesions seen throughout the peritoneal cavity and causing scalloping of the margins of the liver and spleen. Some loculated fluid collections may be present, likely due to malignant ascites. Appearances are similar to  previous study.  There appears to be partial colectomy with remaining colon distended and filled with contrast material and gas. Contrast material in the colon suggest no evidence of complete obstruction. Segmental areas of wall thickening and edema demonstrated in the small bowel and in the sigmoid colon, likely resulting from serosal implants of carcinomatosis. Superimposed inflammatory process not entirely  Labs show sodium 129, up to 1:30 this a.m. Normal renal function. WBC of 5.7, hemoglobin 8.5 hematocrit 26.5. Platelets are normal at 335,000.  Her last chemotherapy was on 09/29/16.  We are ask to see  Past Medical History:  Diagnosis Date  . Anemia   . Anxiety   . Cancer ([place])    colon cancer stage 3  . Constipation   . Depression   . Fibromyalgia   . GERD (gastroesophageal reflux disease)   . JP drain bleeding     Past Surgical History:  Procedure Laterality Date  . ABDOMINAL HYSTERECTOMY    . COLON SURGERY  08/03/2015  . COLOSTOMY REVISION N/A 08/03/2015   Procedure: COLON RESECTION RIGHT;  Surgeon: Rolm Bookbinder, MD;  Location: [facility];  Service: General;  Laterality: N/A;  . GASTRIC BYPASS    . IR GENERIC HISTORICAL  05/30/2016   IR FLUORO GUIDE PORT INSERTION RIGHT 05/30/2016 Aletta Edouard, MD [facility]  . IR GENERIC HISTORICAL  05/30/2016   IR US GUIDE VASC ACCESS RIGHT 05/30/2016 Aletta Edouard, MD [facility]  . LAPAROSCOPIC ILEOCECECTOMY Right 08/03/2015   Procedure: LAPAROSCOPIC DIAGNOSTIC RIGHT COLECTOMY;  Surgeon: Rolm Bookbinder, MD;  Location: [facility];  Service: General;  Laterality: Right;    Family History  Problem Relation Age of Onset  . Cancer Father 25    small cell lung cancer and colon cancer   . Cancer Sister 44  small cell lung cancer  . Cancer Maternal Aunt     ovarian cancer  . Cancer Paternal Uncle     brain cancer   . Cancer Other 40    ovarian cancer (maternal niece)    Social History:  reports that she has never smoked.  She has never used smokeless tobacco. She reports that she drinks about 1.2 oz of alcohol per week . She reports that she does not use drugs.  Allergies:  Allergies  Allergen Reactions  . Latex Hives  . Cymbalta [Duloxetine Hcl] Rash  . Penicillins Rash    Has patient had a PCN reaction causing immediate rash, facial/tongue/throat swelling, SOB or lightheadedness with hypotension: No Has patient had a PCN reaction causing severe rash involving mucus membranes or skin necrosis: No Has patient had a PCN reaction that required hospitalization No Has patient had a PCN reaction occurring within the last 10 years: No If all of the above answers are "NO", then may proceed with Cephalosporin use.     Medications:  Prior to Admission:  Prescriptions Prior to Admission  Medication Sig Dispense Refill Last Dose  . acetaminophen (TYLENOL) 500 MG tablet Take 1,000 mg by mouth every 6 (six) hours as needed for fever. Reported on 09/24/2015   10/09/2016 at Unknown time  . ALPRAZolam (XANAX) 0.25 MG tablet Take 1-2 tablets (0.25-0.5 mg total) by mouth at bedtime as needed for anxiety or sleep. 60 tablet 0 10/08/2016 at Unknown time  . bisacodyl (DULCOLAX) 5 MG EC tablet Take 10 mg by mouth daily as needed for moderate constipation.   10/08/2016 at Unknown time  . ciprofloxacin (CIPRO) 500 MG tablet Take 1 tablet (500 mg total) by mouth 2 (two) times daily. 10 tablet 0 10/09/2016 at Unknown time  . cyclobenzaprine (FLEXERIL) 10 MG tablet Take 1 tablet (10 mg total) by mouth 3 (three) times daily as needed for muscle spasms. 60 tablet 0 10/08/2016 at Unknown time  . diphenhydrAMINE (BENADRYL) 25 mg capsule Take 25 mg by mouth every 6 (six) hours as needed for sleep.   Past Month at Unknown time  . docusate sodium (COLACE) 100 MG capsule Take 300 mg by mouth daily.   10/08/2016 at Unknown time  . ferrous sulfate 325 (65 FE) MG tablet Take 325 mg by mouth 2 (two) times daily with a meal. Reported on 10/12/2015    unknown  . magnesium citrate SOLN Take 1 Bottle by mouth once.   10/08/2016 at Unknown time  . Oxycodone HCl 10 MG TABS Take 1 tablet (10 mg total) by mouth every 4 (four) hours. 120 tablet 0 10/09/2016 at Unknown time  . prochlorperazine (COMPAZINE) 10 MG tablet Take 1 tablet (10 mg total) by mouth every 6 (six) hours as needed (NAUSEA). 30 tablet 1 Past Week at Unknown time  . traMADol (ULTRAM) 50 MG tablet Take 1-2 tablets (50-100 mg total) by mouth every 6 (six) hours as needed. 60 tablet 0 10/08/2016 at Unknown time  . vitamin B-12 (CYANOCOBALAMIN) 1000 MCG tablet Take 500 mcg by mouth daily. Reported on 09/24/2015   unknown  . vitamin C (ASCORBIC ACID) 500 MG tablet Take 500 mg by mouth daily.   unknown  . dexamethasone (DECADRON) 4 MG tablet Take 2 tablets (8 mg total) by mouth daily. Start day after chemo and take for 3 days after each IV chemo (Patient not taking: Reported on 09/15/2016) 30 tablet 1 Not Taking at Unknown time  . lidocaine-prilocaine (EMLA) cream Apply 1 application  topically as needed. 30 g 2 Taking   Scheduled: . docusate sodium  100 mg Oral BID  . enoxaparin (LOVENOX) injection  40 mg Subcutaneous QHS  . oxyCODONE  10 mg Oral Q4H   Continuous: . sodium chloride 100 mL/hr at 10/10/16 1122   PJA:[id number] **OR** acetaminophen, ALPRAZolam, cyclobenzaprine, diphenhydrAMINE, ondansetron **OR** ondansetron (ZOFRAN) IV, prochlorperazine, sodium chloride flush, traMADol Anti-infectives    Start     Dose/Rate Route Frequency Ordered Stop   10/10/16 0115  ciprofloxacin (CIPRO) tablet 500 mg  Status:  Discontinued     500 mg Oral 2 times daily 10/10/16 0108 10/10/16 0120      Results for orders placed or performed during the hospital encounter of 10/09/16 (from the past 48 hour(s))  CBC with Differential     Status: Abnormal   Collection Time: 10/09/16  4:28 PM  Result Value Ref Range   WBC 6.4 4.0 - 10.5 K/uL   RBC 3.45 (L) 3.87 - 5.11 MIL/uL   Hemoglobin 9.1 (L) 12.0  - 15.0 g/dL   HCT 28.5 (L) 36.0 - 46.0 %   MCV 82.6 78.0 - 100.0 fL   MCH 26.4 26.0 - 34.0 pg   MCHC 31.9 30.0 - 36.0 g/dL   RDW 16.0 (H) 11.5 - 15.5 %   Platelets 364 150 - 400 K/uL   Neutrophils Relative % 66 %   Neutro Abs 4.2 1.7 - 7.7 K/uL   Lymphocytes Relative 14 %   Lymphs Abs 0.9 0.7 - 4.0 K/uL   Monocytes Relative 19 %   Monocytes Absolute 1.2 (H) 0.1 - 1.0 K/uL   Eosinophils Relative 1 %   Eosinophils Absolute 0.0 0.0 - 0.7 K/uL   Basophils Relative 0 %   Basophils Absolute 0.0 0.0 - 0.1 K/uL  Comprehensive metabolic panel     Status: Abnormal   Collection Time: 10/09/16  4:28 PM  Result Value Ref Range   Sodium 129 (L) 135 - 145 mmol/L   Potassium 4.3 3.5 - 5.1 mmol/L   Chloride 92 (L) 101 - 111 mmol/L   CO2 28 22 - 32 mmol/L   Glucose, Bld 112 (H) 65 - 99 mg/dL   BUN 7 6 - 20 mg/dL   Creatinine, Ser 0.60 0.44 - 1.00 mg/dL   Calcium 9.0 8.9 - 10.3 mg/dL   Total Protein 6.8 6.5 - 8.1 g/dL   Albumin 2.6 (L) 3.5 - 5.0 g/dL   AST 24 15 - 41 U/L   ALT 19 14 - 54 U/L   Alkaline Phosphatase 116 38 - 126 U/L   Total Bilirubin 0.6 0.3 - 1.2 mg/dL   GFR calc non Af Amer >60 >60 mL/min   GFR calc Af Amer >60 >60 mL/min    Comment: (NOTE) The eGFR has been calculated using the CKD EPI equation. This calculation has not been validated in all clinical situations. eGFR's persistently <60 mL/min signify possible Chronic Kidney Disease.    Anion gap 9 5 - 15  Lipase, blood     Status: Abnormal   Collection Time: 10/09/16  4:28 PM  Result Value Ref Range   Lipase <10 (L) 11 - 51 U/L  Urinalysis, Routine w reflex microscopic     Status: Abnormal   Collection Time: 10/09/16  4:29 PM  Result Value Ref Range   Color, Urine YELLOW YELLOW   APPearance HAZY (A) CLEAR   Specific Gravity, Urine 1.023 1.005 - 1.030   pH 8.0 5.0 - 8.0   Glucose, UA  NEGATIVE NEGATIVE mg/dL   Hgb urine dipstick NEGATIVE NEGATIVE   Bilirubin Urine NEGATIVE NEGATIVE   Ketones, ur 20 (A) NEGATIVE  mg/dL   Protein, ur 100 (A) NEGATIVE mg/dL   Nitrite NEGATIVE NEGATIVE   Leukocytes, UA NEGATIVE NEGATIVE   RBC / HPF 0-5 0 - 5 RBC/hpf   WBC, UA 0-5 0 - 5 WBC/hpf   Bacteria, UA NONE SEEN NONE SEEN   Squamous Epithelial / LPF 0-5 (A) NONE SEEN   Mucous PRESENT   Blood culture (routine x 2)     Status: None (Preliminary result)   Collection Time: 10/09/16  4:29 PM  Result Value Ref Range   Specimen Description BLOOD PORTA CATH    Special Requests IN PEDIATRIC BOTTLE 2ML    Culture PENDING    Report Status PENDING   I-Stat CG4 Lactic Acid, ED     Status: None   Collection Time: 10/09/16  8:50 PM  Result Value Ref Range   Lactic Acid, Venous 0.53 0.5 - 1.9 mmol/L  Basic metabolic panel     Status: Abnormal   Collection Time: 10/10/16  5:02 AM  Result Value Ref Range   Sodium 130 (L) 135 - 145 mmol/L   Potassium 4.3 3.5 - 5.1 mmol/L   Chloride 97 (L) 101 - 111 mmol/L   CO2 26 22 - 32 mmol/L   Glucose, Bld 103 (H) 65 - 99 mg/dL   BUN 5 (L) 6 - 20 mg/dL   Creatinine, Ser 0.49 0.44 - 1.00 mg/dL   Calcium 8.4 (L) 8.9 - 10.3 mg/dL   GFR calc non Af Amer >60 >60 mL/min   GFR calc Af Amer >60 >60 mL/min    Comment: (NOTE) The eGFR has been calculated using the CKD EPI equation. This calculation has not been validated in all clinical situations. eGFR's persistently <60 mL/min signify possible Chronic Kidney Disease.    Anion gap 7 5 - 15  CBC     Status: Abnormal   Collection Time: 10/10/16  5:02 AM  Result Value Ref Range   WBC 5.7 4.0 - 10.5 K/uL   RBC 3.20 (L) 3.87 - 5.11 MIL/uL   Hemoglobin 8.5 (L) 12.0 - 15.0 g/dL   HCT 26.5 (L) 36.0 - 46.0 %   MCV 82.8 78.0 - 100.0 fL   MCH 26.6 26.0 - 34.0 pg   MCHC 32.1 30.0 - 36.0 g/dL   RDW 15.9 (H) 11.5 - 15.5 %   Platelets 335 150 - 400 K/uL  Osmolality     Status: Abnormal   Collection Time: 10/10/16 11:14 AM  Result Value Ref Range   Osmolality 270 (L) 275 - 295 mOsm/kg    Comment: Performed at [facility], [place] [address] [place], [place] [address]    Dg Chest 2 View  Result Date: 10/09/2016 CLINICAL DATA:  Colon cancer, constipation EXAM: CHEST  2 VIEW COMPARISON:  Chest CT 10/05/2016 FINDINGS: Port with tip in distal SVC. No pleural fluid, pulmonary edema, or infiltrate. No pneumothorax. Gas-filled loop of colon noted. IMPRESSION: No acute cardiopulmonary process. Electronically Signed   By: Suzy Bouchard M.Tina.   On: 10/09/2016 16:13   Ct Abdomen Pelvis W Contrast  Result Date: 10/09/2016 CLINICAL DATA:  Fever for 5 days. No bowel movement for 10 days. History of colon cancer. EXAM: CT ABDOMEN AND PELVIS WITH CONTRAST TECHNIQUE: Multidetector CT imaging of the abdomen and pelvis was performed using the standard protocol following bolus administration of intravenous contrast. CONTRAST:  80 ml ISOVUE-300 IOPAMIDOL (ISOVUE-300) INJECTION 61% COMPARISON:  10/05/2016 FINDINGS: Lower chest: Mild dependent changes in the lung bases. Nodule in the right lung base measuring 4 mm diameter. No change since previous study. Additional nodules seen on prior study are not included within the field of view. Coronary artery calcifications. Hepatobiliary: Scalloping of the liver margin consistent with extrinsic compression from diffuse peritoneal metastases. No discrete focal liver lesions identified. Gallbladder and bile ducts are unremarkable. Portal veins are patent. Pancreas: Pancreas is atrophic but appears unremarkable. No inflammatory changes seen. Spleen: Scalloping of the spleen margin consistent with extrinsic compression from diffuse peritoneal metastasis. No discrete focal intrasplenic lesions. Adrenals/Urinary Tract: Right adrenal gland calcification likely representing area of old infection or hemorrhage. No discrete adrenal gland nodules. Kidneys are symmetrical with symmetrical and homogeneous nephrograms. No hydronephrosis or hydroureter. Bladder is decompressed. Stomach/Bowel: Postoperative changes with gastric  bypass. There appears to have been a partial right hemicolectomy with an ileal colonic anastomosis. Contrast material is demonstrated in the colon suggesting no evidence of bowel obstruction. Bowel are mostly decompressed, some of the bowel loops demonstrate signal areas of wall thickening and edema. The remaining colon is moderately distended with air-fluid levels, progressing since previous study. In the sigmoid region, there are several focal areas of stricturing and wall thickening. This may be causing a degree of partial obstruction. Etiology of the areas of narrowing/ wall thickening in the small bowel and colon probably due to serosal implants from the peritoneal carcinomatosis. Superimposed inflammatory process not entirely excluded. Vascular/Lymphatic: Scattered calcification in the aorta. No aneurysm. Mild prominence of retroperitoneal lymph nodes nonspecific but unchanged. Reproductive: Status post hysterectomy. No adnexal masses. Other: Diffuse peritoneal carcinomatosis with low-attenuation mass is demonstrated throughout the peritoneal. There is un ventral abdominal wall hernia with herniation of peritoneal tumor. Loculated fluid collections may be present consistent with some malignant ascites. Appearances are similar to previous study. Tumor deposits are demonstrated in the subcutaneous fat and musculature of the right flank anterior to the iliac spine. Musculoskeletal: No acute or significant osseous findings. IMPRESSION: Prominent diffuse peritoneal carcinomatosis with low-attenuation lesions seen throughout the peritoneal cavity and causing scalloping of the margins of the liver and spleen. Some loculated fluid collections may be present, likely due to malignant ascites. Appearances are similar to previous study. There appears to be partial colectomy with remaining colon distended and filled with contrast material and gas. Contrast material in the colon suggest no evidence of complete obstruction.  Segmental areas of wall thickening and edema demonstrated in the small bowel and in the sigmoid colon, likely resulting from serosal implants of carcinomatosis. Superimposed inflammatory process not entirely excluded. Electronically Signed   By: Lucienne Capers M.Tina.   On: 10/09/2016 23:14    Review of Systems  Constitutional: Positive for fever (about 2 weeks ago), malaise/fatigue and weight loss.  Cardiovascular: Negative for chest pain and orthopnea.  Gastrointestinal: Positive for abdominal pain, nausea and vomiting.  All other systems reviewed and are negative.  Blood pressure 124/86, pulse (!) 117, temperature 98.7 F (37.1 C), temperature source Oral, resp. rate 18, height 5' (1.524 m), weight 56 kg (123 lb 7.3 oz), SpO2 98 %. Physical Exam  Constitutional: She is oriented to person, place, and time. She appears cachectic. She has a sickly appearance. No distress.  HENT:  Head: Normocephalic and atraumatic.  Right Ear: External ear normal.  Left Ear: External ear normal.  Nose: Nose normal.  Eyes: Conjunctivae are normal. No scleral icterus.  Neck: Normal  range of motion. Neck supple. No tracheal deviation present.  Cardiovascular: Normal rate, normal heart sounds and intact distal pulses.   Respiratory: Breath sounds normal. No respiratory distress. She has no wheezes.  GI: Soft. She exhibits distension. There is no rebound and no guarding.  Protuberant abdomen; soft, full, mild TTP throughout. No rebound/guarding  Musculoskeletal: She exhibits no edema or tenderness.  Neurological: She is alert and oriented to person, place, and time.  Skin: Skin is warm and dry.  Psychiatric: She has a normal mood and affect. Judgment and thought content normal.    Assessment/Plan: Stage IV colon cancer with carcinomatosis Nausea/constipation Moderate protein calorie malnutrition Anemia of chronic disease  I reviewed imaging, labs, oncology note  Pt has extensive intra-abd  peritoneal/bowel/liver/spleen implants.some of the implants appear to be involving SB and distal colon   rec bowel rest today, ivf Contrast did reach colon on ct which suggests her sb is not completely obstructed Repeat xray in am and follow contrast [facility] needs to see Will follow. Not sure if there is a surgical option here.   Leighton Ruff. Redmond Pulling, MD, FACS General, Bariatric, & Minimally Invasive Surgery [facility], [place]

## 2016-10-10 NOTE — Progress Notes (Signed)
PROGRESS NOTE  [name] OFB:[id number] DOB: 22-May-1966 DOA: 10/09/2016 PCP: Reginia Naas, MD  Brief History:  51 year old female with a history of stage IIIB colon cancer with mets to lung and liver and peritoneal carcinomatosis currently on Folfiri and Avastin,  anxiety/depression, hyponatremia presenting with worsening abdominal pain. The patient states that she has not had a bowel movement in 10 days. She tried over-the-counter laxatives including magnesium citrate x 5 without results. She has had decreased oral intake and increasing fatigue over the past week. The patient was seen at the [facility] cancer center on 10/04/2016 with worsening abdominal pain. At that time, the patient's oxycodone was increased to 10 mg every 4 hours when necessary pain and CT of the abdomen and pelvis was ordered.   Regarding her oncologic history, the patient was diagnosed with colon cancer and underwent right hemicolectomy on 08/03/2015. She was initiated on chemotherapy on 09/18/2015. PET scan imaging on 05/20/2016 revealed hypermetabolic peritoneal carcinomatosis with 5 scattered pulmonary nodules with possible hypermetabolic mass in the right ventral pelvic wall.  Assessment/Plan: Intractable abdominal pain/uncontrolled pain -Secondary to peritoneal carcinomatosis causing extrinsic compression -10/09/2016 CT abdomen and pelvis--progression of air-fluid levels with moderately distended colon. Several local areas of stricturing and wall thickening in the sigmoid colon likely representing a degree of partial obstruction. Small bowel loops with signal areas of wall thickening and edema. -Consult general surgery -consult pt's medical oncologist  Hyponatremia -Likely volume depletion although cannot rule out a degree of SIADH -Serum osmolarity -Urine osmolarity -Urine sodium -Urine creatinine -Continue IV fluids  Peritoneal carcinomatosis -05/30/2016 peritoneal biopsy shows adenocarcinoma  with likely colon primary -last chemo 09/29/16 -consulted Dr. Burr Medico  Iron deficiency anemia -managed by Dr. Burr Medico  Failure to thrive  -consult nutrition  Fibromyalgia and depression -Continue home dose alprazolam    Disposition Plan:   Not stable for d/c;  Pending input from consultants Family Communication:  No Family at bedside--Total time spent 35 minutes.  Greater than 50% spent face to face counseling and coordinating care.   Consultants:  Dr. Burr Medico; General surgery  Code Status:  FULL  DVT Prophylaxis:   [place] Lovenox   Procedures: As Listed in Progress Note Above  Antibiotics: None    Subjective: Patient denies abdominal pain which is about the same as last several days. She has not had a bowel movement since 09/28/2016. She is passing flatus. Denies any nausea, vomiting, chest pain, shortness breath, cough, hemoptysis. No fevers or chills.  Objective: Vitals:   10/09/16 2329 10/10/16 0014 10/10/16 0153 10/10/16 0451  BP: 123/89 112/77 128/89 124/86  Pulse: (!) 111 (!) 112 (!) 110 (!) 117  Resp: 18 18 16 18   Temp: 98.6 F (37 C)  98.9 F (37.2 C) 98.7 F (37.1 C)  TempSrc: Oral  Oral Oral  SpO2: 96% 98% 98% 98%  Weight:   56 kg (123 lb 7.3 oz)   Height:   5' (1.524 m)     Intake/Output Summary (Last 24 hours) at 10/10/16 0957 Last data filed at 10/10/16 0859  Gross per 24 hour  Intake              600 ml  Output                0 ml  Net              600 ml   Weight change:  Exam:   General:  Pt is alert,  follows commands appropriately, not in acute distress  HEENT: No icterus, No thrush, No neck mass, [place]/AT  Cardiovascular: RRR, S1/S2, no rubs, no gallops  Respiratory: Bibasilar crackles. No wheeze. Good air movement.  Abdomen: Soft/+BS, non tender, non distended, no guarding  Extremities: 1 + LE edema, No lymphangitis, No petechiae, No rashes, no synovitis   Data Reviewed: I have personally reviewed following labs and imaging  studies Basic Metabolic Panel:  Recent Labs Lab 10/04/16 1327 10/09/16 1628 10/10/16 0502  NA 132* 129* 130*  K 4.5 4.3 4.3  CL  --  92* 97*  CO2 25 28 26   GLUCOSE 103 112* 103*  BUN 8.1 7 5*  CREATININE 0.7 0.60 0.49  CALCIUM 10.3 9.0 8.4*   Liver Function Tests:  Recent Labs Lab 10/04/16 1327 10/09/16 1628  AST 11 24  ALT 9 19  ALKPHOS 128 116  BILITOT 0.42 0.6  PROT 7.5 6.8  ALBUMIN 3.2* 2.6*    Recent Labs Lab 10/09/16 1628  LIPASE <10*   No results for input(s): AMMONIA in the last 168 hours. Coagulation Profile: No results for input(s): INR, PROTIME in the last 168 hours. CBC:  Recent Labs Lab 10/04/16 1327 10/09/16 1628 10/10/16 0502  WBC 5.3 6.4 5.7  NEUTROABS 3.6 4.2  --   HGB 11.1* 9.1* 8.5*  HCT 33.8* 28.5* 26.5*  MCV 83.7 82.6 82.8  PLT 221 364 335   Cardiac Enzymes: No results for input(s): CKTOTAL, CKMB, CKMBINDEX, TROPONINI in the last 168 hours. BNP: Invalid input(s): POCBNP CBG: No results for input(s): GLUCAP in the last 168 hours. HbA1C: No results for input(s): HGBA1C in the last 72 hours. Urine analysis:    Component Value Date/Time   COLORURINE YELLOW 10/09/2016 1629   APPEARANCEUR HAZY (A) 10/09/2016 1629   LABSPEC 1.023 10/09/2016 1629   LABSPEC 1.010 10/04/2016 1519   PHURINE 8.0 10/09/2016 1629   GLUCOSEU NEGATIVE 10/09/2016 1629   GLUCOSEU Negative 10/04/2016 1519   HGBUR NEGATIVE 10/09/2016 1629   BILIRUBINUR NEGATIVE 10/09/2016 1629   BILIRUBINUR Negative 10/04/2016 1519   KETONESUR 20 (A) 10/09/2016 1629   PROTEINUR 100 (A) 10/09/2016 1629   UROBILINOGEN 0.2 10/04/2016 1519   NITRITE NEGATIVE 10/09/2016 1629   LEUKOCYTESUR NEGATIVE 10/09/2016 1629   LEUKOCYTESUR Negative 10/04/2016 1519   Sepsis Labs: @LABRCNTIP (procalcitonin:4,lacticidven:4) ) Recent Results (from the past 240 hour(s))  Urine Culture     Status: Abnormal   Collection Time: 10/04/16  3:19 PM  Result Value Ref Range Status   Urine  Culture, Routine Final report (A)  Final   Urine Culture result 1 Comment (A)  Final    Comment: Beta hemolytic Streptococcus, group B 10,000-25,000 colony forming units per mL Penicillin and ampicillin are drugs of choice for treatment of beta-hemolytic streptococcal infections. Susceptibility testing of penicillins and other beta-lactam agents approved by the FDA for treatment of beta-hemolytic streptococcal infections need not be performed routinely because nonsusceptible isolates are extremely rare in any beta-hemolytic streptococcus and have not been reported for Streptococcus pyogenes (group A). (CLSI 2011)   Blood culture (routine x 2)     Status: None (Preliminary result)   Collection Time: 10/09/16  4:29 PM  Result Value Ref Range Status   Specimen Description BLOOD PORTA CATH  Final   Special Requests IN PEDIATRIC BOTTLE 2ML  Final   Culture PENDING  Incomplete   Report Status PENDING  Incomplete     Scheduled Meds: . docusate sodium  100 mg Oral BID  . enoxaparin (  LOVENOX) injection  40 mg Subcutaneous QHS  . oxyCODONE  10 mg Oral Q4H   Continuous Infusions: . sodium chloride 100 mL/hr at 10/10/16 0143    Procedures/Studies: Dg Chest 2 View  Result Date: 10/09/2016 CLINICAL DATA:  Colon cancer, constipation EXAM: CHEST  2 VIEW COMPARISON:  Chest CT 10/05/2016 FINDINGS: Port with tip in distal SVC. No pleural fluid, pulmonary edema, or infiltrate. No pneumothorax. Gas-filled loop of colon noted. IMPRESSION: No acute cardiopulmonary process. Electronically Signed   By: Suzy Bouchard M.D.   On: 10/09/2016 16:13   Dg Abd 1 View  Result Date: 10/04/2016 CLINICAL DATA:  Abdominal pain x3 days, history of colon cancer EXAM: ABDOMEN - 1 VIEW COMPARISON:  CT abdomen/pelvis dated 08/17/2016 FINDINGS: Nonspecific but nonobstructive bowel gas pattern. Suture line in the left upper abdomen with surgical clip, likely related to gastric bypass. Suture line in the right lower  abdomen. Surgical clip in the left lower abdomen. Visualized osseous structures are within normal limits. IMPRESSION: Unremarkable abdominal radiograph. Electronically Signed   By: Julian Hy M.D.   On: 10/04/2016 13:16   Ct Chest W Contrast  Result Date: 10/05/2016 CLINICAL DATA:  Colon cancer EXAM: CT CHEST, ABDOMEN, AND PELVIS WITH CONTRAST TECHNIQUE: Multidetector CT imaging of the chest, abdomen and pelvis was performed following the standard protocol during bolus administration of intravenous contrast. CONTRAST:  35mL ISOVUE-300 IOPAMIDOL (ISOVUE-300) INJECTION 61%, 166mL ISOVUE-300 IOPAMIDOL (ISOVUE-300) INJECTION 61% COMPARISON:  08/17/2016 FINDINGS: CT CHEST FINDINGS Cardiovascular: The heart size is normal. No pericardial effusion. Aortic atherosclerosis is noted. Mediastinum/Nodes: The trachea appears patent and is midline. Normal appearance of the esophagus. There is no mediastinal or hilar adenopathy. No axillary or supraclavicular adenopathy. Lungs/Pleura: No pleural effusion. There is no airspace consolidation or atelectasis identified. Index nodule within the left upper lobe is stable measuring 5 mm, image 59 of series 6. There is a index nodule within the anterior right upper lobe which measures 4 mm, image 66 of series 6. Previously 3 mm. 3 mm subpleural nodule in the right lower lobe is stable from previous exam. Musculoskeletal: There are no aggressive lytic or sclerotic bone lesions. CT ABDOMEN PELVIS FINDINGS Hepatobiliary: Scalloping of the liver secondary to peritoneal carcinomatosis. No intraparenchymal liver lesion identified. The gallbladder appears normal. No biliary dilatation. Pancreas: Unremarkable. No pancreatic ductal dilatation or surrounding inflammatory changes. Spleen: Scalloping of the splenic margin secondary to peritoneal carcinomatosis. The appearance is similar to previous exam. Adrenals/Urinary Tract: Normal appearance of the adrenal glands. No kidney mass or  hydronephrosis identified. Urinary bladder appears normal. Stomach/Bowel: Previous gastrojejunal bypass. No pathologic dilatation of the small or large bowel loops. Vascular/Lymphatic: No significant vascular findings are present. No enlarged abdominal or pelvic lymph nodes. Reproductive:  Status post hysterectomy Other: Extensive peritoneal carcinomatosis is identified. Index lesion which extends through the ventral abdominal wall hernia measures 4.5 by 2.8 cm, image 64 of series 2. Unchanged from previous exam. Index lesion along the undersurface of the ventral abdominal wall (slightly left of midline measures 3.2 by 7.0 cm, image 78 of series 2. This is increased from 3.2 x 5.0 cm previously. Right lower quadrant index lesion which is involving the ileo psoas muscle measures 3.9 x 4.0 cm, image 83 of series 2. Previously 4.5 x 4.7 cm. Musculoskeletal: No aggressive lytic or sclerotic bone lesions. IMPRESSION: 1. No significant interval change in extensive peritoneal carcinomatosis with associated scalloping of the liver and spleen. 2. No significant change in multiple pulmonary nodules. Electronically Signed  By: Kerby Moors M.D.   On: 10/05/2016 11:32   Ct Abdomen Pelvis W Contrast  Result Date: 10/09/2016 CLINICAL DATA:  Fever for 5 days. No bowel movement for 10 days. History of colon cancer. EXAM: CT ABDOMEN AND PELVIS WITH CONTRAST TECHNIQUE: Multidetector CT imaging of the abdomen and pelvis was performed using the standard protocol following bolus administration of intravenous contrast. CONTRAST:  80 ml ISOVUE-300 IOPAMIDOL (ISOVUE-300) INJECTION 61% COMPARISON:  10/05/2016 FINDINGS: Lower chest: Mild dependent changes in the lung bases. Nodule in the right lung base measuring 4 mm diameter. No change since previous study. Additional nodules seen on prior study are not included within the field of view. Coronary artery calcifications. Hepatobiliary: Scalloping of the liver margin consistent with  extrinsic compression from diffuse peritoneal metastases. No discrete focal liver lesions identified. Gallbladder and bile ducts are unremarkable. Portal veins are patent. Pancreas: Pancreas is atrophic but appears unremarkable. No inflammatory changes seen. Spleen: Scalloping of the spleen margin consistent with extrinsic compression from diffuse peritoneal metastasis. No discrete focal intrasplenic lesions. Adrenals/Urinary Tract: Right adrenal gland calcification likely representing area of old infection or hemorrhage. No discrete adrenal gland nodules. Kidneys are symmetrical with symmetrical and homogeneous nephrograms. No hydronephrosis or hydroureter. Bladder is decompressed. Stomach/Bowel: Postoperative changes with gastric bypass. There appears to have been a partial right hemicolectomy with an ileal colonic anastomosis. Contrast material is demonstrated in the colon suggesting no evidence of bowel obstruction. Bowel are mostly decompressed, some of the bowel loops demonstrate signal areas of wall thickening and edema. The remaining colon is moderately distended with air-fluid levels, progressing since previous study. In the sigmoid region, there are several focal areas of stricturing and wall thickening. This may be causing a degree of partial obstruction. Etiology of the areas of narrowing/ wall thickening in the small bowel and colon probably due to serosal implants from the peritoneal carcinomatosis. Superimposed inflammatory process not entirely excluded. Vascular/Lymphatic: Scattered calcification in the aorta. No aneurysm. Mild prominence of retroperitoneal lymph nodes nonspecific but unchanged. Reproductive: Status post hysterectomy. No adnexal masses. Other: Diffuse peritoneal carcinomatosis with low-attenuation mass is demonstrated throughout the peritoneal. There is un ventral abdominal wall hernia with herniation of peritoneal tumor. Loculated fluid collections may be present consistent with  some malignant ascites. Appearances are similar to previous study. Tumor deposits are demonstrated in the subcutaneous fat and musculature of the right flank anterior to the iliac spine. Musculoskeletal: No acute or significant osseous findings. IMPRESSION: Prominent diffuse peritoneal carcinomatosis with low-attenuation lesions seen throughout the peritoneal cavity and causing scalloping of the margins of the liver and spleen. Some loculated fluid collections may be present, likely due to malignant ascites. Appearances are similar to previous study. There appears to be partial colectomy with remaining colon distended and filled with contrast material and gas. Contrast material in the colon suggest no evidence of complete obstruction. Segmental areas of wall thickening and edema demonstrated in the small bowel and in the sigmoid colon, likely resulting from serosal implants of carcinomatosis. Superimposed inflammatory process not entirely excluded. Electronically Signed   By: Lucienne Capers M.D.   On: 10/09/2016 23:14   Ct Abdomen Pelvis W Contrast  Result Date: 10/05/2016 CLINICAL DATA:  Colon cancer EXAM: CT CHEST, ABDOMEN, AND PELVIS WITH CONTRAST TECHNIQUE: Multidetector CT imaging of the chest, abdomen and pelvis was performed following the standard protocol during bolus administration of intravenous contrast. CONTRAST:  40mL ISOVUE-300 IOPAMIDOL (ISOVUE-300) INJECTION 61%, 145mL ISOVUE-300 IOPAMIDOL (ISOVUE-300) INJECTION 61% COMPARISON:  08/17/2016 FINDINGS:  CT CHEST FINDINGS Cardiovascular: The heart size is normal. No pericardial effusion. Aortic atherosclerosis is noted. Mediastinum/Nodes: The trachea appears patent and is midline. Normal appearance of the esophagus. There is no mediastinal or hilar adenopathy. No axillary or supraclavicular adenopathy. Lungs/Pleura: No pleural effusion. There is no airspace consolidation or atelectasis identified. Index nodule within the left upper lobe is stable  measuring 5 mm, image 59 of series 6. There is a index nodule within the anterior right upper lobe which measures 4 mm, image 66 of series 6. Previously 3 mm. 3 mm subpleural nodule in the right lower lobe is stable from previous exam. Musculoskeletal: There are no aggressive lytic or sclerotic bone lesions. CT ABDOMEN PELVIS FINDINGS Hepatobiliary: Scalloping of the liver secondary to peritoneal carcinomatosis. No intraparenchymal liver lesion identified. The gallbladder appears normal. No biliary dilatation. Pancreas: Unremarkable. No pancreatic ductal dilatation or surrounding inflammatory changes. Spleen: Scalloping of the splenic margin secondary to peritoneal carcinomatosis. The appearance is similar to previous exam. Adrenals/Urinary Tract: Normal appearance of the adrenal glands. No kidney mass or hydronephrosis identified. Urinary bladder appears normal. Stomach/Bowel: Previous gastrojejunal bypass. No pathologic dilatation of the small or large bowel loops. Vascular/Lymphatic: No significant vascular findings are present. No enlarged abdominal or pelvic lymph nodes. Reproductive:  Status post hysterectomy Other: Extensive peritoneal carcinomatosis is identified. Index lesion which extends through the ventral abdominal wall hernia measures 4.5 by 2.8 cm, image 64 of series 2. Unchanged from previous exam. Index lesion along the undersurface of the ventral abdominal wall (slightly left of midline measures 3.2 by 7.0 cm, image 78 of series 2. This is increased from 3.2 x 5.0 cm previously. Right lower quadrant index lesion which is involving the ileo psoas muscle measures 3.9 x 4.0 cm, image 83 of series 2. Previously 4.5 x 4.7 cm. Musculoskeletal: No aggressive lytic or sclerotic bone lesions. IMPRESSION: 1. No significant interval change in extensive peritoneal carcinomatosis with associated scalloping of the liver and spleen. 2. No significant change in multiple pulmonary nodules. Electronically Signed    By: Kerby Moors M.D.   On: 10/05/2016 11:32    Nikkia Devoss, DO  [facility] Pager [phone]  If 7PM-7AM, please contact night-coverage [email or web address] Password [email or web address] 10/10/2016, 9:57 AM   LOS: 0 days

## 2016-10-10 NOTE — Progress Notes (Signed)
Pt admitted from ED to 1338. Oriented to room and reviewed plan of care. Pt has requested to hold off on soap suds enema for now, as she is in too much pain and is extremely tired. She is willing to do it later this morning. Continue to monitor. Hortencia Conradi RN .

## 2016-10-11 ENCOUNTER — Inpatient Hospital Stay (HOSPITAL_COMMUNITY): Payer: 59

## 2016-10-11 DIAGNOSIS — R109 Unspecified abdominal pain: Secondary | ICD-10-CM

## 2016-10-11 LAB — MAGNESIUM: MAGNESIUM: 2.4 mg/dL (ref 1.7–2.4)

## 2016-10-11 LAB — BASIC METABOLIC PANEL
ANION GAP: 9 (ref 5–15)
BUN: <5 mg/dL — ABNORMAL LOW (ref 6–20)
CALCIUM: 8.5 mg/dL — AB (ref 8.9–10.3)
CO2: 25 mmol/L (ref 22–32)
Chloride: 99 mmol/L — ABNORMAL LOW (ref 101–111)
Creatinine, Ser: 0.55 mg/dL (ref 0.44–1.00)
GFR calc Af Amer: >60 mL/min (ref >60)
GLUCOSE: 99 mg/dL (ref 65–99)
POTASSIUM: 4.1 mmol/L (ref 3.5–5.1)
Sodium: 133 mmol/L — ABNORMAL LOW (ref 135–145)

## 2016-10-11 LAB — CBC
HCT: 27 % — ABNORMAL LOW (ref 36.0–46.0)
HEMOGLOBIN: 8.7 g/dL — AB (ref 12.0–15.0)
MCH: 27.2 pg (ref 26.0–34.0)
MCHC: 32.2 g/dL (ref 30.0–36.0)
MCV: 84.4 fL (ref 78.0–100.0)
Platelets: 372 10*3/uL (ref 150–400)
RBC: 3.2 MIL/uL — AB (ref 3.87–5.11)
RDW: 15.9 % — ABNORMAL HIGH (ref 11.5–15.5)
WBC: 6.3 10*3/uL (ref 4.0–10.5)

## 2016-10-11 LAB — URINE CULTURE: Culture: <10000 — AB

## 2016-10-11 NOTE — Progress Notes (Signed)
PROGRESS NOTE  [name] MIW:[id number] DOB: 1966-01-15 DOA: 10/09/2016 PCP: Reginia Naas, MD   Brief History:  51 year old female with a history of stage IIIB colon cancer with mets to lung and liver and peritoneal carcinomatosis currently on Folfiri and Avastin,  anxiety/depression, hyponatremia presenting with worsening abdominal pain. The patient states that she has not had a bowel movement in 10 days. She tried over-the-counter laxatives including magnesium citrate x 5 without results. She has had decreased oral intake and increasing fatigue over the past week. The patient was seen at the [facility] cancer center on 10/04/2016 with worsening abdominal pain. At that time, the patient's oxycodone was increased to 10 mg every 4 hours when necessary pain and CT of the abdomen and pelvis was ordered.   Regarding her oncologic history, the patient was diagnosed with colon cancer and underwent right hemicolectomy on 08/03/2015. She was initiated on chemotherapy on 09/18/2015. PET scan imaging on 05/20/2016 revealed hypermetabolic peritoneal carcinomatosis with 5 scattered pulmonary nodules with possible hypermetabolic mass in the right ventral pelvic wall.  Assessment/Plan: Intractable abdominal pain/uncontrolled pain -Secondary to peritoneal carcinomatosis causing extrinsic compression -10/09/2016 CT abdomen and pelvis--progression of air-fluid levels with moderately distended colon. Several local areas of stricturing and wall thickening in the sigmoid colon likely representing a degree of partial obstruction. Small bowel loops with signal areas of wall thickening and edema. 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 Burr Medico followup  Iron deficiency anemia -managed by Dr. Burr Medico  Failure to thrive  -consult nutrition  Fibromyalgia and depression -Continue home dose alprazolam    Disposition Plan:   Not stable for d/c;  Pending input from consultants Family Communication:  No Family at bedside--Total time spent 35 minutes.  Greater than 50% spent face to face counseling and coordinating care.    Consultants:  Dr. Burr Medico; General surgery; [facility]  Code Status:  FULL  DVT Prophylaxis:   [place] Lovenox   Procedures: As Listed in Progress Note Above  Antibiotics: None    Subjective: Patient states that abdominal pain is much better. Only small bowel movement with fleets enema. Passing flatus. Denies any fevers, chills, chest pain, short of breath, nausea, vomiting, coughing, hemoptysis.  Objective: Vitals:   10/10/16 0451 10/10/16 1506 10/10/16 2231 10/11/16 0505  BP: 124/86 (!) 130/93 127/86 130/84  Pulse: (!) 117 (!) 110 (!) 116 (!) 113  Resp: 18 16 16 16   Temp: 98.7 F (37.1 C) 98.3 F (36.8 C) 99.4 F (37.4 C) 98.7 F (37.1 C)  TempSrc: Oral Oral Oral Oral  SpO2: 98% 100% 98% 99%  Weight:      Height:        Intake/Output Summary (Last 24 hours) at 10/11/16 1237 Last data filed at 10/11/16 0630  Gross per 24 hour  Intake             1200 ml  Output              600 ml  Net              600 ml  Weight change:  Exam:   General:  Pt is alert, follows commands appropriately, not in acute distress  HEENT: No icterus, No thrush, No neck mass, [place]/AT  Cardiovascular: RRR, S1/S2, no rubs, no gallops  Respiratory: Bibasilar crackles. No wheezing. Good air movement.  Abdomen: Soft/+BS, non tender, non distended,  no guarding  Extremities: No edema, No lymphangitis, No petechiae, No rashes, no synovitis   Data Reviewed: I have personally reviewed following labs and imaging studies Basic Metabolic Panel:  Recent Labs Lab 10/04/16 1327 10/09/16 1628 10/10/16 0502 10/11/16 0525  NA 132* 129* 130* 133*  K 4.5 4.3 4.3 4.1  CL  --  92* 97* 99*  CO2 25 28 26 25   GLUCOSE 103 112* 103* 99  BUN 8.1 7 5* <5*  CREATININE 0.7 0.60 0.49 0.55  CALCIUM 10.3 9.0 8.4* 8.5*  MG  --   --   --  2.4   Liver Function Tests:  Recent Labs Lab 10/04/16 1327 10/09/16 1628  AST 11 24  ALT 9 19  ALKPHOS 128 116  BILITOT 0.42 0.6  PROT 7.5 6.8  ALBUMIN 3.2* 2.6*    Recent Labs Lab 10/09/16 1628  LIPASE <10*   No results for input(s): AMMONIA in the last 168 hours. Coagulation Profile: No results for input(s): INR, PROTIME in the last 168 hours. CBC:  Recent Labs Lab 10/04/16 1327 10/09/16 1628 10/10/16 0502 10/11/16 0525  WBC 5.3 6.4 5.7 6.3  NEUTROABS 3.6 4.2  --   --   HGB 11.1* 9.1* 8.5* 8.7*  HCT 33.8* 28.5* 26.5* 27.0*  MCV 83.7 82.6 82.8 84.4  PLT 221 364 335 372   Cardiac Enzymes: No results for input(s): CKTOTAL, CKMB, CKMBINDEX, TROPONINI in the last 168 hours. BNP: Invalid input(s): POCBNP CBG: No results for input(s): GLUCAP in the last 168 hours. HbA1C: No results for input(s): HGBA1C in the last 72 hours. Urine analysis:    Component Value Date/Time   COLORURINE YELLOW 10/09/2016 1629   APPEARANCEUR HAZY (A) 10/09/2016 1629   LABSPEC 1.023 10/09/2016 1629   LABSPEC 1.010 10/04/2016 1519   PHURINE 8.0 10/09/2016 1629   GLUCOSEU NEGATIVE 10/09/2016 1629   GLUCOSEU Negative 10/04/2016 1519   HGBUR NEGATIVE 10/09/2016 1629   BILIRUBINUR NEGATIVE 10/09/2016 1629   BILIRUBINUR Negative 10/04/2016 1519   KETONESUR 20 (A) 10/09/2016 1629   PROTEINUR 100 (A) 10/09/2016 1629   UROBILINOGEN 0.2 10/04/2016 1519   NITRITE NEGATIVE 10/09/2016 1629   LEUKOCYTESUR NEGATIVE  10/09/2016 1629   LEUKOCYTESUR Negative 10/04/2016 1519   Sepsis Labs: @LABRCNTIP (procalcitonin:4,lacticidven:4) ) Recent Results (from the past 240 hour(s))  Urine Culture     Status: Abnormal   Collection Time: 10/04/16  3:19 PM  Result Value Ref Range Status   Urine Culture, Routine Final report (A)  Final   Urine Culture result 1 Comment (A)  Final    Comment: Beta hemolytic Streptococcus, group B 10,000-25,000 colony forming units per mL Penicillin and ampicillin are drugs of choice for treatment of beta-hemolytic streptococcal infections. Susceptibility testing of penicillins and other beta-lactam agents approved by the FDA for treatment of beta-hemolytic streptococcal infections need not be performed routinely because nonsusceptible isolates are extremely rare in any beta-hemolytic streptococcus and have not been reported for Streptococcus pyogenes (group A). (CLSI 2011)   Urine culture     Status: Abnormal   Collection Time: 10/09/16  4:29 PM  Result Value Ref Range Status   Specimen Description URINE, RANDOM  Final   Special Requests NONE  Final   Culture (A)  Final    <10,000 COLONIES/mL INSIGNIFICANT GROWTH Performed at [facility], [place] [address], [place], [place] [address]    Report Status 10/11/2016 FINAL  Final  Blood culture (routine x 2)     Status: None (Preliminary result)   Collection Time: 10/09/16  4:29 PM  Result Value Ref Range Status   Specimen Description BLOOD RIGHT ANTECUBITAL  Final   Special Requests BOTTLES DRAWN AEROBIC AND ANAEROBIC 5 CC  Final   Culture   Final    NO GROWTH < 24 HOURS Performed at [facility], [place] [address] [place], [place] [address]    Report Status PENDING  Incomplete  Blood culture (routine x 2)     Status: None (Preliminary result)   Collection Time: 10/09/16  4:29 PM  Result Value Ref Range Status   Specimen Description BLOOD PORTA CATH  Final   Special Requests IN PEDIATRIC BOTTLE 2ML  Final    Culture   Final    NO GROWTH < 24 HOURS Performed at [facility], [place] [address] [place], [place] [address]    Report Status PENDING  Incomplete     Scheduled Meds: . docusate sodium  100 mg Oral BID  . enoxaparin (LOVENOX) injection  40 mg Subcutaneous QHS  . oxyCODONE  10 mg Oral Q4H   Continuous Infusions: . sodium chloride 1,000 mL (10/11/16 0519)    Procedures/Studies: Dg Chest 2 View  Result Date: 10/09/2016 CLINICAL DATA:  Colon cancer, constipation EXAM: CHEST  2 VIEW COMPARISON:  Chest CT 10/05/2016 FINDINGS: Port with tip in distal SVC. No pleural fluid, pulmonary edema, or infiltrate. No pneumothorax. Gas-filled loop of colon noted. IMPRESSION: No acute cardiopulmonary process. Electronically Signed   By: Suzy Bouchard M.D.   On: 10/09/2016 16:13   Dg Abd 1 View  Result Date: 10/04/2016 CLINICAL DATA:  Abdominal pain x3 days, history of colon cancer EXAM: ABDOMEN - 1 VIEW COMPARISON:  CT abdomen/pelvis dated 08/17/2016 FINDINGS: Nonspecific but nonobstructive bowel gas pattern. Suture line in the left upper abdomen with surgical clip, likely related to gastric bypass. Suture line in the right lower abdomen. Surgical clip in the left lower abdomen. Visualized osseous structures are within normal limits. IMPRESSION: Unremarkable abdominal radiograph. Electronically Signed   By: Julian Hy M.D.   On: 10/04/2016 13:16   Ct Chest W Contrast  Result Date: 10/05/2016 CLINICAL DATA:  Colon cancer EXAM: CT CHEST, ABDOMEN, AND PELVIS WITH CONTRAST TECHNIQUE: Multidetector CT imaging of the chest, abdomen and pelvis was performed following the standard protocol during bolus administration of intravenous contrast. CONTRAST:  64mL ISOVUE-300 IOPAMIDOL (ISOVUE-300) INJECTION 61%, 173mL ISOVUE-300 IOPAMIDOL (ISOVUE-300) INJECTION 61% COMPARISON:  08/17/2016 FINDINGS: CT CHEST FINDINGS Cardiovascular: The heart size is normal. No pericardial effusion. Aortic atherosclerosis is  noted. Mediastinum/Nodes: The trachea appears patent and is midline. Normal appearance of the esophagus. There is no mediastinal or hilar adenopathy. No axillary or supraclavicular adenopathy. Lungs/Pleura: No pleural effusion. There is no airspace consolidation or atelectasis identified. Index nodule within the left upper lobe is stable measuring 5 mm, image 59 of series 6. There is a index nodule within the anterior right upper lobe which measures 4 mm, image 66 of series 6. Previously 3 mm. 3 mm subpleural nodule in the right lower lobe is stable from previous exam. Musculoskeletal: There are no aggressive lytic or sclerotic bone lesions. CT ABDOMEN PELVIS FINDINGS Hepatobiliary: Scalloping of the liver secondary to  peritoneal carcinomatosis. No intraparenchymal liver lesion identified. The gallbladder appears normal. No biliary dilatation. Pancreas: Unremarkable. No pancreatic ductal dilatation or surrounding inflammatory changes. Spleen: Scalloping of the splenic margin secondary to peritoneal carcinomatosis. The appearance is similar to previous exam. Adrenals/Urinary Tract: Normal appearance of the adrenal glands. No kidney mass or hydronephrosis identified. Urinary bladder appears normal. Stomach/Bowel: Previous gastrojejunal bypass. No pathologic dilatation of the small or large bowel loops. Vascular/Lymphatic: No significant vascular findings are present. No enlarged abdominal or pelvic lymph nodes. Reproductive:  Status post hysterectomy Other: Extensive peritoneal carcinomatosis is identified. Index lesion which extends through the ventral abdominal wall hernia measures 4.5 by 2.8 cm, image 64 of series 2. Unchanged from previous exam. Index lesion along the undersurface of the ventral abdominal wall (slightly left of midline measures 3.2 by 7.0 cm, image 78 of series 2. This is increased from 3.2 x 5.0 cm previously. Right lower quadrant index lesion which is involving the ileo psoas muscle measures 3.9  x 4.0 cm, image 83 of series 2. Previously 4.5 x 4.7 cm. Musculoskeletal: No aggressive lytic or sclerotic bone lesions. IMPRESSION: 1. No significant interval change in extensive peritoneal carcinomatosis with associated scalloping of the liver and spleen. 2. No significant change in multiple pulmonary nodules. Electronically Signed   By: Kerby Moors M.D.   On: 10/05/2016 11:32   Ct Abdomen Pelvis W Contrast  Result Date: 10/09/2016 CLINICAL DATA:  Fever for 5 days. No bowel movement for 10 days. History of colon cancer. EXAM: CT ABDOMEN AND PELVIS WITH CONTRAST TECHNIQUE: Multidetector CT imaging of the abdomen and pelvis was performed using the standard protocol following bolus administration of intravenous contrast. CONTRAST:  80 ml ISOVUE-300 IOPAMIDOL (ISOVUE-300) INJECTION 61% COMPARISON:  10/05/2016 FINDINGS: Lower chest: Mild dependent changes in the lung bases. Nodule in the right lung base measuring 4 mm diameter. No change since previous study. Additional nodules seen on prior study are not included within the field of view. Coronary artery calcifications. Hepatobiliary: Scalloping of the liver margin consistent with extrinsic compression from diffuse peritoneal metastases. No discrete focal liver lesions identified. Gallbladder and bile ducts are unremarkable. Portal veins are patent. Pancreas: Pancreas is atrophic but appears unremarkable. No inflammatory changes seen. Spleen: Scalloping of the spleen margin consistent with extrinsic compression from diffuse peritoneal metastasis. No discrete focal intrasplenic lesions. Adrenals/Urinary Tract: Right adrenal gland calcification likely representing area of old infection or hemorrhage. No discrete adrenal gland nodules. Kidneys are symmetrical with symmetrical and homogeneous nephrograms. No hydronephrosis or hydroureter. Bladder is decompressed. Stomach/Bowel: Postoperative changes with gastric bypass. There appears to have been a partial right  hemicolectomy with an ileal colonic anastomosis. Contrast material is demonstrated in the colon suggesting no evidence of bowel obstruction. Bowel are mostly decompressed, some of the bowel loops demonstrate signal areas of wall thickening and edema. The remaining colon is moderately distended with air-fluid levels, progressing since previous study. In the sigmoid region, there are several focal areas of stricturing and wall thickening. This may be causing a degree of partial obstruction. Etiology of the areas of narrowing/ wall thickening in the small bowel and colon probably due to serosal implants from the peritoneal carcinomatosis. Superimposed inflammatory process not entirely excluded. Vascular/Lymphatic: Scattered calcification in the aorta. No aneurysm. Mild prominence of retroperitoneal lymph nodes nonspecific but unchanged. Reproductive: Status post hysterectomy. No adnexal masses. Other: Diffuse peritoneal carcinomatosis with low-attenuation mass is demonstrated throughout the peritoneal. There is un ventral abdominal wall hernia with herniation of peritoneal tumor. Loculated  fluid collections may be present consistent with some malignant ascites. Appearances are similar to previous study. Tumor deposits are demonstrated in the subcutaneous fat and musculature of the right flank anterior to the iliac spine. Musculoskeletal: No acute or significant osseous findings. IMPRESSION: Prominent diffuse peritoneal carcinomatosis with low-attenuation lesions seen throughout the peritoneal cavity and causing scalloping of the margins of the liver and spleen. Some loculated fluid collections may be present, likely due to malignant ascites. Appearances are similar to previous study. There appears to be partial colectomy with remaining colon distended and filled with contrast material and gas. Contrast material in the colon suggest no evidence of complete obstruction. Segmental areas of wall thickening and edema  demonstrated in the small bowel and in the sigmoid colon, likely resulting from serosal implants of carcinomatosis. Superimposed inflammatory process not entirely excluded. Electronically Signed   By: Lucienne Capers M.D.   On: 10/09/2016 23:14   Ct Abdomen Pelvis W Contrast  Result Date: 10/05/2016 CLINICAL DATA:  Colon cancer EXAM: CT CHEST, ABDOMEN, AND PELVIS WITH CONTRAST TECHNIQUE: Multidetector CT imaging of the chest, abdomen and pelvis was performed following the standard protocol during bolus administration of intravenous contrast. CONTRAST:  32mL ISOVUE-300 IOPAMIDOL (ISOVUE-300) INJECTION 61%, 142mL ISOVUE-300 IOPAMIDOL (ISOVUE-300) INJECTION 61% COMPARISON:  08/17/2016 FINDINGS: CT CHEST FINDINGS Cardiovascular: The heart size is normal. No pericardial effusion. Aortic atherosclerosis is noted. Mediastinum/Nodes: The trachea appears patent and is midline. Normal appearance of the esophagus. There is no mediastinal or hilar adenopathy. No axillary or supraclavicular adenopathy. Lungs/Pleura: No pleural effusion. There is no airspace consolidation or atelectasis identified. Index nodule within the left upper lobe is stable measuring 5 mm, image 59 of series 6. There is a index nodule within the anterior right upper lobe which measures 4 mm, image 66 of series 6. Previously 3 mm. 3 mm subpleural nodule in the right lower lobe is stable from previous exam. Musculoskeletal: There are no aggressive lytic or sclerotic bone lesions. CT ABDOMEN PELVIS FINDINGS Hepatobiliary: Scalloping of the liver secondary to peritoneal carcinomatosis. No intraparenchymal liver lesion identified. The gallbladder appears normal. No biliary dilatation. Pancreas: Unremarkable. No pancreatic ductal dilatation or surrounding inflammatory changes. Spleen: Scalloping of the splenic margin secondary to peritoneal carcinomatosis. The appearance is similar to previous exam. Adrenals/Urinary Tract: Normal appearance of the adrenal  glands. No kidney mass or hydronephrosis identified. Urinary bladder appears normal. Stomach/Bowel: Previous gastrojejunal bypass. No pathologic dilatation of the small or large bowel loops. Vascular/Lymphatic: No significant vascular findings are present. No enlarged abdominal or pelvic lymph nodes. Reproductive:  Status post hysterectomy Other: Extensive peritoneal carcinomatosis is identified. Index lesion which extends through the ventral abdominal wall hernia measures 4.5 by 2.8 cm, image 64 of series 2. Unchanged from previous exam. Index lesion along the undersurface of the ventral abdominal wall (slightly left of midline measures 3.2 by 7.0 cm, image 78 of series 2. This is increased from 3.2 x 5.0 cm previously. Right lower quadrant index lesion which is involving the ileo psoas muscle measures 3.9 x 4.0 cm, image 83 of series 2. Previously 4.5 x 4.7 cm. Musculoskeletal: No aggressive lytic or sclerotic bone lesions. IMPRESSION: 1. No significant interval change in extensive peritoneal carcinomatosis with associated scalloping of the liver and spleen. 2. No significant change in multiple pulmonary nodules. Electronically Signed   By: Kerby Moors M.D.   On: 10/05/2016 11:32   Dg Abd 2 Views  Result Date: 10/11/2016 CLINICAL DATA:  Abdominal distention. EXAM: ABDOMEN - 2 VIEW  COMPARISON:  CT 10/09/2016 . FINDINGS: Right IJ line noted with tip projected over the right atrium. Surgical clips and sutures are noted over the abdomen. Persistent colonic distention noted. Reference is made to prior CT report 10/09/2016 . No free air identified. Basilar subsegmental atelectasis . IMPRESSION: 1. Right IJ line with tip noted over the right atrium. 2. Persistent colonic distention. Reference is made to prior CT report 10/09/2016. Electronically Signed   By: Marcello Moores  Register   On: 10/11/2016 09:16    Gene Colee, DO  [facility] Pager [phone]  If 7PM-7AM, please contact  night-coverage [email or web address] Password [facility] 10/11/2016, 12:37 PM   LOS: 1 day

## 2016-10-11 NOTE — Progress Notes (Signed)
Subjective: Abdominal distention and lack of bowel movement x nearly 2 weeks. Some flatus.  Objective: Vital signs in last 24 hours: Temp:  [98.3 F (36.8 C)-99.4 F (37.4 C)] 98.7 F (37.1 C) (03/20 0505) Pulse Rate:  [110-116] 113 (03/20 0505) Resp:  [16] 16 (03/20 0505) BP: (127-130)/(84-93) 130/84 (03/20 0505) SpO2:  [98 %-100 %] 99 % (03/20 0505) Weight change:  Last BM Date: 09/28/16  PE: GEN:  Older-appearing than stated age ABD:  Distended, tympanic, mild diffuse tenderness  Lab Results: CBC    Component Value Date/Time   WBC 6.3 10/11/2016 0525   RBC 3.20 (L) 10/11/2016 0525   HGB 8.7 (L) 10/11/2016 0525   HGB 11.1 (L) 10/04/2016 1327   HCT 27.0 (L) 10/11/2016 0525   HCT 33.8 (L) 10/04/2016 1327   PLT 372 10/11/2016 0525   PLT 221 10/04/2016 1327   MCV 84.4 10/11/2016 0525   MCV 83.7 10/04/2016 1327   MCH 27.2 10/11/2016 0525   MCHC 32.2 10/11/2016 0525   RDW 15.9 (H) 10/11/2016 0525   RDW 16.4 (H) 10/04/2016 1327   LYMPHSABS 0.9 10/09/2016 1628   LYMPHSABS 1.2 10/04/2016 1327   MONOABS 1.2 (H) 10/09/2016 1628   MONOABS 0.3 10/04/2016 1327   EOSABS 0.0 10/09/2016 1628   EOSABS 0.1 10/04/2016 1327   BASOSABS 0.0 10/09/2016 1628   BASOSABS 0.0 10/04/2016 1327   CMP     Component Value Date/Time   NA 133 (L) 10/11/2016 0525   NA 132 (L) 10/04/2016 1327   K 4.1 10/11/2016 0525   K 4.5 10/04/2016 1327   CL 99 (L) 10/11/2016 0525   CO2 25 10/11/2016 0525   CO2 25 10/04/2016 1327   GLUCOSE 99 10/11/2016 0525   GLUCOSE 103 10/04/2016 1327   BUN <5 (L) 10/11/2016 0525   BUN 8.1 10/04/2016 1327   CREATININE 0.55 10/11/2016 0525   CREATININE 0.7 10/04/2016 1327   CALCIUM 8.5 (L) 10/11/2016 0525   CALCIUM 10.3 10/04/2016 1327   PROT 6.8 10/09/2016 1628   PROT 7.5 10/04/2016 1327   ALBUMIN 2.6 (L) 10/09/2016 1628   ALBUMIN 3.2 (L) 10/04/2016 1327   AST 24 10/09/2016 1628   AST 11 10/04/2016 1327   ALT 19 10/09/2016 1628   ALT 9 10/04/2016 1327   ALKPHOS 116 10/09/2016 1628   ALKPHOS 128 10/04/2016 1327   BILITOT 0.6 10/09/2016 1628   BILITOT 0.42 10/04/2016 1327   GFRNONAA >60 10/11/2016 0525   GFRAA >60 10/11/2016 0525   CT scan abdomen/pelvis:  Personally reviewed.  Colonic distention with multifocal stricturing in region of sigmoid colon.  Has peritoneal implants and ascites.    Assessment:  1.  Obstipation and likely obstruction (partial) in region of sigmoid colon.  Likely from peritoneal implants.  Plan:  1.  I have reviewed CT scan and discussed results with patient.  I don't think stent would be feasible or safe, given her peritoneal implants and suspected multifocal stricturing.  As I result, I don't feel comfortable pursuing stent placement.  I have also discussed with Dr. Watt Climes, who will render an opinion as well. 2.  Either Dr. Watt Climes or will put in chart note once Dr. Watt Climes has rendered his opinion.  If both of [place] don't feel like a stent would be helpful, then there unfortunately is not much from GI endoscopic perspective that can be done.    Landry Dyke 10/11/2016, 2:37 PM   Pager [phone] If no answer or after 5 PM call [phone]

## 2016-10-11 NOTE — Progress Notes (Signed)
Nursing Note; Tolerated Fleets enema well.R; Few very small pebbles sized,light gray soft  pieces and "A LOT " of gas.Pt says she feels much better.Abd still quite distended.wbb

## 2016-10-11 NOTE — Progress Notes (Signed)
Subjective: Minimal success with the Enema, sounds like it was uncomfortable for her and she only got a small stool ball like result.  No BM since 09/28/16.  No real improvement.  She is handling clears OK.    Objective: Vital signs in last 24 hours: Temp:  [98.3 F (36.8 C)-99.4 F (37.4 C)] 98.7 F (37.1 C) (03/20 0505) Pulse Rate:  [110-116] 113 (03/20 0505) Resp:  [16] 16 (03/20 0505) BP: (127-130)/(84-93) 130/84 (03/20 0505) SpO2:  [98 %-100 %] 99 % (03/20 0505) Last BM Date: 09/28/16 480 PO 1200 IV 600 urine BM x 1 Afebrile, Tachycardic BP OK Labs OK, K+ 4.1 2 view abd: Right IJ line noted with tip projected over the right atrium. Surgical clips and sutures are noted over the abdomen. Persistent colonic distention noted. Reference is made to prior CT report 10/09/2016 . No free air identified. Basilar subsegmental atelectasis .    Intake/Output from previous day: 03/19 0701 - 03/20 0700 In: 1680 [P.O.:480; I.V.:1200] Out: 600 [Urine:600] Intake/Output this shift: No intake/output data recorded.  General appearance: alert, cooperative and no distress GI: distended, No BS, not much flatus, not much results  Lab Results:   Recent Labs  10/10/16 0502 10/11/16 0525  WBC 5.7 6.3  HGB 8.5* 8.7*  HCT 26.5* 27.0*  PLT 335 372    BMET  Recent Labs  10/10/16 0502 10/11/16 0525  NA 130* 133*  K 4.3 4.1  CL 97* 99*  CO2 26 25  GLUCOSE 103* 99  BUN 5* <5*  CREATININE 0.49 0.55  CALCIUM 8.4* 8.5*   PT/INR No results for input(s): LABPROT, INR in the last 72 hours.   Recent Labs Lab 10/04/16 1327 10/09/16 1628  AST 11 24  ALT 9 19  ALKPHOS 128 116  BILITOT 0.42 0.6  PROT 7.5 6.8  ALBUMIN 3.2* 2.6*     Lipase     Component Value Date/Time   LIPASE <10 (L) 10/09/2016 1628     Studies/Results: Dg Chest 2 View  Result Date: 10/09/2016 CLINICAL DATA:  Colon cancer, constipation EXAM: CHEST  2 VIEW COMPARISON:  Chest CT 10/05/2016  FINDINGS: Port with tip in distal SVC. No pleural fluid, pulmonary edema, or infiltrate. No pneumothorax. Gas-filled loop of colon noted. IMPRESSION: No acute cardiopulmonary process. Electronically Signed   By: Suzy Bouchard M.D.   On: 10/09/2016 16:13   Ct Abdomen Pelvis W Contrast  Result Date: 10/09/2016 CLINICAL DATA:  Fever for 5 days. No bowel movement for 10 days. History of colon cancer. EXAM: CT ABDOMEN AND PELVIS WITH CONTRAST TECHNIQUE: Multidetector CT imaging of the abdomen and pelvis was performed using the standard protocol following bolus administration of intravenous contrast. CONTRAST:  80 ml ISOVUE-300 IOPAMIDOL (ISOVUE-300) INJECTION 61% COMPARISON:  10/05/2016 FINDINGS: Lower chest: Mild dependent changes in the lung bases. Nodule in the right lung base measuring 4 mm diameter. No change since previous study. Additional nodules seen on prior study are not included within the field of view. Coronary artery calcifications. Hepatobiliary: Scalloping of the liver margin consistent with extrinsic compression from diffuse peritoneal metastases. No discrete focal liver lesions identified. Gallbladder and bile ducts are unremarkable. Portal veins are patent. Pancreas: Pancreas is atrophic but appears unremarkable. No inflammatory changes seen. Spleen: Scalloping of the spleen margin consistent with extrinsic compression from diffuse peritoneal metastasis. No discrete focal intrasplenic lesions. Adrenals/Urinary Tract: Right adrenal gland calcification likely representing area of old infection or hemorrhage. No discrete adrenal gland nodules. Kidneys are symmetrical with  symmetrical and homogeneous nephrograms. No hydronephrosis or hydroureter. Bladder is decompressed. Stomach/Bowel: Postoperative changes with gastric bypass. There appears to have been a partial right hemicolectomy with an ileal colonic anastomosis. Contrast material is demonstrated in the colon suggesting no evidence of bowel  obstruction. Bowel are mostly decompressed, some of the bowel loops demonstrate signal areas of wall thickening and edema. The remaining colon is moderately distended with air-fluid levels, progressing since previous study. In the sigmoid region, there are several focal areas of stricturing and wall thickening. This may be causing a degree of partial obstruction. Etiology of the areas of narrowing/ wall thickening in the small bowel and colon probably due to serosal implants from the peritoneal carcinomatosis. Superimposed inflammatory process not entirely excluded. Vascular/Lymphatic: Scattered calcification in the aorta. No aneurysm. Mild prominence of retroperitoneal lymph nodes nonspecific but unchanged. Reproductive: Status post hysterectomy. No adnexal masses. Other: Diffuse peritoneal carcinomatosis with low-attenuation mass is demonstrated throughout the peritoneal. There is un ventral abdominal wall hernia with herniation of peritoneal tumor. Loculated fluid collections may be present consistent with some malignant ascites. Appearances are similar to previous study. Tumor deposits are demonstrated in the subcutaneous fat and musculature of the right flank anterior to the iliac spine. Musculoskeletal: No acute or significant osseous findings. IMPRESSION: Prominent diffuse peritoneal carcinomatosis with low-attenuation lesions seen throughout the peritoneal cavity and causing scalloping of the margins of the liver and spleen. Some loculated fluid collections may be present, likely due to malignant ascites. Appearances are similar to previous study. There appears to be partial colectomy with remaining colon distended and filled with contrast material and gas. Contrast material in the colon suggest no evidence of complete obstruction. Segmental areas of wall thickening and edema demonstrated in the small bowel and in the sigmoid colon, likely resulting from serosal implants of carcinomatosis. Superimposed  inflammatory process not entirely excluded. Electronically Signed   By: Lucienne Capers M.D.   On: 10/09/2016 23:14   Dg Abd 2 Views  Result Date: 10/11/2016 CLINICAL DATA:  Abdominal distention. EXAM: ABDOMEN - 2 VIEW COMPARISON:  CT 10/09/2016 . FINDINGS: Right IJ line noted with tip projected over the right atrium. Surgical clips and sutures are noted over the abdomen. Persistent colonic distention noted. Reference is made to prior CT report 10/09/2016 . No free air identified. Basilar subsegmental atelectasis . IMPRESSION: 1. Right IJ line with tip noted over the right atrium. 2. Persistent colonic distention. Reference is made to prior CT report 10/09/2016. Electronically Signed   By: Marcello Moores  Register   On: 10/11/2016 09:16    Medications: . docusate sodium  100 mg Oral BID  . enoxaparin (LOVENOX) injection  40 mg Subcutaneous QHS  . oxyCODONE  10 mg Oral Q4H   . sodium chloride 1,000 mL (10/11/16 0519)    Assessment/Plan Stage IV colon cancer with carcinomatosis Nausea/constipation Moderate protein calorie malnutrition Anemia of chronic disease FEN: IV fluids/clear liquids ID:  No abx DVT:  Lovenox  Plan:  Continue with No PO's, bowel rest, mobilize if we can.  Dr. Redmond Pulling thinks GI should look at her and see if there is a stricture in the sigmoid that can be stented.          LOS: 1 day    [name] 10/11/2016 [phone]

## 2016-10-12 DIAGNOSIS — E46 Unspecified protein-calorie malnutrition: Secondary | ICD-10-CM

## 2016-10-12 LAB — BASIC METABOLIC PANEL
Anion gap: 10 (ref 5–15)
CO2: 23 mmol/L (ref 22–32)
CREATININE: 0.52 mg/dL (ref 0.44–1.00)
Calcium: 8.2 mg/dL — ABNORMAL LOW (ref 8.9–10.3)
Chloride: 100 mmol/L — ABNORMAL LOW (ref 101–111)
GFR calc Af Amer: >60 mL/min (ref >60)
Glucose, Bld: 72 mg/dL (ref 65–99)
Potassium: 3.8 mmol/L (ref 3.5–5.1)
Sodium: 133 mmol/L — ABNORMAL LOW (ref 135–145)

## 2016-10-12 LAB — MAGNESIUM: MAGNESIUM: 2.1 mg/dL (ref 1.7–2.4)

## 2016-10-12 MED ORDER — OXYCODONE HCL 5 MG PO TABS
10.0000 mg | ORAL_TABLET | Freq: Once | ORAL | Status: AC
  Administered 2016-10-12: 10 mg via ORAL
  Filled 2016-10-12: qty 2

## 2016-10-12 MED ORDER — MORPHINE SULFATE (PF) 4 MG/ML IV SOLN
2.0000 mg | INTRAVENOUS | Status: DC | PRN
  Administered 2016-10-12 – 2016-10-14 (×5): 4 mg via INTRAVENOUS
  Filled 2016-10-12 (×5): qty 1

## 2016-10-12 NOTE — Progress Notes (Signed)
  Subjective: No significant change. She had a little smear of stool, no repeat enema yesterday.  She says it feels less tight 60 mg oxycodone yesterday  Objective: Vital signs in last 24 hours: Temp:  [98.4 F (36.9 C)-99 F (37.2 C)] 98.4 F (36.9 C) (03/21 0426) Pulse Rate:  [108-120] 108 (03/21 0426) Resp:  [16-18] 16 (03/21 0426) BP: (126-133)/(87-93) 126/89 (03/21 0426) SpO2:  [99 %-100 %] 99 % (03/21 0426) Last BM Date: 09/28/16  600 PO 1200 IV Urine 650  - 1 shift recorded NO BM recorded Afebrile, VSS  Na up to 133 2 view abd 10/11/16:  Ongoing obstruction  Intake/Output from previous day: 03/20 0701 - 03/21 0700 In: 1800 [P.O.:600; I.V.:1200] Out: 650 [Urine:650] Intake/Output this shift: No intake/output data recorded.  General appearance: alert, cooperative and no distress GI: distended abdomen few BS, high pitched, tight but not uncomfortable on exam.    Lab Results:   Recent Labs  10/10/16 0502 10/11/16 0525  WBC 5.7 6.3  HGB 8.5* 8.7*  HCT 26.5* 27.0*  PLT 335 372    BMET  Recent Labs  10/11/16 0525 10/12/16 0536  NA 133* 133*  K 4.1 3.8  CL 99* 100*  CO2 25 23  GLUCOSE 99 72  BUN <5* <5*  CREATININE 0.55 0.52  CALCIUM 8.5* 8.2*   PT/INR No results for input(s): LABPROT, INR in the last 72 hours.   Recent Labs Lab 10/09/16 1628  AST 24  ALT 19  ALKPHOS 116  BILITOT 0.6  PROT 6.8  ALBUMIN 2.6*     Lipase     Component Value Date/Time   LIPASE <10 (L) 10/09/2016 1628     Studies/Results: Dg Abd 2 Views  Result Date: 10/11/2016 CLINICAL DATA:  Abdominal distention. EXAM: ABDOMEN - 2 VIEW COMPARISON:  CT 10/09/2016 . FINDINGS: Right IJ line noted with tip projected over the right atrium. Surgical clips and sutures are noted over the abdomen. Persistent colonic distention noted. Reference is made to prior CT report 10/09/2016 . No free air identified. Basilar subsegmental atelectasis . IMPRESSION: 1. Right IJ line with tip  noted over the right atrium. 2. Persistent colonic distention. Reference is made to prior CT report 10/09/2016. Electronically Signed   By: Marcello Moores  Register   On: 10/11/2016 09:16    Medications: . docusate sodium  100 mg Oral BID  . enoxaparin (LOVENOX) injection  40 mg Subcutaneous QHS  . oxyCODONE  10 mg Oral Q4H   . sodium chloride 1,000 mL (10/12/16 0022)    Assessment/Plan Stage IV colon cancer with carcinomatosis Nausea/constipation Moderate protein calorie malnutrition Anemia of chronic disease FEN: IV fluids/clear liquids ID:  No abx DVT:  Lovenox   Plan:  Awaiting GI input.  Dr. Redmond Pulling with talk with her and her Fiance who is POA.        LOS: 2 days    Wolf Boulay 10/12/2016 [phone]

## 2016-10-12 NOTE — Progress Notes (Signed)
Dr. Watt Climes and I have both discussed case.  We don't feel there is strong likelihood of success with stenting based on CT findings.  Agree with gastrograffin enema to explore possibility of focal lesion amenable to stenting; if no focal lesion or focal stricture is seen (I.e., if multiple discrete regions of narrowing are noted) then we would not pursue colonic stenting.

## 2016-10-12 NOTE — Progress Notes (Signed)
PROGRESS NOTE    [name]  XBJ:[id number] DOB: [birth year] DOA: 10/09/2016 PCP: Reginia Naas, MD   Brief Narrative:  51 year old female with a history of stage IIIB colon cancer with mets to lung and liver and peritoneal carcinomatosis currently on Folfiri and Avastin, anxiety/depression, hyponatremia presenting with worsening abdominal pain. The patient states that she has not had a bowel movement in 10 days. She tried over-the-counter laxatives including magnesium citrate x 5without results. She has had decreased oral intake and increasing fatigue over the past week. The patient was seen at the [facility] center on 10/04/2016 with worsening abdominal pain. At that time, the patient's oxycodone was increased to 10 mg every 4 hours when necessary pain and CT of the abdomen and pelvis was ordered.   Regarding her oncologic history, the patient was diagnosed with colon cancer and underwent right hemicolectomy on 08/03/2015. She was initiated on chemotherapy on 09/18/2015. PET scan imaging on 05/20/2016 revealed hypermetabolic peritoneal carcinomatosis with 5 scattered pulmonary nodules with possible hypermetabolic mass in the right ventral pelvic wall.    Assessment & Plan:   Principal Problem:   Abdominal pain Active Problems:   Cancer of right colon (HCC)   Hyponatremia   Hypoalbuminemia due to protein-calorie malnutrition (HCC)   UTI (urinary tract infection)   Hyperglycemia   Uncontrolled pain   Peritoneal carcinomatosis (HCC)   Malignant ascites   Malignant neoplasm of colon (HCC)   SBO (small bowel obstruction)   Intractable abdominal pain/uncontrolled pain -Secondary to peritoneal carcinomatosis causing extrinsic compression -10/09/2016 CT abdomen and pelvis--progression of air-fluid levels with moderately distended colon. Several local areas of stricturing and wall thickening in the sigmoid colon likely representing a degree of partial obstruction. Small bowel  loops with signal areas of wall thickening and edema. -appreciate general surgery-->enema with minimal success -appreciate MedOnc -consult [facility]-->?possible colonic stent however per GI no strong likelihood of success with stenting. General surgery considering Gastrografin enema for further evaluation of strictures and possible surgical intervention. -continue home dose oxycodone. 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 Burr Medico followup  Iron deficiency anemia -managed by Dr. Burr Medico  Failure to thrive  -consulted nutrition  Fibromyalgia and depression -Continue home dose alprazolam   DVT prophylaxis: Lovenox Code Status: Full. Family Communication: Updated patient and fianc at bedside. Disposition Plan: Home when obstruction has resolved and per general surgery.   Consultants:   Gen. surgery: Dr. Redmond Pulling 10/10/2016  Gastroenterology: Dr. Paulita Fujita  Oncology: Dr Burr Medico  Procedures:   CT abdomen and pelvis 10/09/2016  Abdominal xray 10/11/2016,   CXR 10/09/2016  Antimicrobials:   None   Subjective: No flatus. No bowel movement. Patient complaining of abdominal pain.  Objective: Vitals:   10/11/16 0505 10/11/16 1459 10/11/16 2039 10/12/16 0426  BP: 130/84 133/87 (!) 129/93 126/89  Pulse: (!) 113 (!) 120 (!) 112 (!) 108  Resp: 16 18 16 16   Temp: 98.7 F (37.1 C) 99 F (37.2 C) 98.9 F (37.2 C) 98.4 F (36.9 C)  TempSrc: Oral Oral Axillary Oral  SpO2: 99% 100% 100% 99%  Weight:      Height:        Intake/Output Summary (Last 24 hours) at 10/12/16 1320 Last data filed at 10/12/16 0555  Gross per 24  hour  Intake             1800  ml  Output              650 ml  Net             1150 ml   Filed Weights   10/09/16 1523 10/10/16 0153  Weight: 53.1 kg (117 lb) 56 kg (123 lb 7.3 oz)    Examination:  General exam: Appears calm and comfortable  Respiratory system: Clear to auscultation. Respiratory effort normal. Cardiovascular system: S1 & S2 heard, RRR. No JVD, murmurs, rubs, gallops or clicks. No pedal edema. Gastrointestinal system: Abdomen is distended, tight and some tenderness to palpation. No organomegaly or masses felt. Hyperactive bowel sounds. Central nervous system: Alert and oriented. No focal neurological deficits. Extremities: Symmetric 5 x 5 power. Skin: No rashes, lesions or ulcers Psychiatry: Judgement and insight appear normal. Mood & affect appropriate.     Data Reviewed: I have personally reviewed following labs and imaging studies  CBC:  Recent Labs Lab 10/09/16 1628 10/10/16 0502 10/11/16 0525  WBC 6.4 5.7 6.3  NEUTROABS 4.2  --   --   HGB 9.1* 8.5* 8.7*  HCT 28.5* 26.5* 27.0*  MCV 82.6 82.8 84.4  PLT 364 335 378   Basic Metabolic Panel:  Recent Labs Lab 10/09/16 1628 10/10/16 0502 10/11/16 0525 10/12/16 0536  NA 129* 130* 133* 133*  K 4.3 4.3 4.1 3.8  CL 92* 97* 99* 100*  CO2 28 26 25 23   GLUCOSE 112* 103* 99 72  BUN 7 5* <5* <5*  CREATININE 0.60 0.49 0.55 0.52  CALCIUM 9.0 8.4* 8.5* 8.2*  MG  --   --  2.4 2.1   GFR: Estimated Creatinine Clearance: 64.5 mL/min (by C-G formula based on SCr of 0.52 mg/dL). Liver Function Tests:  Recent Labs Lab 10/09/16 1628  AST 24  ALT 19  ALKPHOS 116  BILITOT 0.6  PROT 6.8  ALBUMIN 2.6*    Recent Labs Lab 10/09/16 1628  LIPASE <10*   No results for input(s): AMMONIA in the last 168 hours. Coagulation Profile: No results for input(s): INR, PROTIME in the last 168 hours. Cardiac Enzymes: No results for input(s): CKTOTAL, CKMB, CKMBINDEX, TROPONINI in the last 168 hours. BNP (last 3 results) No results for  input(s): PROBNP in the last 8760 hours. HbA1C: No results for input(s): HGBA1C in the last 72 hours. CBG: No results for input(s): GLUCAP in the last 168 hours. Lipid Profile: No results for input(s): CHOL, HDL, LDLCALC, TRIG, CHOLHDL, LDLDIRECT in the last 72 hours. Thyroid Function Tests: No results for input(s): TSH, T4TOTAL, FREET4, T3FREE, THYROIDAB in the last 72 hours. Anemia Panel: No results for input(s): VITAMINB12, FOLATE, FERRITIN, TIBC, IRON, RETICCTPCT in the last 72 hours. Sepsis Labs:  Recent Labs Lab 10/09/16 2050  LATICACIDVEN 0.53    Recent Results (from the past 240 hour(s))  Urine Culture     Status: Abnormal   Collection Time: 10/04/16  3:19 PM  Result Value Ref Range Status   Urine Culture, Routine Final report (A)  Final   Urine Culture result 1 Comment (A)  Final    Comment: Beta hemolytic Streptococcus, group B 10,000-25,000 colony forming units per mL Penicillin and ampicillin are drugs of choice for treatment of beta-hemolytic streptococcal infections. Susceptibility testing of penicillins and other beta-lactam agents approved by the FDA for treatment of beta-hemolytic streptococcal infections need not be performed routinely because nonsusceptible isolates are extremely  rare in any beta-hemolytic streptococcus and have not been reported for Streptococcus pyogenes (group A). (CLSI 2011)   Urine culture     Status: Abnormal   Collection Time: 10/09/16  4:29 PM  Result Value Ref Range Status   Specimen Description URINE, RANDOM  Final   Special Requests NONE  Final   Culture (A)  Final    <10,000 COLONIES/mL INSIGNIFICANT GROWTH Performed at [facility], [place] [address] [place], [place] [address]    Report Status 10/11/2016 FINAL  Final  Blood culture (routine x 2)     Status: None (Preliminary result)   Collection Time: 10/09/16  4:29 PM  Result Value Ref Range Status   Specimen Description BLOOD RIGHT ANTECUBITAL  Final   Special  Requests BOTTLES DRAWN AEROBIC AND ANAEROBIC 5 CC  Final   Culture   Final    NO GROWTH 3 DAYS Performed at [facility], [place] [address], [place], [place] [address]    Report Status PENDING  Incomplete  Blood culture (routine x 2)     Status: None (Preliminary result)   Collection Time: 10/09/16  4:29 PM  Result Value Ref Range Status   Specimen Description BLOOD PORTA CATH  Final   Special Requests IN PEDIATRIC BOTTLE 2ML  Final   Culture   Final    NO GROWTH 3 DAYS Performed at [facility], [place] [address], [place], [place] [address]    Report Status PENDING  Incomplete         Radiology Studies: Dg Abd 2 Views  Result Date: 10/11/2016 CLINICAL DATA:  Abdominal distention. EXAM: ABDOMEN - 2 VIEW COMPARISON:  CT 10/09/2016 . FINDINGS: Right IJ line noted with tip projected over the right atrium. Surgical clips and sutures are noted over the abdomen. Persistent colonic distention noted. Reference is made to prior CT report 10/09/2016 . No free air identified. Basilar subsegmental atelectasis . IMPRESSION: 1. Right IJ line with tip noted over the right atrium. 2. Persistent colonic distention. Reference is made to prior CT report 10/09/2016. Electronically Signed   By: Marcello Moores  Register   On: 10/11/2016 09:16        Scheduled Meds: . docusate sodium  100 mg Oral BID  . enoxaparin (LOVENOX) injection  40 mg Subcutaneous QHS  . oxyCODONE  10 mg Oral Q4H  . oxyCODONE  10 mg Oral Once   Continuous Infusions: . sodium chloride 1,000 mL (10/12/16 1001)     LOS: 2 days    Time spent: 65 mins    Amelya Mabry, MD [facility] Pager [phone]  If 7PM-7AM, please contact night-coverage [email or web address] Password [email or web address] 10/12/2016, 1:20 PM

## 2016-10-12 NOTE — Progress Notes (Signed)
[name]   DOB:01/07/1966   TK#:[id number]   XNT#:[id number]  ONCOLOGY FOLLOW UP   Subjective: [name] still has no BM or flatus, abdominal pain is managed by pain meds. She is going to have gastrograffin enema study to see if she is a candidate for colonic stenting, otherwise, Dr. Redmond Pulling has offered exploratory laparotomy with loop colostomy with possible G-tube placement. Pt agrees.   Objective:  Vitals:   10/12/16 1448 10/12/16 2025  BP: 127/87 (!) 132/92  Pulse: (!) 114 (!) 109  Resp: 17 16  Temp: 99.3 F (37.4 C) 98.9 F (37.2 C)    Body mass index is 24.11 kg/m.  Intake/Output Summary (Last 24 hours) at 10/12/16 2113 Last data filed at 10/12/16 1910  Gross per 24 hour  Intake             3385 ml  Output                0 ml  Net             3385 ml     Sclerae unicteric  Oropharynx clear  No peripheral adenopathy  Lungs clear -- no rales or rhonchi  Heart regular rate and rhythm  Abdomen distended, diffuse tenderness   MSK no focal spinal tenderness, no peripheral edema  Neuro nonfocal   CBG (last 3)  No results for input(s): GLUCAP in the last 72 hours.   Labs:  Lab Results  Component Value Date   WBC 6.3 10/11/2016   HGB 8.7 (L) 10/11/2016   HCT 27.0 (L) 10/11/2016   MCV 84.4 10/11/2016   PLT 372 10/11/2016   NEUTROABS 4.2 10/09/2016   CMP Latest Ref Rng & Units 10/12/2016 10/11/2016 10/10/2016  Glucose 65 - 99 mg/dL 72 99 103(H)  BUN 6 - 20 mg/dL <5(L) <5(L) 5(L)  Creatinine 0.44 - 1.00 mg/dL 0.52 0.55 0.49  Sodium 135 - 145 mmol/L 133(L) 133(L) 130(L)  Potassium 3.5 - 5.1 mmol/L 3.8 4.1 4.3  Chloride 101 - 111 mmol/L 100(L) 99(L) 97(L)  CO2 22 - 32 mmol/L _0 Calcium 8.9 - 10.3 mg/dL 8.2(L) 8.5(L) 8.4(L)  Total Protein 6.5 - 8.1 g/dL - - -  Total Bilirubin 0.3 - 1.2 mg/dL - - -  Alkaline Phos 38 - 126 U/L - - -  AST 15 - 41 U/L - - -  ALT 14 - 54 U/L - - -    Urine Studies No results for input(s): UHGB, CRYS in the last 72  hours.  Invalid input(s): UACOL, UAPR, USPG, UPH, UTP, UGL, UKET, UBIL, UNIT, UROB, ULEU, UEPI, UWBC, [place], [place], [place], [place], [place]  Basic Metabolic Panel:  Recent Labs Lab 10/09/16 1628 10/10/16 0502 10/11/16 0525 10/12/16 0536  NA 129* 130* 133* 133*  K 4.3 4.3 4.1 3.8  CL 92* 97* 99* 100*  CO2 _1 GLUCOSE 112* 103* 99 72  BUN 7 5* <5* <5*  CREATININE 0.60 0.49 0.55 0.52  CALCIUM 9.0 8.4* 8.5* 8.2*  MG  --   --  2.4 2.1   GFR Estimated Creatinine Clearance: 64.5 mL/min (by C-G formula based on SCr of 0.52 mg/dL). Liver Function Tests:  Recent Labs Lab 10/09/16 1628  AST 24  ALT 19  ALKPHOS 116  BILITOT 0.6  PROT 6.8  ALBUMIN 2.6*    Recent Labs Lab 10/09/16 1628  LIPASE <10*   No results for input(s): AMMONIA in the last 168 hours. Coagulation profile No results for  input(s): INR, PROTIME in the last 168 hours.  CBC:  Recent Labs Lab 10/09/16 1628 10/10/16 0502 10/11/16 0525  WBC 6.4 5.7 6.3  NEUTROABS 4.2  --   --   HGB 9.1* 8.5* 8.7*  HCT 28.5* 26.5* 27.0*  MCV 82.6 82.8 84.4  PLT 364 335 372   Cardiac Enzymes: No results for input(s): CKTOTAL, CKMB, CKMBINDEX, TROPONINI in the last 168 hours. BNP: Invalid input(s): POCBNP CBG: No results for input(s): GLUCAP in the last 168 hours. D-Dimer No results for input(s): DDIMER in the last 72 hours. Hgb A1c No results for input(s): HGBA1C in the last 72 hours. Lipid Profile No results for input(s): CHOL, HDL, LDLCALC, TRIG, CHOLHDL, LDLDIRECT in the last 72 hours. Thyroid function studies No results for input(s): TSH, T4TOTAL, T3FREE, THYROIDAB in the last 72 hours.  Invalid input(s): FREET3 Anemia work up No results for input(s): VITAMINB12, FOLATE, FERRITIN, TIBC, IRON, RETICCTPCT in the last 72 hours. Microbiology Recent Results (from the past 240 hour(s))  Urine Culture     Status: Abnormal   Collection Time: 10/04/16  3:19 PM  Result Value Ref Range Status   Urine Culture,  Routine Final report (A)  Final   Urine Culture result 1 Comment (A)  Final    Comment: Beta hemolytic Streptococcus, group B 10,000-25,000 colony forming units per mL Penicillin and ampicillin are drugs of choice for treatment of beta-hemolytic streptococcal infections. Susceptibility testing of penicillins and other beta-lactam agents approved by the FDA for treatment of beta-hemolytic streptococcal infections need not be performed routinely because nonsusceptible isolates are extremely rare in any beta-hemolytic streptococcus and have not been reported for Streptococcus pyogenes (group A). (CLSI 2011)   Urine culture     Status: Abnormal   Collection Time: 10/09/16  4:29 PM  Result Value Ref Range Status   Specimen Description URINE, RANDOM  Final   Special Requests NONE  Final   Culture (A)  Final    <10,000 COLONIES/mL INSIGNIFICANT GROWTH Performed at [facility], [place] [address] [place], [place] [address]    Report Status 10/11/2016 FINAL  Final  Blood culture (routine x 2)     Status: None (Preliminary result)   Collection Time: 10/09/16  4:29 PM  Result Value Ref Range Status   Specimen Description BLOOD RIGHT ANTECUBITAL  Final   Special Requests BOTTLES DRAWN AEROBIC AND ANAEROBIC 5 CC  Final   Culture   Final    NO GROWTH 3 DAYS Performed at [facility], [place] [address], [place], [place] [address]    Report Status PENDING  Incomplete  Blood culture (routine x 2)     Status: None (Preliminary result)   Collection Time: 10/09/16  4:29 PM  Result Value Ref Range Status   Specimen Description BLOOD PORTA CATH  Final   Special Requests IN PEDIATRIC BOTTLE 2ML  Final   Culture   Final    NO GROWTH 3 DAYS Performed at [facility], [place] [address] [place], [place] [address]    Report Status PENDING  Incomplete      Studies:  Dg Abd 2 Views  Result Date: 10/11/2016 CLINICAL DATA:  Abdominal distention. EXAM: ABDOMEN - 2 VIEW COMPARISON:  CT  10/09/2016 . FINDINGS: Right IJ line noted with tip projected over the right atrium. Surgical clips and sutures are noted over the abdomen. Persistent colonic distention noted. Reference is made to prior CT report 10/09/2016 . No free air identified. Basilar subsegmental atelectasis .  IMPRESSION: 1. Right IJ line with tip noted over the right atrium. 2. Persistent colonic distention. Reference is made to prior CT report 10/09/2016. Electronically Signed   By: Marcello Moores  Register   On: 10/11/2016 09:16    Assessment: 51 y.o. female, with past medical history of fibromyalgia, depression, presented with ruptured cecum and pericolonic abscess, required multiple drainage, antibiotics, and eventually right colectomy, which revealed stage IIIA colon adenocarcinoma. She developed peritoneal carcinomatosis in 04/2016. She was admitted for bowel obstruction.   1. Metastatic right colon cancer to peritoneum, on palliative chemo 2. Partial bowel obstruction, likely secondary to peritoneum carcinomatosis 3. Abdominal pain secondary to #1 and #2 4. Anemia secondary to chemotherapy, iron deficiency and malignancy 5. Moderate to severe malnutrition 6. Fibromyalgia and depression   Plan:  -I spoke with Dr. Redmond Pulling yesterday, appreciate his input  -gastrograffin enema study tomorrow morning, to see if she is a candidate for clonic stenting. But more likely she will have exploratory laparotomy with loop colostomy  By Dr. Redmond Pulling  -If her bowel obstruction resolves and her nutritional status improves, I would offer further systemic therapy, likely change to third line therapy, such as immunotherapy with oral MEK inhibitor, regoraferib, etc. Her tumor genomic testing also showed her tumor contains CCND1 amplification, so I may also consider CDK4/6 inhibitor such as palbociclib or ribociclib (off label use), which has showed some response in CCND1 amplified solid tumor in early phase clinical trials.   -again we discussed the  goal of care is palliative and prolong her life -I also spoke with her fianc when I met him on my way to see [place] today. He is tearfully, understands the very poor overall prognosis but remains to be hopeful if surgery can get her chance to try more systemic therapy.  -I will follow up.     Truitt Merle, MD 10/12/2016  9:13 PM

## 2016-10-12 NOTE — Progress Notes (Signed)
Nutrition Brief Note  Patient identified on the Malnutrition Screening Tool (MST) Report  Will monitor for nutritional needs.  Wt Readings from Last 15 Encounters:  10/10/16 123 lb 7.3 oz (56 kg)  10/04/16 117 lb 12.8 oz (53.4 kg)  09/15/16 123 lb 1.6 oz (55.8 kg)  09/01/16 123 lb 14.4 oz (56.2 kg)  08/18/16 124 lb 12.8 oz (56.6 kg)  08/04/16 126 lb (57.2 kg)  07/07/16 127 lb 8 oz (57.8 kg)  06/23/16 128 lb 11.2 oz (58.4 kg)  06/02/16 128 lb 14.4 oz (58.5 kg)  05/23/16 131 lb (59.4 kg)  05/06/16 134 lb 4.8 oz (60.9 kg)  03/18/16 132 lb 9.6 oz (60.1 kg)  02/26/16 132 lb 4.8 oz (60 kg)  02/04/16 134 lb 8 oz (61 kg)  01/15/16 133 lb 8 oz (60.6 kg)    Body mass index is 24.11 kg/m. Patient meets criteria for normal based on current BMI.   Current diet order is clear liquid, patient is consuming approximately 50-100% of meals at this time. Labs and medications reviewed.   No nutrition interventions warranted at this time. If nutrition issues arise, please consult RD.   Clayton Bibles, MS, RD, LDN Pager: [phone] After Hours Pager: [phone]

## 2016-10-12 NOTE — Consult Note (Signed)
[place] [facility] Nurse ostomy consult note Patient consult for ostomy education for patient and her fiance; they are contemplating ostomy surgery and have many questions about stoma, pouch, routine, quality of life, etc.  Extended session for education about GI A&P, stoma characteristics, pouch characteristics.  Pouch sample provided and discussed; patient and fiance touch, view simulated application.  Demonstration of simulated emptying. Educational booklet provided for patient, fiance reports he is a Microbiologist.  Couple is much less anxious upon conclusion of visit than upon my entry into the room; fiancee states tearfully that they are just incredibly overwhelmed. They both express appreciation for visit.  [facility] nursing team will not follow, but will remain available to this patient, the nursing, srugical and medical teams. Please re-consult if needed if marking is requested or if ostomy is created. Thanks, Maudie Flakes, MSN, RN, [place], Arther Abbott  Pager# [phone]

## 2016-10-12 NOTE — Progress Notes (Signed)
Will track down GI this morning But if they feel flex sig not indicated, I will talk with radiology about gastrograffin enema study to evaluate sigmoid  [name]. Redmond Pulling, MD, FACS General, Bariatric, & Minimally Invasive Surgery [facility], [place]

## 2016-10-13 ENCOUNTER — Ambulatory Visit: Payer: Managed Care, Other (non HMO) | Admitting: Hematology

## 2016-10-13 ENCOUNTER — Inpatient Hospital Stay (HOSPITAL_COMMUNITY): Payer: 59 | Admitting: Anesthesiology

## 2016-10-13 ENCOUNTER — Other Ambulatory Visit: Payer: Managed Care, Other (non HMO)

## 2016-10-13 ENCOUNTER — Inpatient Hospital Stay (HOSPITAL_COMMUNITY): Payer: 59

## 2016-10-13 ENCOUNTER — Encounter (HOSPITAL_COMMUNITY): Payer: Self-pay | Admitting: Anesthesiology

## 2016-10-13 ENCOUNTER — Ambulatory Visit: Payer: 59

## 2016-10-13 ENCOUNTER — Encounter (HOSPITAL_COMMUNITY)
Admission: EM | Disposition: A | Discharge status: Home Health | Payer: Self-pay | Source: Home / Self Care | Attending: Internal Medicine

## 2016-10-13 HISTORY — PX: LAPAROSCOPY: SHX197

## 2016-10-13 LAB — SURGICAL PCR SCREEN
MRSA, PCR: NEGATIVE
STAPHYLOCOCCUS AUREUS: NEGATIVE

## 2016-10-13 LAB — TYPE AND SCREEN
ABO/RH(D): A POS
Antibody Screen: NEGATIVE

## 2016-10-13 LAB — APTT: aPTT: 43 seconds — ABNORMAL HIGH (ref 24–36)

## 2016-10-13 LAB — BASIC METABOLIC PANEL
Anion gap: 7 (ref 5–15)
BUN: <5 mg/dL — ABNORMAL LOW (ref 6–20)
CALCIUM: 8.3 mg/dL — AB (ref 8.9–10.3)
CO2: 25 mmol/L (ref 22–32)
CREATININE: 0.48 mg/dL (ref 0.44–1.00)
Chloride: 100 mmol/L — ABNORMAL LOW (ref 101–111)
GFR calc Af Amer: >60 mL/min (ref >60)
GLUCOSE: 97 mg/dL (ref 65–99)
Potassium: 3.9 mmol/L (ref 3.5–5.1)
Sodium: 132 mmol/L — ABNORMAL LOW (ref 135–145)

## 2016-10-13 LAB — CBC
HEMATOCRIT: 26.3 % — AB (ref 36.0–46.0)
Hemoglobin: 8.3 g/dL — ABNORMAL LOW (ref 12.0–15.0)
MCH: 26.3 pg (ref 26.0–34.0)
MCHC: 31.6 g/dL (ref 30.0–36.0)
MCV: 83.2 fL (ref 78.0–100.0)
PLATELETS: 415 10*3/uL — AB (ref 150–400)
RBC: 3.16 MIL/uL — ABNORMAL LOW (ref 3.87–5.11)
RDW: 15.5 % (ref 11.5–15.5)
WBC: 4.4 10*3/uL (ref 4.0–10.5)

## 2016-10-13 LAB — MAGNESIUM: MAGNESIUM: 1.9 mg/dL (ref 1.7–2.4)

## 2016-10-13 LAB — PROTIME-INR
INR: 1.67
Prothrombin Time: 19.9 seconds — ABNORMAL HIGH (ref 11.4–15.2)

## 2016-10-13 LAB — ABO/RH: ABO/RH(D): A POS

## 2016-10-13 SURGERY — LAPAROSCOPY, DIAGNOSTIC
Anesthesia: General | Site: Abdomen

## 2016-10-13 MED ORDER — OXYCODONE HCL 5 MG PO TABS: 5.0000 mg | ORAL_TABLET | Freq: Once | ORAL | Status: DC | PRN

## 2016-10-13 MED ORDER — CIPROFLOXACIN IN D5W 400 MG/200ML IV SOLN
INTRAVENOUS | Status: DC | PRN
  Administered 2016-10-13: 400 mg via INTRAVENOUS

## 2016-10-13 MED ORDER — SUGAMMADEX SODIUM 200 MG/2ML IV SOLN
INTRAVENOUS | Status: DC | PRN
  Administered 2016-10-13: 120 mg via INTRAVENOUS

## 2016-10-13 MED ORDER — PHENYLEPHRINE 40 MCG/ML (10ML) SYRINGE FOR IV PUSH (FOR BLOOD PRESSURE SUPPORT)
PREFILLED_SYRINGE | INTRAVENOUS | Status: AC
  Filled 2016-10-13: qty 10

## 2016-10-13 MED ORDER — HYDROMORPHONE HCL 2 MG/ML IJ SOLN
INTRAMUSCULAR | Status: AC
  Filled 2016-10-13: qty 1

## 2016-10-13 MED ORDER — ONDANSETRON HCL 4 MG/2ML IJ SOLN
INTRAMUSCULAR | Status: AC
Start: 2016-10-13 — End: 2016-10-13
  Filled 2016-10-13: qty 2

## 2016-10-13 MED ORDER — HYDROMORPHONE HCL 1 MG/ML IJ SOLN
INTRAMUSCULAR | Status: DC | PRN
  Administered 2016-10-13 (×4): 0.5 mg via INTRAVENOUS

## 2016-10-13 MED ORDER — MIDAZOLAM HCL 2 MG/2ML IJ SOLN
INTRAMUSCULAR | Status: AC
  Filled 2016-10-13: qty 2

## 2016-10-13 MED ORDER — MIDAZOLAM HCL 5 MG/5ML IJ SOLN
INTRAMUSCULAR | Status: DC | PRN
  Administered 2016-10-13: 2 mg via INTRAVENOUS

## 2016-10-13 MED ORDER — HYDROMORPHONE HCL 1 MG/ML IJ SOLN
INTRAMUSCULAR | Status: AC
  Administered 2016-10-13: 0.5 mg via INTRAVENOUS
  Filled 2016-10-13: qty 2

## 2016-10-13 MED ORDER — METRONIDAZOLE IN NACL 5-0.79 MG/ML-% IV SOLN
500.0000 mg | INTRAVENOUS | Status: AC
  Administered 2016-10-13: 500 mg via INTRAVENOUS
  Filled 2016-10-13 (×2): qty 100

## 2016-10-13 MED ORDER — FENTANYL CITRATE (PF) 100 MCG/2ML IJ SOLN
INTRAMUSCULAR | Status: DC | PRN
  Administered 2016-10-13 (×2): 50 ug via INTRAVENOUS
  Administered 2016-10-13: 100 ug via INTRAVENOUS

## 2016-10-13 MED ORDER — BUPIVACAINE-EPINEPHRINE (PF) 0.5% -1:200000 IJ SOLN
INTRAMUSCULAR | Status: AC
  Filled 2016-10-13: qty 30

## 2016-10-13 MED ORDER — ONDANSETRON HCL 4 MG/2ML IJ SOLN
INTRAMUSCULAR | Status: DC | PRN
  Administered 2016-10-13: 4 mg via INTRAVENOUS

## 2016-10-13 MED ORDER — ROCURONIUM BROMIDE 10 MG/ML (PF) SYRINGE
PREFILLED_SYRINGE | INTRAVENOUS | Status: DC | PRN
  Administered 2016-10-13 (×2): 10 mg via INTRAVENOUS
  Administered 2016-10-13: 5 mg via INTRAVENOUS
  Administered 2016-10-13: 35 mg via INTRAVENOUS

## 2016-10-13 MED ORDER — SUCCINYLCHOLINE CHLORIDE 200 MG/10ML IV SOSY
PREFILLED_SYRINGE | INTRAVENOUS | Status: AC
  Filled 2016-10-13: qty 10

## 2016-10-13 MED ORDER — OXYCODONE HCL 5 MG/5ML PO SOLN
5.0000 mg | Freq: Once | ORAL | Status: DC | PRN
  Filled 2016-10-13: qty 5

## 2016-10-13 MED ORDER — LIDOCAINE 2% (20 MG/ML) 5 ML SYRINGE
INTRAMUSCULAR | Status: DC | PRN
  Administered 2016-10-13: 50 mg via INTRAVENOUS

## 2016-10-13 MED ORDER — LACTATED RINGERS IV SOLN: INTRAVENOUS | Status: DC

## 2016-10-13 MED ORDER — ALBUMIN HUMAN 5 % IV SOLN
INTRAVENOUS | Status: AC
  Filled 2016-10-13: qty 250

## 2016-10-13 MED ORDER — HYDROMORPHONE HCL 1 MG/ML IJ SOLN
0.2500 mg | INTRAMUSCULAR | Status: DC | PRN
  Administered 2016-10-13 (×4): 0.5 mg via INTRAVENOUS

## 2016-10-13 MED ORDER — LACTATED RINGERS IV SOLN
INTRAVENOUS | Status: DC | PRN
  Administered 2016-10-13 (×2): via INTRAVENOUS

## 2016-10-13 MED ORDER — PROPOFOL 10 MG/ML IV BOLUS
INTRAVENOUS | Status: DC | PRN
  Administered 2016-10-13: 120 mg via INTRAVENOUS

## 2016-10-13 MED ORDER — 0.9 % SODIUM CHLORIDE (POUR BTL) OPTIME
TOPICAL | Status: DC | PRN
  Administered 2016-10-13: 1000 mL

## 2016-10-13 MED ORDER — DEXAMETHASONE SODIUM PHOSPHATE 10 MG/ML IJ SOLN
INTRAMUSCULAR | Status: AC
  Filled 2016-10-13: qty 1

## 2016-10-13 MED ORDER — FENTANYL CITRATE (PF) 250 MCG/5ML IJ SOLN
INTRAMUSCULAR | Status: AC
  Filled 2016-10-13: qty 5

## 2016-10-13 MED ORDER — BUPIVACAINE HCL (PF) 0.25 % IJ SOLN
INTRAMUSCULAR | Status: AC
  Filled 2016-10-13: qty 30

## 2016-10-13 MED ORDER — ONDANSETRON HCL 4 MG/2ML IJ SOLN: 4.0000 mg | Freq: Four times a day (QID) | INTRAMUSCULAR | Status: DC | PRN

## 2016-10-13 MED ORDER — CIPROFLOXACIN IN D5W 400 MG/200ML IV SOLN
400.0000 mg | INTRAVENOUS | Status: AC
  Administered 2016-10-13: 400 mg via INTRAVENOUS
  Filled 2016-10-13 (×2): qty 200

## 2016-10-13 MED ORDER — SUCCINYLCHOLINE CHLORIDE 200 MG/10ML IV SOSY
PREFILLED_SYRINGE | INTRAVENOUS | Status: DC | PRN
  Administered 2016-10-13: 100 mg via INTRAVENOUS

## 2016-10-13 MED ORDER — METRONIDAZOLE IN NACL 5-0.79 MG/ML-% IV SOLN
INTRAVENOUS | Status: DC | PRN
  Administered 2016-10-13: 500 mg via INTRAVENOUS

## 2016-10-13 SURGICAL SUPPLY — 62 items
APPLIER CLIP 5 13 M/L LIGAMAX5 (MISCELLANEOUS)
APPLIER CLIP ROT 10 11.4 M/L (STAPLE)
BLADE EXTENDED COATED 6.5IN (ELECTRODE) ×3 IMPLANT
BLADE SURG SZ10 CARB STEEL (BLADE) ×3 IMPLANT
CATH SILICONE 14FRX5CC (CATHETERS) ×6 IMPLANT
CELLS DAT CNTRL 66122 CELL SVR (MISCELLANEOUS) IMPLANT
CHLORAPREP W/TINT 26ML (MISCELLANEOUS) ×3 IMPLANT
CLIP APPLIE 5 13 M/L LIGAMAX5 (MISCELLANEOUS) IMPLANT
CLIP APPLIE ROT 10 11.4 M/L (STAPLE) IMPLANT
CLOSURE WOUND 1/2 X4 (GAUZE/BANDAGES/DRESSINGS)
COVER MAYO STAND STRL (DRAPES) ×3 IMPLANT
COVER SURGICAL LIGHT HANDLE (MISCELLANEOUS) ×3 IMPLANT
DECANTER SPIKE VIAL GLASS SM (MISCELLANEOUS) ×3 IMPLANT
DERMABOND ADVANCED (GAUZE/BANDAGES/DRESSINGS)
DERMABOND ADVANCED .7 DNX12 (GAUZE/BANDAGES/DRESSINGS) IMPLANT
DRAPE SHEET LG 3/4 BI-LAMINATE (DRAPES) IMPLANT
DRAPE WARM FLUID 44X44 (DRAPE) ×3 IMPLANT
ELECT PENCIL ROCKER SW 15FT (MISCELLANEOUS) IMPLANT
ELECT REM PT RETURN 15FT ADLT (MISCELLANEOUS) ×3 IMPLANT
GAUZE SPONGE 4X4 12PLY STRL (GAUZE/BANDAGES/DRESSINGS) IMPLANT
GLOVE BIOGEL M STRL SZ7.5 (GLOVE) ×3 IMPLANT
GLOVE INDICATOR 8.0 STRL GRN (GLOVE) ×3 IMPLANT
GOWN STRL REUS W/TWL XL LVL3 (GOWN DISPOSABLE) ×12 IMPLANT
HANDLE SUCTION POOLE (INSTRUMENTS) IMPLANT
IRRIG SUCT STRYKERFLOW 2 WTIP (MISCELLANEOUS)
IRRIGATION SUCT STRKRFLW 2 WTP (MISCELLANEOUS) IMPLANT
KIT BASIN OR (CUSTOM PROCEDURE TRAY) ×3 IMPLANT
LEGGING LITHOTOMY PAIR STRL (DRAPES) IMPLANT
RTRCTR WOUND ALEXIS 18CM MED (MISCELLANEOUS)
SCISSORS LAP 5X35 DISP (ENDOMECHANICALS) ×3 IMPLANT
SEALER TISSUE X1 CVD JAW (INSTRUMENTS) IMPLANT
SHEARS HARMONIC ACE PLUS 36CM (ENDOMECHANICALS) ×3 IMPLANT
SLEEVE XCEL OPT CAN 5 100 (ENDOMECHANICALS) ×3 IMPLANT
SPONGE LAP 18X18 X RAY DECT (DISPOSABLE) IMPLANT
STAPLER VISISTAT 35W (STAPLE) IMPLANT
STRIP CLOSURE SKIN 1/2X4 (GAUZE/BANDAGES/DRESSINGS) IMPLANT
SUCTION POOLE HANDLE (INSTRUMENTS)
SUT NOVA NAB GS-21 0 18 T12 DT (SUTURE) ×6 IMPLANT
SUT NYLON 3 0 (SUTURE) ×6 IMPLANT
SUT PDS AB 1 TP1 96 (SUTURE) IMPLANT
SUT PROLENE 2 0 KS (SUTURE) IMPLANT
SUT PROLENE 2 0 SH DA (SUTURE) IMPLANT
SUT SILK 2 0 (SUTURE) ×2
SUT SILK 2 0 SH CR/8 (SUTURE) ×3 IMPLANT
SUT SILK 2-0 18XBRD TIE 12 (SUTURE) ×1 IMPLANT
SUT SILK 3 0 (SUTURE) ×2
SUT SILK 3 0 SH CR/8 (SUTURE) ×3 IMPLANT
SUT SILK 3-0 18XBRD TIE 12 (SUTURE) ×1 IMPLANT
SUT VIC AB 3-0 SH 18 (SUTURE) ×6 IMPLANT
SUT VICRYL 0 UR6 27IN ABS (SUTURE) ×3 IMPLANT
SYR BULB IRRIGATION 50ML (SYRINGE) IMPLANT
SYS LAPSCP GELPORT 120MM (MISCELLANEOUS)
SYSTEM LAPSCP GELPORT 120MM (MISCELLANEOUS) IMPLANT
TOWEL OR 17X26 10 PK STRL BLUE (TOWEL DISPOSABLE) ×6 IMPLANT
TOWEL OR NON WOVEN STRL DISP B (DISPOSABLE) ×3 IMPLANT
TRAY FOLEY BAG SILVER LF 16FR (SET/KITS/TRAYS/PACK) ×3 IMPLANT
TRAY FOLEY W/METER SILVER 16FR (SET/KITS/TRAYS/PACK) IMPLANT
TRAY LAPAROSCOPIC (CUSTOM PROCEDURE TRAY) ×3 IMPLANT
TROCAR BLADELESS OPT 5 100 (ENDOMECHANICALS) ×3 IMPLANT
TROCAR XCEL NON-BLD 11X100MML (ENDOMECHANICALS) IMPLANT
YANKAUER SUCT BULB TIP 10FT TU (MISCELLANEOUS) IMPLANT
YANKAUER SUCT BULB TIP NO VENT (SUCTIONS) ×3 IMPLANT

## 2016-10-13 NOTE — Consult Note (Signed)
[facility] Nurse ostomy consult note  [place] Nurse requested for preoperative stoma site marking by Dr. Redmond Pulling.  Discussed surgical procedure and stoma creation with patient and family yesterday. Patient's fiance is not here this morning, patient has no questions. She is aware this is a palliative ostomy that will hopefully allow her to eat.  She will need to be independent in emptying as her fiance will not be at home much during the day with her.  He will however, be able to assist with pouch changes twice or three times weekly.  Examined patient lying in bed.  She has just been medicated for pain.  The abdomen is very distended and all landmarks are altered by this.  She wears her undergarments very low and the waistband of her slacks quite high, so a site is selected in between those two. I attempt to place the marking in the patient's visual field, away from any creases or abdominal contour issues and within the rectus muscle.    Marked for colostomy in the LLQ  6.5cm to the left of the umbilicus and 5cm above/below the umbilicus.  Patient's abdomen cleansed with CHG wipes at site markings, allowed to air dry prior to marking. Covered mark with thin film transparent dressing to preserve mark until case.   [place] nursing team will follow, and will remain available to this patient, the nursing, surgical and medical teams.   Thanks, Maudie Flakes, MSN, RN, [place], Arther Abbott  Pager# [phone]

## 2016-10-13 NOTE — Progress Notes (Signed)
  Subjective: No real change, we have had her marked for the colostomy and she is ready to proceed.    Objective: Vital signs in last 24 hours: Temp:  [98.9 F (37.2 C)-99.3 F (37.4 C)] 99.2 F (37.3 C) (03/22 0446) Pulse Rate:  [105-114] 105 (03/22 0446) Resp:  [13-17] 13 (03/22 0446) BP: (127-132)/(87-92) 128/87 (03/22 0446) SpO2:  [97 %-98 %] 98 % (03/22 0446) Last BM Date: 09/28/16 PO 1000 IV 825 Urine x 2 No BM Afebrile, VSS Labs OK Film:  Stable colonic distention with a air with air-fluid levels similar to that seen on prior plain film as well as a CT from 10/09/2016 consistent with the given clinical history.  Intake/Output from previous day: 03/21 0701 - 03/22 0700 In: 1825 [P.O.:1000; I.V.:825] Out: -  Intake/Output this shift: No intake/output data recorded.  General appearance: alert, cooperative and having pain but just got something for pain. GI: distended and no real change.  no BM  Lab Results:   Recent Labs  10/11/16 0525 10/13/16 0431  WBC 6.3 4.4  HGB 8.7* 8.3*  HCT 27.0* 26.3*  PLT 372 415*    BMET  Recent Labs  10/12/16 0536 10/13/16 0431  NA 133* 132*  K 3.8 3.9  CL 100* 100*  CO2 23 25  GLUCOSE 72 97  BUN <5* <5*  CREATININE 0.52 0.48  CALCIUM 8.2* 8.3*   PT/INR  Recent Labs  10/13/16 0431  LABPROT 19.9*  INR 1.67     Recent Labs Lab 10/09/16 1628  AST 24  ALT 19  ALKPHOS 116  BILITOT 0.6  PROT 6.8  ALBUMIN 2.6*     Lipase     Component Value Date/Time   LIPASE <10 (L) 10/09/2016 1628     Studies/Results: Dg Abd 2 Views  Result Date: 10/13/2016 CLINICAL DATA:  Colon carcinoma with abdominal pain EXAM: ABDOMEN - 2 VIEW COMPARISON:  10/11/2016 FINDINGS: Scattered large and small bowel gas is noted similar to that seen on the prior exam. Air-fluid levels are noted. No free air is seen. The overall appearance is similar to that noted on the prior exam. No bony abnormality is noted. IMPRESSION: Stable  colonic distention with a air with air-fluid levels similar to that seen on prior plain film as well as a CT from 10/09/2016 consistent with the given clinical history. Electronically Signed   By: [name] M.D.   On: 10/13/2016 09:19    Medications: . ciprofloxacin  400 mg Intravenous On Call to OR  . docusate sodium  100 mg Oral BID  . enoxaparin (LOVENOX) injection  40 mg Subcutaneous QHS  . metronidazole  500 mg Intravenous On Call to OR  . oxyCODONE  10 mg Oral Q4H    Assessment/Plan Stage IV colon cancer with carcinomatosis Nausea/constipation Moderate protein calorie malnutrition Anemia of chronic disease FEN: IV fluids/clear liquids ID: No abx DVT: Lovenox    Plan:  Surgery later this AM. I was called back by the nurse because the patient reported she did not know why she was going to the OR.  Her POA, and fiance was there and we repeated the plan to both, and I answered all their questions.  He said that is what they discussed yesterday and he was agreeable, and she seemed to be comfortable now with the plan and permit. Her POA/fiance,  signed the permit.     LOS: 3 days    Cayetano Mikita 10/13/2016 [phone]

## 2016-10-13 NOTE — Transfer of Care (Signed)
Immediate Anesthesia Transfer of Care Note  Patient: [name]  Procedure(s) Performed: Procedure(s): left upper quadrant LOOP COLOSTOMY (N/A)  Patient Location: PACU  Anesthesia Type:General  Level of Consciousness:  sedated, patient cooperative and responds to stimulation  Airway & Oxygen Therapy:Patient Spontanous Breathing and Patient connected to face mask oxgen  Post-op Assessment:  Report given to PACU RN and Post -op Vital signs reviewed and stable  Post vital signs:  Reviewed and stable  Last Vitals:  Vitals:   10/13/16 0446 10/13/16 1653  BP: 128/87 (!) 153/107  Pulse: (!) 105 (!) 107  Resp: 13 13  Temp: 37.3 C 83.6 C    Complications: No apparent anesthesia complications

## 2016-10-13 NOTE — Anesthesia Preprocedure Evaluation (Signed)
Anesthesia Evaluation  Patient identified by MRN, date of birth, ID band Patient awake    Reviewed: Allergy & Precautions, NPO status , Patient's Chart, lab work & pertinent test results  Airway Mallampati: II   Neck ROM: full    Dental   Pulmonary neg pulmonary ROS,    breath sounds clear to auscultation       Cardiovascular negative cardio ROS   Rhythm:regular Rate:Normal     Neuro/Psych PSYCHIATRIC DISORDERS Anxiety Depression    GI/Hepatic GERD  ,h/o colon CA s/p colectomy. s/p appy.  Peritoneal carcinomatosis. Ascites. h/o SBO   Endo/Other    Renal/GU      Musculoskeletal  (+) Fibromyalgia -  Abdominal   Peds  Hematology   Anesthesia Other Findings   Reproductive/Obstetrics                             Anesthesia Physical Anesthesia Plan  ASA: III  Anesthesia Plan: General   Post-op Pain Management:    Induction: Intravenous  Airway Management Planned: Oral ETT  Additional Equipment:   Intra-op Plan:   Post-operative Plan: Extubation in OR  Informed Consent: I have reviewed the patients History and Physical, chart, labs and discussed the procedure including the risks, benefits and alternatives for the proposed anesthesia with the patient or authorized representative who has indicated his/her understanding and acceptance.     Plan Discussed with: CRNA, Anesthesiologist and Surgeon  Anesthesia Plan Comments:         Anesthesia Quick Evaluation

## 2016-10-13 NOTE — Brief Op Note (Signed)
10/09/2016 - 10/13/2016  4:47 PM  PATIENT:  [name]  51 y.o. female  PRE-OPERATIVE DIAGNOSIS:  malignant sigmoid colon obstruction secondary to carcinomatosis  POST-OPERATIVE DIAGNOSIS:  same  PROCEDURE:  Procedure(s): TRANSVERSE COLON LOOP COLOSTOMY (N/A)  SURGEON:  Surgeon(s) and Role:    * Greer Pickerel, MD - Primary    * Jackolyn Confer, MD - Assisting  PHYSICIAN ASSISTANT:   ASSISTANTS: see above   ANESTHESIA:   general  EBL:  Total I/O In: 1000 [I.V.:1000] Out: 425 [Urine:400; Blood:25]  BLOOD ADMINISTERED:none  DRAINS: ostomy bridge   LOCAL MEDICATIONS USED:  NONE  SPECIMEN:  No Specimen  DISPOSITION OF SPECIMEN:  N/A  COUNTS:  YES  TOURNIQUET:  * No tourniquets in log *  DICTATION: .Other Dictation: Dictation Number [id number]  PLAN OF CARE: pacu then back to room  PATIENT DISPOSITION:  PACU - hemodynamically stable.   Delay start of Pharmacological VTE agent (>24hrs) due to surgical blood loss or risk of bleeding: no  Leighton Ruff. Redmond Pulling, MD, FACS General, Bariatric, & Minimally Invasive Surgery [facility], [place]

## 2016-10-13 NOTE — Anesthesia Procedure Notes (Signed)
Procedure Name: Intubation Date/Time: 10/13/2016 3:17 PM Performed by: Anne Fu Pre-anesthesia Checklist: Patient identified, Emergency Drugs available, Suction available, Patient being monitored and Timeout performed Patient Re-evaluated:Patient Re-evaluated prior to inductionOxygen Delivery Method: Circle system utilized Preoxygenation: Pre-oxygenation with 100% oxygen Intubation Type: IV induction Ventilation: Mask ventilation without difficulty Laryngoscope Size: Mac and 3 Grade View: Grade I Tube type: Oral Tube size: 7.5 mm Number of attempts: 1 Airway Equipment and Method: Stylet and Video-laryngoscopy Placement Confirmation: ETT inserted through vocal cords under direct vision,  positive ETCO2,  CO2 detector and breath sounds checked- equal and bilateral Secured at: 19 cm Tube secured with: Tape Dental Injury: Teeth and Oropharynx as per pre-operative assessment  Difficulty Due To: Difficult Airway- due to anterior larynx Future Recommendations: Recommend- induction with short-acting agent, and alternative techniques readily available Comments: DL X 1 with MAC 3 noted anterior larynx with limited mouth opening.  Converted to glidescopy with MAC 3 X 1 with GRADE 1 view.

## 2016-10-13 NOTE — Op Note (Signed)
[name], [name]  MEDICAL RECORD NO.:  [id number]  LOCATION:  7616                         FACILITY:  [facility]  PHYSICIAN:  Leighton Ruff. 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  PROCEDURE:  Transverse loop colostomy.  SURGEON:  Gayland Curry, MD, FACS.  ASSISTANT SURGEON:  Odis Hollingshead, M.D. (An assistant surgeon was required due to the fact that the patient has had extensive prior abdominal surgery, has diffuse carcinomatosis with malignant ascites and assistant was needed to help with manipulation of tissues).  ANESTHESIA:  General.  ESTIMATED BLOOD LOSS:  Less than 25 mL.  SPECIMEN:  None.  INDICATIONS FOR PROCEDURE:  The patient is a 51 year old female who presented in the fall of September 2016 with ruptured appendicitis and underwent percutaneous drainage, She was ultimately taken to the operating room in January 2017 for interval appendectomy and was converted to an ileocecectomy when defect was found in her cecum. 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  Her disease has progressed since then.  She has been on chemotherapy since the fall of 2017.  She has known peritoneal carcinomatosis with peritoneal masses throughout the perihepatic space, left upper quadrant, mesenteric and pelvic peritoneum around the spleen as well as multiple pulmonary nodules.  She was admitted with 10 days of obstipation. Imaging demonstrated distended small bowel along with distended proximal colon with several areas that were concerning for stricture in the sigmoid colon due to peritoneal implants.  Contrast had reached the colon.  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  She underwent extensive consultation with Oncology, Gastroenterology and Radiology regarding potential noninvasive procedures to help with her distal colonic obstruction.  GI felt that she had multiple areas of stricturing, which the CT suggested, therefore, a colonic stent would not be feasible.  Please see chart for additional details regarding that several-day discussion.  Ultimately, I offered her a loop colostomy with possible attempts at placement of a G- tube in her excluded stomach.  I explained to her and her fiance that I was concerned that she was at high risk for progressive disease with an impending small bowel obstruction and would recommend G-tube placement for decompression.  However, given the fact that she has had a prior gastric bypass as well as known carcinomatosis, I was not optimistic that the stomach would be mobile enough in order to reach the abdominal wall.  Moreover, we had a long discussion regarding the potential issues with a loop colostomy in the setting of carcinomatosis such as colostomy ischemia, retraction, parastomal hernia pouch tissues, and skin issues. We have discussed extensively this is a palliative procedure so that she would hopefully regain bowel function and be able to eat.  Please see chart for additional details.  DESCRIPTION OF PROCEDURE:  After obtaining informed consent, she was taken to the OR of [facility] and placed supine on the  operating table.  General endotracheal anesthesia was established. Sequential compression devices were placed.  A Foley catheter was placed.  Her right arm was tucked with the appropriate padding.  She had palpable tumor in her upper midline, which was confirmed on CT imaging. She had an old wound in the mini midline incision area from secondary healing.  After re-reviewing the CT and with discussion with Dr. Zella Richer, I felt the best plan  of access to get to the loop colostomy would be in the left upper quadrant.  I was concerned about tethering of the proximal transverse colon in the right upper quadrant due to the peritoneal implants encasing the liver.  A transverse incision was made about two and a half fingerbreadths below the left subcostal margin. This was slightly above where the patient had been marked by the wound ostomy nurse.  However, this was the maximal area of distention and this correlated to the left portion of her transverse colon.  Skin was incised.  Subcutaneous tissue was divided.  Anterior fascia was incised with electrocautery.  The rectus muscles were then divided by lifting up with a Claiborne Billings and bovieing through them.  The posterior sheath was carefully incised and we put Kocher's on it and gained access to the abdomen.  There was frank mucin that came out.  We opened it up further. Initially, there are dilated loops of bowel.  This initially appeared to be colonic.  This actually came out of the abdomen onto itself. Unfortunately, this was not colon, it was small bowel.  In fact, this was her jejunal jejunostomy.  It was grossly dilated in all directions, the biliary pancreatic limb, the Roux limb as well as the common channel.  It was unclear which limb was which.  There was peritoneal implants on the omentum as well as on the abdominal wall.  We were eventually able to reduce the jejunojejunostomy back into the abdominal cavity.  Slightly superior to this, we found what appeared to be colon was.  It was grasped with a Babcock and lifted up and delivered.  It was distended.  However, we were able to create a small window in the mesentery just at the high line of the colonic wall and passed a non latex Penrose drain.  We tried to mobilize as much of the transverse colon as we could up out of the abdominal cavity, but it was still tethered, but we achieved sufficient mobilization in order to create  a loop colostomy.  I did decide to reapproximate some of the anterior fascia lateral and medial to try to prevent herniation of intraabdominal contents.  This was done with 3 interrupted 0 Novafil sutures towards the midline and then 2 sutures out laterally.  We then made our transverse colotomy, opened up the colon for about 60% of its circumference.  There was a rush of gas.  We did try to decompress the limbs.  The proximal limb was going back towards the patient's right. The distal limb was going down and up towards the patient's left shoulder.  We then used 3 interrupted 3-0 silks to secure a portion of the lateral part of the wall of the colostomy to the anterior fascia superiorly and 2 interrupted 3-0 silk sutures inferiorly to the fascia to prevent any parastomal herniation of small bowel.  The colostomy was then matured to the deep dermis and skin with multiple interrupted 3-0 Vicryl sutures.  The Penrose had been previously removed and a 14-French Foley catheter that  was non latex was trimmed and used as an ostomy bridge.  It was secured to each side of the skin with a 3-0 nylon suture.  An ostomy device was placed.  The patient was extubated and taken to recovery room in stable condition.  All needle, instrument, and sponge counts were correct x2.  There were no immediate complications. The patient tolerated procedure well.     Leighton Ruff. Redmond Pulling, MD, FACS     EMW/MEDQ  D:  10/13/2016  T:  10/13/2016  Job:  [id number]

## 2016-10-13 NOTE — Progress Notes (Signed)
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 The patient states that she has not had a bowel movement in 10 days. She tried over-the-counter laxatives including magnesium citrate x 5without results. She has had decreased oral intake and increasing fatigue over the past week. The patient was seen at the [facility] center on 10/04/2016 with worsening abdominal pain. At that time, the patient's oxycodone was increased to 10 mg every 4 hours when necessary pain and CT of the abdomen and pelvis was ordered.   Regarding her oncologic history, the patient was diagnosed with colon cancer and underwent right hemicolectomy on 08/03/2015. She was initiated on chemotherapy on 09/18/2015. PET scan imaging on 05/20/2016 revealed hypermetabolic peritoneal carcinomatosis with 5 scattered pulmonary nodules with possible hypermetabolic mass in the right ventral pelvic wall.  Patient was monitored underwent conservative treatment have is no significant improvement with small bowel obstruction. Serial abdominal films were done with no significant improvement. Patient was followed by general surgery and patient for probable surgical intervention 10/13/2016.    Assessment & Plan:   Principal Problem:   Abdominal pain Active Problems:   Cancer of right colon (HCC)   Hyponatremia   Hypoalbuminemia due to protein-calorie malnutrition (HCC)   UTI (urinary tract infection)   Hyperglycemia   Uncontrolled pain   Peritoneal carcinomatosis (HCC)   Malignant ascites   Malignant neoplasm of colon (HCC)   SBO (small bowel obstruction)   Intractable abdominal pain/uncontrolled pain -Secondary to peritoneal  carcinomatosis causing extrinsic compression -10/09/2016 CT abdomen and pelvis--progression of air-fluid levels with moderately distended colon. Several local areas of stricturing and wall thickening in the sigmoid colon likely representing a degree of partial obstruction. Small bowel loops with signal areas of wall thickening and edema. -appreciate general surgery-->enema with minimal success -appreciate MedOnc -consult [facility]-->?possible colonic stent however per GI no strong likelihood of success with stenting.  -Fluid levels similar to that on prior films as well as CT of 10/09/2016. Contrast has not progressed. Likely unable to do a Gastrografin enema. Patient for probable surgical intervention this afternoon per general surgery. -continue home dose oxycodone. 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 Burr Medico followup  Iron deficiency anemia -managed by Dr. Burr Medico  Failure to thrive  -consulted nutrition  Fibromyalgia and depression -Continue home dose alprazolam   DVT prophylaxis: Lovenox Code Status: Full. Family Communication: Updated patient. No family at bedside. Disposition Plan: Home when obstruction has resolved and per general surgery.   Consultants:   Gen. surgery: Dr. Redmond Pulling 10/10/2016  Gastroenterology: Dr. Paulita Fujita  Oncology: Dr Burr Medico  Procedures:   CT abdomen and pelvis 10/09/2016  Abdominal xray 10/11/2016,   CXR 10/09/2016  Antimicrobials:   None   Subjective: Patient denies any flatus. No bowel movement. Patient complaining of abdominal pain. Patient states IV morphine helping with pain.  Objective: Vitals:    10/12/16 0426 10/12/16 1448 10/12/16 2025 10/13/16 0446  BP: 126/89 127/87 (!) 132/92 128/87  Pulse: (!) 108 (!) 114 (!) 109 Marland Kitchen)  105  Resp: 16 17 16 13   Temp: 98.4 F (36.9 C) 99.3 F (37.4 C) 98.9 F (37.2 C) 99.2 F (37.3 C)  TempSrc: Oral Oral Oral Oral  SpO2: 99% 98% 97% 98%  Weight:      Height:        Intake/Output Summary (Last 24 hours) at 10/13/16 1110 Last data filed at 10/12/16 1910  Gross per 24 hour  Intake             1475 ml  Output                0 ml  Net             1475 ml   Filed Weights   10/09/16 1523 10/10/16 0153  Weight: 53.1 kg (117 lb) 56 kg (123 lb 7.3 oz)    Examination:  General exam: Appears calm and comfortable  Respiratory system: Clear to auscultation. Respiratory effort normal. Cardiovascular system: S1 & S2 heard, RRR. No JVD, murmurs, rubs, gallops or clicks. No pedal edema. Gastrointestinal system: Abdomen is distended, tight and some tenderness to palpation. No organomegaly or masses felt. Hyperactive bowel sounds. Central nervous system: Alert and oriented. No focal neurological deficits. Extremities: Symmetric 5 x 5 power. Skin: No rashes, lesions or ulcers Psychiatry: Judgement and insight appear normal. Mood & affect appropriate.     Data Reviewed: I have personally reviewed following labs and imaging studies  CBC:  Recent Labs Lab 10/09/16 1628 10/10/16 0502 10/11/16 0525 10/13/16 0431  WBC 6.4 5.7 6.3 4.4  NEUTROABS 4.2  --   --   --   HGB 9.1* 8.5* 8.7* 8.3*  HCT 28.5* 26.5* 27.0* 26.3*  MCV 82.6 82.8 84.4 83.2  PLT 364 335 372 762*   Basic Metabolic Panel:  Recent Labs Lab 10/09/16 1628 10/10/16 0502 10/11/16 0525 10/12/16 0536 10/13/16 0431  NA 129* 130* 133* 133* 132*  K 4.3 4.3 4.1 3.8 3.9  CL 92* 97* 99* 100* 100*  CO2 28 26 25 23 25   GLUCOSE 112* 103* 99 72 97  BUN 7 5* <5* <5* <5*  CREATININE 0.60 0.49 0.55 0.52 0.48  CALCIUM 9.0 8.4* 8.5* 8.2* 8.3*  MG  --   --  2.4 2.1 1.9    GFR: Estimated Creatinine Clearance: 64.5 mL/min (by C-G formula based on SCr of 0.48 mg/dL). Liver Function Tests:  Recent Labs Lab 10/09/16 1628  AST 24  ALT 19  ALKPHOS 116  BILITOT 0.6  PROT 6.8  ALBUMIN 2.6*    Recent Labs Lab 10/09/16 1628  LIPASE <10*   No results for input(s): AMMONIA in the last 168 hours. Coagulation Profile:  Recent Labs Lab 10/13/16 0431  INR 1.67   Cardiac Enzymes: No results for input(s): CKTOTAL, CKMB, CKMBINDEX, TROPONINI in the last 168 hours. BNP (last 3 results) No results for input(s): PROBNP in the last 8760 hours. HbA1C: No results for input(s): HGBA1C in the last 72 hours. CBG: No results for input(s): GLUCAP in the last 168 hours. Lipid Profile: No results for input(s): CHOL, HDL, LDLCALC, TRIG, CHOLHDL, LDLDIRECT in the last 72 hours. Thyroid Function Tests: No results for input(s): TSH, T4TOTAL, FREET4, T3FREE, THYROIDAB in the last 72 hours. Anemia Panel: No results for input(s): VITAMINB12, FOLATE, FERRITIN, TIBC, IRON, RETICCTPCT in the last 72 hours. Sepsis Labs:  Recent Labs Lab 10/09/16 2050  LATICACIDVEN 0.53    Recent Results (from the past 240 hour(s))  Urine Culture  Status: Abnormal   Collection Time: 10/04/16  3:19 PM  Result Value Ref Range Status   Urine Culture, Routine Final report (A)  Final   Urine Culture result 1 Comment (A)  Final    Comment: Beta hemolytic Streptococcus, group B 10,000-25,000 colony forming units per mL Penicillin and ampicillin are drugs of choice for treatment of beta-hemolytic streptococcal infections. Susceptibility testing of penicillins and other beta-lactam agents approved by the FDA for treatment of beta-hemolytic streptococcal infections need not be performed routinely because nonsusceptible isolates are extremely rare in any beta-hemolytic streptococcus and have not been reported for Streptococcus pyogenes (group A). (CLSI 2011)   Urine culture      Status: Abnormal   Collection Time: 10/09/16  4:29 PM  Result Value Ref Range Status   Specimen Description URINE, RANDOM  Final   Special Requests NONE  Final   Culture (A)  Final    <10,000 COLONIES/mL INSIGNIFICANT GROWTH Performed at [facility], [place] [address], [place], [place] [address]    Report Status 10/11/2016 FINAL  Final  Blood culture (routine x 2)     Status: None (Preliminary result)   Collection Time: 10/09/16  4:29 PM  Result Value Ref Range Status   Specimen Description BLOOD RIGHT ANTECUBITAL  Final   Special Requests BOTTLES DRAWN AEROBIC AND ANAEROBIC 5 CC  Final   Culture   Final    NO GROWTH 4 DAYS Performed at [facility], [place] [address] [place], [place] [address]    Report Status PENDING  Incomplete  Blood culture (routine x 2)     Status: None (Preliminary result)   Collection Time: 10/09/16  4:29 PM  Result Value Ref Range Status   Specimen Description BLOOD PORTA CATH  Final   Special Requests IN PEDIATRIC BOTTLE 2ML  Final   Culture   Final    NO GROWTH 4 DAYS Performed at [facility], [place] [address] [place], [place] [address]    Report Status PENDING  Incomplete         Radiology Studies: Dg Abd 2 Views  Result Date: 10/13/2016 CLINICAL DATA:  Colon carcinoma with abdominal pain EXAM: ABDOMEN - 2 VIEW COMPARISON:  10/11/2016 FINDINGS: Scattered large and small bowel gas is noted similar to that seen on the prior exam. Air-fluid levels are noted. No free air is seen. The overall appearance is similar to that noted on the prior exam. No bony abnormality is noted. IMPRESSION: Stable colonic distention with a air with air-fluid levels similar to that seen on prior plain film as well as a CT from 10/09/2016 consistent with the given clinical history. Electronically Signed   By: [name] M.D.   On: 10/13/2016 09:19        Scheduled Meds: . ciprofloxacin  400 mg Intravenous On Call to OR  . docusate sodium  100 mg Oral  BID  . enoxaparin (LOVENOX) injection  40 mg Subcutaneous QHS  . metronidazole  500 mg Intravenous On Call to OR  . oxyCODONE  10 mg Oral Q4H   Continuous Infusions: . sodium chloride 100 mL/hr at 10/12/16 1959     LOS: 3 days    Time spent: 64 mins    Jasiel Apachito, MD [facility] Pager [phone]  If 7PM-7AM, please contact night-coverage [email or web address] Password [email or web address] 10/13/2016, 11:10 AM

## 2016-10-14 LAB — CBC WITH DIFFERENTIAL/PLATELET
BASOS PCT: 0 %
Basophils Absolute: 0 10*3/uL (ref 0.0–0.1)
EOS ABS: 0 10*3/uL (ref 0.0–0.7)
Eosinophils Relative: 0 %
HCT: 26.6 % — ABNORMAL LOW (ref 36.0–46.0)
HEMOGLOBIN: 8.6 g/dL — AB (ref 12.0–15.0)
LYMPHS ABS: 0.8 10*3/uL (ref 0.7–4.0)
LYMPHS PCT: 18 %
MCH: 27.2 pg (ref 26.0–34.0)
MCHC: 32.3 g/dL (ref 30.0–36.0)
MCV: 84.2 fL (ref 78.0–100.0)
MONO ABS: 0.8 10*3/uL (ref 0.1–1.0)
Monocytes Relative: 17 %
NEUTROS ABS: 3.1 10*3/uL (ref 1.7–7.7)
Neutrophils Relative %: 65 %
Platelets: 441 10*3/uL — ABNORMAL HIGH (ref 150–400)
RBC: 3.16 MIL/uL — ABNORMAL LOW (ref 3.87–5.11)
RDW: 15.6 % — ABNORMAL HIGH (ref 11.5–15.5)
WBC: 4.7 10*3/uL (ref 4.0–10.5)

## 2016-10-14 LAB — CULTURE, BLOOD (ROUTINE X 2)
CULTURE: NO GROWTH
CULTURE: NO GROWTH

## 2016-10-14 LAB — COMPREHENSIVE METABOLIC PANEL
ALT: 15 U/L (ref 14–54)
ANION GAP: 9 (ref 5–15)
AST: 17 U/L (ref 15–41)
Albumin: 2 g/dL — ABNORMAL LOW (ref 3.5–5.0)
Alkaline Phosphatase: 103 U/L (ref 38–126)
BUN: <5 mg/dL — ABNORMAL LOW (ref 6–20)
CO2: 24 mmol/L (ref 22–32)
Calcium: 8.1 mg/dL — ABNORMAL LOW (ref 8.9–10.3)
Chloride: 102 mmol/L (ref 101–111)
Creatinine, Ser: 0.44 mg/dL (ref 0.44–1.00)
GFR calc non Af Amer: >60 mL/min (ref >60)
Glucose, Bld: 90 mg/dL (ref 65–99)
Potassium: 3.7 mmol/L (ref 3.5–5.1)
SODIUM: 135 mmol/L (ref 135–145)
TOTAL PROTEIN: 5.6 g/dL — AB (ref 6.5–8.1)
Total Bilirubin: 0.8 mg/dL (ref 0.3–1.2)

## 2016-10-14 LAB — MAGNESIUM: Magnesium: 1.9 mg/dL (ref 1.7–2.4)

## 2016-10-14 MED ORDER — PROMETHAZINE HCL 25 MG/ML IJ SOLN
12.5000 mg | Freq: Four times a day (QID) | INTRAMUSCULAR | Status: DC | PRN
  Administered 2016-10-14 – 2016-10-15 (×3): 12.5 mg via INTRAVENOUS
  Filled 2016-10-14 (×3): qty 1

## 2016-10-14 MED ORDER — ALUM & MAG HYDROXIDE-SIMETH 200-200-20 MG/5ML PO SUSP
15.0000 mL | ORAL | Status: DC | PRN
  Administered 2016-10-14: 15 mL via ORAL
  Filled 2016-10-14: qty 30

## 2016-10-14 MED ORDER — METHOCARBAMOL 1000 MG/10ML IJ SOLN
500.0000 mg | Freq: Three times a day (TID) | INTRAVENOUS | Status: DC
  Administered 2016-10-14 – 2016-10-20 (×16): 500 mg via INTRAVENOUS
  Filled 2016-10-14 (×7): qty 550
  Filled 2016-10-14: qty 5
  Filled 2016-10-14 (×6): qty 550
  Filled 2016-10-14 (×3): qty 5
  Filled 2016-10-14 (×2): qty 550
  Filled 2016-10-14: qty 5
  Filled 2016-10-14: qty 550

## 2016-10-14 MED ORDER — MORPHINE SULFATE (PF) 4 MG/ML IV SOLN
1.0000 mg | INTRAVENOUS | Status: DC | PRN
  Administered 2016-10-14 (×2): 4 mg via INTRAVENOUS
  Filled 2016-10-14 (×2): qty 1

## 2016-10-14 MED ORDER — HYDROMORPHONE HCL 1 MG/ML IJ SOLN
0.5000 mg | INTRAMUSCULAR | Status: DC | PRN
  Administered 2016-10-14: 1 mg via INTRAVENOUS
  Administered 2016-10-15: 0.5 mg via INTRAVENOUS
  Administered 2016-10-15: 2 mg via INTRAVENOUS
  Administered 2016-10-15 – 2016-10-19 (×5): 1 mg via INTRAVENOUS
  Administered 2016-10-20: 2 mg via INTRAVENOUS
  Filled 2016-10-14: qty 2
  Filled 2016-10-14 (×2): qty 1
  Filled 2016-10-14: qty 2
  Filled 2016-10-14: qty 1
  Filled 2016-10-14 (×2): qty 2
  Filled 2016-10-14 (×2): qty 1

## 2016-10-14 MED ORDER — ENOXAPARIN SODIUM 40 MG/0.4ML ~~LOC~~ SOLN
40.0000 mg | Freq: Every day | SUBCUTANEOUS | Status: DC
  Administered 2016-10-18: 40 mg via SUBCUTANEOUS
  Filled 2016-10-14 (×3): qty 0.4

## 2016-10-14 NOTE — Progress Notes (Signed)
PROGRESS NOTE    [name]  PZW:[id number] DOB: [birth year] DOA: 10/09/2016 PCP: Reginia Naas, MD   Brief Narrative:  51 year old female with a history of stage IIIB colon cancer with mets to lung and liver and peritoneal carcinomatosis currently on Folfiri and Avastin, anxiety/depression, hyponatremia presenting with worsening abdominal pain. The patient states that she has not had a bowel movement in 10 days. She tried over-the-counter laxatives including magnesium citrate x 5without results. She has had decreased oral intake and increasing fatigue over the past week. The patient was seen at the [facility] center on 10/04/2016 with worsening abdominal pain. At that time, the patient's oxycodone was increased to 10 mg every 4 hours when necessary pain and CT of the abdomen and pelvis was ordered.   Regarding her oncologic history, the patient was diagnosed with colon cancer and underwent right hemicolectomy on 08/03/2015. She was initiated on chemotherapy on 09/18/2015. PET scan imaging on 05/20/2016 revealed hypermetabolic peritoneal carcinomatosis with 5 scattered pulmonary nodules with possible hypermetabolic mass in the right ventral pelvic wall.  Patient was monitored underwent conservative treatment have is no significant improvement with small bowel obstruction. Serial abdominal films were done with no significant improvement. Patient was followed by general surgery and patient for probable surgical intervention 10/13/2016.    Assessment & Plan:   Principal Problem:   Abdominal pain Active Problems:   Cancer of right colon (HCC)   Hyponatremia   Hypoalbuminemia due to protein-calorie malnutrition (HCC)   UTI (urinary tract infection)   Hyperglycemia   Uncontrolled pain   Peritoneal carcinomatosis (HCC)   Malignant ascites   Malignant neoplasm of colon (HCC)   SBO (small bowel obstruction)   Intractable abdominal pain/uncontrolled pain -Secondary to peritoneal  carcinomatosis causing extrinsic compression -10/09/2016 CT abdomen and pelvis--progression of air-fluid levels with moderately distended colon. Several local areas of stricturing and wall thickening in the sigmoid colon likely representing a degree of partial obstruction. Small bowel loops with signal areas of wall thickening and edema. -appreciate general surgery-->enema with minimal success -appreciate MedOnc -consult [facility]-->?possible colonic stent however per GI no strong likelihood of success with stenting.  -Fluid levels similar to that on prior films as well as CT of 10/09/2016. Contrast has not progressed. Likely unable to do a Gastrografin enema. Patient s/p transverse loop colostomy per Dr. Redmond Pulling 10/13/2016.  -continue home dose oxycodone. -Continue IV morphine prn breakthrough pain - Per general surgery.  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 Burr Medico followup  Iron deficiency anemia -managed by Dr. Burr Medico. H&H stable.  Failure to thrive  -consulted nutrition  Fibromyalgia and depression -Continue home dose alprazolam   DVT prophylaxis: Lovenox Code Status: Full. Family Communication: Updated patient. No family at bedside. Disposition Plan: Home when obstruction has resolved and per general surgery.   Consultants:   Gen. surgery: Dr. Redmond Pulling 10/10/2016  Gastroenterology: Dr. Paulita Fujita  Oncology: Dr Burr Medico  Procedures:   CT abdomen and pelvis 10/09/2016  Abdominal xray 10/11/2016,   CXR 10/09/2016  Transverse loop colostomy per Dr. Redmond Pulling 10/13/2016  Antimicrobials:   None   Subjective: Patient states noted some gas and ostomy bag overnight. No stool noted in ostomy  bag. Abdominal pain currently managed on the current regimen.   Objective: Vitals:   10/13/16 1839 10/13/16 2005 10/13/16  2129 10/14/16 0527  BP: (!) 135/96 (!) 153/96 (!) 133/92 123/87  Pulse: (!) 115 (!) 123 (!) 127 (!) 110  Resp: 16 14 16 16   Temp: 98.4 F (36.9 C) 99.3 F (37.4 C) (!) 100.9 F (38.3 C) 98.6 F (37 C)  TempSrc: Oral Oral Oral Oral  SpO2: 99% 95% 99% 98%  Weight:      Height:        Intake/Output Summary (Last 24 hours) at 10/14/16 1240 Last data filed at 10/14/16 0528  Gross per 24 hour  Intake             1500 ml  Output             1775 ml  Net             -275 ml   Filed Weights   10/09/16 1523 10/10/16 0153  Weight: 53.1 kg (117 lb) 56 kg (123 lb 7.3 oz)    Examination:  General exam: Appears calm and comfortable  Respiratory system: Clear to auscultation. Respiratory effort normal. Cardiovascular system: S1 & S2 heard, RRR. No JVD, murmurs, rubs, gallops or clicks. No pedal edema. Gastrointestinal system: Abdomen is less distended, soft and some tenderness to palpation. No organomegaly or masses felt. Hyperactive bowel sounds.Ostomy bag intact. Central nervous system: Alert and oriented. No focal neurological deficits. Extremities: Symmetric 5 x 5 power. Skin: No rashes, lesions or ulcers Psychiatry: Judgement and insight appear normal. Mood & affect appropriate.     Data Reviewed: I have personally reviewed following labs and imaging studies  CBC:  Recent Labs Lab 10/09/16 1628 10/10/16 0502 10/11/16 0525 10/13/16 0431 10/14/16 0528  WBC 6.4 5.7 6.3 4.4 4.7  NEUTROABS 4.2  --   --   --  3.1  HGB 9.1* 8.5* 8.7* 8.3* 8.6*  HCT 28.5* 26.5* 27.0* 26.3* 26.6*  MCV 82.6 82.8 84.4 83.2 84.2  PLT 364 335 372 415* 536*   Basic Metabolic Panel:  Recent Labs Lab 10/10/16 0502 10/11/16 0525 10/12/16 0536 10/13/16 0431 10/14/16 0528  NA 130* 133* 133* 132* 135  K 4.3 4.1 3.8 3.9 3.7  CL 97* 99* 100* 100* 102  CO2 26 25 23 25 24     GLUCOSE 103* 99 72 97 90  BUN 5* <5* <5* <5* <5*  CREATININE 0.49 0.55 0.52 0.48 0.44  CALCIUM 8.4* 8.5* 8.2* 8.3* 8.1*  MG  --  2.4 2.1 1.9 1.9   GFR: Estimated Creatinine Clearance: 64.5 mL/min (by C-G formula based on SCr of 0.44 mg/dL). Liver Function Tests:  Recent Labs Lab 10/09/16 1628 10/14/16 0528  AST 24 17  ALT 19 15  ALKPHOS 116 103  BILITOT 0.6 0.8  PROT 6.8 5.6*  ALBUMIN 2.6* 2.0*    Recent Labs Lab 10/09/16 1628  LIPASE <10*   No results for input(s): AMMONIA in the last 168 hours. Coagulation Profile:  Recent Labs Lab 10/13/16 0431  INR 1.67   Cardiac Enzymes: No results for input(s): CKTOTAL, CKMB, CKMBINDEX, TROPONINI in the last 168 hours. BNP (last 3 results) No results for input(s): PROBNP in the last 8760 hours. HbA1C: No results for input(s): HGBA1C in the last 72 hours. CBG: No results for input(s): GLUCAP in the last 168 hours. Lipid Profile: No results for input(s): CHOL, HDL, LDLCALC, TRIG, CHOLHDL, LDLDIRECT in the last 72 hours. Thyroid Function Tests: No results for input(s): TSH, T4TOTAL, FREET4, T3FREE, THYROIDAB in the last 72 hours. Anemia Panel: No results for input(s): VITAMINB12, FOLATE,  FERRITIN, TIBC, IRON, RETICCTPCT in the last 72 hours. Sepsis Labs:  Recent Labs Lab 10/09/16 2050  LATICACIDVEN 0.53    Recent Results (from the past 240 hour(s))  Urine Culture     Status: Abnormal   Collection Time: 10/04/16  3:19 PM  Result Value Ref Range Status   Urine Culture, Routine Final report (A)  Final   Urine Culture result 1 Comment (A)  Final    Comment: Beta hemolytic Streptococcus, group B 10,000-25,000 colony forming units per mL Penicillin and ampicillin are drugs of choice for treatment of beta-hemolytic streptococcal infections. Susceptibility testing of penicillins and other beta-lactam agents approved by the FDA for treatment of beta-hemolytic streptococcal infections need not be performed routinely  because nonsusceptible isolates are extremely rare in any beta-hemolytic streptococcus and have not been reported for Streptococcus pyogenes (group A). (CLSI 2011)   Urine culture     Status: Abnormal   Collection Time: 10/09/16  4:29 PM  Result Value Ref Range Status   Specimen Description URINE, RANDOM  Final   Special Requests NONE  Final   Culture (A)  Final    <10,000 COLONIES/mL INSIGNIFICANT GROWTH Performed at [facility], [place] [address] [place], [place] [address]    Report Status 10/11/2016 FINAL  Final  Blood culture (routine x 2)     Status: None (Preliminary result)   Collection Time: 10/09/16  4:29 PM  Result Value Ref Range Status   Specimen Description BLOOD RIGHT ANTECUBITAL  Final   Special Requests BOTTLES DRAWN AEROBIC AND ANAEROBIC 5 CC  Final   Culture   Final    NO GROWTH 4 DAYS Performed at [facility], [place] [address], [place], [place] [address]    Report Status PENDING  Incomplete  Blood culture (routine x 2)     Status: None (Preliminary result)   Collection Time: 10/09/16  4:29 PM  Result Value Ref Range Status   Specimen Description BLOOD PORTA CATH  Final   Special Requests IN PEDIATRIC BOTTLE 2ML  Final   Culture   Final    NO GROWTH 4 DAYS Performed at [facility], [place] [address], [place], [place] [address]    Report Status PENDING  Incomplete  Surgical pcr screen     Status: None   Collection Time: 10/13/16 10:40 AM  Result Value Ref Range Status   MRSA, PCR NEGATIVE NEGATIVE Final   Staphylococcus aureus NEGATIVE NEGATIVE Final    Comment:        The Xpert SA Assay (FDA approved for NASAL specimens in patients over 77 years of age), is one component of a comprehensive surveillance program.  Test performance has been validated by [facility] for patients greater than or equal to 59 year old. It is not intended to diagnose infection nor to guide or monitor treatment.          Radiology Studies: Dg Abd 2  Views  Result Date: 10/13/2016 CLINICAL DATA:  Colon carcinoma with abdominal pain EXAM: ABDOMEN - 2 VIEW COMPARISON:  10/11/2016 FINDINGS: Scattered large and small bowel gas is noted similar to that seen on the prior exam. Air-fluid levels are noted. No free air is seen. The overall appearance is similar to that noted on the prior exam. No bony abnormality is noted. IMPRESSION: Stable colonic distention with a air with air-fluid levels similar to that seen on prior plain film as well as a CT from 10/09/2016 consistent with the given clinical history.  Electronically Signed   By: [name] M.D.   On: 10/13/2016 09:19        Scheduled Meds: . docusate sodium  100 mg Oral BID  . enoxaparin (LOVENOX) injection  40 mg Subcutaneous QHS  . methocarbamol (ROBAXIN)  IV  500 mg Intravenous Q8H   Continuous Infusions: . sodium chloride 100 mL/hr at 10/14/16 0903     LOS: 4 days    Time spent: 18 mins    Stormie Ventola, MD [facility] Pager [phone]  If 7PM-7AM, please contact night-coverage [email or web address] Password [facility] 10/14/2016, 12:40 PM

## 2016-10-14 NOTE — Consult Note (Signed)
[facility] Nurse ostomy consult note Stoma type/location: LUQ colostomy Stomal assessment/size: Difficult to visualize today; the elliptical opening has red mucosa within it, no budded stoma  Peristomal assessment: intact Treatment options for stomal/peristomal skin: skin barrier ring Output: small amount of tea-colored effluent in pouch.  Patient and Dr. Redmond Pulling report flatus today.   Ostomy pouching: 2pc. 2 and 3/4 inch with skin barrier ring. Spare pouch and pattern left in room; also a 1-piece pouching system in the event this is a better option next week. Education provided: None, patient is sleeping and has recently been medicated for pain.  [name] was here early this am and has left for the day. Enrolled patient in [place] program: No WOC nursing team will follow, and will remain available to this patient, the nursing, surgical and medical teams.   Thanks, Maudie Flakes, MSN, RN, [place], Arther Abbott  Pager# [phone]

## 2016-10-14 NOTE — Progress Notes (Signed)
Patient's fiance became very agitated/hostile towards RN when explaining purpose of a code when family call for information about patient's care. Charge RN made aware of situation. RN educated patient's fiance on HIPAA and why giving information without a code/patient's permission is a violation of that. Fiance began yelling at RN in patient's room. RN was able to calm the fiance down and further explained importance of following HIPAA. Patient was asked permission to give information to fiance and set up code; fiance apologized and became tearful to RN. Patient currently resting. Will continue to monitor for changes.

## 2016-10-14 NOTE — Progress Notes (Signed)
1 Day Post-Op  Subjective: She is still tender and having some discomfort, but feels better.  She is less distended.   Some air in the ostomy bag.  Objective: Vital signs in last 24 hours: Temp:  [98.4 F (36.9 C)-100.9 F (38.3 C)] 98.6 F (37 C) (03/23 0527) Pulse Rate:  [98-127] 110 (03/23 0527) Resp:  [13-16] 16 (03/23 0527) BP: (123-154)/(87-107) 123/87 (03/23 0527) SpO2:  [95 %-100 %] 98 % (03/23 0527) Last BM Date: 09/28/16 IV 1500 NPO Urine 1750 Tm 100, tachycardia with HR up to 127 last PM with fever , last     Intake/Output from previous day: 03/22 0701 - 03/23 0700 In: 1500 [I.V.:1500] Out: 1775 [Urine:1750; Blood:25] Intake/Output this shift: No intake/output data recorded.  General appearance: alert, cooperative and no distress GI: soft, still tender and sore.  Ostomy OK, not much in bag yet.       Lab Results:   Recent Labs  10/13/16 0431 10/14/16 0528  WBC 4.4 4.7  HGB 8.3* 8.6*  HCT 26.3* 26.6*  PLT 415* 441*    BMET  Recent Labs  10/13/16 0431 10/14/16 0528  NA 132* 135  K 3.9 3.7  CL 100* 102  CO2 25 24  GLUCOSE 97 90  BUN <5* <5*  CREATININE 0.48 0.44  CALCIUM 8.3* 8.1*   PT/INR  Recent Labs  10/13/16 0431  LABPROT 19.9*  INR 1.67     Recent Labs Lab 10/09/16 1628 10/14/16 0528  AST 24 17  ALT 19 15  ALKPHOS 116 103  BILITOT 0.6 0.8  PROT 6.8 5.6*  ALBUMIN 2.6* 2.0*     Lipase     Component Value Date/Time   LIPASE <10 (L) 10/09/2016 1628     Studies/Results: Dg Abd 2 Views  Result Date: 10/13/2016 CLINICAL DATA:  Colon carcinoma with abdominal pain EXAM: ABDOMEN - 2 VIEW COMPARISON:  10/11/2016 FINDINGS: Scattered large and small bowel gas is noted similar to that seen on the prior exam. Air-fluid levels are noted. No free air is seen. The overall appearance is similar to that noted on the prior exam. No bony abnormality is noted. IMPRESSION: Stable colonic distention with a air with air-fluid levels  similar to that seen on prior plain film as well as a CT from 10/09/2016 consistent with the given clinical history. Electronically Signed   By: [name] M.D.   On: 10/13/2016 09:19    Medications: . docusate sodium  100 mg Oral BID  . enoxaparin (LOVENOX) injection  40 mg Subcutaneous QHS  . methocarbamol (ROBAXIN)  IV  500 mg Intravenous Q8H    Assessment/Plan Malignant sigmoid colon obstruction secondary to carcinomatosis. s/p Transverse loop colostomy Stage IV colon cancer with carcinomatosis - chemotherapy Dr. Truitt Merle Nausea/constipation Moderate protein calorie malnutrition Anemia of chronic disease FEN: IV fluids/clear liquids ID: No abx DVT: Lovenox  Plan:  Go slow and allow bowel function to return.         LOS: 4 days    [name] 10/14/2016 [phone]

## 2016-10-15 LAB — BASIC METABOLIC PANEL
Anion gap: 10 (ref 5–15)
BUN: <5 mg/dL — ABNORMAL LOW (ref 6–20)
CHLORIDE: 100 mmol/L — AB (ref 101–111)
CO2: 23 mmol/L (ref 22–32)
CREATININE: 0.34 mg/dL — AB (ref 0.44–1.00)
Calcium: 8.4 mg/dL — ABNORMAL LOW (ref 8.9–10.3)
GFR calc Af Amer: >60 mL/min (ref >60)
GFR calc non Af Amer: >60 mL/min (ref >60)
GLUCOSE: 123 mg/dL — AB (ref 65–99)
Potassium: 3.2 mmol/L — ABNORMAL LOW (ref 3.5–5.1)
SODIUM: 133 mmol/L — AB (ref 135–145)

## 2016-10-15 MED ORDER — SENNOSIDES-DOCUSATE SODIUM 8.6-50 MG PO TABS
1.0000 | ORAL_TABLET | Freq: Two times a day (BID) | ORAL | Status: DC
  Administered 2016-10-15 – 2016-10-19 (×10): 1 via ORAL
  Filled 2016-10-15 (×11): qty 1

## 2016-10-15 MED ORDER — CYANOCOBALAMIN 1000 MCG/ML IJ SOLN
1000.0000 ug | INTRAMUSCULAR | Status: DC
  Administered 2016-10-15: 1000 ug via INTRAMUSCULAR
  Filled 2016-10-15: qty 1

## 2016-10-15 MED ORDER — SODIUM CHLORIDE 0.9 % IV SOLN: 30.0000 meq | INTRAVENOUS | Status: DC

## 2016-10-15 MED ORDER — OXYCODONE HCL 5 MG PO TABS
10.0000 mg | ORAL_TABLET | ORAL | Status: DC
Start: 2016-10-15 — End: 2016-10-20
  Administered 2016-10-15 – 2016-10-20 (×27): 10 mg via ORAL
  Filled 2016-10-15 (×29): qty 2

## 2016-10-15 MED ORDER — PROCHLORPERAZINE MALEATE 10 MG PO TABS: 10.0000 mg | ORAL_TABLET | Freq: Four times a day (QID) | ORAL | Status: DC | PRN

## 2016-10-15 MED ORDER — TRAMADOL HCL 50 MG PO TABS: 50.0000 mg | ORAL_TABLET | Freq: Four times a day (QID) | ORAL | Status: DC | PRN

## 2016-10-15 MED ORDER — POTASSIUM CHLORIDE 10 MEQ/100ML IV SOLN
10.0000 meq | INTRAVENOUS | Status: AC
  Administered 2016-10-15 (×6): 10 meq via INTRAVENOUS
  Filled 2016-10-15 (×6): qty 100

## 2016-10-15 NOTE — Progress Notes (Addendum)
PROGRESS NOTE    [name]  AYT:[id number] DOB: [birth year] DOA: 10/09/2016 PCP: Reginia Naas, MD   Brief Narrative:  51 year old female with a history of stage IIIB colon cancer with mets to lung and liver and peritoneal carcinomatosis currently on Folfiri and Avastin, anxiety/depression, hyponatremia presenting with worsening abdominal pain. The patient states that she has not had a bowel movement in 10 days. She tried over-the-counter laxatives including magnesium citrate x 5without results. She has had decreased oral intake and increasing fatigue over the past week. The patient was seen at the [facility] center on 10/04/2016 with worsening abdominal pain. At that time, the patient's oxycodone was increased to 10 mg every 4 hours when necessary pain and CT of the abdomen and pelvis was ordered.   Regarding her oncologic history, the patient was diagnosed with colon cancer and underwent right hemicolectomy on 08/03/2015. She was initiated on chemotherapy on 09/18/2015. PET scan imaging on 05/20/2016 revealed hypermetabolic peritoneal carcinomatosis with 5 scattered pulmonary nodules with possible hypermetabolic mass in the right ventral pelvic wall.  Patient was monitored underwent conservative treatment have is no significant improvement with small bowel obstruction. Serial abdominal films were done with no significant improvement. Patient was followed by general surgery and patient for probable surgical intervention 10/13/2016.    Assessment & Plan:   Principal Problem:   Abdominal pain Active Problems:   Cancer of right colon (HCC)   Hyponatremia   Hypoalbuminemia due to protein-calorie malnutrition (HCC)   UTI (urinary tract infection)   Hyperglycemia   Uncontrolled pain   Peritoneal carcinomatosis (HCC)   Malignant ascites   Malignant neoplasm of colon (HCC)   SBO (small bowel obstruction)   Intractable abdominal pain/uncontrolled pain -Secondary to peritoneal  carcinomatosis causing extrinsic compression -10/09/2016 CT abdomen and pelvis--progression of air-fluid levels with moderately distended colon. Several local areas of stricturing and wall thickening in the sigmoid colon likely representing a degree of partial obstruction. Small bowel loops with signal areas of wall thickening and edema. -appreciate general surgery-->enema with minimal success -appreciate MedOnc -consult [facility]-->?possible colonic stent however per GI no strong likelihood of success with stenting.  -Fluid levels similar to that on prior films as well as CT of 10/09/2016. Contrast has not progressed. Likely unable to do a Gastrografin enema. Patient s/p transverse loop colostomy per Dr. Redmond Pulling 10/13/2016.  -continue home dose oxycodone. -Continue IV dilaudid prn breakthrough pain - Per general surgery.  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 Burr Medico followup  Iron deficiency anemia -managed by Dr. Burr Medico. H&H stable.  Failure to thrive  -consulted nutrition  Fibromyalgia and depression -Continue home dose alprazolam  Hypokalemia Replete.   DVT prophylaxis: Lovenox Code Status: Full. Family Communication: Updated patient. No family at bedside. Disposition Plan: Home when obstruction has resolved and per general surgery.   Consultants:   Gen. surgery: Dr. Redmond Pulling 10/10/2016  Gastroenterology: Dr. Paulita Fujita  Oncology: Dr Burr Medico  Procedures:   CT abdomen and pelvis 10/09/2016  Abdominal xray 10/11/2016,   CXR 10/09/2016  Transverse loop colostomy per Dr. Redmond Pulling 10/13/2016  Antimicrobials:   None   Subjective: Patient sleeping easily arousable. Abdominal pain being  managed on current regimen. Patient with some gas and loose stool noted in ostomy bag. No chest pain. No shortness of breath.  Objective: Vitals:   10/14/16 2047 10/15/16 0507 10/15/16 0511 10/15/16 1004  BP: (!) 143/94 (!) 165/104 (!) 156/100 (!) 136/100  Pulse: (!) 109 (!) 106  (!) 102  Resp: 18 18  18   Temp: 98.1 F (36.7 C) 97.5 F (36.4 C)  97.9 F (36.6 C)  TempSrc: Oral Oral  Oral  SpO2: 100% 98%  99%  Weight:      Height:        Intake/Output Summary (Last 24 hours) at 10/15/16 1022 Last data filed at 10/15/16 0547  Gross per 24 hour  Intake          6526.66 ml  Output                0 ml  Net          6526.66 ml   Filed Weights   10/09/16 1523 10/10/16 0153  Weight: 53.1 kg (117 lb) 56 kg (123 lb 7.3 oz)    Examination:  General exam: Appears calm and comfortable  Respiratory system: Clear to auscultation. Respiratory effort normal. Cardiovascular system: S1 & S2 heard, RRR. No JVD, murmurs, rubs, gallops or clicks. No pedal edema. Gastrointestinal system: Abdomen is less distended, soft and some tenderness to palpation. No organomegaly or masses felt. Hyperactive bowel sounds.Ostomy bag intact with loose stool and gas. Central nervous system: Alert and oriented. No focal neurological deficits. Extremities: Symmetric 5 x 5 power. Skin: No rashes, lesions or ulcers Psychiatry: Judgement and insight appear normal. Mood & affect appropriate.     Data Reviewed: I have personally reviewed following labs and imaging studies  CBC:  Recent Labs Lab 10/09/16 1628 10/10/16 0502 10/11/16 0525 10/13/16 0431 10/14/16 0528  WBC 6.4 5.7 6.3 4.4 4.7  NEUTROABS 4.2  --   --   --  3.1  HGB 9.1* 8.5* 8.7* 8.3* 8.6*  HCT 28.5* 26.5* 27.0* 26.3* 26.6*  MCV 82.6 82.8 84.4 83.2 84.2  PLT 364 335 372 415* 277*   Basic Metabolic Panel:  Recent Labs Lab 10/11/16 0525 10/12/16 0536 10/13/16 0431 10/14/16 0528 10/15/16 0505  NA 133* 133* 132* 135 133*  K 4.1 3.8  3.9 3.7 3.2*  CL 99* 100* 100* 102 100*  CO2 25 23 25 24 23   GLUCOSE 99 72 97 90 123*  BUN <5* <5* <5* <5* <5*  CREATININE 0.55 0.52 0.48 0.44 0.34*  CALCIUM 8.5* 8.2* 8.3* 8.1* 8.4*  MG 2.4 2.1 1.9 1.9  --    GFR: Estimated Creatinine Clearance: 64.5 mL/min (A) (by C-G formula based on SCr of 0.34 mg/dL (L)). Liver Function Tests:  Recent Labs Lab 10/09/16 1628 10/14/16 0528  AST 24 17  ALT 19 15  ALKPHOS 116 103  BILITOT 0.6 0.8  PROT 6.8 5.6*  ALBUMIN 2.6* 2.0*    Recent Labs Lab 10/09/16 1628  LIPASE <10*   No results for input(s): AMMONIA in the last 168 hours. Coagulation Profile:  Recent Labs Lab 10/13/16 0431  INR 1.67   Cardiac Enzymes: No results for input(s): CKTOTAL, CKMB, CKMBINDEX, TROPONINI in the last 168 hours. BNP (last 3 results) No results for input(s): PROBNP in the last 8760 hours. HbA1C: No results for input(s): HGBA1C in the last 72 hours. CBG: No results for input(s): GLUCAP in the last 168 hours. Lipid Profile: No results for input(s): CHOL, HDL, LDLCALC, TRIG, CHOLHDL, LDLDIRECT in the last 72 hours. Thyroid Function Tests: No results for input(s): TSH, T4TOTAL, FREET4, T3FREE, THYROIDAB in the last 72 hours.  Anemia Panel: No results for input(s): VITAMINB12, FOLATE, FERRITIN, TIBC, IRON, RETICCTPCT in the last 72 hours. Sepsis Labs:  Recent Labs Lab 10/09/16 2050  LATICACIDVEN 0.53    Recent Results (from the past 240 hour(s))  Urine culture     Status: Abnormal   Collection Time: 10/09/16  4:29 PM  Result Value Ref Range Status   Specimen Description URINE, RANDOM  Final   Special Requests NONE  Final   Culture (A)  Final    <10,000 COLONIES/mL INSIGNIFICANT GROWTH Performed at [facility], [address] [place], [place] [address]    Report Status 10/11/2016 FINAL  Final  Blood culture (routine x 2)     Status: None   Collection Time: 10/09/16  4:29 PM  Result Value Ref Range Status   Specimen Description  BLOOD RIGHT ANTECUBITAL  Final   Special Requests BOTTLES DRAWN AEROBIC AND ANAEROBIC 5 CC  Final   Culture   Final    NO GROWTH 5 DAYS Performed at [facility], [place] [address], [place], [place] [address]    Report Status 10/14/2016 FINAL  Final  Blood culture (routine x 2)     Status: None   Collection Time: 10/09/16  4:29 PM  Result Value Ref Range Status   Specimen Description BLOOD PORTA CATH  Final   Special Requests IN PEDIATRIC BOTTLE 2ML  Final   Culture   Final    NO GROWTH 5 DAYS Performed at [facility], [place] [address], [place], [place] [address]    Report Status 10/14/2016 FINAL  Final  Surgical pcr screen     Status: None   Collection Time: 10/13/16 10:40 AM  Result Value Ref Range Status   MRSA, PCR NEGATIVE NEGATIVE Final   Staphylococcus aureus NEGATIVE NEGATIVE Final    Comment:        The Xpert SA Assay (FDA approved for NASAL specimens in patients over 90 years of age), is one component of a comprehensive surveillance program.  Test performance has been validated by [facility] for patients greater than or equal to 22 year old. It is not intended to diagnose infection nor to guide or monitor treatment.          Radiology Studies: No results found.      Scheduled Meds: . cyanocobalamin  1,000 mcg Intramuscular Weekly  . enoxaparin (LOVENOX) injection  40 mg Subcutaneous QHS  . methocarbamol (ROBAXIN)  IV  500 mg Intravenous Q8H  . oxyCODONE  10 mg Oral Q4H  . potassium chloride  10 mEq Intravenous Q1 Hr x 6  . senna-docusate  1 tablet Oral BID   Continuous Infusions: . sodium chloride 75 mL/hr at 10/15/16 0945     LOS: 5 days    Time spent: 37 mins    Shireen Rayburn, MD [facility] Pager [phone]  If 7PM-7AM, please contact night-coverage [email or web address] Password [facility] 10/15/2016, 10:22 AM

## 2016-10-15 NOTE — Plan of Care (Signed)
Problem: Education: Goal: Knowledge of [facility] Education information/materials will improve Outcome: Progressing Plan of care reviewed with patient. Patient reported nausea and abdominal pain intermittently throughout shift with relief using PRN modalities ordered. Vital signs stable. Call bell within reach with all needs met at this time. Will continue to monitor for changes.

## 2016-10-15 NOTE — Progress Notes (Signed)
General Surgery [facility]  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 Greer Pickerel  Stage IV colon cancer with carcinomatosis - Dr. Lorie Apley clear liquids at present - no trays  Encouraged OOB, ambulation today         Earnstine Regal, MD, [facility]       Office: [phone]    Subjective: Patient in bed, awake and alert; complains of mild nausea.  Denies flatus.  Objective: Vital signs in last 24 hours: Temp:  [97.5 F (36.4 C)-98.1 F (36.7 C)] 97.5 F (36.4 C) (03/24 0507) Pulse Rate:  [106-113] 106 (03/24 0507) Resp:  [18] 18 (03/24 0507) BP: (135-165)/(92-104) 156/100 (03/24 0511) SpO2:  [98 %-100 %] 98 % (03/24 0507) Last BM Date: 09/28/16  Intake/Output from previous day: 03/23 0701 - 03/24 0700 In: 6526.7 [I.V.:6361.7; IV Piggyback:165] Out: -  Intake/Output this shift: No intake/output data recorded.  Physical Exam: HEENT - sclerae clear, mucous membranes moist Abdomen - protuberant, soft; few BS present; ostomy bag LUQ  Lab Results:   Recent Labs  10/13/16 0431 10/14/16 0528  WBC 4.4 4.7  HGB 8.3* 8.6*  HCT 26.3* 26.6*  PLT 415* 441*   BMET  Recent Labs  10/14/16 0528 10/15/16 0505  NA 135 133*  K 3.7 3.2*  CL 102 100*  CO2 24 23  GLUCOSE 90 123*  BUN <5* <5*  CREATININE 0.44 0.34*  CALCIUM 8.1* 8.4*   PT/INR  Recent Labs  10/13/16 0431  LABPROT 19.9*  INR 1.67   Comprehensive Metabolic Panel:    Component Value Date/Time   NA 133 (L) 10/15/2016 0505   NA 135 10/14/2016 0528   NA 132 (L) 10/04/2016 1327   NA 138 09/29/2016 1130   K 3.2 (L) 10/15/2016 0505   K 3.7 10/14/2016 0528   K 4.5 10/04/2016 1327   K 4.1 09/29/2016 1130   CL 100 (L) 10/15/2016 0505   CL 102 10/14/2016 0528   CO2 23 10/15/2016 0505   CO2 24 10/14/2016 0528   CO2 25 10/04/2016 1327   CO2  24 09/29/2016 1130   BUN <5 (L) 10/15/2016 0505   BUN <5 (L) 10/14/2016 0528   BUN 8.1 10/04/2016 1327   BUN 7.1 09/29/2016 1130   CREATININE 0.34 (L) 10/15/2016 0505   CREATININE 0.44 10/14/2016 0528   CREATININE 0.7 10/04/2016 1327   CREATININE 0.6 09/29/2016 1130   GLUCOSE 123 (H) 10/15/2016 0505   GLUCOSE 90 10/14/2016 0528   GLUCOSE 103 10/04/2016 1327   GLUCOSE 84 09/29/2016 1130   CALCIUM 8.4 (L) 10/15/2016 0505   CALCIUM 8.1 (L) 10/14/2016 0528   CALCIUM 10.3 10/04/2016 1327   CALCIUM 9.5 09/29/2016 1130   AST 17 10/14/2016 0528   AST 24 10/09/2016 1628   AST 11 10/04/2016 1327   AST 12 09/29/2016 1130   ALT 15 10/14/2016 0528   ALT 19 10/09/2016 1628   ALT 9 10/04/2016 1327   ALT 9 09/29/2016 1130   ALKPHOS 103 10/14/2016 0528   ALKPHOS 116 10/09/2016 1628   ALKPHOS 128 10/04/2016 1327   ALKPHOS 113 09/29/2016 1130   BILITOT 0.8 10/14/2016 0528   BILITOT 0.6 10/09/2016 1628   BILITOT 0.42 10/04/2016 1327   BILITOT 0.24 09/29/2016 1130   PROT 5.6 (L) 10/14/2016 0528   PROT 6.8 10/09/2016  1628   PROT 7.5 10/04/2016 1327   PROT 6.6 09/29/2016 1130   ALBUMIN 2.0 (L) 10/14/2016 0528   ALBUMIN 2.6 (L) 10/09/2016 1628   ALBUMIN 3.2 (L) 10/04/2016 1327   ALBUMIN 3.2 (L) 09/29/2016 1130    Studies/Results: Dg Abd 2 Views  Result Date: 10/13/2016 CLINICAL DATA:  Colon carcinoma with abdominal pain EXAM: ABDOMEN - 2 VIEW COMPARISON:  10/11/2016 FINDINGS: Scattered large and small bowel gas is noted similar to that seen on the prior exam. Air-fluid levels are noted. No free air is seen. The overall appearance is similar to that noted on the prior exam. No bony abnormality is noted. IMPRESSION: Stable colonic distention with a air with air-fluid levels similar to that seen on prior plain film as well as a CT from 10/09/2016 consistent with the given clinical history. Electronically Signed   By: [name] M.D.   On: 10/13/2016 09:19      [name]  M 10/15/2016  Patient ID: [name], female   DOB: [birth year], 51 y.o.   MRN: [id number]

## 2016-10-16 LAB — CBC WITH DIFFERENTIAL/PLATELET
BASOS ABS: 0 10*3/uL (ref 0.0–0.1)
Basophils Relative: 0 %
EOS ABS: 0.1 10*3/uL (ref 0.0–0.7)
EOS PCT: 1 %
HCT: 28.5 % — ABNORMAL LOW (ref 36.0–46.0)
Hemoglobin: 9.2 g/dL — ABNORMAL LOW (ref 12.0–15.0)
LYMPHS PCT: 23 %
Lymphs Abs: 1.7 10*3/uL (ref 0.7–4.0)
MCH: 26.5 pg (ref 26.0–34.0)
MCHC: 32.3 g/dL (ref 30.0–36.0)
MCV: 82.1 fL (ref 78.0–100.0)
MONO ABS: 1 10*3/uL (ref 0.1–1.0)
Monocytes Relative: 13 %
Neutro Abs: 4.5 10*3/uL (ref 1.7–7.7)
Neutrophils Relative %: 62 %
Platelets: 520 10*3/uL — ABNORMAL HIGH (ref 150–400)
RBC: 3.47 MIL/uL — AB (ref 3.87–5.11)
RDW: 15.6 % — AB (ref 11.5–15.5)
WBC: 7.3 10*3/uL (ref 4.0–10.5)

## 2016-10-16 LAB — BASIC METABOLIC PANEL
Anion gap: 9 (ref 5–15)
BUN: <5 mg/dL — ABNORMAL LOW (ref 6–20)
CALCIUM: 8.3 mg/dL — AB (ref 8.9–10.3)
CHLORIDE: 102 mmol/L (ref 101–111)
CO2: 24 mmol/L (ref 22–32)
CREATININE: 0.33 mg/dL — AB (ref 0.44–1.00)
GFR calc Af Amer: >60 mL/min (ref >60)
Glucose, Bld: 94 mg/dL (ref 65–99)
Potassium: 3.8 mmol/L (ref 3.5–5.1)
Sodium: 135 mmol/L (ref 135–145)

## 2016-10-16 LAB — MAGNESIUM: MAGNESIUM: 2 mg/dL (ref 1.7–2.4)

## 2016-10-16 NOTE — Progress Notes (Signed)
PROGRESS NOTE    [name]  HMC:[id number] DOB: 02-15-1966 DOA: 10/09/2016 PCP: Reginia Naas, MD   Brief Narrative:  51 year old female with a history of stage IIIB colon cancer with mets to lung and liver and peritoneal carcinomatosis currently on Folfiri and Avastin, anxiety/depression, hyponatremia presenting with worsening abdominal pain. The patient states that she has not had a bowel movement in 10 days. She tried over-the-counter laxatives including magnesium citrate x 5without results. She has had decreased oral intake and increasing fatigue over the past week. The patient was seen at the [facility] center on 10/04/2016 with worsening abdominal pain. At that time, the patient's oxycodone was increased to 10 mg every 4 hours when necessary pain and CT of the abdomen and pelvis was ordered.   Regarding her oncologic history, the patient was diagnosed with colon cancer and underwent right hemicolectomy on 08/03/2015. She was initiated on chemotherapy on 09/18/2015. PET scan imaging on 05/20/2016 revealed hypermetabolic peritoneal carcinomatosis with 5 scattered pulmonary nodules with possible hypermetabolic mass in the right ventral pelvic wall.  Patient was monitored underwent conservative treatment have is no significant improvement with small bowel obstruction. Serial abdominal films were done with no significant improvement. Patient was followed by general surgery and patient for probable surgical intervention 10/13/2016.    Assessment & Plan:   Principal Problem:   Abdominal pain Active Problems:   Cancer of right colon (HCC)   Hyponatremia   Hypoalbuminemia due to protein-calorie malnutrition (HCC)   UTI (urinary tract infection)   Hyperglycemia   Uncontrolled pain   Peritoneal carcinomatosis (HCC)   Malignant ascites   Malignant neoplasm of colon (HCC)   SBO (small bowel obstruction)   Intractable abdominal pain/uncontrolled pain -Secondary to peritoneal  carcinomatosis causing extrinsic compression -10/09/2016 CT abdomen and pelvis--progression of air-fluid levels with moderately distended colon. Several local areas of stricturing and wall thickening in the sigmoid colon likely representing a degree of partial obstruction. Small bowel loops with signal areas of wall thickening and edema. -appreciate general surgery-->enema with minimal success -appreciate MedOnc -consult [facility]-->?possible colonic stent however per GI no strong likelihood of success with stenting.  -Fluid levels similar to that on prior films as well as CT of 10/09/2016. Contrast has not progressed. Likely unable to do a Gastrografin enema.  -Patient s/p transverse loop colostomy per Dr. Redmond Pulling 10/13/2016.  -continue home dose oxycodone. -Continue IV dilaudid prn breakthrough pain - Patient has been placed on clear liquids per general surgery. Mobilization. - Per general surgery.  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 Burr Medico followup  Iron deficiency anemia -managed by Dr. Burr Medico. H&H stable.  Failure to thrive  -consulted nutrition  Fibromyalgia and depression -Continue home dose alprazolam  Hypokalemia Replete.   DVT prophylaxis: Lovenox Code Status: Full. Family Communication: Updated patient. No family at bedside. Disposition Plan: Home when obstruction has resolved and per general surgery.   Consultants:   Gen. surgery: Dr. Redmond Pulling 10/10/2016  Gastroenterology: Dr. Paulita Fujita  Oncology: Dr Burr Medico  Procedures:   CT abdomen and pelvis 10/09/2016  Abdominal xray 10/11/2016,   CXR 10/09/2016  Transverse loop colostomy per Dr. Redmond Pulling 10/13/2016  Antimicrobials:    None   Subjective: Patient sleeping easily arousable.  Patient with some gas and loose stool noted in ostomy bag. No chest  pain. No shortness of breath.   Objective: Vitals:   10/15/16 2030 10/16/16 0000 10/16/16 0436 10/16/16 0934  BP: (!) 150/96 (!) 149/97 (!) 144/103 (!) 147/97  Pulse: (!) 104 (!) 102 (!) 101 (!) 101  Resp: 20 20 18 16   Temp: 97.9 F (36.6 C) 98 F (36.7 C) 98 F (36.7 C) 98 F (36.7 C)  TempSrc: Axillary Oral Oral Oral  SpO2: 98% 98% 98% 99%  Weight:      Height:        Intake/Output Summary (Last 24 hours) at 10/16/16 1055 Last data filed at 10/16/16 0436  Gross per 24 hour  Intake              360 ml  Output              300 ml  Net               60 ml   Filed Weights   10/09/16 1523 10/10/16 0153  Weight: 53.1 kg (117 lb) 56 kg (123 lb 7.3 oz)    Examination:  General exam: Appears calm and comfortable  Respiratory system: Clear to auscultation. Respiratory effort normal. Cardiovascular system: S1 & S2 heard, RRR. No JVD, murmurs, rubs, gallops or clicks. No pedal edema. Gastrointestinal system: Abdomen is less distended, soft and some tenderness to palpation. No organomegaly or masses felt. Hyperactive bowel sounds.Ostomy bag intact with loose stool and gas. Central nervous system: Alert and oriented. No focal neurological deficits. Extremities: Symmetric 5 x 5 power. Skin: No rashes, lesions or ulcers Psychiatry: Judgement and insight appear normal. Mood & affect appropriate.     Data Reviewed: I have personally reviewed following labs and imaging studies  CBC:  Recent Labs Lab 10/09/16 1628 10/10/16 0502 10/11/16 0525 10/13/16 0431 10/14/16 0528 10/16/16 0437  WBC 6.4 5.7 6.3 4.4 4.7 7.3  NEUTROABS 4.2  --   --   --  3.1 4.5  HGB 9.1* 8.5* 8.7* 8.3* 8.6* 9.2*  HCT 28.5* 26.5* 27.0* 26.3* 26.6* 28.5*  MCV 82.6 82.8 84.4 83.2 84.2 82.1  PLT 364 335 372 415* 441* 373*   Basic Metabolic Panel:  Recent Labs Lab  10/11/16 0525 10/12/16 0536 10/13/16 0431 10/14/16 0528 10/15/16 0505 10/16/16 0437  NA 133* 133* 132* 135 133* 135  K 4.1 3.8 3.9 3.7 3.2* 3.8  CL 99* 100* 100* 102 100* 102  CO2 25 23 25 24 23 24   GLUCOSE 99 72 97 90 123* 94  BUN <5* <5* <5* <5* <5* <5*  CREATININE 0.55 0.52 0.48 0.44 0.34* 0.33*  CALCIUM 8.5* 8.2* 8.3* 8.1* 8.4* 8.3*  MG 2.4 2.1 1.9 1.9  --  2.0   GFR: Estimated Creatinine Clearance: 64.5 mL/min (A) (by C-G formula based on SCr of 0.33 mg/dL (L)). Liver Function Tests:  Recent Labs Lab 10/09/16 1628 10/14/16 0528  AST 24 17  ALT 19 15  ALKPHOS 116 103  BILITOT 0.6 0.8  PROT 6.8 5.6*  ALBUMIN 2.6* 2.0*    Recent Labs Lab 10/09/16 1628  LIPASE <10*   No results for input(s): AMMONIA in the last 168 hours. Coagulation Profile:  Recent Labs Lab 10/13/16 0431  INR 1.67   Cardiac Enzymes: No results for input(s): CKTOTAL, CKMB, CKMBINDEX, TROPONINI in the last 168 hours. BNP (last 3 results) No results for input(s): PROBNP in the last 8760 hours. HbA1C: No results for input(s): HGBA1C in the last 72 hours. CBG: No results for input(s): GLUCAP in the  last 168 hours. Lipid Profile: No results for input(s): CHOL, HDL, LDLCALC, TRIG, CHOLHDL, LDLDIRECT in the last 72 hours. Thyroid Function Tests: No results for input(s): TSH, T4TOTAL, FREET4, T3FREE, THYROIDAB in the last 72 hours. Anemia Panel: No results for input(s): VITAMINB12, FOLATE, FERRITIN, TIBC, IRON, RETICCTPCT in the last 72 hours. Sepsis Labs:  Recent Labs Lab 10/09/16 2050  LATICACIDVEN 0.53    Recent Results (from the past 240 hour(s))  Urine culture     Status: Abnormal   Collection Time: 10/09/16  4:29 PM  Result Value Ref Range Status   Specimen Description URINE, RANDOM  Final   Special Requests NONE  Final   Culture (A)  Final    <10,000 COLONIES/mL INSIGNIFICANT GROWTH Performed at [facility], [address] [place], [place] [address]    Report  Status 10/11/2016 FINAL  Final  Blood culture (routine x 2)     Status: None   Collection Time: 10/09/16  4:29 PM  Result Value Ref Range Status   Specimen Description BLOOD RIGHT ANTECUBITAL  Final   Special Requests BOTTLES DRAWN AEROBIC AND ANAEROBIC 5 CC  Final   Culture   Final    NO GROWTH 5 DAYS Performed at [facility], [place] [address], [place], [place] [address]    Report Status 10/14/2016 FINAL  Final  Blood culture (routine x 2)     Status: None   Collection Time: 10/09/16  4:29 PM  Result Value Ref Range Status   Specimen Description BLOOD PORTA CATH  Final   Special Requests IN PEDIATRIC BOTTLE 2ML  Final   Culture   Final    NO GROWTH 5 DAYS Performed at [facility], [place] [address] [place], [place] [address]    Report Status 10/14/2016 FINAL  Final  Surgical pcr screen     Status: None   Collection Time: 10/13/16 10:40 AM  Result Value Ref Range Status   MRSA, PCR NEGATIVE NEGATIVE Final   Staphylococcus aureus NEGATIVE NEGATIVE Final    Comment:        The Xpert SA Assay (FDA approved for NASAL specimens in patients over 2 years of age), is one component of a comprehensive surveillance program.  Test performance has been validated by [facility] for patients greater than or equal to 76 year old. It is not intended to diagnose infection nor to guide or monitor treatment.          Radiology Studies: No results found.      Scheduled Meds: . cyanocobalamin  1,000 mcg Intramuscular Weekly  . enoxaparin (LOVENOX) injection  40 mg Subcutaneous QHS  . methocarbamol (ROBAXIN)  IV  500 mg Intravenous Q8H  . oxyCODONE  10 mg Oral Q4H  . senna-docusate  1 tablet Oral BID   Continuous Infusions: . sodium chloride 75 mL/hr at 10/15/16 2337     LOS: 6 days    Time spent: 106 mins    Francessca Friis, MD [facility] Pager [phone]  If 7PM-7AM, please contact night-coverage [email or web address] Password [facility] 10/16/2016, 10:55  AM

## 2016-10-16 NOTE — Progress Notes (Signed)
General Surgery [facility]  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 Greer Pickerel             Stage IV colon cancer with carcinomatosis - Dr. Truitt Merle             Begin clear liquid diet today             Encouraged OOB, ambulation today        Earnstine Regal, MD, [facility]       Office: [phone]    Subjective: Patient appears comfortable, arouses easily from sleep.  Some flatus in bag.  Less nausea.  Objective: Vital signs in last 24 hours: Temp:  [97.9 F (36.6 C)-98.1 F (36.7 C)] 98 F (36.7 C) (03/25 0436) Pulse Rate:  [101-107] 101 (03/25 0436) Resp:  [18-20] 18 (03/25 0436) BP: (136-157)/(96-107) 144/103 (03/25 0436) SpO2:  [98 %-100 %] 98 % (03/25 0436) Last BM Date: 10/15/16 (small amount in pouch now)  Intake/Output from previous day: 03/24 0701 - 03/25 0700 In: 360 [P.O.:360] Out: 300 [Urine:250; Stool:50] Intake/Output this shift: No intake/output data recorded.  Physical Exam: HEENT - sclerae clear, mucous membranes moist Abdomen - protuberant, ostomy LUQ with small gas in bag Neuro - alert & oriented, no focal deficits  Lab Results:   Recent Labs  10/14/16 0528 10/16/16 0437  WBC 4.7 7.3  HGB 8.6* 9.2*  HCT 26.6* 28.5*  PLT 441* 520*   BMET  Recent Labs  10/15/16 0505 10/16/16 0437  NA 133* 135  K 3.2* 3.8  CL 100* 102  CO2 23 24  GLUCOSE 123* 94  BUN <5* <5*  CREATININE 0.34* 0.33*  CALCIUM 8.4* 8.3*   PT/INR No results for input(s): LABPROT, INR in the last 72 hours. Comprehensive Metabolic Panel:    Component Value Date/Time   NA 135 10/16/2016 0437   NA 133 (L) 10/15/2016 0505   NA 132 (L) 10/04/2016 1327   NA 138 09/29/2016 1130   K 3.8 10/16/2016 0437   K 3.2 (L) 10/15/2016 0505   K 4.5 10/04/2016 1327   K 4.1 09/29/2016 1130   CL 102  10/16/2016 0437   CL 100 (L) 10/15/2016 0505   CO2 24 10/16/2016 0437   CO2 23 10/15/2016 0505   CO2 25 10/04/2016 1327   CO2 24 09/29/2016 1130   BUN <5 (L) 10/16/2016 0437   BUN <5 (L) 10/15/2016 0505   BUN 8.1 10/04/2016 1327   BUN 7.1 09/29/2016 1130   CREATININE 0.33 (L) 10/16/2016 0437   CREATININE 0.34 (L) 10/15/2016 0505   CREATININE 0.7 10/04/2016 1327   CREATININE 0.6 09/29/2016 1130   GLUCOSE 94 10/16/2016 0437   GLUCOSE 123 (H) 10/15/2016 0505   GLUCOSE 103 10/04/2016 1327   GLUCOSE 84 09/29/2016 1130   CALCIUM 8.3 (L) 10/16/2016 0437   CALCIUM 8.4 (L) 10/15/2016 0505   CALCIUM 10.3 10/04/2016 1327   CALCIUM 9.5 09/29/2016 1130   AST 17 10/14/2016 0528   AST 24 10/09/2016 1628   AST 11 10/04/2016 1327   AST 12 09/29/2016 1130   ALT 15 10/14/2016 0528   ALT 19 10/09/2016 1628   ALT 9 10/04/2016 1327   ALT  9 09/29/2016 1130   ALKPHOS 103 10/14/2016 0528   ALKPHOS 116 10/09/2016 1628   ALKPHOS 128 10/04/2016 1327   ALKPHOS 113 09/29/2016 1130   BILITOT 0.8 10/14/2016 0528   BILITOT 0.6 10/09/2016 1628   BILITOT 0.42 10/04/2016 1327   BILITOT 0.24 09/29/2016 1130   PROT 5.6 (L) 10/14/2016 0528   PROT 6.8 10/09/2016 1628   PROT 7.5 10/04/2016 1327   PROT 6.6 09/29/2016 1130   ALBUMIN 2.0 (L) 10/14/2016 0528   ALBUMIN 2.6 (L) 10/09/2016 1628   ALBUMIN 3.2 (L) 10/04/2016 1327   ALBUMIN 3.2 (L) 09/29/2016 1130    Studies/Results: No results found.    [name] 10/16/2016  Patient ID: [name], female   DOB: [birth year], 51 y.o.   MRN: [id number]

## 2016-10-16 NOTE — Progress Notes (Signed)
Pt's PAC accessed on 3/18 and due for reaccess today. I offered but pt declined tonight, stating her fiance and dog are on the way to visit. She understands the reasoning and importance of reaccessing PAC weekly. Will offer again in AM.

## 2016-10-17 ENCOUNTER — Other Ambulatory Visit: Payer: Self-pay | Admitting: Hematology

## 2016-10-17 LAB — BASIC METABOLIC PANEL
Anion gap: 4 — ABNORMAL LOW (ref 5–15)
CALCIUM: 8.1 mg/dL — AB (ref 8.9–10.3)
CO2: 29 mmol/L (ref 22–32)
CREATININE: 0.38 mg/dL — AB (ref 0.44–1.00)
Chloride: 100 mmol/L — ABNORMAL LOW (ref 101–111)
GFR calc Af Amer: >60 mL/min (ref >60)
Glucose, Bld: 86 mg/dL (ref 65–99)
POTASSIUM: 3.1 mmol/L — AB (ref 3.5–5.1)
SODIUM: 133 mmol/L — AB (ref 135–145)

## 2016-10-17 LAB — CBC
HCT: 26.4 % — ABNORMAL LOW (ref 36.0–46.0)
Hemoglobin: 8.6 g/dL — ABNORMAL LOW (ref 12.0–15.0)
MCH: 27.4 pg (ref 26.0–34.0)
MCHC: 32.6 g/dL (ref 30.0–36.0)
MCV: 84.1 fL (ref 78.0–100.0)
PLATELETS: 415 10*3/uL — AB (ref 150–400)
RBC: 3.14 MIL/uL — AB (ref 3.87–5.11)
RDW: 16 % — AB (ref 11.5–15.5)
WBC: 7 10*3/uL (ref 4.0–10.5)

## 2016-10-17 MED ORDER — SODIUM CHLORIDE 0.9 % IV SOLN: 30.0000 meq | INTRAVENOUS | Status: DC

## 2016-10-17 MED ORDER — POTASSIUM CHLORIDE 10 MEQ/100ML IV SOLN
10.0000 meq | INTRAVENOUS | Status: AC
  Administered 2016-10-17 (×6): 10 meq via INTRAVENOUS
  Filled 2016-10-17 (×6): qty 100

## 2016-10-17 MED ORDER — COBIMETINIB FUMARATE 20 MG PO TABS: 60.0000 mg | ORAL_TABLET | Freq: Every day | ORAL | 0 refills | Status: DC

## 2016-10-17 NOTE — Progress Notes (Signed)
[name]   DOB:1965-12-02   SE#:[id number]   WVP#:[id number]  ONCOLOGY FOLLOW UP   Subjective: [name] underwent transverse loop colostomy on 09/23/2016 by Dr. Redmond Pulling. She tolerated surgery well. She has good stool output in the colostomy bag. She tolerating liquid diet well. Her pain is controlled, no significant nausea.   Objective:  Vitals:   10/17/16 0405 10/17/16 0927  BP: (!) 138/93 139/84  Pulse: 100 99  Resp: 17 16  Temp: 97.8 F (36.6 C) 98.4 F (36.9 C)    Body mass index is 24.11 kg/m.  Intake/Output Summary (Last 24 hours) at 10/17/16 1332 Last data filed at 10/17/16 1106  Gross per 24 hour  Intake              200 ml  Output               50 ml  Net              150 ml     Sclerae unicteric  Oropharynx clear  No peripheral adenopathy  Lungs clear -- no rales or rhonchi  Heart regular rate and rhythm  Abdomen distended, diffuse tenderness   MSK no focal spinal tenderness, no peripheral edema  Neuro nonfocal   CBG (last 3)  No results for input(s): GLUCAP in the last 72 hours.   Labs:  Lab Results  Component Value Date   WBC 7.0 10/17/2016   HGB 8.6 (L) 10/17/2016   HCT 26.4 (L) 10/17/2016   MCV 84.1 10/17/2016   PLT 415 (H) 10/17/2016   NEUTROABS 4.5 10/16/2016   CMP Latest Ref Rng & Units 10/17/2016 10/16/2016 10/15/2016  Glucose 65 - 99 mg/dL 86 94 123(H)  BUN 6 - 20 mg/dL <5(L) <5(L) <5(L)  Creatinine 0.44 - 1.00 mg/dL 0.38(L) 0.33(L) 0.34(L)  Sodium 135 - 145 mmol/L 133(L) 135 133(L)  Potassium 3.5 - 5.1 mmol/L 3.1(L) 3.8 3.2(L)  Chloride 101 - 111 mmol/L 100(L) 102 100(L)  CO2 22 - 32 mmol/L 29 24 23   Calcium 8.9 - 10.3 mg/dL 8.1(L) 8.3(L) 8.4(L)  Total Protein 6.5 - 8.1 g/dL - - -  Total Bilirubin 0.3 - 1.2 mg/dL - - -  Alkaline Phos 38 - 126 U/L - - -  AST 15 - 41 U/L - - -  ALT 14 - 54 U/L - - -    Urine Studies No results for input(s): UHGB, CRYS in the last 72 hours.  Invalid input(s): UACOL, UAPR, USPG, UPH, UTP, UGL, [place],  UBIL, UNIT, UROB, [place], UEPI, UWBC, Junie Panning [place], [place], [place]  Basic Metabolic Panel:  Recent Labs Lab 10/11/16 [phone] 10/12/16 0536 10/13/16 0431 10/14/16 0528 10/15/16 0505 10/16/16 0437 10/17/16 0511  NA 133* 133* 132* 135 133* 135 133*  K 4.1 3.8 3.9 3.7 3.2* 3.8 3.1*  CL 99* 100* 100* 102 100* 102 100*  CO2 25 23 25 24 23 24 29   GLUCOSE 99 72 97 90 123* 94 86  BUN <5* <5* <5* <5* <5* <5* <5*  CREATININE 0.55 0.52 0.48 0.44 0.34* 0.33* 0.38*  CALCIUM 8.5* 8.2* 8.3* 8.1* 8.4* 8.3* 8.1*  MG 2.4 2.1 1.9 1.9  --  2.0  --    GFR Estimated Creatinine Clearance: 64.5 mL/min (A) (by C-G formula based on SCr of 0.38 mg/dL (L)). Liver Function Tests:  Recent Labs Lab 10/14/16 0528  AST 17  ALT 15  ALKPHOS 103  BILITOT 0.8  PROT 5.6*  ALBUMIN 2.0*   No results for input(s):  LIPASE, AMYLASE in the last 168 hours. No results for input(s): AMMONIA in the last 168 hours. Coagulation profile  Recent Labs Lab 10/13/16 0431  INR 1.67    CBC:  Recent Labs Lab 10/11/16 0525 10/13/16 0431 10/14/16 0528 10/16/16 0437 10/17/16 0511  WBC 6.3 4.4 4.7 7.3 7.0  NEUTROABS  --   --  3.1 4.5  --   HGB 8.7* 8.3* 8.6* 9.2* 8.6*  HCT 27.0* 26.3* 26.6* 28.5* 26.4*  MCV 84.4 83.2 84.2 82.1 84.1  PLT 372 415* 441* 520* 415*   Cardiac Enzymes: No results for input(s): CKTOTAL, CKMB, CKMBINDEX, TROPONINI in the last 168 hours. BNP: Invalid input(s): POCBNP CBG: No results for input(s): GLUCAP in the last 168 hours. D-Dimer No results for input(s): DDIMER in the last 72 hours. Hgb A1c No results for input(s): HGBA1C in the last 72 hours. Lipid Profile No results for input(s): CHOL, HDL, LDLCALC, TRIG, CHOLHDL, LDLDIRECT in the last 72 hours. Thyroid function studies No results for input(s): TSH, T4TOTAL, T3FREE, THYROIDAB in the last 72 hours.  Invalid input(s): FREET3 Anemia work up No results for input(s): VITAMINB12, FOLATE, FERRITIN, TIBC, IRON, RETICCTPCT in the  last 72 hours. Microbiology Recent Results (from the past 240 hour(s))  Urine culture     Status: Abnormal   Collection Time: 10/09/16  4:29 PM  Result Value Ref Range Status   Specimen Description URINE, RANDOM  Final   Special Requests NONE  Final   Culture (A)  Final    <10,000 COLONIES/mL INSIGNIFICANT GROWTH Performed at [facility], [place] [address] [place], [place] [address]    Report Status 10/11/2016 FINAL  Final  Blood culture (routine x 2)     Status: None   Collection Time: 10/09/16  4:29 PM  Result Value Ref Range Status   Specimen Description BLOOD RIGHT ANTECUBITAL  Final   Special Requests BOTTLES DRAWN AEROBIC AND ANAEROBIC 5 CC  Final   Culture   Final    NO GROWTH 5 DAYS Performed at [facility], [place] [address], [place], [place] [address]    Report Status 10/14/2016 FINAL  Final  Blood culture (routine x 2)     Status: None   Collection Time: 10/09/16  4:29 PM  Result Value Ref Range Status   Specimen Description BLOOD PORTA CATH  Final   Special Requests IN PEDIATRIC BOTTLE 2ML  Final   Culture   Final    NO GROWTH 5 DAYS Performed at [facility], [place] [address], [place], [place] [address]    Report Status 10/14/2016 FINAL  Final  Surgical pcr screen     Status: None   Collection Time: 10/13/16 10:40 AM  Result Value Ref Range Status   MRSA, PCR NEGATIVE NEGATIVE Final   Staphylococcus aureus NEGATIVE NEGATIVE Final    Comment:        The Xpert SA Assay (FDA approved for NASAL specimens in patients over 59 years of age), is one component of a comprehensive surveillance program.  Test performance has been validated by [facility] for patients greater than or equal to 58 year old. It is not intended to diagnose infection nor to guide or monitor treatment.       Studies:  No results found.  Assessment: 51 y.o. female, with past medical history of fibromyalgia, depression, presented with ruptured cecum and pericolonic  abscess, required multiple drainage, antibiotics, and eventually right colectomy, which revealed stage IIIA colon adenocarcinoma. She developed  peritoneal carcinomatosis in 04/2016. She was admitted for bowel obstruction.   1. Metastatic right colon cancer to peritoneum, on palliative chemo 2. Partial bowel obstruction, likely secondary to peritoneum carcinomatosis 3. Abdominal pain secondary to #1 and #2 4. Anemia secondary to chemotherapy, iron deficiency and malignancy 5. Moderate to severe malnutrition 6. Fibromyalgia and depression   Plan:  -appreciate Dr. Redmond Pulling and the surgical colleagues's excellent care. -plan to advance her diet, per Dr. Barry Dienes   -I plan to see her back in 2 weeks to discuss her next systemic treatment option, which will likely be off label keytruda + cobimetinib, I will work on getting her free drugs in the next few weeks.     Truitt Merle, MD 10/17/2016  1:32 PM

## 2016-10-17 NOTE — Progress Notes (Signed)
PROGRESS NOTE    [name]  GDJ:[id number] DOB: 1966-06-16 DOA: 10/09/2016 PCP: Reginia Naas, MD   Brief Narrative:  51 year old female with a history of stage IIIB colon cancer with mets to lung and liver and peritoneal carcinomatosis currently on Folfiri and Avastin, anxiety/depression, hyponatremia presenting with worsening abdominal pain. The patient states that she has not had a bowel movement in 10 days. She tried over-the-counter laxatives including magnesium citrate x 5without results. She has had decreased oral intake and increasing fatigue over the past week. The patient was seen at the [facility] on 10/04/2016 with worsening abdominal pain. At that time, the patient's oxycodone was increased to 10 mg every 4 hours when necessary pain and CT of the abdomen and pelvis was ordered.   Regarding her oncologic history, the patient was diagnosed with colon cancer and underwent right hemicolectomy on 08/03/2015. She was initiated on chemotherapy on 09/18/2015. PET scan imaging on 05/20/2016 revealed hypermetabolic peritoneal carcinomatosis with 5 scattered pulmonary nodules with possible hypermetabolic mass in the right ventral pelvic wall.  Patient was monitored underwent conservative treatment have is no significant improvement with small bowel obstruction. Serial abdominal films were done with no significant improvement. Patient was followed by general surgery and patient for probable surgical intervention 10/13/2016.    Assessment & Plan:   Principal Problem:   Abdominal pain Active Problems:   Cancer of right colon (HCC)   Hyponatremia   Hypoalbuminemia due to protein-calorie malnutrition (HCC)   UTI (urinary tract infection)   Hyperglycemia   Uncontrolled pain   Peritoneal carcinomatosis (HCC)   Malignant ascites   Malignant neoplasm of colon (HCC)   SBO (small bowel obstruction)   Intractable abdominal pain/uncontrolled pain -Secondary to peritoneal  carcinomatosis causing extrinsic compression -10/09/2016 CT abdomen and pelvis--progression of air-fluid levels with moderately distended colon. Several local areas of stricturing and wall thickening in the sigmoid colon likely representing a degree of partial obstruction. Small bowel loops with signal areas of wall thickening and edema. -appreciate general surgery-->enema with minimal success -appreciate MedOnc -consult [facility]-->?possible colonic stent however per GI no strong likelihood of success with stenting.  -Fluid levels similar to that on prior films as well as CT of 10/09/2016. Contrast had not progressed. Likely unable to do a Gastrografin enema.  -Patient s/p transverse loop colostomy per Dr. Redmond Pulling 10/13/2016.  -continue home dose oxycodone. -Continue IV dilaudid prn breakthrough pain - Patient tolerating clear liquid diet and diet advanced to full liquid diet per general surgery. Mobilization. - Per general surgery.  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 Burr Medico followup  Iron deficiency anemia -managed by Dr. Burr Medico. H&H stable.  Failure to thrive  -consulted nutrition  Fibromyalgia and depression -Continue home dose alprazolam  Hypokalemia Replete.   DVT prophylaxis: Lovenox Code Status: Full. Family Communication: Updated patient. No family at bedside. Disposition Plan: Home when obstruction has resolved and per general surgery.   Consultants:   Gen. surgery: Dr. Redmond Pulling 10/10/2016  Gastroenterology: Dr. Paulita Fujita  Oncology: Dr Burr Medico  Procedures:   CT abdomen and pelvis 10/09/2016  Abdominal xray 10/11/2016,   CXR 10/09/2016  Transverse loop colostomy per Dr. Redmond Pulling  10/13/2016  Antimicrobials:   None   Subjective: Patient sleeping easily arousable.  Patient with some gas and loose stool noted  in ostomy bag. No chest pain. No shortness of breath. Patient states some improvement with abdominal pain.  Objective: Vitals:   10/16/16 1950 10/17/16 0001 10/17/16 0405 10/17/16 0927  BP: (!) 136/94 (!) 134/93 (!) 138/93 139/84  Pulse: (!) 109 (!) 103 100 99  Resp: 15 16 17 16   Temp: 98.3 F (36.8 C) 98.4 F (36.9 C) 97.8 F (36.6 C) 98.4 F (36.9 C)  TempSrc: Oral Oral Oral Oral  SpO2: 98% 98% 100% 98%  Weight:      Height:        Intake/Output Summary (Last 24 hours) at 10/17/16 1012 Last data filed at 10/17/16 0031  Gross per 24 hour  Intake                0 ml  Output               50 ml  Net              -50 ml   Filed Weights   10/09/16 1523 10/10/16 0153  Weight: 53.1 kg (117 lb) 56 kg (123 lb 7.3 oz)    Examination:  General exam: Appears calm and comfortable  Respiratory system: Clear to auscultation. Respiratory effort normal. Cardiovascular system: S1 & S2 heard, RRR. No JVD, murmurs, rubs, gallops or clicks. No pedal edema. Gastrointestinal system: Abdomen is less distended, soft and some tenderness to palpation. No organomegaly or masses felt. Hyperactive bowel sounds.Ostomy bag intact with loose stool and gas. Central nervous system: Alert and oriented. No focal neurological deficits. Extremities: Symmetric 5 x 5 power. Skin: No rashes, lesions or ulcers Psychiatry: Judgement and insight appear normal. Mood & affect appropriate.     Data Reviewed: I have personally reviewed following labs and imaging studies  CBC:  Recent Labs Lab 10/11/16 0525 10/13/16 0431 10/14/16 0528 10/16/16 0437 10/17/16 0511  WBC 6.3 4.4 4.7 7.3 7.0  NEUTROABS  --   --  3.1 4.5  --   HGB 8.7* 8.3* 8.6* 9.2* 8.6*  HCT 27.0* 26.3* 26.6* 28.5* 26.4*  MCV 84.4 83.2 84.2 82.1 84.1  PLT 372 415* 441* 520* 277*   Basic Metabolic  Panel:  Recent Labs Lab 10/11/16 0525 10/12/16 0536 10/13/16 0431 10/14/16 0528 10/15/16 0505 10/16/16 0437 10/17/16 0511  NA 133* 133* 132* 135 133* 135 133*  K 4.1 3.8 3.9 3.7 3.2* 3.8 3.1*  CL 99* 100* 100* 102 100* 102 100*  CO2 25 23 25 24 23 24 29   GLUCOSE 99 72 97 90 123* 94 86  BUN <5* <5* <5* <5* <5* <5* <5*  CREATININE 0.55 0.52 0.48 0.44 0.34* 0.33* 0.38*  CALCIUM 8.5* 8.2* 8.3* 8.1* 8.4* 8.3* 8.1*  MG 2.4 2.1 1.9 1.9  --  2.0  --    GFR: Estimated Creatinine Clearance: 64.5 mL/min (A) (by C-G formula based on SCr of 0.38 mg/dL (L)). Liver Function Tests:  Recent Labs Lab 10/14/16 0528  AST 17  ALT 15  ALKPHOS 103  BILITOT 0.8  PROT 5.6*  ALBUMIN 2.0*   No results for input(s): LIPASE, AMYLASE in the last 168 hours. No results for input(s): AMMONIA in the last 168 hours. Coagulation Profile:  Recent Labs Lab 10/13/16 0431  INR 1.67   Cardiac Enzymes: No results for input(s): CKTOTAL, CKMB, CKMBINDEX, TROPONINI in the last 168 hours. BNP (last 3 results) No results for input(s): PROBNP in the last 8760 hours. HbA1C: No results for input(s): HGBA1C in the last 72 hours. CBG:  No results for input(s): GLUCAP in the last 168 hours. Lipid Profile: No results for input(s): CHOL, HDL, LDLCALC, TRIG, CHOLHDL, LDLDIRECT in the last 72 hours. Thyroid Function Tests: No results for input(s): TSH, T4TOTAL, FREET4, T3FREE, THYROIDAB in the last 72 hours. Anemia Panel: No results for input(s): VITAMINB12, FOLATE, FERRITIN, TIBC, IRON, RETICCTPCT in the last 72 hours. Sepsis Labs: No results for input(s): PROCALCITON, LATICACIDVEN in the last 168 hours.  Recent Results (from the past 240 hour(s))  Urine culture     Status: Abnormal   Collection Time: 10/09/16  4:29 PM  Result Value Ref Range Status   Specimen Description URINE, RANDOM  Final   Special Requests NONE  Final   Culture (A)  Final    <10,000 COLONIES/mL INSIGNIFICANT GROWTH Performed at  [facility], [address], [place], [place] [address]    Report Status 10/11/2016 FINAL  Final  Blood culture (routine x 2)     Status: None   Collection Time: 10/09/16  4:29 PM  Result Value Ref Range Status   Specimen Description BLOOD RIGHT ANTECUBITAL  Final   Special Requests BOTTLES DRAWN AEROBIC AND ANAEROBIC 5 CC  Final   Culture   Final    NO GROWTH 5 DAYS Performed at [facility], [place] [address], [place], [place] [address]    Report Status 10/14/2016 FINAL  Final  Blood culture (routine x 2)     Status: None   Collection Time: 10/09/16  4:29 PM  Result Value Ref Range Status   Specimen Description BLOOD PORTA CATH  Final   Special Requests IN PEDIATRIC BOTTLE 2ML  Final   Culture   Final    NO GROWTH 5 DAYS Performed at [facility], [place] [address], [place], [place] [address]    Report Status 10/14/2016 FINAL  Final  Surgical pcr screen     Status: None   Collection Time: 10/13/16 10:40 AM  Result Value Ref Range Status   MRSA, PCR NEGATIVE NEGATIVE Final   Staphylococcus aureus NEGATIVE NEGATIVE Final    Comment:        The Xpert SA Assay (FDA approved for NASAL specimens in patients over 77 years of age), is one component of a comprehensive surveillance program.  Test performance has been validated by [facility] for patients greater than or equal to 37 year old. It is not intended to diagnose infection nor to guide or monitor treatment.          Radiology Studies: No results found.      Scheduled Meds: . cyanocobalamin  1,000 mcg Intramuscular Weekly  . enoxaparin (LOVENOX) injection  40 mg Subcutaneous QHS  . methocarbamol (ROBAXIN)  IV  500 mg Intravenous Q8H  . oxyCODONE  10 mg Oral Q4H  . potassium chloride  10 mEq Intravenous Q1 Hr x 6  . senna-docusate  1 tablet Oral BID   Continuous Infusions: . sodium chloride 75 mL/hr at 10/15/16 2337     LOS: 7 days    Time spent: 19 mins    Tina Goffredo,  MD [facility] Pager [phone]  If 7PM-7AM, please contact night-coverage [email or web address] Password [email or web address] 10/17/2016, 10:12 AM

## 2016-10-17 NOTE — Progress Notes (Signed)
START OFF PATHWAY REGIMEN - Colorectal   [id number]:Pembrolizumab 200 mg q21 Days:   A cycle is 21 days:     Pembrolizumab   **Always confirm dose/schedule in your pharmacy ordering system**    Patient Characteristics: Metastatic Colorectal, Third Line, KRAS Mutation Positive/Unknown, BRAF Wild-Type/Unknown, MSS / pMMR Current evidence of distant metastases? Yes AJCC T Category: TX AJCC N Category: NX AJCC M Category: M1c AJCC 8 Stage Grouping: IVC BRAF Mutation Status: Wild Type (no mutation) KRAS/NRAS Mutation Status: Mutation Positive Line of therapy: Third Line Would you be surprised if this patient died  in the next year? I would NOT be surprised if this patient died in the next year Microsatellite/Mismatch Repair Status: MSS/pMMR  Intent of Therapy: Non-Curative / Palliative Intent, Discussed with Patient

## 2016-10-17 NOTE — Progress Notes (Signed)
4 Days Post-Op  Subjective: She is sleeping and says she feels better.  Some stool in her ostomy.    Objective: Vital signs in last 24 hours: Temp:  [97.8 F (36.6 C)-98.8 F (37.1 C)] 97.8 F (36.6 C) (03/26 0405) Pulse Rate:  [100-109] 100 (03/26 0405) Resp:  [15-17] 17 (03/26 0405) BP: (134-147)/(93-97) 138/93 (03/26 0405) SpO2:  [98 %-100 %] 100 % (03/26 0405) Last BM Date: 10/16/16 (increased stool output from ostomy today) PO not recorded Voided x 6 Stool:  50 recorded Afebrile, BP up some, ongoing low grade tachycardia K+ 3.1/Mag 2.0 yesterday No films     Intake/Output from previous day: 03/25 0701 - 03/26 0700 In: -  Out: 50 [Stool:50] Intake/Output this shift: No intake/output data recorded.  General appearance: alert, cooperative and no distress GI: soft, less distended and tender than last Friday.  Stool in her ostomy bag.  Sites look fine.    Lab Results:   Recent Labs  10/16/16 0437 10/17/16 0511  WBC 7.3 7.0  HGB 9.2* 8.6*  HCT 28.5* 26.4*  PLT 520* 415*    BMET  Recent Labs  10/16/16 0437 10/17/16 0511  NA 135 133*  K 3.8 3.1*  CL 102 100*  CO2 24 29  GLUCOSE 94 86  BUN <5* <5*  CREATININE 0.33* 0.38*  CALCIUM 8.3* 8.1*   PT/INR No results for input(s): LABPROT, INR in the last 72 hours.   Recent Labs Lab 10/14/16 0528  AST 17  ALT 15  ALKPHOS 103  BILITOT 0.8  PROT 5.6*  ALBUMIN 2.0*     Lipase     Component Value Date/Time   LIPASE <10 (L) 10/09/2016 1628     Studies/Results: No results found.  Medications: . cyanocobalamin  1,000 mcg Intramuscular Weekly  . enoxaparin (LOVENOX) injection  40 mg Subcutaneous QHS  . methocarbamol (ROBAXIN)  IV  500 mg Intravenous Q8H  . oxyCODONE  10 mg Oral Q4H  . potassium chloride  10 mEq Intravenous Q1 Hr x 6  . senna-docusate  1 tablet Oral BID   . sodium chloride 75 mL/hr at 10/15/16 2337   Assessment/Plan Malignant sigmoid colon obstruction secondary to  carcinomatosis. s/p Transverse loop colostomy 3,22/18, Dr. Greer Pickerel Stage IV colon cancer with carcinomatosis - chemotherapy Dr. Truitt Merle Nausea/constipation Moderate protein calorie malnutrition Anemia of chronic disease FEN: IV fluids/clear liquids ID: No abx since pre-op DVT: Lovenox  PlAN:  Advance her to full liquids and see how she does, get her walking in the halls. She has reduced her Dilaudid and is using mostly oxycodone 50 mg yesterday.      LOS: 7 days    Marguarite Markov 10/17/2016 [phone]

## 2016-10-18 ENCOUNTER — Telehealth: Payer: Self-pay | Admitting: Hematology

## 2016-10-18 LAB — BASIC METABOLIC PANEL
ANION GAP: 6 (ref 5–15)
CO2: 28 mmol/L (ref 22–32)
Calcium: 8.5 mg/dL — ABNORMAL LOW (ref 8.9–10.3)
Chloride: 99 mmol/L — ABNORMAL LOW (ref 101–111)
Creatinine, Ser: 0.39 mg/dL — ABNORMAL LOW (ref 0.44–1.00)
GFR calc Af Amer: >60 mL/min (ref >60)
GFR calc non Af Amer: >60 mL/min (ref >60)
GLUCOSE: 91 mg/dL (ref 65–99)
POTASSIUM: 3.6 mmol/L (ref 3.5–5.1)
Sodium: 133 mmol/L — ABNORMAL LOW (ref 135–145)

## 2016-10-18 MED ORDER — SODIUM CHLORIDE 0.9 % IV SOLN: 30.0000 meq | Freq: Once | INTRAVENOUS | Status: DC

## 2016-10-18 MED ORDER — POTASSIUM CHLORIDE 10 MEQ/100ML IV SOLN
10.0000 meq | INTRAVENOUS | Status: AC
  Administered 2016-10-18 (×3): 10 meq via INTRAVENOUS
  Filled 2016-10-18 (×3): qty 100

## 2016-10-18 NOTE — Telephone Encounter (Signed)
Unable to reach patient. Scheduled appt per schedule message from MD. Left appt time and date and sent reminder letter.

## 2016-10-18 NOTE — Consult Note (Signed)
[facility] Nurse ostomy follow up Stoma type/location: LUQ colostomy (loop with plastic bridge in place). Would like to remove on Thursday, 3/29.  If ok with CCS, please order. Stomal assessment/size:  elliptical opening,1 and 1/2 inch x 2 inches wide. Slightly budded. Peristomal assessment: intact Treatment options for stomal/peristomal skin: skin barrier ring Output: tan pasty stool Ostomy pouching: 2pc. 2 and 3/4 inch with skin barrier ring Education provided: Patient taught today how to perform lock and roll closure, demonstrated how to empty pouch and taught how to make toilet paper "wicks"  I will work with her to become independent in emptying.  Will require assistance for pouch changes. Enrolled patient in [facility] Start Discharge program: No WOC nursing team will follow, and will remain available to this patient, the nursing and medical teams.  . Thanks, Maudie Flakes, MSN, RN, [place], Arther Abbott  Pager# [phone]

## 2016-10-18 NOTE — Progress Notes (Signed)
Patient ID: [name], female   DOB: 08/31/1965, 51 y.o.   MRN: [id number]  [facility] Surgery Progress Note  5 Days Post-Op  Subjective: Feeling well today. Tolerated full liquids for dinner last night. Denies nausea or vomiting. Pain well controlled. She has not needed any morphine today; taking oxycodone 10mg  q4 hours. Good output from colostomy.  Objective: Vital signs in last 24 hours: Temp:  [97.3 F (36.3 C)-98.6 F (37 C)] 97.8 F (36.6 C) (03/27 0925) Pulse Rate:  [99-106] 106 (03/27 0925) Resp:  [16-18] 16 (03/27 0925) BP: (128-151)/(85-107) 138/92 (03/27 0925) SpO2:  [99 %-100 %] 99 % (03/27 0925) Last BM Date: 10/18/16  Intake/Output from previous day: 03/26 0701 - 03/27 0700 In: 1650 [P.O.:550; I.V.:900; IV Piggyback:200] Out: 10 [Stool:10] Intake/Output this shift: Total I/O In: 240 [P.O.:240] Out: -   PE: Gen:  Alert, NAD, pleasant Pulm:  Effort normal Abd: Soft, mild distension, +BS, colostomy with brown liquid stool and air in bag, mild left sided TTP  Lab Results:   Recent Labs  10/16/16 0437 10/17/16 0511  WBC 7.3 7.0  HGB 9.2* 8.6*  HCT 28.5* 26.4*  PLT 520* 415*   BMET  Recent Labs  10/17/16 0511 10/18/16 0527  NA 133* 133*  K 3.1* 3.6  CL 100* 99*  CO2 29 28  GLUCOSE 86 91  BUN <5* <5*  CREATININE 0.38* 0.39*  CALCIUM 8.1* 8.5*   PT/INR No results for input(s): LABPROT, INR in the last 72 hours. CMP     Component Value Date/Time   NA 133 (L) 10/18/2016 0527   NA 132 (L) 10/04/2016 1327   K 3.6 10/18/2016 0527   K 4.5 10/04/2016 1327   CL 99 (L) 10/18/2016 0527   CO2 28 10/18/2016 0527   CO2 25 10/04/2016 1327   GLUCOSE 91 10/18/2016 0527   GLUCOSE 103 10/04/2016 1327   BUN <5 (L) 10/18/2016 0527   BUN 8.1 10/04/2016 1327   CREATININE 0.39 (L) 10/18/2016 0527   CREATININE 0.7 10/04/2016 1327   CALCIUM 8.5 (L) 10/18/2016 0527   CALCIUM 10.3 10/04/2016 1327   PROT 5.6 (L) 10/14/2016 0528   PROT 7.5 10/04/2016  1327   ALBUMIN 2.0 (L) 10/14/2016 0528   ALBUMIN 3.2 (L) 10/04/2016 1327   AST 17 10/14/2016 0528   AST 11 10/04/2016 1327   ALT 15 10/14/2016 0528   ALT 9 10/04/2016 1327   ALKPHOS 103 10/14/2016 0528   ALKPHOS 128 10/04/2016 1327   BILITOT 0.8 10/14/2016 0528   BILITOT 0.42 10/04/2016 1327   GFRNONAA >60 10/18/2016 0527   GFRAA >60 10/18/2016 0527   Lipase     Component Value Date/Time   LIPASE <10 (L) 10/09/2016 1628       Studies/Results: No results found.  Anti-infectives: Anti-infectives    Start     Dose/Rate Route Frequency Ordered Stop   10/13/16 1000  ciprofloxacin (CIPRO) IVPB 400 mg     400 mg 200 mL/hr over 60 Minutes Intravenous On call to O.R. 10/13/16 0902 10/13/16 1424   10/13/16 1000  metroNIDAZOLE (FLAGYL) IVPB 500 mg     500 mg 100 mL/hr over 60 Minutes Intravenous On call to O.R. 10/13/16 0902 10/13/16 1425   10/10/16 0115  ciprofloxacin (CIPRO) tablet 500 mg  Status:  Discontinued     500 mg Oral 2 times daily 10/10/16 0108 10/10/16 0120       Assessment/Plan Malignant sigmoid colon obstruction secondary to carcinomatosis. s/p Transverse loop colostomy 10/13/16,  Dr. Greer Pickerel - POD 5  Stage IV colon cancer with carcinomatosis - chemotherapy Dr. Truitt Merle Nausea/constipation Moderate protein calorie malnutrition Anemia of chronic disease  FEN: soft diet ID: No abx since pre-op DVT: Lovenox  PLAN:  Advance to soft diet. Continue ambulating.    LOS: 8 days    Jerrye Beavers , [facility] 10/18/2016, 12:20 PM Pager: [phone] Consults: [phone] Mon-Fri 7:00 am-4:30 pm Sat-Sun 7:00 am-11:30 am

## 2016-10-18 NOTE — Progress Notes (Signed)
PROGRESS NOTE    [name]  SJG:[id number] DOB: 07-May-1966 DOA: 10/09/2016 PCP: Reginia Naas, MD   Brief Narrative:  51 year old female with a history of stage IIIB colon cancer with mets to lung and liver and peritoneal carcinomatosis currently on Folfiri and Avastin, anxiety/depression, hyponatremia presenting with worsening abdominal pain. The patient states that she has not had a bowel movement in 10 days. She tried over-the-counter laxatives including magnesium citrate x 5without results. She has had decreased oral intake and increasing fatigue over the past week. The patient was seen at the [facility] on 10/04/2016 with worsening abdominal pain. At that time, the patient's oxycodone was increased to 10 mg every 4 hours when necessary pain and CT of the abdomen and pelvis was ordered.   Regarding her oncologic history, the patient was diagnosed with colon cancer and underwent right hemicolectomy on 08/03/2015. She was initiated on chemotherapy on 09/18/2015. PET scan imaging on 05/20/2016 revealed hypermetabolic peritoneal carcinomatosis with 5 scattered pulmonary nodules with possible hypermetabolic mass in the right ventral pelvic wall.  Patient was monitored underwent conservative treatment have is no significant improvement with small bowel obstruction. Serial abdominal films were done with no significant improvement. Patient was followed by general surgery and patient for probable surgical intervention 10/13/2016.    Assessment & Plan:   Principal Problem:   Abdominal pain Active Problems:   Cancer of right colon (HCC)   Hyponatremia   Hypoalbuminemia due to protein-calorie malnutrition (HCC)   UTI (urinary tract infection)   Hyperglycemia   Uncontrolled pain   Peritoneal carcinomatosis (HCC)   Malignant ascites   Malignant neoplasm of colon (HCC)   SBO (small bowel obstruction)   Intractable abdominal pain/uncontrolled pain -Secondary to peritoneal  carcinomatosis causing extrinsic compression -10/09/2016 CT abdomen and pelvis--progression of air-fluid levels with moderately distended colon. Several local areas of stricturing and wall thickening in the sigmoid colon likely representing a degree of partial obstruction. Small bowel loops with signal areas of wall thickening and edema. -appreciate general surgery-->enema with minimal success -appreciate MedOnc -consult [facility]-->?possible colonic stent however per GI no strong likelihood of success with stenting.  -Fluid levels similar to that on prior films as well as CT of 10/09/2016. Contrast had not progressed. Likely unable to do a Gastrografin enema.  -Patient s/p transverse loop colostomy per Dr. Redmond Pulling 10/13/2016.  -continue home dose oxycodone. -Continue IV dilaudid prn breakthrough pain - Patient tolerating full liquid diet and diet has been advanced to a soft diet per general surgery. Mobilization. - Per general surgery.  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 Burr Medico followup - Outpatient follow-up with oncology.  Iron deficiency anemia -managed by Dr. Burr Medico. H&H stable.  Failure to thrive  -consulted nutrition  Fibromyalgia and depression -Continue home dose alprazolam  Hypokalemia Replete.   DVT prophylaxis: Lovenox Code Status: Full. Family Communication: Updated patient. No family at bedside. Disposition Plan: Home when obstruction has resolved and per general surgery.   Consultants:   Gen. surgery: Dr. Redmond Pulling 10/10/2016  Gastroenterology: Dr. Paulita Fujita  Oncology: Dr Burr Medico  Procedures:   CT abdomen and pelvis 10/09/2016  Abdominal xray 10/11/2016,   CXR  10/09/2016  Transverse loop colostomy per Dr. Redmond Pulling 10/13/2016  Antimicrobials:   None   Subjective: Patient sleeping easily arousable.  No  chest pain. No shortness of breath. Patient states abdominal pain control her home regimen of oral pain medications. Patient states passing flatus and stool in ostomy bag.  Objective: Vitals:   10/17/16 2154 10/18/16 0151 10/18/16 0524 10/18/16 0925  BP: (!) 142/95 128/88 130/89 (!) 138/92  Pulse: (!) 101 (!) 101 (!) 104 (!) 106  Resp:  17 17 16   Temp:  97.3 F (36.3 C) 97.9 F (36.6 C) 97.8 F (36.6 C)  TempSrc:  Oral Oral Oral  SpO2:  100% 100% 99%  Weight:      Height:        Intake/Output Summary (Last 24 hours) at 10/18/16 1121 Last data filed at 10/18/16 0900  Gross per 24 hour  Intake             1440 ml  Output               10 ml  Net             1430 ml   Filed Weights   10/09/16 1523 10/10/16 0153  Weight: 53.1 kg (117 lb) 56 kg (123 lb 7.3 oz)    Examination:  General exam: Appears calm and comfortable  Respiratory system: Clear to auscultation. Respiratory effort normal. Cardiovascular system: S1 & S2 heard, RRR. No JVD, murmurs, rubs, gallops or clicks. No pedal edema. Gastrointestinal system: Abdomen is less distended, soft and some tenderness to palpation. No organomegaly or masses felt. Hyperactive bowel sounds.Ostomy bag intact with loose stool and gas. Central nervous system: Alert and oriented. No focal neurological deficits. Extremities: Symmetric 5 x 5 power. Skin: No rashes, lesions or ulcers Psychiatry: Judgement and insight appear normal. Mood & affect appropriate.     Data Reviewed: I have personally reviewed following labs and imaging studies  CBC:  Recent Labs Lab 10/13/16 0431 10/14/16 0528 10/16/16 0437 10/17/16 0511  WBC 4.4 4.7 7.3 7.0  NEUTROABS  --  3.1 4.5  --   HGB 8.3* 8.6* 9.2* 8.6*  HCT 26.3* 26.6* 28.5* 26.4*  MCV 83.2 84.2 82.1 84.1  PLT 415* 441* 520* 415*   Basic  Metabolic Panel:  Recent Labs Lab 10/12/16 0536 10/13/16 0431 10/14/16 0528 10/15/16 0505 10/16/16 0437 10/17/16 0511 10/18/16 0527  NA 133* 132* 135 133* 135 133* 133*  K 3.8 3.9 3.7 3.2* 3.8 3.1* 3.6  CL 100* 100* 102 100* 102 100* 99*  CO2 23 25 24 23 24 29 28   GLUCOSE 72 97 90 123* 94 86 91  BUN <5* <5* <5* <5* <5* <5* <5*  CREATININE 0.52 0.48 0.44 0.34* 0.33* 0.38* 0.39*  CALCIUM 8.2* 8.3* 8.1* 8.4* 8.3* 8.1* 8.5*  MG 2.1 1.9 1.9  --  2.0  --   --    GFR: Estimated Creatinine Clearance: 64.5 mL/min (A) (by C-G formula based on SCr of 0.39 mg/dL (L)). Liver Function Tests:  Recent Labs Lab 10/14/16 0528  AST 17  ALT 15  ALKPHOS 103  BILITOT 0.8  PROT 5.6*  ALBUMIN 2.0*   No results for input(s): LIPASE, AMYLASE in the last 168 hours. No results for input(s): AMMONIA in the last 168 hours. Coagulation Profile:  Recent Labs Lab 10/13/16 0431  INR 1.67   Cardiac Enzymes: No results for input(s): CKTOTAL, CKMB, CKMBINDEX, TROPONINI in the last 168 hours. BNP (last 3 results) No results for input(s): PROBNP in the last 8760 hours. HbA1C: No results for input(s): HGBA1C in the last 72 hours. CBG: No results for input(s):  GLUCAP in the last 168 hours. Lipid Profile: No results for input(s): CHOL, HDL, LDLCALC, TRIG, CHOLHDL, LDLDIRECT in the last 72 hours. Thyroid Function Tests: No results for input(s): TSH, T4TOTAL, FREET4, T3FREE, THYROIDAB in the last 72 hours. Anemia Panel: No results for input(s): VITAMINB12, FOLATE, FERRITIN, TIBC, IRON, RETICCTPCT in the last 72 hours. Sepsis Labs: No results for input(s): PROCALCITON, LATICACIDVEN in the last 168 hours.  Recent Results (from the past 240 hour(s))  Urine culture     Status: Abnormal   Collection Time: 10/09/16  4:29 PM  Result Value Ref Range Status   Specimen Description URINE, RANDOM  Final   Special Requests NONE  Final   Culture (A)  Final    <10,000 COLONIES/mL INSIGNIFICANT  GROWTH Performed at [facility], [address], [place], [place] [address]    Report Status 10/11/2016 FINAL  Final  Blood culture (routine x 2)     Status: None   Collection Time: 10/09/16  4:29 PM  Result Value Ref Range Status   Specimen Description BLOOD RIGHT ANTECUBITAL  Final   Special Requests BOTTLES DRAWN AEROBIC AND ANAEROBIC 5 CC  Final   Culture   Final    NO GROWTH 5 DAYS Performed at [facility], [place] [address], [place], [place] [address]    Report Status 10/14/2016 FINAL  Final  Blood culture (routine x 2)     Status: None   Collection Time: 10/09/16  4:29 PM  Result Value Ref Range Status   Specimen Description BLOOD PORTA CATH  Final   Special Requests IN PEDIATRIC BOTTLE 2ML  Final   Culture   Final    NO GROWTH 5 DAYS Performed at [facility], [place] [address], [place], [place] [address]    Report Status 10/14/2016 FINAL  Final  Surgical pcr screen     Status: None   Collection Time: 10/13/16 10:40 AM  Result Value Ref Range Status   MRSA, PCR NEGATIVE NEGATIVE Final   Staphylococcus aureus NEGATIVE NEGATIVE Final    Comment:        The Xpert SA Assay (FDA approved for NASAL specimens in patients over 57 years of age), is one component of a comprehensive surveillance program.  Test performance has been validated by [facility] for patients greater than or equal to 40 year old. It is not intended to diagnose infection nor to guide or monitor treatment.          Radiology Studies: No results found.      Scheduled Meds: . cyanocobalamin  1,000 mcg Intramuscular Weekly  . enoxaparin (LOVENOX) injection  40 mg Subcutaneous QHS  . methocarbamol (ROBAXIN)  IV  500 mg Intravenous Q8H  . oxyCODONE  10 mg Oral Q4H  . potassium chloride  10 mEq Intravenous Q1 Hr x 3  . senna-docusate  1 tablet Oral BID   Continuous Infusions: . sodium chloride 1,000 mL (10/18/16 0529)     LOS: 8 days    Time spent: 24  mins    Cordelia Bessinger, MD [facility] Pager [phone]  If 7PM-7AM, please contact night-coverage [email or web address] Password [facility] 10/18/2016, 11:21 AM

## 2016-10-19 DIAGNOSIS — R1084 Generalized abdominal pain: Secondary | ICD-10-CM

## 2016-10-19 LAB — BASIC METABOLIC PANEL
Anion gap: 9 (ref 5–15)
CHLORIDE: 96 mmol/L — AB (ref 101–111)
CO2: 28 mmol/L (ref 22–32)
CREATININE: 0.36 mg/dL — AB (ref 0.44–1.00)
Calcium: 8.7 mg/dL — ABNORMAL LOW (ref 8.9–10.3)
GFR calc non Af Amer: >60 mL/min (ref >60)
GLUCOSE: 86 mg/dL (ref 65–99)
Potassium: 3.8 mmol/L (ref 3.5–5.1)
Sodium: 133 mmol/L — ABNORMAL LOW (ref 135–145)

## 2016-10-19 NOTE — Care Management Note (Signed)
Case Management Note  Patient Details  Name: [name] MRN: [id number] Date of Birth: Jul 07, 1966  Subjective/Objective:        51 yo admitted with abdominal pain and underwent a loop colostomy on 3/22.            Action/Plan: Pt from home with spouse. Pt requesting a HHRN for new ostomy support once discharged. Will need HHRN order from MD. Pt offered choice and [facility] chosen. [facility] rep contacted for referral.   Expected Discharge Date:                  Expected Discharge Plan:  [place]  In-House Referral:     Discharge planning Services  CM Consult  Post Acute Care Choice:    Choice offered to:  Patient  DME Arranged:    DME Agency:     HH Arranged:  RN [place] Agency:  [facility] (now [facility])  Status of Service:  In process, will continue to follow  If discussed at Long Length of Stay Meetings, dates discussed:    Additional CommentsLynnell Catalan, RN 10/19/2016, 1:41 PM  [phone]

## 2016-10-19 NOTE — Progress Notes (Signed)
PROGRESS NOTE    [name]  DPO:[id number] DOB: 08/16/1965 DOA: 10/09/2016 PCP: Reginia Naas, MD   Brief Narrative:  51 year old female with a history of stage IIIB colon cancer with mets to lung and liver and peritoneal carcinomatosis currently on Folfiri and Avastin, anxiety/depression, hyponatremia presenting with worsening abdominal pain. The patient states that she has not had a bowel movement in 10 days. She tried over-the-counter laxatives including magnesium citrate x 5without results. She has had decreased oral intake and increasing fatigue over the past week. The patient was seen at the [facility] on 10/04/2016 with worsening abdominal pain. At that time, the patient's oxycodone was increased to 10 mg every 4 hours when necessary pain and CT of the abdomen and pelvis was ordered.   Regarding her oncologic history, the patient was diagnosed with colon cancer and underwent right hemicolectomy on 08/03/2015. She was initiated on chemotherapy on 09/18/2015. PET scan imaging on 05/20/2016 revealed hypermetabolic peritoneal carcinomatosis with 5 scattered pulmonary nodules with possible hypermetabolic mass in the right ventral pelvic wall.  Patient was monitored underwent conservative treatment have is no significant improvement with small bowel obstruction. Serial abdominal films were done with no significant improvement. Patient was followed by general surgery and patient for probable surgical intervention 10/13/2016.    Assessment & Plan:   Principal Problem:   Abdominal pain Active Problems:   Cancer of right colon (HCC)   Hyponatremia   Hypoalbuminemia due to protein-calorie malnutrition (HCC)   UTI (urinary tract infection)   Hyperglycemia   Uncontrolled pain   Peritoneal carcinomatosis (HCC)   Malignant ascites   Malignant neoplasm of colon (HCC)   SBO (small bowel obstruction)   Intractable abdominal pain/uncontrolled pain -Secondary to peritoneal  carcinomatosis causing extrinsic compression -10/09/2016 CT abdomen and pelvis--progression of air-fluid levels with moderately distended colon. Several local areas of stricturing and wall thickening in the sigmoid colon likely representing a degree of partial obstruction. Small bowel loops with signal areas of wall thickening and edema. -appreciate general surgery-->enema with minimal success -appreciate [facility] -consult [facility]-->?possible colonic stent however per GI no strong likelihood of success with stenting.  -Fluid levels similar to that on prior films as well as CT of 10/09/2016. Contrast had not progressed. Likely unable to do a Gastrografin enema.  -Patient s/p transverse loop colostomy per Dr. Redmond Pulling 10/13/2016.  -continue home dose oxycodone. -Continue IV dilaudid prn breakthrough pain - Adv diet as tolerated, wound care team following. They are in process of teaching her how to change her bags. Today they are going to observe as patient does it on her own.  -follow up with outpatient surgery.   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 Burr Medico followup. Currently getting chemo. Follow up outpatient in 2 weeks.  Iron deficiency anemia -managed by Dr. Burr Medico. H&H stable.  Failure to thrive  -consulted nutrition  Fibromyalgia and depression -Continue home dose alprazolam  Hypokalemia Replete.   DVT prophylaxis: Lovenox Code Status: Full. Family Communication: Patient comprehends well.  Disposition Plan: Likely home tomorrow.    Consultants:   Gen. surgery: Dr. Redmond Pulling 10/10/2016  Gastroenterology: Dr. Paulita Fujita  Oncology: Dr Burr Medico  Procedures:   CT abdomen and pelvis  10/09/2016  Abdominal xray 10/11/2016,   CXR 10/09/2016  Transverse loop colostomy per Dr.  Wilson 10/13/2016  Antimicrobials:   None   Subjective: No complaints this morning. Her abd is slightly distended but dooesnt have any active complaints. Advancing her diet today.   Objective: Vitals:   10/18/16 1807 10/18/16 2123 10/19/16 0546 10/19/16 0952  BP: (!) 141/90 135/82 125/81 121/81  Pulse: (!) 114 (!) 109 (!) 106 (!) 105  Resp: 18 17 17 16   Temp: 99.5 F (37.5 C) 97.8 F (36.6 C) 97.7 F (36.5 C) 97.8 F (36.6 C)  TempSrc: Oral Oral Oral Oral  SpO2: 99% 99% 99% 100%  Weight:      Height:        Intake/Output Summary (Last 24 hours) at 10/19/16 1427 Last data filed at 10/19/16 0710  Gross per 24 hour  Intake              240 ml  Output              631 ml  Net             -391 ml   Filed Weights   10/09/16 1523 10/10/16 0153  Weight: 53.1 kg (117 lb) 56 kg (123 lb 7.3 oz)    Examination:  General exam: Appears calm and comfortable  Respiratory system: Clear to auscultation. Respiratory effort normal. Cardiovascular system: S1 & S2 heard, RRR. No JVD, murmurs, rubs, gallops or clicks. No pedal edema. Gastrointestinal system: Abdomen is less distended, soft and some tenderness to palpation but much improved. No organomegaly or masses felt. Hyperactive bowel sounds.Ostomy bag intact with loose stool and gas. Central nervous system: Alert and oriented. No focal neurological deficits. Extremities: Symmetric 5 x 5 power. Skin: No rashes, lesions or ulcers Psychiatry: Judgement and insight appear normal. Mood & affect appropriate.     Data Reviewed: I have personally reviewed following labs and imaging studies  CBC:  Recent Labs Lab 10/13/16 0431 10/14/16 0528 10/16/16 0437 10/17/16 0511  WBC 4.4 4.7 7.3 7.0  NEUTROABS  --  3.1 4.5  --   HGB 8.3* 8.6* 9.2* 8.6*  HCT 26.3* 26.6* 28.5* 26.4*  MCV 83.2 84.2 82.1 84.1  PLT 415* 441* 520* 415*   Basic  Metabolic Panel:  Recent Labs Lab 10/13/16 0431 10/14/16 0528 10/15/16 0505 10/16/16 0437 10/17/16 0511 10/18/16 0527 10/19/16 0500  NA 132* 135 133* 135 133* 133* 133*  K 3.9 3.7 3.2* 3.8 3.1* 3.6 3.8  CL 100* 102 100* 102 100* 99* 96*  CO2 25 24 23 24 29 28 28   GLUCOSE 97 90 123* 94 86 91 86  BUN <5* <5* <5* <5* <5* <5* <5*  CREATININE 0.48 0.44 0.34* 0.33* 0.38* 0.39* 0.36*  CALCIUM 8.3* 8.1* 8.4* 8.3* 8.1* 8.5* 8.7*  MG 1.9 1.9  --  2.0  --   --   --    GFR: Estimated Creatinine Clearance: 64.5 mL/min (A) (by C-G formula based on SCr of 0.36 mg/dL (L)). Liver Function Tests:  Recent Labs Lab 10/14/16 0528  AST 17  ALT 15  ALKPHOS 103  BILITOT 0.8  PROT 5.6*  ALBUMIN 2.0*   No results for input(s): LIPASE, AMYLASE in the last 168 hours. No results for input(s): AMMONIA in the last 168 hours. Coagulation Profile:  Recent Labs Lab 10/13/16 0431  INR 1.67   Cardiac Enzymes: No results for input(s): CKTOTAL, CKMB, CKMBINDEX, TROPONINI in the last 168 hours. BNP (last 3 results) No results for input(s): PROBNP in the last 8760 hours. HbA1C: No results for input(s): HGBA1C  in the last 72 hours. CBG: No results for input(s): GLUCAP in the last 168 hours. Lipid Profile: No results for input(s): CHOL, HDL, LDLCALC, TRIG, CHOLHDL, LDLDIRECT in the last 72 hours. Thyroid Function Tests: No results for input(s): TSH, T4TOTAL, FREET4, T3FREE, THYROIDAB in the last 72 hours. Anemia Panel: No results for input(s): VITAMINB12, FOLATE, FERRITIN, TIBC, IRON, RETICCTPCT in the last 72 hours. Sepsis Labs: No results for input(s): PROCALCITON, LATICACIDVEN in the last 168 hours.  Recent Results (from the past 240 hour(s))  Urine culture     Status: Abnormal   Collection Time: 10/09/16  4:29 PM  Result Value Ref Range Status   Specimen Description URINE, RANDOM  Final   Special Requests NONE  Final   Culture (A)  Final    <10,000 COLONIES/mL INSIGNIFICANT  GROWTH Performed at [facility], [address] [place], [place] [address]    Report Status 10/11/2016 FINAL  Final  Blood culture (routine x 2)     Status: None   Collection Time: 10/09/16  4:29 PM  Result Value Ref Range Status   Specimen Description BLOOD RIGHT ANTECUBITAL  Final   Special Requests BOTTLES DRAWN AEROBIC AND ANAEROBIC 5 CC  Final   Culture   Final    NO GROWTH 5 DAYS Performed at [facility], [place] [address] [place], [place] [address]    Report Status 10/14/2016 FINAL  Final  Blood culture (routine x 2)     Status: None   Collection Time: 10/09/16  4:29 PM  Result Value Ref Range Status   Specimen Description BLOOD PORTA CATH  Final   Special Requests IN PEDIATRIC BOTTLE 2ML  Final   Culture   Final    NO GROWTH 5 DAYS Performed at [facility], [place] [address], [place], [place] [address]    Report Status 10/14/2016 FINAL  Final  Surgical pcr screen     Status: None   Collection Time: 10/13/16 10:40 AM  Result Value Ref Range Status   MRSA, PCR NEGATIVE NEGATIVE Final   Staphylococcus aureus NEGATIVE NEGATIVE Final    Comment:        The Xpert SA Assay (FDA approved for NASAL specimens in patients over 74 years of age), is one component of a comprehensive surveillance program.  Test performance has been validated by [facility] for patients greater than or equal to 75 year old. It is not intended to diagnose infection nor to guide or monitor treatment.          Radiology Studies: No results found.      Scheduled Meds: . cyanocobalamin  1,000 mcg Intramuscular Weekly  . enoxaparin (LOVENOX) injection  40 mg Subcutaneous QHS  . methocarbamol (ROBAXIN)  IV  500 mg Intravenous Q8H  . oxyCODONE  10 mg Oral Q4H  . senna-docusate  1 tablet Oral BID   Continuous Infusions:    LOS: 9 days    Time spent: 40 mins    Nina Mondor Arsenio Loader, MD [facility] Pager [phone]  If 7PM-7AM, please contact  night-coverage [email or web address] Password [facility] 10/19/2016, 2:27 PM

## 2016-10-19 NOTE — Consult Note (Addendum)
[place] Nurse ostomy follow up Stoma type/location: LUQ colostomy Stomal assessment/size: 1 and 1/2 inch x 2 inches with rod in place.  Plan to remove with [facility] PA Will Creig Hines tomorrow am. Peristomal assessment: Not seen today Treatment options for stomal/peristomal skin: skin barrier ring Output: brown stool, more liquid today Ostomy pouching: 2pc. 2 and 3/4 inch pouching system with skin barrier ring Education provided: patient is independent in pouch emptying.  Will need support for pouch changes twice weekly and PRN.  HHRN is ordered. Enrolled patient in [place] Start Discharge program: Yes [place] nursing team will follow, and will remain available to this patient, the nursing, surgical and medical teams.   Thanks, Maudie Flakes, MSN, RN, [place], Arther Abbott  Pager# [phone]

## 2016-10-19 NOTE — Progress Notes (Signed)
6 Days Post-Op  Subjective: Tolerating a soft diet, abdomen still distended.  Says he is going home in the next day or 2.  No sutures need to be removed.  Sites all look good.  Objective: Vital signs in last 24 hours: Temp:  [97.7 F (36.5 C)-99.5 F (37.5 C)] 97.8 F (36.6 C) (03/28 0952) Pulse Rate:  [105-114] 105 (03/28 0952) Resp:  [16-18] 16 (03/28 0952) BP: (121-141)/(81-98) 121/81 (03/28 0952) SpO2:  [99 %-100 %] 100 % (03/28 0952) Last BM Date: 10/18/16 480 PO Voided x 5 Stool 620 Afebrile, VSS Labs OK Intake/Output from previous day: 03/27 0701 - 03/28 0700 In: 480 [P.O.:480] Out: 621 [Stool:621] Intake/Output this shift: Total I/O In: -  Out: 10 [Stool:10]  General appearance: alert, cooperative and no distress GI: still somewhat distended, tolerating diet.  ostomy working well.  port sites OK.  Lab Results:   Recent Labs  10/17/16 0511  WBC 7.0  HGB 8.6*  HCT 26.4*  PLT 415*    BMET  Recent Labs  10/18/16 0527 10/19/16 0500  NA 133* 133*  K 3.6 3.8  CL 99* 96*  CO2 28 28  GLUCOSE 91 86  BUN <5* <5*  CREATININE 0.39* 0.36*  CALCIUM 8.5* 8.7*   PT/INR No results for input(s): LABPROT, INR in the last 72 hours.   Recent Labs Lab 10/14/16 0528  AST 17  ALT 15  ALKPHOS 103  BILITOT 0.8  PROT 5.6*  ALBUMIN 2.0*     Lipase     Component Value Date/Time   LIPASE <10 (L) 10/09/2016 1628     Studies/Results: No results found.  Medications: . cyanocobalamin  1,000 mcg Intramuscular Weekly  . enoxaparin (LOVENOX) injection  40 mg Subcutaneous QHS  . methocarbamol (ROBAXIN)  IV  500 mg Intravenous Q8H  . oxyCODONE  10 mg Oral Q4H  . 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 s/p Transverse loop colostomy 10/13/16, Dr. Greer Pickerel - POD 6  Stage IV colon cancer with carcinomatosis - chemotherapy Dr. Truitt Merle Nausea/constipation Moderate protein  calorie malnutrition Anemia of chronic disease  FEN: soft diet ID: No abx since pre-op DVT: Lovenox   Plan:  She can go from our standpoint whenever she is ready form [facility] standpoint. I have put follow up and D/C instruction in the AVS.   LOS: 9 days    Jaleeya Mcnelly 10/19/2016 [phone] \

## 2016-10-19 NOTE — Discharge Instructions (Signed)
Colostomy Home Guide, Adult A colostomy is a surgical procedure to make an opening (stoma) for stool (feces) to leave your body. This surgery is done when a medical condition prevents stool from leaving your body through the end of the large intestine (rectum). During the surgery, part of the large intestine (colon) is attached to the stoma that is made in the front of your abdomen. A bag (pouch) is fitted over the stoma. Stool and gas will collect in the bag. After having this surgery, you will need to empty and change your colostomy bag as needed. You will also need to care for the stoma. How do I care for my stoma? Your stoma should look pink, red, and moist, like the inside of your cheek. At first, the stoma may be swollen, but this swelling will go away within 6 weeks. To care for the stoma:  Keep the skin around the stoma clean and dry.  Use a clean, soft washcloth to gently wash the stoma and the skin around it.  Use warm water and only use cleansers recommended by your health care provider.  Rinse the stoma area with plain water.  Dry the area well.  Use stoma powder or ointment on your skin only as told by your health care provider. Do not use any other powders, gels, wipes, or creams on your skin.  Change your colostomy bag if your skin becomes irritated. Irritation may indicate that the bag is leaking.  Check your stoma area every day for signs of infection. Check for:  More redness, swelling, or pain.  More fluid or blood.  Pus or warmth.  Measure the stoma opening regularly and record the size. Watch for changes. Share this information with your health care provider. How do I care for my colostomy bag? The bag that fits over the stoma can have either one or two pieces.  One-piece bag: The skin barrier and the bag are combined in a single unit.  Two-piece bag: The skin barrier and the bag are separate pieces that attach to each other. 1.  Empty your bag at bedtime and  whenever it is one-third to one-half full. Do not let more stool or gas build up. This could cause the bag to leak. Some colostomy bags have a built-in gas release valve. Change the bag every 3-4 days or as told by your health care provider. Also change the bag if it is leaking or separating from the skin or your skin looks irritated. How do I empty my colostomy bag? Before you leave the hospital, you will be taught how to empty your bag. Follow these basic steps: 1. Wash your hands with soap and water. 2. Sit far back on the toilet. 3. Put several pieces of toilet paper into the toilet water. This will prevent splashing as you empty the stool into the toilet. 4. Remove the clip or the velcro from the tail end of the bag. 5. Unroll the tail, then empty stool into the toilet. 6.  7. Reroll the tail, and close it with the clip or velcro. 8. Wash your hands again. How do I change my colostomy bag? Before you leave the hospital, you will be taught how to change your bag. Always have colostomy supplies with you, and follow these basic steps: 1. Wash your hands with soap and water. Have paper towels or tissues near you to clean any discharge. 2. Use a template to pre-cut the skin barrier. Smooth any rough edges. 3. If using  a two-piece bag, attach the bag and the skin barrier to each other. Add the barrier ring, if you use one. 4. If your stools are watery, add a few cotton balls to the new bag to absorb the liquid. 5. Remove the old bag and skin barrier. Gently push the skin away from the barrier with your fingers or a warm cloth. 6. Wash your hands again. Then clean the stoma area as directed with water or with mild soap and water. Use water to rinse away any soap. 7. Dry the skin. You may use the cool setting on a hair dryer to do this. 8. If directed, apply stoma powder or skin barrier gel to the skin. 9. Dry the skin again. 10. Warm the skin barrier with your hands or a warm  compress. 11. Remove the paper from the sticky (adhesive) strip of the skin barrier. 12. Press the adhesive strip onto the skin around the stoma. 13. Gently rub the skin barrier onto the skin. This creates heat that helps the barrier to stick. 14. Apply stoma tape to the edges of the skin barrier. What are some general tips?  Avoid wearing clothes that are tight directly over your stoma.  You may shower or bathe with the colostomy bag on or off. Do not use harsh or oily soaps or lotions. Dry the skin and bag after bathing.  Store all supplies in a cool, dry place. Do not leave supplies in extreme heat because parts can melt.  Whenever you leave home, take an extra skin barrier and colostomy bag with you.  If your colostomy bag gets wet, you can dry it with a hair dryer on the cool setting.  To prevent odor, put drops of ostomy deodorizer in the colostomy bag. Your health care provider may also recommend putting ostomy lubricant inside the bag. This helps the stool to slide out of the bag more easily and completely. Contact a health care provider if:  You have more redness, swelling, or pain around your stoma.  You have more fluid or blood coming from your stoma.  Your stoma feels warm to the touch.  You have pus coming from your stoma.  Your stoma extends in or out farther than normal.  You need to change the bag every day.  You have a fever. Get help right away if:  Your stool is bloody.  You vomit.  You have trouble breathing. This information is not intended to replace advice given to you by your health care provider. Make sure you discuss any questions you have with your health care provider. Document Released: 07/14/2003 Document Revised: 11/19/2015 Document Reviewed: 11/13/2013 Elsevier Interactive Patient Education  2017 [place]: POST OP  INSTRUCTIONS  ######################################################################  EAT Gradually transition to a high fiber diet with a fiber supplement over the next few weeks after discharge.  Start with a pureed / full liquid diet (see below)  WALK Walk an hour a day.  Control your pain to do that.    CONTROL PAIN Control pain so that you can walk, sleep, tolerate sneezing/coughing, go up/down stairs.  HAVE A BOWEL MOVEMENT DAILY Keep your bowels regular to avoid problems.  OK to try a laxative to override constipation.  OK to use an antidairrheal to slow down diarrhea.  Call if not better after 2 tries  CALL IF YOU HAVE PROBLEMS/CONCERNS Call if you are still struggling despite following these instructions. Call if you have concerns not answered by these instructions  ######################################################################  1. DIET: Follow a light bland diet the first 24 hours after arrival home, such as soup, liquids, crackers, etc.  Be sure to include lots of fluids daily.  Avoid fast food or heavy meals as your are more likely to get nauseated.  Eat a low fat the next few days after surgery.   2. Take your usually prescribed home medications unless otherwise directed. 3. PAIN CONTROL: a. Pain is best controlled by a usual combination of three different methods TOGETHER: i. Ice/Heat ii. Over the counter pain medication iii. Prescription pain medication b. Most patients will experience some swelling and bruising around the incisions.  Ice packs or heating pads (30-60 minutes up to 6 times a day) will help. Use ice for the first few days to help decrease swelling and bruising, then switch to heat to help relax tight/sore spots and speed recovery.  Some people prefer to use ice alone, heat alone, alternating between ice & heat.  Experiment to what works for you.  Swelling and bruising can take several weeks to resolve.   c. It is helpful to take an  over-the-counter pain medication regularly for the first few weeks.  Choose one of the following that works best for you: i. Naproxen (Aleve, etc)  Two 220mg  tabs twice a day ii. Ibuprofen (Advil, etc) Three 200mg  tabs four times a day (every meal & bedtime) iii. Acetaminophen (Tylenol, etc) 500-650mg  four times a day (every meal & bedtime) d. A  prescription for pain medication (such as oxycodone, hydrocodone, etc) should be given to you upon discharge.  Take your pain medication as prescribed.  i. If you are having problems/concerns with the prescription medicine (does not control pain, nausea, vomiting, rash, itching, etc), please call us [phone] to see if we need to switch you to a different pain medicine that will work better for you and/or control your side effect better. ii. If you need a refill on your pain medication, please contact your pharmacy.  They will contact our office to request authorization. Prescriptions will not be filled after 5 pm or on week-ends. 4. Avoid getting constipated.  Between the surgery and the pain medications, it is common to experience some constipation.  Increasing fluid intake and taking a fiber supplement (such as Metamucil, Citrucel, FiberCon, MiraLax, etc) 1-2 times a day regularly will usually help prevent this problem from occurring.  A mild laxative (prune juice, Milk of Magnesia, MiraLax, etc) should be taken according to package directions if there are no bowel movements after 48 hours.   5. Watch out for diarrhea.  If you have many loose bowel movements, simplify your diet to bland foods & liquids for a few days.  Stop any stool softeners and decrease your fiber supplement.  Switching to mild anti-diarrheal medications (Kayopectate, Pepto Bismol) can help.  If this worsens or does not improve, please call us. 6. Wash / shower every day.  You may shower over the dressings as they are waterproof.  Continue to shower over incision(s) after the dressing is  off. 7. Remove your waterproof bandages 5 days after surgery.  You may leave the incision open to air.  You may replace a dressing/Band-Aid to cover the incision for comfort if you wish.  8. ACTIVITIES as tolerated:   a. You may resume regular (light) daily activities beginning the next day--such as daily self-care, walking, climbing stairs--gradually increasing activities as tolerated.  If you can walk 30 minutes without difficulty, it is safe to try more  intense activity such as jogging, treadmill, bicycling, low-impact aerobics, swimming, etc. b. Save the most intensive and strenuous activity for last such as sit-ups, heavy lifting, contact sports, etc  Refrain from any heavy lifting or straining until you are off narcotics for pain control.   c. DO NOT PUSH THROUGH PAIN.  Let pain be your guide: If it hurts to do something, don't do it.  Pain is your body warning you to avoid that activity for another week until the pain goes down. d. You may drive when you are no longer taking prescription pain medication, you can comfortably wear a seatbelt, and you can safely maneuver your car and apply brakes. e. Dennis Bast may have sexual intercourse when it is comfortable.  9. FOLLOW UP in our office a. Please call [facility] at [phone] to set up an appointment to see your surgeon in the office for a follow-up appointment approximately 2-3 weeks after your surgery. b. Make sure that you call for this appointment the day you arrive home to insure a convenient appointment time. 10. IF YOU HAVE DISABILITY OR FAMILY LEAVE FORMS, BRING THEM TO THE OFFICE FOR PROCESSING.  DO NOT GIVE THEM TO YOUR DOCTOR.   WHEN TO CALL us [phone]: 1. Poor pain control 2. Reactions / problems with new medications (rash/itching, nausea, etc)  3. Fever over 101.5 F (38.5 C) 4. Inability to urinate 5. Nausea and/or vomiting 6. Worsening swelling or bruising 7. Continued bleeding from incision. 8. Increased pain, redness, or  drainage from the incision   The clinic staff is available to answer your questions during regular business hours (8:30am-5pm).  Please dont hesitate to call and ask to speak to one of our nurses for clinical concerns.   If you have a medical emergency, go to the nearest emergency room or call 911.  A surgeon from [facility] is always on call at the [facility], [place], [place], [place], [place]  [address] ? MAIN: [phone] ? TOLL FREE: [phone] ?  FAX [phone] [email or web address]

## 2016-10-20 LAB — COMPREHENSIVE METABOLIC PANEL
ALT: 14 U/L (ref 14–54)
AST: 17 U/L (ref 15–41)
Albumin: 2.2 g/dL — ABNORMAL LOW (ref 3.5–5.0)
Alkaline Phosphatase: 119 U/L (ref 38–126)
Anion gap: 9 (ref 5–15)
BILIRUBIN TOTAL: 0.6 mg/dL (ref 0.3–1.2)
CO2: 27 mmol/L (ref 22–32)
CREATININE: 0.4 mg/dL — AB (ref 0.44–1.00)
Calcium: 8.5 mg/dL — ABNORMAL LOW (ref 8.9–10.3)
Chloride: 96 mmol/L — ABNORMAL LOW (ref 101–111)
GFR calc Af Amer: >60 mL/min (ref >60)
GFR calc non Af Amer: >60 mL/min (ref >60)
Glucose, Bld: 93 mg/dL (ref 65–99)
Potassium: 3.4 mmol/L — ABNORMAL LOW (ref 3.5–5.1)
Sodium: 132 mmol/L — ABNORMAL LOW (ref 135–145)
TOTAL PROTEIN: 5.9 g/dL — AB (ref 6.5–8.1)

## 2016-10-20 LAB — MAGNESIUM: MAGNESIUM: 1.8 mg/dL (ref 1.7–2.4)

## 2016-10-20 MED ORDER — HEPARIN SOD (PORK) LOCK FLUSH 100 UNIT/ML IV SOLN
500.0000 [IU] | INTRAVENOUS | Status: DC | PRN
  Filled 2016-10-20: qty 5

## 2016-10-20 MED ORDER — HEPARIN SOD (PORK) LOCK FLUSH 100 UNIT/ML IV SOLN: 500.0000 [IU] | INTRAVENOUS | Status: DC

## 2016-10-20 NOTE — Consult Note (Signed)
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  Will [name], [facility] PA removed bridge without difficulty this afternoon and I pouches the ostomy immediately following that removal. Patient tolerated procedure well and was medicated with both IV dilaudid and her PO pain medication prior to the procedure. Stomal assessment/size: elliptical opening, 1 and 1/4 inch x 2 inches, flush with abdomen, minor degree of recession Peristomal assessment: intact with mild erythema in the immediate peristomal field (<1cm) circumferentially Treatment options for stomal/peristomal skin:  Skin barrier ring Output: light tan loose stool.  Patient is independent in pouch emptying and in cleaning out the bottom of the pouch prior to reclosing Ostomy pouching: 2pc. 2 and 3/4 inch pouching system with skin barrier ring Education provided: Patient has observed ostomy pouching system change several times, will require assistance as it makes her nauseated.  She can empty and the changes will just be twice weekly.  I have had no opportunities to work with her fiancee, so have provided a booklet with instructions and have arranged for [facility] for that support and continued teaching with the fiancee.  5 skin barriers, 5 pouches, 5 skin barrier rings and a bottle of deodorant are provided to get her started at home; she is established with [employer] for another 2 weeks of pouching supplies and they will assist her with arranging for additional supplies. Enrolled patient in [place] Start Discharge program: Yes [facility] nursing team will continue to follow while in house, and will remain available to this patient, the nursing and medical teams.   Thanks, Tina Flakes, MSN, RN, [place], [name]  Pager# [phone]

## 2016-10-20 NOTE — Discharge Summary (Signed)
Physician Discharge Summary  [name] KXF:[id number] DOB: August 14, 1965 DOA: 10/09/2016  PCP: Reginia Naas, MD  Admit date: 10/09/2016 Discharge date: 10/20/2016  Admitted From: Home Disposition:  Home  Recommendations for Outpatient Follow-up:  1. Follow up with PCP in 1-2 weeks 2. Follow up with Surgery Dr Redmond Pulling in 3 weeks. Information provided by the surgical team  3. Home RN arranged for assistance with ostomy bag change  4. Eat Diet as tolerated  5. Follow up with Dr Burr Medico in 2 weeks for your chemo.  Home Health:RN Equipment/Devices: none  Discharge ConditionL none CODE STATUS: full Diet recommendation: Resume home diet.   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 She stated she had not had a bowel movement in about 10 days despite of taking over-the-counter laxatives. CT of the abdomen and pelvis showed prominent diffuse. 20 oh carcinomatosis with low attenuation lesions. There was also some malignant ascites seen. He was later thought she also has malignant sigmoid colon obstruction secondary to carcinomatosis therefore underwent transverse loop colostomy on 10/14/2006. Over the course of several days her diet was slowly advanced and her ostomy output was monitored. Wound care was consulted and patient was taught how to change her ostomy bag. Her gradually became more controlled with minimal pain medication. She was able to tolerate oral diet on the day of the discharge and bridge was pulled out by the surgical team from her ostomy area. Proper instructions were given to take care of the site and home RN was arranged by case manager for the assistance. During her stay patient was also followed by Dr. Burr Medico from oncology and it was decided she will follow-up outpatient in  2 weeks for further management for colonic cancer. Today she has reached maximum benefit from in hospital stay and is deemed stable to be discharged with outpatient follow-up as noted above.  Discharge Diagnoses:  Principal Problem:   Abdominal pain Active Problems:   Cancer of right colon (HCC)   Hyponatremia   Hypoalbuminemia due to protein-calorie malnutrition (HCC)   UTI (urinary tract infection)   Hyperglycemia   Uncontrolled pain   Peritoneal carcinomatosis (HCC)   Malignant ascites   Malignant neoplasm of colon (HCC)   SBO (small bowel obstruction)  Malignant Colonic obst due to Carcinomatosis. S/p Transverse Loop Colostomy -Secondary to peritoneal carcinomatosis causing extrinsic compression -10/09/2016 CT abdomen and pelvis--progression of air-fluid levels with moderately distended colon. Several local areas of stricturing and wall thickening in the sigmoid colon likely representing a degree of partial obstruction. Small bowel loops with signal areas of wall thickening and edema. -Fluid levels similar to that on prior films as well as CT of 10/09/2016. Contrast had not progressed. Likely unable to do a Gastrografin enema.  -Patient s/p transverse loop colostomy per Dr. Redmond Pulling 10/13/2016.  -continue home dose oxycodone. -Surgery to pull out the bridge today and she can be discharged.  - tolerating diet.  -follow up with outpatient surgery.   Hyponatremia -stable -Likely volume depletion although cannot rule out a degree of SIADH -Serum osmolarity/Urine osmolarity suggests a degree of SIADH -FeNa--1.18%   Peritoneal carcinomatosis -05/30/2016 peritoneal biopsy shows adenocarcinoma with likely colon primary -last chemo 09/29/16 -Appreciate Dr. Burr Medico followup. Currently getting chemo. Follow up outpatient in 2 weeks.  Iron deficiency anemia -managed by Dr. Burr Medico. H&H stable.  Failure to thrive  -  consulted nutrition  Fibromyalgia and depression -Continue home dose  alprazolam  Hypokalemia Replete.  Discharge Instructions   Allergies as of 10/20/2016      Reactions   Latex Hives   Cymbalta [duloxetine Hcl] Rash   Penicillins Rash   Has patient had a PCN reaction causing immediate rash, facial/tongue/throat swelling, SOB or lightheadedness with hypotension: No Has patient had a PCN reaction causing severe rash involving mucus membranes or skin necrosis: No Has patient had a PCN reaction that required hospitalization No Has patient had a PCN reaction occurring within the last 10 years: No If all of the above answers are "NO", then may proceed with Cephalosporin use.      Medication List    STOP taking these medications   ciprofloxacin 500 MG tablet Commonly known as:  CIPRO     TAKE these medications   acetaminophen 500 MG tablet Commonly known as:  TYLENOL Take 1,000 mg by mouth every 6 (six) hours as needed for fever. Reported on 09/24/2015   ALPRAZolam 0.25 MG tablet Commonly known as:  XANAX Take 1-2 tablets (0.25-0.5 mg total) by mouth at bedtime as needed for anxiety or sleep.   bisacodyl 5 MG EC tablet Commonly known as:  DULCOLAX Take 10 mg by mouth daily as needed for moderate constipation.   cobimetinib fumarate 20 MG tablet Commonly known as:  COTELLIC Take 3 tablets (60 mg total) by mouth daily. Take on days 1-21. Repeat every 28 days. Avoid sun exposure.   cyclobenzaprine 10 MG tablet Commonly known as:  FLEXERIL Take 1 tablet (10 mg total) by mouth 3 (three) times daily as needed for muscle spasms.   dexamethasone 4 MG tablet Commonly known as:  DECADRON Take 2 tablets (8 mg total) by mouth daily. Start day after chemo and take for 3 days after each IV chemo   diphenhydrAMINE 25 mg capsule Commonly known as:  BENADRYL Take 25 mg by mouth every 6 (six) hours as needed for sleep.   docusate sodium 100 MG capsule Commonly known as:  COLACE Take 300 mg by mouth daily.   ferrous sulfate 325 (65 FE) MG tablet Take  325 mg by mouth 2 (two) times daily with a meal. Reported on 10/12/2015   lidocaine-prilocaine cream Commonly known as:  EMLA Apply 1 application topically as needed.   magnesium citrate Soln Take 1 Bottle by mouth once.   Oxycodone HCl 10 MG Tabs Take 1 tablet (10 mg total) by mouth every 4 (four) hours.   traMADol 50 MG tablet Commonly known as:  ULTRAM Take 1-2 tablets (50-100 mg total) by mouth every 6 (six) hours as needed.   vitamin B-12 1000 MCG tablet Commonly known as:  CYANOCOBALAMIN Take 500 mcg by mouth daily. Reported on 09/24/2015   vitamin C 500 MG tablet Commonly known as:  ASCORBIC ACID Take 500 mg by mouth daily.      Follow-up Information    Gayland Curry, MD. Call in 3 week(s).   Specialty:  General Surgery Why:  We are working on your appointment, please call to confirm. Contact information: [address] [place] [address] [phone]        [facility] Follow up.   Specialty:  Home Health Services Why:  for home health RN. Contact information: [address] [place] [address] [phone]          Allergies  Allergen Reactions  . Latex Hives  . Cymbalta [Duloxetine Hcl]  Rash  . Penicillins Rash    Has patient had a PCN reaction causing immediate rash, facial/tongue/throat swelling, SOB or lightheadedness with hypotension: No Has patient had a PCN reaction causing severe rash involving mucus membranes or skin necrosis: No Has patient had a PCN reaction that required hospitalization No Has patient had a PCN reaction occurring within the last 10 years: No If all of the above answers are "NO", then may proceed with Cephalosporin use.     Consultations: GI - Dr Paulita Fujita Surgery Dr [place]erly/Dr Redmond Pulling  Procedures/Studies: Dg Chest 2 View  Result Date: 10/09/2016 CLINICAL DATA:  Colon cancer, constipation EXAM: CHEST  2 VIEW COMPARISON:  Chest CT 10/05/2016 FINDINGS: Port with tip in distal SVC. No pleural  fluid, pulmonary edema, or infiltrate. No pneumothorax. Gas-filled loop of colon noted. IMPRESSION: No acute cardiopulmonary process. Electronically Signed   By: Suzy Bouchard M.D.   On: 10/09/2016 16:13   Dg Abd 1 View  Result Date: 10/04/2016 CLINICAL DATA:  Abdominal pain x3 days, history of colon cancer EXAM: ABDOMEN - 1 VIEW COMPARISON:  CT abdomen/pelvis dated 08/17/2016 FINDINGS: Nonspecific but nonobstructive bowel gas pattern. Suture line in the left upper abdomen with surgical clip, likely related to gastric bypass. Suture line in the right lower abdomen. Surgical clip in the left lower abdomen. Visualized osseous structures are within normal limits. IMPRESSION: Unremarkable abdominal radiograph. Electronically Signed   By: Julian Hy M.D.   On: 10/04/2016 13:16   Ct Chest W Contrast  Result Date: 10/05/2016 CLINICAL DATA:  Colon cancer EXAM: CT CHEST, ABDOMEN, AND PELVIS WITH CONTRAST TECHNIQUE: Multidetector CT imaging of the chest, abdomen and pelvis was performed following the standard protocol during bolus administration of intravenous contrast. CONTRAST:  63mL ISOVUE-300 IOPAMIDOL (ISOVUE-300) INJECTION 61%, 155mL ISOVUE-300 IOPAMIDOL (ISOVUE-300) INJECTION 61% COMPARISON:  08/17/2016 FINDINGS: CT CHEST FINDINGS Cardiovascular: The heart size is normal. No pericardial effusion. Aortic atherosclerosis is noted. Mediastinum/Nodes: The trachea appears patent and is midline. Normal appearance of the esophagus. There is no mediastinal or hilar adenopathy. No axillary or supraclavicular adenopathy. Lungs/Pleura: No pleural effusion. There is no airspace consolidation or atelectasis identified. Index nodule within the left upper lobe is stable measuring 5 mm, image 59 of series 6. There is a index nodule within the anterior right upper lobe which measures 4 mm, image 66 of series 6. Previously 3 mm. 3 mm subpleural nodule in the right lower lobe is stable from previous exam.  Musculoskeletal: There are no aggressive lytic or sclerotic bone lesions. CT ABDOMEN PELVIS FINDINGS Hepatobiliary: Scalloping of the liver secondary to peritoneal carcinomatosis. No intraparenchymal liver lesion identified. The gallbladder appears normal. No biliary dilatation. Pancreas: Unremarkable. No pancreatic ductal dilatation or surrounding inflammatory changes. Spleen: Scalloping of the splenic margin secondary to peritoneal carcinomatosis. The appearance is similar to previous exam. Adrenals/Urinary Tract: Normal appearance of the adrenal glands. No kidney mass or hydronephrosis identified. Urinary bladder appears normal. Stomach/Bowel: Previous gastrojejunal bypass. No pathologic dilatation of the small or large bowel loops. Vascular/Lymphatic: No significant vascular findings are present. No enlarged abdominal or pelvic lymph nodes. Reproductive:  Status post hysterectomy Other: Extensive peritoneal carcinomatosis is identified. Index lesion which extends through the ventral abdominal wall hernia measures 4.5 by 2.8 cm, image 64 of series 2. Unchanged from previous exam. Index lesion along the undersurface of the ventral abdominal wall (slightly left of midline measures 3.2 by 7.0 cm, image 78 of series 2. This is increased from 3.2 x 5.0 cm previously. Right lower  quadrant index lesion which is involving the ileo psoas muscle measures 3.9 x 4.0 cm, image 83 of series 2. Previously 4.5 x 4.7 cm. Musculoskeletal: No aggressive lytic or sclerotic bone lesions. IMPRESSION: 1. No significant interval change in extensive peritoneal carcinomatosis with associated scalloping of the liver and spleen. 2. No significant change in multiple pulmonary nodules. Electronically Signed   By: Kerby Moors M.D.   On: 10/05/2016 11:32   Ct Abdomen Pelvis W Contrast  Result Date: 10/09/2016 CLINICAL DATA:  Fever for 5 days. No bowel movement for 10 days. History of colon cancer. EXAM: CT ABDOMEN AND PELVIS WITH  CONTRAST TECHNIQUE: Multidetector CT imaging of the abdomen and pelvis was performed using the standard protocol following bolus administration of intravenous contrast. CONTRAST:  80 ml ISOVUE-300 IOPAMIDOL (ISOVUE-300) INJECTION 61% COMPARISON:  10/05/2016 FINDINGS: Lower chest: Mild dependent changes in the lung bases. Nodule in the right lung base measuring 4 mm diameter. No change since previous study. Additional nodules seen on prior study are not included within the field of view. Coronary artery calcifications. Hepatobiliary: Scalloping of the liver margin consistent with extrinsic compression from diffuse peritoneal metastases. No discrete focal liver lesions identified. Gallbladder and bile ducts are unremarkable. Portal veins are patent. Pancreas: Pancreas is atrophic but appears unremarkable. No inflammatory changes seen. Spleen: Scalloping of the spleen margin consistent with extrinsic compression from diffuse peritoneal metastasis. No discrete focal intrasplenic lesions. Adrenals/Urinary Tract: Right adrenal gland calcification likely representing area of old infection or hemorrhage. No discrete adrenal gland nodules. Kidneys are symmetrical with symmetrical and homogeneous nephrograms. No hydronephrosis or hydroureter. Bladder is decompressed. Stomach/Bowel: Postoperative changes with gastric bypass. There appears to have been a partial right hemicolectomy with an ileal colonic anastomosis. Contrast material is demonstrated in the colon suggesting no evidence of bowel obstruction. Bowel are mostly decompressed, some of the bowel loops demonstrate signal areas of wall thickening and edema. The remaining colon is moderately distended with air-fluid levels, progressing since previous study. In the sigmoid region, there are several focal areas of stricturing and wall thickening. This may be causing a degree of partial obstruction. Etiology of the areas of narrowing/ wall thickening in the small bowel and  colon probably due to serosal implants from the peritoneal carcinomatosis. Superimposed inflammatory process not entirely excluded. Vascular/Lymphatic: Scattered calcification in the aorta. No aneurysm. Mild prominence of retroperitoneal lymph nodes nonspecific but unchanged. Reproductive: Status post hysterectomy. No adnexal masses. Other: Diffuse peritoneal carcinomatosis with low-attenuation mass is demonstrated throughout the peritoneal. There is un ventral abdominal wall hernia with herniation of peritoneal tumor. Loculated fluid collections may be present consistent with some malignant ascites. Appearances are similar to previous study. Tumor deposits are demonstrated in the subcutaneous fat and musculature of the right flank anterior to the iliac spine. Musculoskeletal: No acute or significant osseous findings. IMPRESSION: Prominent diffuse peritoneal carcinomatosis with low-attenuation lesions seen throughout the peritoneal cavity and causing scalloping of the margins of the liver and spleen. Some loculated fluid collections may be present, likely due to malignant ascites. Appearances are similar to previous study. There appears to be partial colectomy with remaining colon distended and filled with contrast material and gas. Contrast material in the colon suggest no evidence of complete obstruction. Segmental areas of wall thickening and edema demonstrated in the small bowel and in the sigmoid colon, likely resulting from serosal implants of carcinomatosis. Superimposed inflammatory process not entirely excluded. Electronically Signed   By: Lucienne Capers M.D.   On: 10/09/2016 23:14  Ct Abdomen Pelvis W Contrast  Result Date: 10/05/2016 CLINICAL DATA:  Colon cancer EXAM: CT CHEST, ABDOMEN, AND PELVIS WITH CONTRAST TECHNIQUE: Multidetector CT imaging of the chest, abdomen and pelvis was performed following the standard protocol during bolus administration of intravenous contrast. CONTRAST:  61mL  ISOVUE-300 IOPAMIDOL (ISOVUE-300) INJECTION 61%, 187mL ISOVUE-300 IOPAMIDOL (ISOVUE-300) INJECTION 61% COMPARISON:  08/17/2016 FINDINGS: CT CHEST FINDINGS Cardiovascular: The heart size is normal. No pericardial effusion. Aortic atherosclerosis is noted. Mediastinum/Nodes: The trachea appears patent and is midline. Normal appearance of the esophagus. There is no mediastinal or hilar adenopathy. No axillary or supraclavicular adenopathy. Lungs/Pleura: No pleural effusion. There is no airspace consolidation or atelectasis identified. Index nodule within the left upper lobe is stable measuring 5 mm, image 59 of series 6. There is a index nodule within the anterior right upper lobe which measures 4 mm, image 66 of series 6. Previously 3 mm. 3 mm subpleural nodule in the right lower lobe is stable from previous exam. Musculoskeletal: There are no aggressive lytic or sclerotic bone lesions. CT ABDOMEN PELVIS FINDINGS Hepatobiliary: Scalloping of the liver secondary to peritoneal carcinomatosis. No intraparenchymal liver lesion identified. The gallbladder appears normal. No biliary dilatation. Pancreas: Unremarkable. No pancreatic ductal dilatation or surrounding inflammatory changes. Spleen: Scalloping of the splenic margin secondary to peritoneal carcinomatosis. The appearance is similar to previous exam. Adrenals/Urinary Tract: Normal appearance of the adrenal glands. No kidney mass or hydronephrosis identified. Urinary bladder appears normal. Stomach/Bowel: Previous gastrojejunal bypass. No pathologic dilatation of the small or large bowel loops. Vascular/Lymphatic: No significant vascular findings are present. No enlarged abdominal or pelvic lymph nodes. Reproductive:  Status post hysterectomy Other: Extensive peritoneal carcinomatosis is identified. Index lesion which extends through the ventral abdominal wall hernia measures 4.5 by 2.8 cm, image 64 of series 2. Unchanged from previous exam. Index lesion along the  undersurface of the ventral abdominal wall (slightly left of midline measures 3.2 by 7.0 cm, image 78 of series 2. This is increased from 3.2 x 5.0 cm previously. Right lower quadrant index lesion which is involving the ileo psoas muscle measures 3.9 x 4.0 cm, image 83 of series 2. Previously 4.5 x 4.7 cm. Musculoskeletal: No aggressive lytic or sclerotic bone lesions. IMPRESSION: 1. No significant interval change in extensive peritoneal carcinomatosis with associated scalloping of the liver and spleen. 2. No significant change in multiple pulmonary nodules. Electronically Signed   By: Kerby Moors M.D.   On: 10/05/2016 11:32   Dg Abd 2 Views  Result Date: 10/13/2016 CLINICAL DATA:  Colon carcinoma with abdominal pain EXAM: ABDOMEN - 2 VIEW COMPARISON:  10/11/2016 FINDINGS: Scattered large and small bowel gas is noted similar to that seen on the prior exam. Air-fluid levels are noted. No free air is seen. The overall appearance is similar to that noted on the prior exam. No bony abnormality is noted. IMPRESSION: Stable colonic distention with a air with air-fluid levels similar to that seen on prior plain film as well as a CT from 10/09/2016 consistent with the given clinical history. Electronically Signed   By: [name] M.D.   On: 10/13/2016 09:19   Dg Abd 2 Views  Result Date: 10/11/2016 CLINICAL DATA:  Abdominal distention. EXAM: ABDOMEN - 2 VIEW COMPARISON:  CT 10/09/2016 . FINDINGS: Right IJ line noted with tip projected over the right atrium. Surgical clips and sutures are noted over the abdomen. Persistent colonic distention noted. Reference is made to prior CT report 10/09/2016 . No free air identified. Basilar  subsegmental atelectasis . IMPRESSION: 1. Right IJ line with tip noted over the right atrium. 2. Persistent colonic distention. Reference is made to prior CT report 10/09/2016. Electronically Signed   By: Marcello Moores  Register   On: 10/11/2016 09:16      Subjective:   Discharge  Exam: Vitals:   10/20/16 0935 10/20/16 1416  BP: (!) 129/92 (!) 131/92  Pulse: (!) 114 (!) 113  Resp: 17 17  Temp: 98.3 F (36.8 C) 98.3 F (36.8 C)   Vitals:   10/19/16 2136 10/20/16 0553 10/20/16 0935 10/20/16 1416  BP: (!) 124/91 125/88 (!) 129/92 (!) 131/92  Pulse: (!) 108 (!) 106 (!) 114 (!) 113  Resp: 16 16 17 17   Temp: 98.4 F (36.9 C) 98 F (36.7 C) 98.3 F (36.8 C) 98.3 F (36.8 C)  TempSrc: Oral Oral Oral Oral  SpO2: 98% 100% 100% 99%  Weight:      Height:        General: Pt is alert, awake, not in acute distress Cardiovascular: RRR, S1/S2 +, no rubs, no gallops Respiratory: CTA bilaterally, no wheezing, no rhonchi Abdominal: Soft, NT, ND, bowel sounds + Extremities: no edema, no cyanosis    The results of significant diagnostics from this hospitalization (including imaging, microbiology, ancillary and laboratory) are listed below for reference.     Microbiology: Recent Results (from the past 240 hour(s))  Surgical pcr screen     Status: None   Collection Time: 10/13/16 10:40 AM  Result Value Ref Range Status   MRSA, PCR NEGATIVE NEGATIVE Final   Staphylococcus aureus NEGATIVE NEGATIVE Final    Comment:        The Xpert SA Assay (FDA approved for NASAL specimens in patients over 47 years of age), is one component of a comprehensive surveillance program.  Test performance has been validated by [facility] for patients greater than or equal to 70 year old. It is not intended to diagnose infection nor to guide or monitor treatment.      Labs: BNP (last 3 results) No results for input(s): BNP in the last 8760 hours. Basic Metabolic Panel:  Recent Labs Lab 10/14/16 0528  10/16/16 0437 10/17/16 0511 10/18/16 0527 10/19/16 0500 10/20/16 0518  NA 135  < > 135 133* 133* 133* 132*  K 3.7  < > 3.8 3.1* 3.6 3.8 3.4*  CL 102  < > 102 100* 99* 96* 96*  CO2 24  < > 24 29 28 28 27   GLUCOSE 90  < > 94 86 91 86 93  BUN <5*  < > <5* <5* <5* <5* <5*   CREATININE 0.44  < > 0.33* 0.38* 0.39* 0.36* 0.40*  CALCIUM 8.1*  < > 8.3* 8.1* 8.5* 8.7* 8.5*  MG 1.9  --  2.0  --   --   --  1.8  < > = values in this interval not displayed. Liver Function Tests:  Recent Labs Lab 10/14/16 0528 10/20/16 0518  AST 17 17  ALT 15 14  ALKPHOS 103 119  BILITOT 0.8 0.6  PROT 5.6* 5.9*  ALBUMIN 2.0* 2.2*   No results for input(s): LIPASE, AMYLASE in the last 168 hours. No results for input(s): AMMONIA in the last 168 hours. CBC:  Recent Labs Lab 10/14/16 0528 10/16/16 0437 10/17/16 0511  WBC 4.7 7.3 7.0  NEUTROABS 3.1 4.5  --   HGB 8.6* 9.2* 8.6*  HCT 26.6* 28.5* 26.4*  MCV 84.2 82.1 84.1  PLT 441* 520* 415*  Cardiac Enzymes: No results for input(s): CKTOTAL, CKMB, CKMBINDEX, TROPONINI in the last 168 hours. BNP: Invalid input(s): POCBNP CBG: No results for input(s): GLUCAP in the last 168 hours. D-Dimer No results for input(s): DDIMER in the last 72 hours. Hgb A1c No results for input(s): HGBA1C in the last 72 hours. Lipid Profile No results for input(s): CHOL, HDL, LDLCALC, TRIG, CHOLHDL, LDLDIRECT in the last 72 hours. Thyroid function studies No results for input(s): TSH, T4TOTAL, T3FREE, THYROIDAB in the last 72 hours.  Invalid input(s): FREET3 Anemia work up No results for input(s): VITAMINB12, FOLATE, FERRITIN, TIBC, IRON, RETICCTPCT in the last 72 hours. Urinalysis    Component Value Date/Time   COLORURINE YELLOW 10/09/2016 1629   APPEARANCEUR HAZY (A) 10/09/2016 1629   LABSPEC 1.023 10/09/2016 1629   LABSPEC 1.010 10/04/2016 1519   PHURINE 8.0 10/09/2016 1629   GLUCOSEU NEGATIVE 10/09/2016 1629   GLUCOSEU Negative 10/04/2016 1519   HGBUR NEGATIVE 10/09/2016 1629   BILIRUBINUR NEGATIVE 10/09/2016 1629   BILIRUBINUR Negative 10/04/2016 1519   KETONESUR 20 (A) 10/09/2016 1629   PROTEINUR 100 (A) 10/09/2016 1629   UROBILINOGEN 0.2 10/04/2016 1519   NITRITE NEGATIVE 10/09/2016 1629   LEUKOCYTESUR NEGATIVE  10/09/2016 1629   LEUKOCYTESUR Negative 10/04/2016 1519   Sepsis Labs Invalid input(s): PROCALCITONIN,  WBC,  LACTICIDVEN Microbiology Recent Results (from the past 240 hour(s))  Surgical pcr screen     Status: None   Collection Time: 10/13/16 10:40 AM  Result Value Ref Range Status   MRSA, PCR NEGATIVE NEGATIVE Final   Staphylococcus aureus NEGATIVE NEGATIVE Final    Comment:        The Xpert SA Assay (FDA approved for NASAL specimens in patients over 33 years of age), is one component of a comprehensive surveillance program.  Test performance has been validated by [facility] for patients greater than or equal to 76 year old. It is not intended to diagnose infection nor to guide or monitor treatment.      Time coordinating discharge: Over 30 minutes  SIGNED:   Damita Lack, MD  [facility] 10/20/2016, 3:19 PM Pager   If 7PM-7AM, please contact night-coverage [email or web address] Password TRH1

## 2016-10-20 NOTE — Progress Notes (Signed)
PROGRESS NOTE    [name]  IHW:[id number] DOB: [birth year] DOA: 10/09/2016 PCP: Reginia Naas, MD   Brief Narrative:  51 year old female with a history of stage IIIB colon cancer with mets to lung and liver and peritoneal carcinomatosis currently on Folfiri and Avastin, anxiety/depression, hyponatremia presenting with worsening abdominal pain. The patient states that she has not had a bowel movement in 10 days. She tried over-the-counter laxatives including magnesium citrate x 5without results. She has had decreased oral intake and increasing fatigue over the past week. The patient was seen at the [facility] center on 10/04/2016 with worsening abdominal pain. At that time, the patient's oxycodone was increased to 10 mg every 4 hours when necessary pain and CT of the abdomen and pelvis was ordered.   Regarding her oncologic history, the patient was diagnosed with colon cancer and underwent right hemicolectomy on 08/03/2015. She was initiated on chemotherapy on 09/18/2015. PET scan imaging on 05/20/2016 revealed hypermetabolic peritoneal carcinomatosis with 5 scattered pulmonary nodules with possible hypermetabolic mass in the right ventral pelvic wall.  Patient was monitored underwent conservative treatment have is no significant improvement with small bowel obstruction. Serial abdominal films were done with no significant improvement. Patient was followed by general surgery and patient for probable surgical intervention 10/13/2016.    Assessment & Plan:   Principal Problem:   Abdominal pain Active Problems:   Cancer of right colon (HCC)   Hyponatremia   Hypoalbuminemia due to protein-calorie malnutrition (HCC)   UTI (urinary tract infection)   Hyperglycemia   Uncontrolled pain   Peritoneal carcinomatosis (HCC)   Malignant ascites   Malignant neoplasm of colon (HCC)   SBO (small bowel obstruction)   Intractable abdominal pain/uncontrolled pain -Secondary to peritoneal  carcinomatosis causing extrinsic compression -10/09/2016 CT abdomen and pelvis--progression of air-fluid levels with moderately distended colon. Several local areas of stricturing and wall thickening in the sigmoid colon likely representing a degree of partial obstruction. Small bowel loops with signal areas of wall thickening and edema. -appreciate general surgery-->enema with minimal success -appreciate MedOnc -consult [facility]-->?possible colonic stent however per GI no strong likelihood of success with stenting.  -Fluid levels similar to that on prior films as well as CT of 10/09/2016. Contrast had not progressed. Likely unable to do a Gastrografin enema.  -Patient s/p transverse loop colostomy per Dr. Redmond Pulling 10/13/2016.  -continue home dose oxycodone. -Surgery to pull out the bridge today and she can be discharged.  - Adv diet as tolerated, wound care team following.  -follow up with outpatient surgery.   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 Burr Medico followup. Currently getting chemo. Follow up outpatient in 2 weeks.  Iron deficiency anemia -managed by Dr. Burr Medico. H&H stable.  Failure to thrive  -consulted nutrition  Fibromyalgia and depression -Continue home dose alprazolam  Hypokalemia Replete.  Home RN arrangements to be made by the CM, she is aware. Appreciate help.    DVT prophylaxis: Lovenox Code Status: Full. Family Communication: Patient comprehends well.  Disposition Plan: Discharge home today   Consultants:   Gen. surgery: Dr. Redmond Pulling 10/10/2016  Gastroenterology: Dr. Paulita Fujita  Oncology: Dr Burr Medico  Procedures:   CT abdomen and pelvis 10/09/2016  Abdominal xray 10/11/2016,     CXR 10/09/2016  Transverse loop colostomy per Dr. Redmond Pulling 10/13/2016  Antimicrobials:  None   Subjective: No complaints this morning. Tolerating her po diet. She has learnt how to change her ostomy bag but still needs home Rn for assistance.   Objective: Vitals:   10/19/16 2136 10/20/16 0553 10/20/16 0935 10/20/16 1416  BP: (!) 124/91 125/88 (!) 129/92 (!) 131/92  Pulse: (!) 108 (!) 106 (!) 114 (!) 113  Resp: 16 16 17 17   Temp: 98.4 F (36.9 C) 98 F (36.7 C) 98.3 F (36.8 C) 98.3 F (36.8 C)  TempSrc: Oral Oral Oral Oral  SpO2: 98% 100% 100% 99%  Weight:      Height:        Intake/Output Summary (Last 24 hours) at 10/20/16 1432 Last data filed at 10/20/16 0500  Gross per 24 hour  Intake             1740 ml  Output              225 ml  Net             1515 ml   Filed Weights   10/09/16 1523 10/10/16 0153  Weight: 53.1 kg (117 lb) 56 kg (123 lb 7.3 oz)    Examination:  General exam: Appears calm and comfortable  Respiratory system: Clear to auscultation. Respiratory effort normal. Cardiovascular system: S1 & S2 heard, RRR. No JVD, murmurs, rubs, gallops or clicks. No pedal edema. Gastrointestinal system: Abdomen is less distended, soft and some tenderness to palpation but much improved. No organomegaly or masses felt. Hyperactive bowel sounds.Ostomy bag intact with loose stool and gas. Central nervous system: Alert and oriented. No focal neurological deficits. Extremities: Symmetric 5 x 5 power. Skin: No rashes, lesions or ulcers Psychiatry: Judgement and insight appear normal. Mood & affect appropriate.     Data Reviewed: I have personally reviewed following labs and imaging studies  CBC:  Recent Labs Lab 10/14/16 0528 10/16/16 0437 10/17/16 0511  WBC 4.7 7.3 7.0  NEUTROABS 3.1 4.5  --   HGB 8.6* 9.2* 8.6*  HCT 26.6* 28.5* 26.4*  MCV 84.2 82.1 84.1  PLT 441* 520* 825*   Basic Metabolic Panel:  Recent Labs Lab 10/14/16 0528  10/16/16 0437  10/17/16 0511 10/18/16 0527 10/19/16 0500 10/20/16 0518  NA 135  < > 135 133* 133* 133* 132*  K 3.7  < > 3.8 3.1* 3.6 3.8 3.4*  CL 102  < > 102 100* 99* 96* 96*  CO2 24  < > 24 29 28 28 27   GLUCOSE 90  < > 94 86 91 86 93  BUN <5*  < > <5* <5* <5* <5* <5*  CREATININE 0.44  < > 0.33* 0.38* 0.39* 0.36* 0.40*  CALCIUM 8.1*  < > 8.3* 8.1* 8.5* 8.7* 8.5*  MG 1.9  --  2.0  --   --   --  1.8  < > = values in this interval not displayed. GFR: Estimated Creatinine Clearance: 64.5 mL/min (A) (by C-G formula based on SCr of 0.4 mg/dL (L)). Liver Function Tests:  Recent Labs Lab 10/14/16 0528 10/20/16 0518  AST 17 17  ALT 15 14  ALKPHOS 103 119  BILITOT 0.8 0.6  PROT 5.6* 5.9*  ALBUMIN 2.0* 2.2*   No results for input(s): LIPASE, AMYLASE in the last 168 hours. No results for input(s): AMMONIA in the last 168 hours. Coagulation Profile: No results for input(s): INR, PROTIME in the last 168 hours. Cardiac Enzymes: No results for input(s): CKTOTAL, CKMB, CKMBINDEX, TROPONINI in the last 168  hours. BNP (last 3 results) No results for input(s): PROBNP in the last 8760 hours. HbA1C: No results for input(s): HGBA1C in the last 72 hours. CBG: No results for input(s): GLUCAP in the last 168 hours. Lipid Profile: No results for input(s): CHOL, HDL, LDLCALC, TRIG, CHOLHDL, LDLDIRECT in the last 72 hours. Thyroid Function Tests: No results for input(s): TSH, T4TOTAL, FREET4, T3FREE, THYROIDAB in the last 72 hours. Anemia Panel: No results for input(s): VITAMINB12, FOLATE, FERRITIN, TIBC, IRON, RETICCTPCT in the last 72 hours. Sepsis Labs: No results for input(s): PROCALCITON, LATICACIDVEN in the last 168 hours.  Recent Results (from the past 240 hour(s))  Surgical pcr screen     Status: None   Collection Time: 10/13/16 10:40 AM  Result Value Ref Range Status   MRSA, PCR NEGATIVE NEGATIVE Final   Staphylococcus aureus NEGATIVE NEGATIVE Final    Comment:        The Xpert SA Assay  (FDA approved for NASAL specimens in patients over 83 years of age), is one component of a comprehensive surveillance program.  Test performance has been validated by [facility] for patients greater than or equal to 53 year old. It is not intended to diagnose infection nor to guide or monitor treatment.          Radiology Studies: No results found.      Scheduled Meds: . cyanocobalamin  1,000 mcg Intramuscular Weekly  . enoxaparin (LOVENOX) injection  40 mg Subcutaneous QHS  . methocarbamol (ROBAXIN)  IV  500 mg Intravenous Q8H  . oxyCODONE  10 mg Oral Q4H  . senna-docusate  1 tablet Oral BID   Continuous Infusions:    LOS: 10 days    Time spent: 40 mins    Ankit Arsenio Loader, MD [facility] Pager [phone]  If 7PM-7AM, please contact night-coverage [email or web address] Password [facility] 10/20/2016, 2:32 PM

## 2016-10-21 ENCOUNTER — Telehealth: Payer: Self-pay | Admitting: Pharmacist

## 2016-10-21 DIAGNOSIS — C182 Malignant neoplasm of ascending colon: Secondary | ICD-10-CM

## 2016-10-21 MED ORDER — COBIMETINIB FUMARATE 20 MG PO TABS: 60.0000 mg | ORAL_TABLET | Freq: Every day | ORAL | 0 refills | Status: DC

## 2016-10-21 NOTE — Telephone Encounter (Signed)
Oral Chemotherapy Pharmacist Encounter  Received new prescription for Cotellic to be used in combination with [facility] for colon cancer.  Cmet from 3/29 and CBC from 3/26 reviewed, OK for treatment  No baseline CPK ordered, no baseline LVEF assessment performed  Per manufacturer recommendation, assess LVEF (by ECHO or MUGA scan) at baseline, 1 month after initiation, and Q 3 months until cobimetinib is discontinued. Baseline ECHO will be discussed with MD  Due to risk of rhabdomyolysis, assess CPK at baseline, periodically throughout treatment, and as clinically indicated. Baseline CPK ordered fr patient's next appointment on 11/03/16  Current medication list in Epic assessed, no significant DDIs with Cotellic identified.  Prescription will be sent to [facility] for benefits analysis.  Oral Oncology Clinic will continue to follow.  Johny Drilling, PharmD, BCPS, BCOP 10/21/2016  4:32 PM Oral Oncology Clinic [phone]

## 2016-10-24 ENCOUNTER — Other Ambulatory Visit: Payer: Self-pay | Admitting: Hematology

## 2016-10-24 ENCOUNTER — Telehealth: Payer: Self-pay

## 2016-10-24 DIAGNOSIS — C182 Malignant neoplasm of ascending colon: Secondary | ICD-10-CM

## 2016-10-24 NOTE — Telephone Encounter (Signed)
[name] with [facility] called asking for order for continued care, 1x/wk for the first partial week, 2x/wk for 8 weeks, then 2 visits prn. This is for ostomy care, medications and pain control. Verbal order given per Dr Burr Medico.

## 2016-10-24 NOTE — Anesthesia Postprocedure Evaluation (Signed)
Anesthesia Post Note  Patient: [name]  Procedure(s) Performed: Procedure(s) (LRB): left upper quadrant LOOP COLOSTOMY (N/A)  Patient location during evaluation: PACU Anesthesia Type: General Level of consciousness: awake and alert and patient cooperative Pain management: pain level controlled Vital Signs Assessment: post-procedure vital signs reviewed and stable Respiratory status: spontaneous breathing and respiratory function stable Cardiovascular status: stable Anesthetic complications: no       Last Vitals:  Vitals:   10/20/16 1416 10/20/16 1831  BP: (!) 131/92 139/86  Pulse: (!) 113 (!) 117  Resp: 17 18  Temp: 36.8 C 36.9 C    Last Pain:  Vitals:   10/20/16 1831  TempSrc: Oral  PainSc:                  Lind[place] S

## 2016-10-25 ENCOUNTER — Telehealth: Payer: Self-pay | Admitting: Hematology

## 2016-10-25 NOTE — Telephone Encounter (Signed)
Spoke with Audry Pili re appointments for 4/6. Echo to managed care for pre auth.

## 2016-10-28 ENCOUNTER — Telehealth: Payer: Self-pay

## 2016-10-28 ENCOUNTER — Other Ambulatory Visit: Payer: 59

## 2016-10-28 ENCOUNTER — Ambulatory Visit: Payer: 59 | Admitting: Hematology

## 2016-10-28 NOTE — Telephone Encounter (Addendum)
Per response from managed care no preauth required for echo ([id number]) EQF#[id number]. Spoke with Audry Pili re echo 4/12 @ 10 am and confirmed 4/12 appointments at [facility] for 4/12 @ 1:15 pm.

## 2016-10-28 NOTE — Telephone Encounter (Signed)
Pt called stating she is cancelling today's appt and asked for r/s to next week Called pt back she has an appt 4/12 for lab/flush/MD/infusion. She states she is still weak and nauseated. She is recuperating from her colostomy surgery.she states she has medicine for her nausea.

## 2016-10-28 NOTE — Telephone Encounter (Signed)
Oral Chemotherapy Pharmacist Encounter  Prior authorization submitted on CoverMyMeds Key [id number] Status is pending  Oral Oncology Clinic will continue to follow  Johny Drilling, PharmD, BCPS, BCOP 10/28/2016  4:46 PM Oral Oncology Clinic [phone]

## 2016-11-02 ENCOUNTER — Ambulatory Visit: Payer: 59

## 2016-11-02 ENCOUNTER — Ambulatory Visit: Payer: 59 | Admitting: Hematology

## 2016-11-02 ENCOUNTER — Other Ambulatory Visit: Payer: 59

## 2016-11-02 NOTE — Telephone Encounter (Signed)
Oral Chemotherapy Pharmacist Encounter  Received notification from [place] that prior authorization for [place] has been DENIED due to medication being used off of FDA labeling  Letter of appeal, MD office notes, and copy of ESMO abstract LBA-01 (June 2016) showing efficacy of PDL-1 inhibitor plus MEK inhibition for MSI-stable colorectal cancer faxed to [place] Department at [phone]. Marked for an urgent appeal.  I LVM for patient on her cell number with a brief update on status and request to see her at MD appointments tomorrow (1/19) to sign application for manufacturer assistance. If we receive a denial from the appeal to [place] for the [place] we will be able to try to enroll patient in manufacturer patient assistance program.  Oral oncology Clinic will continue to follow.  Johny Drilling, PharmD, BCPS, BCOP 11/02/2016  10:41 AM Oral Oncology Clinic [phone]

## 2016-11-03 ENCOUNTER — Telehealth: Payer: Self-pay | Admitting: Hematology

## 2016-11-03 ENCOUNTER — Other Ambulatory Visit: Payer: 59

## 2016-11-03 ENCOUNTER — Ambulatory Visit (HOSPITAL_COMMUNITY)
Admission: RE | Admit: 2016-11-03 | Discharge: 2016-11-03 | Disposition: A | Discharge status: Home / Self Care | Payer: 59 | Source: Ambulatory Visit | Attending: Hematology | Admitting: Hematology

## 2016-11-03 ENCOUNTER — Ambulatory Visit: Payer: 59

## 2016-11-03 ENCOUNTER — Ambulatory Visit: Payer: 59 | Admitting: Hematology

## 2016-11-03 ENCOUNTER — Telehealth: Payer: Self-pay | Admitting: *Deleted

## 2016-11-03 DIAGNOSIS — C182 Malignant neoplasm of ascending colon: Secondary | ICD-10-CM | POA: Insufficient documentation

## 2016-11-03 DIAGNOSIS — I348 Other nonrheumatic mitral valve disorders: Secondary | ICD-10-CM | POA: Insufficient documentation

## 2016-11-03 NOTE — Progress Notes (Signed)
  Echocardiogram 2D Echocardiogram has been performed. During first half of exam, patient stated she was unable to lay in left lateral decubitus position; however, throughout the exam, I asked her if she could try and I was able to obtain more accurate images.    [name] 11/03/2016, 10:46 AM

## 2016-11-03 NOTE — Telephone Encounter (Signed)
lvm and sw pt husband to confirm r/s appt to 4/13 at 0915 per LOS

## 2016-11-03 NOTE — Telephone Encounter (Signed)
"  I am scheduled today beginning at 1:15 pm.  I will not be able to come in and need to reschedule.  I have an emergency.  My colostomy bag broke.  I've not had a colostomy very long so I called home health who is finding a nurse to come and fix this.  I will not be able to make it in because I'm waiting for the nurse.  Let Dr. Burr Medico know to reschedule these appointments.   Return number [phone].  I have supplies but I need a nurse." Will notify provider.

## 2016-11-03 NOTE — Telephone Encounter (Signed)
Oral Chemotherapy Pharmacist Encounter  Noted patient rescheduling MD appointment for today. I called patient to update her on process to obtain her medication. I will need to meet with patient next time she is in the office to sign manufacturer assistance application. Patient expressed understanding and appreciation.  Oral Oncology Clinic will continue to follow.  Johny Drilling, PharmD, BCPS, BCOP 11/03/2016  3:27 PM Oral Oncology Clinic [phone]

## 2016-11-04 ENCOUNTER — Encounter: Payer: Self-pay | Admitting: Hematology

## 2016-11-04 ENCOUNTER — Other Ambulatory Visit (HOSPITAL_BASED_OUTPATIENT_CLINIC_OR_DEPARTMENT_OTHER): Payer: 59

## 2016-11-04 ENCOUNTER — Ambulatory Visit: Payer: 59

## 2016-11-04 ENCOUNTER — Ambulatory Visit (HOSPITAL_BASED_OUTPATIENT_CLINIC_OR_DEPARTMENT_OTHER): Payer: 59 | Admitting: Hematology

## 2016-11-04 VITALS — BP 112/79 | HR 108 | Temp 98.4°F | Resp 18 | Ht 60.0 in | Wt 108.6 lb

## 2016-11-04 DIAGNOSIS — C182 Malignant neoplasm of ascending colon: Secondary | ICD-10-CM

## 2016-11-04 DIAGNOSIS — D509 Iron deficiency anemia, unspecified: Secondary | ICD-10-CM

## 2016-11-04 DIAGNOSIS — Z95828 Presence of other vascular implants and grafts: Secondary | ICD-10-CM

## 2016-11-04 DIAGNOSIS — K5900 Constipation, unspecified: Secondary | ICD-10-CM | POA: Diagnosis not present

## 2016-11-04 DIAGNOSIS — F329 Major depressive disorder, single episode, unspecified: Secondary | ICD-10-CM

## 2016-11-04 DIAGNOSIS — Z7189 Other specified counseling: Secondary | ICD-10-CM

## 2016-11-04 DIAGNOSIS — D500 Iron deficiency anemia secondary to blood loss (chronic): Secondary | ICD-10-CM

## 2016-11-04 DIAGNOSIS — R5383 Other fatigue: Secondary | ICD-10-CM

## 2016-11-04 DIAGNOSIS — R109 Unspecified abdominal pain: Secondary | ICD-10-CM | POA: Diagnosis not present

## 2016-11-04 DIAGNOSIS — M791 Myalgia: Secondary | ICD-10-CM | POA: Diagnosis not present

## 2016-11-04 LAB — CBC WITH DIFFERENTIAL/PLATELET
BASO%: 0.4 % (ref 0.0–2.0)
BASOS ABS: 0 10*3/uL (ref 0.0–0.1)
EOS ABS: 0.1 10*3/uL (ref 0.0–0.5)
EOS%: 1.3 % (ref 0.0–7.0)
HCT: 28.4 % — ABNORMAL LOW (ref 34.8–46.6)
HEMOGLOBIN: 8.7 g/dL — AB (ref 11.6–15.9)
LYMPH#: 1.8 10*3/uL (ref 0.9–3.3)
LYMPH%: 18 % (ref 14.0–49.7)
MCH: 25.7 pg (ref 25.1–34.0)
MCHC: 30.6 g/dL — ABNORMAL LOW (ref 31.5–36.0)
MCV: 83.8 fL (ref 79.5–101.0)
MONO#: 0.7 10*3/uL (ref 0.1–0.9)
MONO%: 7.4 % (ref 0.0–14.0)
NEUT%: 72.9 % (ref 38.4–76.8)
NEUTROS ABS: 7.3 10*3/uL — AB (ref 1.5–6.5)
NRBC: 0 % (ref 0–0)
Platelets: 441 10*3/uL — ABNORMAL HIGH (ref 145–400)
RBC: 3.39 10*6/uL — ABNORMAL LOW (ref 3.70–5.45)
RDW: 16 % — ABNORMAL HIGH (ref 11.2–14.5)
WBC: 10 10*3/uL (ref 3.9–10.3)

## 2016-11-04 LAB — COMPREHENSIVE METABOLIC PANEL
ALBUMIN: 2.2 g/dL — AB (ref 3.5–5.0)
ALT: 9 U/L (ref 0–55)
AST: 11 U/L (ref 5–34)
Alkaline Phosphatase: 127 U/L (ref 40–150)
Anion Gap: 7 mEq/L (ref 3–11)
BUN: 6.2 mg/dL — AB (ref 7.0–26.0)
CHLORIDE: 101 meq/L (ref 98–109)
CO2: 26 meq/L (ref 22–29)
Calcium: 9.4 mg/dL (ref 8.4–10.4)
Creatinine: 0.5 mg/dL — ABNORMAL LOW (ref 0.6–1.1)
EGFR: >90 mL/min/{1.73_m2} (ref >90)
GLUCOSE: 86 mg/dL (ref 70–140)
POTASSIUM: 4.4 meq/L (ref 3.5–5.1)
SODIUM: 134 meq/L — AB (ref 136–145)
Total Bilirubin: <0.22 mg/dL (ref 0.20–1.20)
Total Protein: 6.8 g/dL (ref 6.4–8.3)

## 2016-11-04 LAB — FERRITIN: FERRITIN: 401 ng/mL — AB (ref 9–269)

## 2016-11-04 LAB — IRON AND TIBC
%SAT: 8 % — AB (ref 21–57)
Iron: 14 ug/dL — ABNORMAL LOW (ref 41–142)
TIBC: 174 ug/dL — AB (ref 236–444)
UIBC: 160 ug/dL (ref 120–384)

## 2016-11-04 LAB — TSH: TSH: 1.286 m(IU)/L (ref 0.308–3.960)

## 2016-11-04 MED ORDER — HEPARIN SOD (PORK) LOCK FLUSH 100 UNIT/ML IV SOLN
500.0000 [IU] | INTRAVENOUS | Status: DC | PRN
  Administered 2016-11-04: 500 [IU] via INTRAVENOUS
  Filled 2016-11-04: qty 5

## 2016-11-04 MED ORDER — SODIUM CHLORIDE 0.9 % IJ SOLN
10.0000 mL | INTRAMUSCULAR | Status: DC | PRN
  Administered 2016-11-04: 10 mL via INTRAVENOUS
  Filled 2016-11-04: qty 10

## 2016-11-04 MED ORDER — ALPRAZOLAM 0.25 MG PO TABS
0.2500 mg | ORAL_TABLET | Freq: Every evening | ORAL | 0 refills | Status: DC | PRN
Start: 2016-11-04 — End: 2016-12-02

## 2016-11-04 MED ORDER — OXYCODONE HCL 10 MG PO TABS: 10.0000 mg | ORAL_TABLET | ORAL | 0 refills | Status: DC

## 2016-11-04 NOTE — Patient Instructions (Signed)
Implanted Port Home Guide An implanted port is a type of central line that is placed under the skin. Central lines are used to provide IV access when treatment or nutrition needs to be given through a person's veins. Implanted ports are used for long-term IV access. An implanted port may be placed because:  You need IV medicine that would be irritating to the small veins in your hands or arms.  You need long-term IV medicines, such as antibiotics.  You need IV nutrition for a long period.  You need frequent blood draws for lab tests.  You need dialysis.  Implanted ports are usually placed in the chest area, but they can also be placed in the upper arm, the abdomen, or the leg. An implanted port has two main parts:  Reservoir. The reservoir is round and will appear as a small, raised area under your skin. The reservoir is the part where a needle is inserted to give medicines or draw blood.  Catheter. The catheter is a thin, flexible tube that extends from the reservoir. The catheter is placed into a large vein. Medicine that is inserted into the reservoir goes into the catheter and then into the vein.  How will I care for my incision site? Do not get the incision site wet. Bathe or shower as directed by your health care provider. How is my port accessed? Special steps must be taken to access the port:  Before the port is accessed, a numbing cream can be placed on the skin. This helps numb the skin over the port site.  Your health care provider uses a sterile technique to access the port. ? Your health care provider must put on a mask and sterile gloves. ? The skin over your port is cleaned carefully with an antiseptic and allowed to dry. ? The port is gently pinched between sterile gloves, and a needle is inserted into the port.  Only "non-coring" port needles should be used to access the port. Once the port is accessed, a blood return should be checked. This helps ensure that the port  is in the vein and is not clogged.  If your port needs to remain accessed for a constant infusion, a clear (transparent) bandage will be placed over the needle site. The bandage and needle will need to be changed every week, or as directed by your health care provider.  Keep the bandage covering the needle clean and dry. Do not get it wet. Follow your health care provider's instructions on how to take a shower or bath while the port is accessed.  If your port does not need to stay accessed, no bandage is needed over the port.  What is flushing? Flushing helps keep the port from getting clogged. Follow your health care provider's instructions on how and when to flush the port. Ports are usually flushed with saline solution or a medicine called heparin. The need for flushing will depend on how the port is used.  If the port is used for intermittent medicines or blood draws, the port will need to be flushed: ? After medicines have been given. ? After blood has been drawn. ? As part of routine maintenance.  If a constant infusion is running, the port may not need to be flushed.  How long will my port stay implanted? The port can stay in for as long as your health care provider thinks it is needed. When it is time for the port to come out, surgery will be   done to remove it. The procedure is similar to the one performed when the port was put in. When should I seek immediate medical care? When you have an implanted port, you should seek immediate medical care if:  You notice a bad smell coming from the incision site.  You have swelling, redness, or drainage at the incision site.  You have more swelling or pain at the port site or the surrounding area.  You have a fever that is not controlled with medicine.  This information is not intended to replace advice given to you by your health care provider. Make sure you discuss any questions you have with your health care provider. Document  Released: 07/11/2005 Document Revised: 12/17/2015 Document Reviewed: 03/18/2013 Elsevier Interactive Patient Education  2017 [employer]  

## 2016-11-04 NOTE — Progress Notes (Signed)
[place]  [phone] [phone]  Clinic follow up Note   Patient Care Team: Carol Ada, MD as PCP - General (Family Medicine) Rolm Bookbinder, MD as Consulting Physician (General Surgery) 11/04/2016   CHIEF COMPLAINTS:  Follow up metastatic colon cancer   Oncology History   Cancer of right colon [facility])   Staging form: Colon and Rectum, AJCC 7th Edition     Pathologic stage from 08/03/2015: Stage IIIB (T4a, N1a, cM0) - Signed by Truitt Merle, MD on 09/04/2015       Cancer of right colon ([place])   03/31/2015 Imaging    CT abdomen showed appendicitis, perforated, with periappendiceal abscess. And small pulmonary nodules.      04/03/2015 Procedure    CT guided placement of going Through into the right lower abdominal quadrant with aspiration of a total of 30 mL prudent fluid.       08/03/2015 Initial Diagnosis    Cancer of right colon ([place])      08/03/2015 Surgery    Right hemicolectomy      08/03/2015 Pathology Results    Right hemicolectomy showed invasive adenocarcinoma, moderately differentiated, likely arising from a cecal valve. One out of 40 lymph nodes were positive, (+) LVI, margins negative       08/03/2015 Miscellaneous    colon cance MSI-stable       09/18/2015 - 03/31/2016 Chemotherapy    Adjuvant CAPOX, she tolerated oxaliplatin poorly, which was held from cycle 2 to 4 due to poor tolerance, restarted from cycle 5 with lower dose, she also had significant hand-foot syndrome from Xeloda. She completed a total of 8 cycles.      05/20/2016 Imaging    PET scan revealed hypermetabolic peritoneal carcinomatosis, five scatted pulmonary nodules in both lungs, largest 4 mm, metastasis not excluded. Hypermetabolic mass in the deep subcutaneous ventral right lateral pelvic wall, probable metastasis.      06/02/2016 - 09/29/2016 Chemotherapy    FOLFIRI every 2 weeks, started on 06/02/2016, Avastin added from cycle 3. Held due to the  patient hospitalized for a colonic obstruction on 10/09/16.       06/26/2016 Miscellaneous    Foundation One revealed KRAS mutation (+), MSI-stable       08/17/2016 Imaging    CT CAP IMPRESSION: 1. Interval progression of peritoneal carcinomatosis with progressive scalloping of the liver and increase in size of the peritoneal masses. 2. Multiple pulmonary nodules are again noted. All these remain stable with the exception of a left upper lobe nodule which has increased to 5 mm from 3 mm previously.      10/09/2016 - 10/20/2016 Hospital Admission    The patient presented to hospital with constipation and abdominal pain. She had malignant colonic obstruction due to carcinomatosis.      10/09/2016 Imaging     CT AP W CONTRAST IMPRESSION: Prominent diffuse peritoneal carcinomatosis with low-attenuation lesions seen throughout the peritoneal cavity and causing scalloping of the margins of the liver and spleen. Some loculated fluid collections may be present, likely due to malignant ascites. Appearances are similar to previous study.  There appears to be partial colectomy with remaining colon distended and filled with contrast material and gas. Contrast material in the colon suggest no evidence of complete obstruction. Segmental areas of wall thickening and edema demonstrated in the small bowel and in the sigmoid colon, likely resulting from serosal implants of carcinomatosis. Superimposed inflammatory process not entirely excluded.      10/13/2016 Surgery  Patient s/p transverse loop colostomy per Dr. Redmond Pulling 10/13/2016.       11/03/2016 Echocardiogram    Echocardiogram 11/03/16 Left ventricle: The cavity size was normal. Wall thickness was   normal. Systolic function was normal. The estimated ejection   fraction was in the range of 55% to 60%. Mildly reduced GLPSS at   -17%. Wall motion was normal; there were no regional wall motion   abnormalities. The study is not  technically sufficient to allow   evaluation of LV diastolic function.       HISTORY OF PRESENTING ILLNESS:  [name] 51 y.o. female without significant PMH is here because of her recently diagnosed stage III colon cancer. She is accompanied by her boyfriend to the clinic today.  She developed fever and headache in September 2016, was found to have ruptured appendix and was admitted to hospital. CT scan revealed a peri-appendiceal abscess. She underwent abscess drainage tube basement by interventional radiology on 04/17/2015, and a course of antibiotics. She had a multiple drainage tube exchange by IR, but the abscess did not heal. She was referred to Dr. Donne [name]. Multiple CT scan in October and November showed persistent peri-appendiceal fluid collection. She was finally taken to the operation room on 08/03/2015. Intraoperatively, she was found to have a 2 cm whole present in the cecum, she underwent ileocectomy with an anastomosis in the hepatic fixture. 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  She had wound infection issue after surgery. It has been healing slowly, she is still on wet-to-dry dressing change twice daily, has home care nurse. She has moderate pain, 6-7/10, sometime postional, she is on oxycodone 2-3 times a day. Her appetite and eating is getting better, lost about 25lb since 03/2015, no fever and chills for the past 3-4 days, just finished abx yesterday.   CURRENT THERAPY: Pending chemotherapy Atezolitumab (immunotherapy) and Cobimetinib (MEK inhibitor)  INTERIM HISTORY:  [name] returns for follow-up.The patient was admitted to the hospital on 10/09/16 for colonic obstruction. She had a transverse loop colostomy on 10/13/16 by Dr. Redmond Pulling. The patient is working on getting her appetite back up. She has burning and itching of her surgery site from her transverse loop colostomy. She empties the bag BID. The stool is formed, but changes on how much liquid she  digests. She has fatigue. She has not gone back to work yet.  MEDICAL HISTORY:  Past Medical History:  Diagnosis Date  . Anemia   . Anxiety   . Cancer ([place])    colon cancer stage 3  . Constipation   . Depression   . Fibromyalgia   . GERD (gastroesophageal reflux disease)   . JP drain bleeding     SURGICAL HISTORY: Past Surgical History:  Procedure Laterality Date  . ABDOMINAL HYSTERECTOMY    . COLON SURGERY  08/03/2015  . COLOSTOMY REVISION N/A 08/03/2015   Procedure: COLON RESECTION RIGHT;  Surgeon: Rolm Bookbinder, MD;  Location: [place];  Service: General;  Laterality: N/A;  . GASTRIC BYPASS    . IR GENERIC HISTORICAL  05/30/2016   IR FLUORO GUIDE PORT INSERTION RIGHT 05/30/2016 Aletta Edouard, MD [facility]-INTER[facility]  . IR GENERIC HISTORICAL  05/30/2016   IR US GUIDE VASC ACCESS RIGHT 05/30/2016 Aletta Edouard, MD [facility]INTER[facility]  . LAPAROSCOPIC ILEOCECECTOMY Right 08/03/2015   Procedure: LAPAROSCOPIC DIAGNOSTIC RIGHT COLECTOMY;  Surgeon: Rolm Bookbinder, MD;  Location: [place];  Service: General;  Laterality: Right;  . LAPAROSCOPY N/A 10/13/2016   Procedure: left upper  quadrant LOOP COLOSTOMY;  Surgeon: Greer Pickerel, MD;  Location: [facility];  Service: General;  Laterality: N/A;    SOCIAL HISTORY: Social History   Social History  . Marital status: Significant Other    Spouse name: N/A  . Number of children: N/A  . Years of education: N/A   Occupational History  . underwriter support specialist    Social History Main Topics  . Smoking status: Never Smoker  . Smokeless tobacco: Never Used  . Alcohol use 1.2 oz/week    2 Glasses of wine per week     Comment: moderate 1-2 times a month   . Drug use: No  . Sexual activity: Not on file   Other Topics Concern  . Not on file   Social History Narrative   Single, fiance [name]   Works [employer] 11:30am to 8 pm    FAMILY HISTORY: Family History  Problem Relation Age of Onset  . Cancer Father 69    small cell  lung cancer and colon cancer   . Cancer Sister 50    small cell lung cancer  . Cancer Maternal Aunt     ovarian cancer  . Cancer Paternal Uncle     brain cancer   . Cancer Other 40    ovarian cancer (maternal niece)    ALLERGIES:  is allergic to latex; cymbalta [duloxetine hcl]; and penicillins.  MEDICATIONS:  Current Outpatient Prescriptions  Medication Sig Dispense Refill  . acetaminophen (TYLENOL) 500 MG tablet Take 1,000 mg by mouth every 6 (six) hours as needed for fever. Reported on 09/24/2015    . ALPRAZolam (XANAX) 0.25 MG tablet Take 1-2 tablets (0.25-0.5 mg total) by mouth at bedtime as needed for anxiety or sleep. 60 tablet 0  . bisacodyl (DULCOLAX) 5 MG EC tablet Take 10 mg by mouth daily as needed for moderate constipation.    . cobimetinib fumarate (COTELLIC) 20 MG tablet Take 3 tablets (60 mg total) by mouth daily. Take on days 1-21. Repeat every 28 days. Avoid sun exposure. 63 tablet 0  . cyclobenzaprine (FLEXERIL) 10 MG tablet Take 1 tablet (10 mg total) by mouth 3 (three) times daily as needed for muscle spasms. 60 tablet 0  . dexamethasone (DECADRON) 4 MG tablet Take 2 tablets (8 mg total) by mouth daily. Start day after chemo and take for 3 days after each IV chemo (Patient not taking: Reported on 09/15/2016) 30 tablet 1  . diphenhydrAMINE (BENADRYL) 25 mg capsule Take 25 mg by mouth every 6 (six) hours as needed for sleep.    Marland Kitchen docusate sodium (COLACE) 100 MG capsule Take 300 mg by mouth daily.    . ferrous sulfate 325 (65 FE) MG tablet Take 325 mg by mouth 2 (two) times daily with a meal. Reported on 10/12/2015    . lidocaine-prilocaine (EMLA) cream Apply 1 application topically as needed. 30 g 2  . magnesium citrate SOLN Take 1 Bottle by mouth once.    . Oxycodone HCl 10 MG TABS Take 1 tablet (10 mg total) by mouth every 4 (four) hours. 120 tablet 0  . traMADol (ULTRAM) 50 MG tablet Take 1-2 tablets (50-100 mg total) by mouth every 6 (six) hours as needed. 60 tablet 0    . vitamin B-12 (CYANOCOBALAMIN) 1000 MCG tablet Take 500 mcg by mouth daily. Reported on 09/24/2015    . vitamin C (ASCORBIC ACID) 500 MG tablet Take 500 mg by mouth daily.     No current facility-administered  medications for this visit.    Facility-Administered Medications Ordered in Other Visits  Medication Dose Route Frequency Provider Last Rate Last Dose  . clindamycin (CLEOCIN) 900 mg in dextrose 5 % 50 mL IVPB  900 mg Intravenous 60 min Pre-Op Rolm Bookbinder, MD       And  . gentamicin (GARAMYCIN) 310 mg in dextrose 5 % 50 mL IVPB  5 mg/kg Intravenous 60 min Pre-Op Rolm Bookbinder, MD      . heparin lock flush 100 unit/mL  500 Units Intravenous PRN Truitt Merle, MD   500 Units at 10/04/16 1644  . sodium chloride 0.9 % injection 10 mL  10 mL Intracatheter PRN Truitt Merle, MD   10 mL at 06/23/16 1139  . sodium chloride 0.9 % injection 10 mL  10 mL Intravenous PRN Truitt Merle, MD   10 mL at 10/04/16 1644    REVIEW OF SYSTEMS:  Constitutional: Denies fevers, chills or abnormal night sweats. (+) fatigue Eyes: Denies blurriness of vision, double vision or watery eyes Ears, nose, mouth, throat, and face: Denies mucositis or sore throat Respiratory: Denies cough, dyspnea or wheezes Cardiovascular: Denies palpitation, chest discomfort or lower extremity swelling Gastrointestinal:  Denies heartburn. (+) colostomy Skin: Denies abnormal skin rashes Lymphatics: Denies new lymphadenopathy or easy bruising  Neurological:Denies numbness, tingling or new weaknesses Behavioral/Psych: Mood is stable, no new changes  All other systems were reviewed with the patient and are negative.  PHYSICAL EXAMINATION: ECOG PERFORMANCE STATUS: 2-3  Vitals:   11/04/16 1046  BP: 112/79  Pulse: (!) 108  Resp: 18  Temp: 98.4 F (36.9 C)   Filed Weights   11/04/16 1046  Weight: 108 lb 9.6 oz (49.3 kg)    GENERAL:alert, no distress and comfortable SKIN: skin color, texture, turgor are normal, no rashes or  significant lesions EYES: normal, conjunctiva are pink and non-injected, sclera clear OROPHARYNX:no exudate, no erythema and lips, buccal mucosa, and tongue normal  NECK: supple, thyroid normal size, non-tender, without nodularity LYMPH:  no palpable lymphadenopathy in the cervical, axillary or inguinal   LUNGS: clear to auscultation and percussion with normal breathing effort HEART: regular rate & rhythm and no murmurs and no lower extremity edema ABDOMEN:abdomen soft, normal bowel sounds,  (+) Colostomy bag in the mid abdomen, with brownish stool. She has mild tenderness at the colostomy bag site, no rebound pain.  Musculoskeletal:no cyanosis of digits and no clubbing  PSYCH: alert & oriented x 3 with fluent speech NEURO: no focal motor/sensory deficits  LABORATORY DATA:  I have reviewed the data as listed CBC Latest Ref Rng & Units 11/04/2016 10/17/2016 10/16/2016  WBC 3.9 - 10.3 10e3/uL 10.0 7.0 7.3  Hemoglobin 11.6 - 15.9 g/dL 8.7(L) 8.6(L) 9.2(L)  Hematocrit 34.8 - 46.6 % 28.4(L) 26.4(L) 28.5(L)  Platelets 145 - 400 10e3/uL 441(H) 415(H) 520(H)   CMP Latest Ref Rng & Units 11/04/2016 10/20/2016 10/19/2016  Glucose 70 - 140 mg/dl 86 93 86  BUN 7.0 - 26.0 mg/dL 6.2(L) <5(L) <5(L)  Creatinine 0.6 - 1.1 mg/dL 0.5(L) 0.40(L) 0.36(L)  Sodium 136 - 145 mEq/L 134(L) 132(L) 133(L)  Potassium 3.5 - 5.1 mEq/L 4.4 3.4(L) 3.8  Chloride 101 - 111 mmol/L - 96(L) 96(L)  CO2 22 - 29 mEq/L 26 27 28   Calcium 8.4 - 10.4 mg/dL 9.4 8.5(L) 8.7(L)  Total Protein 6.4 - 8.3 g/dL 6.8 5.9(L) -  Total Bilirubin 0.20 - 1.20 mg/dL <0.22 0.6 -  Alkaline Phos 40 - 150 U/L 127 119 -  AST  5 - 34 U/L 11 17 -  ALT 0 - 55 U/L 9 14 -   CEA 02/04/2016: 2.5 03/18/2016: 6.84 06/02/2016: 21.73 08/18/2016: 14.27 09/15/2016: 9.01  PATHOLOGY REPORT  05/30/2016 Diagnosis Peritoneum, biopsy, left lower quadrant mesentery ADENOCARCINOMA WITH ABUNDANT EXTRACELLULAR MUCIN CONSISTENT WITH COLONIC PRIMARY   FOUNDATION ONE  [id number] 05/30/2016   Diagnosis 08/03/2015 Colon, segmental resection, Right - INVASIVE ADENOCARCINOMA WITH ABUNDANT EXTRACELLULAR MUCIN, MODERATELY DIFFERENTIATED, LIKELY ARISING FROM ILEOCECAL VALVE. - ADENOCARCINOMA EXTENDS AT LEAST INTO THE PERICOLONIC SOFT TISSUE AND IS ASSOCIATED WITH TRANSMURAL DEFECT. - THE PROXIMAL AND DISTAL RESECTION MARGINS ARE NEGATIVE FOR ADENOCARCINOMA. - ACELLULAR MUCIN IS GROSSLY PRESENT AT THE SEROSAL SURFACE. - LYMPHOVASCULAR INVASION IS IDENTIFIED. - METASTATIC CARCINOMA IN 1 OF 40 LYMPH NODES (1/40). - SEE ONCOLOGY TABLE BELOW. Microscopic Comment COLON AND RECTUM (INCLUDING TRANS-ANAL RESECTION): Specimen: Right colon and terminal ileum. Procedure: Resection. Tumor site: Likely ileocecal valve. Specimen integrity: Transmural defect(s). Macroscopic intactness of mesorectum: N/A Macroscopic tumor perforation: Present. Invasive tumor: Maximum size: At least 2.5 cm. Histologic type(s): Adenocarcinoma with abundant extracellular mucin. Histologic grade and differentiation: G2: moderately differentiated. Type of polyp in which invasive carcinoma arose: Tubular adenoma. Microscopic extension of invasive tumor: Adenocarcinoma extends into pericolonic soft tissue and acellular mucin is grossly present at the serosal surface. Lymph-Vascular invasion: Present. Peri-neural invasion: Not identified. Tumor deposit(s) (discontinuous extramural extension): Not identified. Resection margins: Proximal margin: Negative for carcinoma. Distal margin: Negative for carcinoma. Circumferential (radial) (posterior ascending, posterior descending; lateral and posterior mid-rectum; and entire lower 1/3 rectum): Acellular mucin is present at the circumferential tissue edge. Treatment effect (neo-adjuvant therapy): N/A Additional polyp(s): Not identified. Non-neoplastic findings: No significant findings. Lymph nodes: number examined 40; number positive: 1 Pathologic  Staging: at least pT4a, pN1a. Ancillary studies: MMR by IHC and MSI by PCR will be performed and the results reported separately. Additional studies can be performed upon clinician request.   ADDITIONAL INFORMATION: Mismatch Repair (MMR) Protein Immunohistochemistry (IHC) IHC Expression Result: MLH1: EQUIVOCAL MSH2: EQUIVOCAL MSH6: EQUIVOCAL PMS2: EQUIVOCAL * Internal control demonstrates intact nuclear expression Interpretation: EQUIVOCAL The tumor shows partial staining with all anibodies and hence the results are considered equivocal. Correlated with molecular based MSI testing is strongly recommended.    RADIOGRAPHIC STUDIES: I have personally reviewed the radiological images as listed and agreed with the findings in the report.  Echocardiogram 11/03/16 Left ventricle: The cavity size was normal. Wall thickness was   normal. Systolic function was normal. The estimated ejection   fraction was in the range of 55% to 60%. Mildly reduced GLPSS at   -17%. Wall motion was normal; there were no regional wall motion   abnormalities. The study is not technically sufficient to allow   evaluation of LV diastolic function.  CT CAP w/ Contrast 10/09/2016 IMPRESSION: Prominent diffuse peritoneal carcinomatosis with low-attenuation lesions seen throughout the peritoneal cavity and causing scalloping of the margins of the liver and spleen. Some loculated fluid collections may be present, likely due to malignant ascites. Appearances are similar to previous study. There appears to be partial colectomy with remaining colon distended and filled with contrast material and gas. Contrast material in the colon suggest no evidence of complete obstruction. Segmental areas of wall thickening and edema demonstrated in the small bowel and in the sigmoid colon, likely resulting from serosal implants of carcinomatosis. Superimposed inflammatory process not entirely excluded.  CT CAP w/ Contrast  08/17/2016 IMPRESSION: 1. Interval progression of peritoneal carcinomatosis with progressive scalloping of the liver and increase  in size of the peritoneal masses. 2. Multiple pulmonary nodules are again noted. All these remain stable with the exception of a left upper lobe nodule which has increased to 5 mm from 3 mm previously.  NM PET 05/20/2016 IMPRESSION: 1. Peritoneal carcinomatosis with hypermetabolic peritoneal masses throughout the perihepatic space, left upper quadrant, omentum, mesenteric and pelvic peritoneum. Small volume pelvic ascites. 2. Hypermetabolic mass in the deep subcutaneous ventral right lateral pelvic wall, probably a metastasis. 3. Five scattered pulmonary nodules in both lungs, largest 4 mm, below PET resolution, two of which in the upper lobes are new since 09/03/2015, cannot exclude pulmonary metastases. 4. Nonenlarged mildly hypermetabolic left axillary lymph node, nonspecific, favor benign injection related uptake (radiotracer was injected in a left hand peripheral IV). 5. Additional findings include aortic atherosclerosis and 2 vessel coronary atherosclerosis.  ASSESSMENT & PLAN:  51 y.o. Caucasian female, with past medical history of fibromyalgia, depression, presented with ruptured cecum and pericolonic abscess, required multiple drainage, antibiotics, and eventually right colectomy.  1. Right colon cancer, cecum, moderately differentiated adenocarcinoma, pT4N1aM0, stage IIIA, with perforation, MSI-stable, KRAS mutation (+), peritoneum recurrence - I previously reviewed her scan findings, and surgical pathology results in great details with patient and her Husband.  - she had locally advanced stage III  Disease, with particular high risk features of T4 tumor, proliferation for 3-4 months,  And positive lymph nodes, she is at very high risk for cancer recurrence, especially peritoneal carcinomatosis. -She has completed adjuvant chemotherapy CAPOX,  oxaliplatin dose was significantly reduced due to her poor tolerance. -I previously discussed her surveillance CT scan from 04/29/2016, which unfortunately showed probable peritoneal carcinomatosis. No other significant metastasis on the CT scan. We discussed that the metastasis is not definitive based on CT, and needs to be confirmed by biopsy  -I previously reviewed her PET scan from 05/20/16, which showed hypermetabolic peritoneal carcinomatosis, in a few small lung nodules which are indeterminate. -She started chemotherapy FOLFIRI and Avastin on 06/02/16, tolerated well, and her abdominal pain has improved. Unfortunately she had significant disease progression in peritoneum, which caused sigmoid colon obstruction, status post transverse colon diverting colostomy. Chemo held.  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 -We discussed changing to third line therapy with Atezolitumab (immunotherapy) and Cobimetinib (MEK inhibitor). This was studied in [facility] clinical trial which showed 17% PR and 22% SD. The large phase 3 clinical trial of this combination is still ongoing. Her insurance has denied the medications, we are trying to get free drug replacement for her. -We discussed the potential side effects from Atezolitumab and Cobimetinib, which includes, but not limited to, fatigue, or immune disease, skin rash, rhabdomyolysis, congestive heart failure, abnormal liver and kidney function, anemia, thrombocytopenia, neutropenia, risk of infection, nausea, diarrhea etc. She agrees to proceed. -Her baseline echo is normal, we'll check CK before her first treatment. -Again we discussed the goal of care is palliative.  -I'll start her treatment in a few weeks, as soon as we receive the free drugs.   2. Abdominal pain -Likely related to her cancer recurrence -Improved since she started  chemotherapy -Continue oxycodone 10 mg pills as needed, about 4-5 a day. I will think about putting her on long acting pain medication if this continues. -Her pain overall is controlled   3. Anemia, iron deficient anemia  - likely secondary to surgery,  Chronic infection and GI bleeding from the tumor -Previous  Iron study showed low serum iron, transferrin saturation and ferritin 12, which is consistent with iron deficiency -Due to her significant worsening anemia, hemoglobin 8.7 on 06/23/16, I recommendeded IV Feraheme weekly twice . Her anemia has approved some -We'll consider blood transfusion if hemoglobin less than 8 or symptomatic anemia with hemoglobin 8-9 -She will continue oral iron -Her repeated on study on 11/04/2016 showed elevated ferritin, very low serum iron and TIBC, consistent with anemia of chronic disease, and a component of iron deficiency. I'll set up IV Feraheme once to see if her anemia improves.  4. Fibromyalgia and depression - she will continue medication, and follow-up with her primary care physician  5. Constipation -She will continue stool softener and laxative senna-s every day. -She had a colostomy placed on 10/13/16 and the stool is formed. Denies issues with colostomy.  6. Goal of care discussion  -We again discussed the incurable nature of her cancer, and the overall poor prognosis, especially if she does not have good response to chemotherapy or progress on chemo -The patient understands the goal of care is palliative. -I recommend DNR/DNI, she will think about it    Plan -Lab, flush, f/u, and fisrt infusion of Atezolitumab on 11/16/16 or other days that week. We will call to schedule the patient.  All questions were answered. The patient knows to call the clinic with any problems, questions or concerns.  I spent 30 minutes counseling the patient face to face. The total time spent in the appointment was 40 minutes and more than 50% was on counseling.        Truitt Merle, MD 11/04/2016   This document serves as a record of services personally performed by Truitt Merle, MD. It was created on her behalf by Darcus Austin, a trained medical scribe. The creation of this record is based on the scribe's personal observations and the provider's statements to them. This document has been checked and approved by the attending provider.

## 2016-11-07 LAB — CK: Creatine Kinase Total: 12 U/L — ABNORMAL LOW (ref 24–173)

## 2016-11-15 ENCOUNTER — Telehealth: Payer: Self-pay | Admitting: Hematology

## 2016-11-15 ENCOUNTER — Telehealth: Payer: Self-pay | Admitting: *Deleted

## 2016-11-15 NOTE — Telephone Encounter (Signed)
Rob,  I have spoken with the MD at pt's insurance Dr. Simona Huh Hammonds twice, he has denied it. Could you call him, his cell number is [phone] to speed up the process? Please keep me posted, thanks   Thanks   Truitt Merle MD

## 2016-11-15 NOTE — Telephone Encounter (Signed)
Faxed completed Disability forms to [employer] on 10/19/16 fax [phone] and scanned copy into Epic

## 2016-11-15 NOTE — Telephone Encounter (Signed)
Received vm call from pt stating that she was supposed to have treatment tomorrow & hasn't received a call yet regarding an appt.  Reviewed chart & spoke with Rob/Pharm tech/IV replacement specialist & informed that we are still waiting on denial letter from insurance to proceed.  Informed pt of procedures & will be in touch as soon as we know something.  No appts scheduled as of yet. Message to Dr Burr Medico.

## 2016-11-24 ENCOUNTER — Telehealth: Payer: Self-pay | Admitting: Pharmacist

## 2016-11-24 NOTE — Telephone Encounter (Signed)
Oral Chemotherapy Pharmacist Encounter  Applications to [employer] to [employer] ([place]) for [employer] and Tecentriq faxed to [phone].   Included with application packet is insurance attestation that the office has received prior authorization denials and denials for 1st level of appeals for both medications.  I called and updated patient to status of progress. Patient understands and expressed appreciation. She knows the office will be reaching out shortly to get her scheduled for infusion.  We will continue to update patient about application status as we have more information.  Patient knows to call the office with questions or concerns.  This encounter will continue to be updated until final determination.  Oral Oncology Clinic will continue to follow.   Johny Drilling, PharmD, BCPS, BCOP 11/24/2016  11:06 AM Oral Oncology Clinic [phone]

## 2016-11-28 ENCOUNTER — Other Ambulatory Visit: Payer: Self-pay | Admitting: Hematology

## 2016-11-28 DIAGNOSIS — C182 Malignant neoplasm of ascending colon: Secondary | ICD-10-CM

## 2016-11-29 NOTE — Telephone Encounter (Signed)
Oral Chemotherapy Pharmacist Encounter  Received notification from [place] that patient has been successfully enrolled into their program to receive Cotellic at $0 out of pocket cost from the manufacturer. They need a new prescription faxed from the office for continued processing.  Tecentriq application is still in process. It has been flagged for the requirement of income verification. This can be accomplished by faxing in proof of income documents in the form of a copy of patient's taxes, with income statements such as a W-2, or 3 months of bank statements.  I LVM for patient with request for these documents and with good news about the [place] approval.  Once we receive income documents from patient, they can be faxed to [place] at [phone]. On cover sheet we should include:  Patient name  DOB  Pt ID# [id number]  Service request # [id number] This will ensure documentation gets placed on the right account and there are no delays due to incomplete documentation.  Oral oncology Clinic will continue to follow.  Johny Drilling, PharmD, BCPS, BCOP 11/29/2016  10:07 AM Oral Oncology Clinic [phone]

## 2016-11-30 ENCOUNTER — Other Ambulatory Visit: Payer: Self-pay | Admitting: Hematology

## 2016-11-30 ENCOUNTER — Other Ambulatory Visit: Payer: Self-pay | Admitting: *Deleted

## 2016-11-30 DIAGNOSIS — C182 Malignant neoplasm of ascending colon: Secondary | ICD-10-CM

## 2016-12-01 ENCOUNTER — Telehealth: Payer: Self-pay | Admitting: Hematology

## 2016-12-01 NOTE — Telephone Encounter (Signed)
Oral Chemotherapy Pharmacist Encounter  Spoke with Shirlean Mylar. She will fax or e-mail 2 months of pay stubs to Oral Oncology Clinic. Once received, we will fax to [facility].  This encounter will continue to be updated until final determination.  Oral Oncology Clinic will continue to follow.   Johny Drilling, PharmD, BCPS, BCOP 12/01/2016  11:04 AM Oral Oncology Clinic [phone]

## 2016-12-01 NOTE — Telephone Encounter (Signed)
Oral Chemotherapy Pharmacist Encounter  Cotellic prescription faxed to [place] at [phone].  I called patient to follow-up on request for income documentation requested to get patient approved for the Tecentriq.  I spoke with patient's significant other, Ricky. They had received my message with request for documents on 5/8. Patient was currently sleeping, but Audry Pili will follow up with [place] when she gets up to discuss getting these document to [place] so we can fax to the manufacturer.  Oral Oncology Clinic will continue to follow.  Johny Drilling, PharmD, BCPS, BCOP 12/01/2016  10:32 AM Oral Oncology Clinic [phone]

## 2016-12-01 NOTE — Telephone Encounter (Signed)
lvm to inform pt of 5/18 appts per LOS

## 2016-12-02 ENCOUNTER — Telehealth: Payer: Self-pay

## 2016-12-02 ENCOUNTER — Other Ambulatory Visit: Payer: Self-pay | Admitting: Hematology

## 2016-12-02 DIAGNOSIS — C182 Malignant neoplasm of ascending colon: Secondary | ICD-10-CM

## 2016-12-02 MED ORDER — ALPRAZOLAM 0.25 MG PO TABS: 0.2500 mg | ORAL_TABLET | Freq: Every evening | ORAL | 0 refills | Status: DC | PRN

## 2016-12-02 MED ORDER — OXYCODONE HCL 10 MG PO TABS: 10.0000 mg | ORAL_TABLET | ORAL | 0 refills | Status: DC

## 2016-12-02 NOTE — Telephone Encounter (Signed)
Refilled, please call pt to pick it up. Thanks   Truitt Merle MD

## 2016-12-02 NOTE — Telephone Encounter (Signed)
Pt called for refill of xanax and oxycodone 10mg .  Please call pt when ready for pickup. She is out of xanax and low on oxycodone.

## 2016-12-09 ENCOUNTER — Other Ambulatory Visit (HOSPITAL_BASED_OUTPATIENT_CLINIC_OR_DEPARTMENT_OTHER): Payer: 59

## 2016-12-09 ENCOUNTER — Ambulatory Visit (HOSPITAL_BASED_OUTPATIENT_CLINIC_OR_DEPARTMENT_OTHER): Payer: 59 | Admitting: Hematology

## 2016-12-09 ENCOUNTER — Ambulatory Visit: Payer: 59

## 2016-12-09 ENCOUNTER — Ambulatory Visit (HOSPITAL_BASED_OUTPATIENT_CLINIC_OR_DEPARTMENT_OTHER): Payer: 59

## 2016-12-09 VITALS — BP 100/77 | HR 101 | Temp 98.2°F | Resp 18 | Ht 60.0 in | Wt 110.9 lb

## 2016-12-09 VITALS — HR 90

## 2016-12-09 DIAGNOSIS — F329 Major depressive disorder, single episode, unspecified: Secondary | ICD-10-CM | POA: Diagnosis not present

## 2016-12-09 DIAGNOSIS — D500 Iron deficiency anemia secondary to blood loss (chronic): Secondary | ICD-10-CM

## 2016-12-09 DIAGNOSIS — C779 Secondary and unspecified malignant neoplasm of lymph node, unspecified: Secondary | ICD-10-CM

## 2016-12-09 DIAGNOSIS — C182 Malignant neoplasm of ascending colon: Secondary | ICD-10-CM

## 2016-12-09 DIAGNOSIS — R634 Abnormal weight loss: Secondary | ICD-10-CM

## 2016-12-09 DIAGNOSIS — E440 Moderate protein-calorie malnutrition: Secondary | ICD-10-CM

## 2016-12-09 DIAGNOSIS — M797 Fibromyalgia: Secondary | ICD-10-CM | POA: Diagnosis not present

## 2016-12-09 DIAGNOSIS — D638 Anemia in other chronic diseases classified elsewhere: Secondary | ICD-10-CM

## 2016-12-09 DIAGNOSIS — D509 Iron deficiency anemia, unspecified: Secondary | ICD-10-CM

## 2016-12-09 DIAGNOSIS — R109 Unspecified abdominal pain: Secondary | ICD-10-CM

## 2016-12-09 DIAGNOSIS — C786 Secondary malignant neoplasm of retroperitoneum and peritoneum: Secondary | ICD-10-CM

## 2016-12-09 DIAGNOSIS — R911 Solitary pulmonary nodule: Secondary | ICD-10-CM

## 2016-12-09 DIAGNOSIS — Z7189 Other specified counseling: Secondary | ICD-10-CM

## 2016-12-09 DIAGNOSIS — Z5112 Encounter for antineoplastic immunotherapy: Secondary | ICD-10-CM

## 2016-12-09 DIAGNOSIS — K5900 Constipation, unspecified: Secondary | ICD-10-CM

## 2016-12-09 DIAGNOSIS — Z95828 Presence of other vascular implants and grafts: Secondary | ICD-10-CM

## 2016-12-09 LAB — COMPREHENSIVE METABOLIC PANEL
ALBUMIN: 2.7 g/dL — AB (ref 3.5–5.0)
ALK PHOS: 89 U/L (ref 40–150)
ALT: <6 U/L (ref 0–55)
AST: 7 U/L (ref 5–34)
Anion Gap: 9 mEq/L (ref 3–11)
BILIRUBIN TOTAL: 0.22 mg/dL (ref 0.20–1.20)
BUN: 4.2 mg/dL — AB (ref 7.0–26.0)
CO2: 26 meq/L (ref 22–29)
CREATININE: 0.6 mg/dL (ref 0.6–1.1)
Calcium: 9.4 mg/dL (ref 8.4–10.4)
Chloride: 104 mEq/L (ref 98–109)
EGFR: >90 mL/min/{1.73_m2} (ref >90)
GLUCOSE: 80 mg/dL (ref 70–140)
Potassium: 3.8 mEq/L (ref 3.5–5.1)
SODIUM: 139 meq/L (ref 136–145)
TOTAL PROTEIN: 7.1 g/dL (ref 6.4–8.3)

## 2016-12-09 LAB — CBC WITH DIFFERENTIAL/PLATELET
BASO%: 0.8 % (ref 0.0–2.0)
BASOS ABS: 0.1 10*3/uL (ref 0.0–0.1)
EOS%: 1.6 % (ref 0.0–7.0)
Eosinophils Absolute: 0.1 10*3/uL (ref 0.0–0.5)
HEMATOCRIT: 27 % — AB (ref 34.8–46.6)
HGB: 8.4 g/dL — ABNORMAL LOW (ref 11.6–15.9)
LYMPH%: 19.4 % (ref 14.0–49.7)
MCH: 24.6 pg — AB (ref 25.1–34.0)
MCHC: 31.1 g/dL — ABNORMAL LOW (ref 31.5–36.0)
MCV: 79.1 fL — ABNORMAL LOW (ref 79.5–101.0)
MONO#: 0.7 10*3/uL (ref 0.1–0.9)
MONO%: 9.5 % (ref 0.0–14.0)
NEUT#: 5.3 10*3/uL (ref 1.5–6.5)
NEUT%: 68.7 % (ref 38.4–76.8)
Platelets: 498 10*3/uL — ABNORMAL HIGH (ref 145–400)
RBC: 3.41 10*6/uL — AB (ref 3.70–5.45)
RDW: 16.5 % — ABNORMAL HIGH (ref 11.2–14.5)
WBC: 7.7 10*3/uL (ref 3.9–10.3)
lymph#: 1.5 10*3/uL (ref 0.9–3.3)

## 2016-12-09 LAB — CEA (IN HOUSE-CHCC): CEA (CHCC-In House): 5.01 ng/mL — ABNORMAL HIGH (ref 0.00–5.00)

## 2016-12-09 MED ORDER — PROCHLORPERAZINE MALEATE 10 MG PO TABS: 10.0000 mg | ORAL_TABLET | Freq: Four times a day (QID) | ORAL | 2 refills | Status: DC | PRN

## 2016-12-09 MED ORDER — SODIUM CHLORIDE 0.9% FLUSH
10.0000 mL | INTRAVENOUS | Status: DC | PRN
  Administered 2016-12-09: 10 mL
  Filled 2016-12-09: qty 10

## 2016-12-09 MED ORDER — HEPARIN SOD (PORK) LOCK FLUSH 100 UNIT/ML IV SOLN
500.0000 [IU] | Freq: Once | INTRAVENOUS | Status: AC | PRN
  Administered 2016-12-09: 500 [IU]
  Filled 2016-12-09: qty 5

## 2016-12-09 MED ORDER — SODIUM CHLORIDE 0.9 % IJ SOLN
10.0000 mL | INTRAMUSCULAR | Status: DC | PRN
  Administered 2016-12-09: 10 mL via INTRAVENOUS
  Filled 2016-12-09: qty 10

## 2016-12-09 MED ORDER — SODIUM CHLORIDE 0.9 % IV SOLN
Freq: Once | INTRAVENOUS | Status: AC
  Administered 2016-12-09: 15:00:00 via INTRAVENOUS

## 2016-12-09 MED ORDER — SODIUM CHLORIDE 0.9 % IV SOLN
510.0000 mg | Freq: Once | INTRAVENOUS | Status: AC
  Administered 2016-12-09: 510 mg via INTRAVENOUS
  Filled 2016-12-09: qty 17

## 2016-12-09 MED ORDER — SODIUM CHLORIDE 0.9 % IV SOLN
800.0000 mg | Freq: Once | INTRAVENOUS | Status: AC
  Administered 2016-12-09: 800 mg via INTRAVENOUS
  Filled 2016-12-09: qty 13.33

## 2016-12-09 NOTE — Patient Instructions (Addendum)
[facility] Discharge Instructions for Patients Receiving Chemotherapy  Today you received the following chemotherapy agents: Atezolizumab   To help prevent nausea and vomiting after your treatment, we encourage you to take your nausea medication as directed.    If you develop nausea and vomiting that is not controlled by your nausea medication, call the clinic.   BELOW ARE SYMPTOMS THAT SHOULD BE REPORTED IMMEDIATELY:  *FEVER GREATER THAN 100.5 F  *CHILLS WITH OR WITHOUT FEVER  NAUSEA AND VOMITING THAT IS NOT CONTROLLED WITH YOUR NAUSEA MEDICATION  *UNUSUAL SHORTNESS OF BREATH  *UNUSUAL BRUISING OR BLEEDING  TENDERNESS IN MOUTH AND THROAT WITH OR WITHOUT PRESENCE OF ULCERS  *URINARY PROBLEMS  *BOWEL PROBLEMS  UNUSUAL RASH Items with * indicate a potential emergency and should be followed up as soon as possible.  Feel free to call the clinic you have any questions or concerns. The clinic phone number is [phone].  Please show the [facility] at check-in to the Emergency Department and triage nurse.    Atezolizumab injection What is this medicine? ATEZOLIZUMAB (a te zoe LIZ ue mab) is a monoclonal antibody. It is used to treat bladder cancer (urothelial cancer) and non-small cell lung cancer. This medicine may be used for other purposes; ask your health care provider or pharmacist if you have questions. COMMON BRAND NAME(S): Tecentriq What should I tell my health care provider before I take this medicine? They need to know if you have any of these conditions: -diabetes -immune system problems -infection -inflammatory bowel disease -liver disease -lung or breathing disease -lupus -nervous system problems like myasthenia gravis or Guillain-Barre syndrome -organ transplant -an unusual or allergic reaction to atezolizumab, other medicines, foods, dyes, or preservatives -pregnant or trying to get pregnant -breast-feeding How should I use this  medicine? This medicine is for infusion into a vein. It is given by a health care professional in a hospital or clinic setting. A special MedGuide will be given to you before each treatment. Be sure to read this information carefully each time. Talk to your pediatrician regarding the use of this medicine in children. Special care may be needed. Overdosage: If you think you have taken too much of this medicine contact a poison control center or emergency room at once. NOTE: This medicine is only for you. Do not share this medicine with others. What if I miss a dose? It is important not to miss your dose. Call your doctor or health care professional if you are unable to keep an appointment. What may interact with this medicine? Interactions have not been studied. This list may not describe all possible interactions. Give your health care provider a list of all the medicines, herbs, non-prescription drugs, or dietary supplements you use. Also tell them if you smoke, drink alcohol, or use illegal drugs. Some items may interact with your medicine. What should I watch for while using this medicine? Your condition will be monitored carefully while you are receiving this medicine. You may need blood work done while you are taking this medicine. Do not become pregnant while taking this medicine or for at least 5 months after stopping it. Women should inform their doctor if they wish to become pregnant or think they might be pregnant. There is a potential for serious side effects to an unborn child. Talk to your health care professional or pharmacist for more information. Do not breast-feed an infant while taking this medicine or for at least 5 months after the last  dose. What side effects may I notice from receiving this medicine? Side effects that you should report to your doctor or health care professional as soon as possible: -allergic reactions like skin rash, itching or hives, swelling of the face,  lips, or tongue -black, tarry stools -bloody or watery diarrhea -breathing problems -changes in vision -chest pain or chest tightness -chills -facial flushing -fever -headache -signs and symptoms of high blood sugar such as dizziness; dry mouth; dry skin; fruity breath; nausea; stomach pain; increased hunger or thirst; increased urination -signs and symptoms of liver injury like dark yellow or brown urine; general ill feeling or flu-like symptoms; light-colored stools; loss of appetite; nausea; right upper belly pain; unusually weak or tired; yellowing of the eyes or skin -stomach pain -trouble passing urine or change in the amount of urine Side effects that usually do not require medical attention (report to your doctor or health care professional if they continue or are bothersome): -cough -diarrhea -joint pain -muscle pain -muscle weakness -tiredness -weight loss This list may not describe all possible side effects. Call your doctor for medical advice about side effects. You may report side effects to FDA at 1-800-FDA-1088. Where should I keep my medicine? This drug is given in a hospital or clinic and will not be stored at home. NOTE: This sheet is a summary. It may not cover all possible information. If you have questions about this medicine, talk to your doctor, pharmacist, or health care provider.  2018 Elsevier/Gold Standard (2015-08-12 17:54:14)  Ferumoxytol injection What is this medicine? FERUMOXYTOL is an iron complex. Iron is used to make healthy red blood cells, which carry oxygen and nutrients throughout the body. This medicine is used to treat iron deficiency anemia in people with chronic kidney disease. This medicine may be used for other purposes; ask your health care provider or pharmacist if you have questions. COMMON BRAND NAME(S): Feraheme What should I tell my health care provider before I take this medicine? They need to know if you have any of these  conditions: -anemia not caused by low iron levels -high levels of iron in the blood -magnetic resonance imaging (MRI) test scheduled -an unusual or allergic reaction to iron, other medicines, foods, dyes, or preservatives -pregnant or trying to get pregnant -breast-feeding How should I use this medicine? This medicine is for injection into a vein. It is given by a health care professional in a hospital or clinic setting. Talk to your pediatrician regarding the use of this medicine in children. Special care may be needed. Overdosage: If you think you have taken too much of this medicine contact a poison control center or emergency room at once. NOTE: This medicine is only for you. Do not share this medicine with others. What if I miss a dose? It is important not to miss your dose. Call your doctor or health care professional if you are unable to keep an appointment. What may interact with this medicine? This medicine may interact with the following medications: -other iron products This list may not describe all possible interactions. Give your health care provider a list of all the medicines, herbs, non-prescription drugs, or dietary supplements you use. Also tell them if you smoke, drink alcohol, or use illegal drugs. Some items may interact with your medicine. What should I watch for while using this medicine? Visit your doctor or healthcare professional regularly. Tell your doctor or healthcare professional if your symptoms do not start to get better or if they get worse.  You may need blood work done while you are taking this medicine. You may need to follow a special diet. Talk to your doctor. Foods that contain iron include: whole grains/cereals, dried fruits, beans, or peas, leafy green vegetables, and organ meats (liver, kidney). What side effects may I notice from receiving this medicine? Side effects that you should report to your doctor or health care professional as soon as  possible: -allergic reactions like skin rash, itching or hives, swelling of the face, lips, or tongue -breathing problems -changes in blood pressure -feeling faint or lightheaded, falls -fever or chills -flushing, sweating, or hot feelings -swelling of the ankles or feet Side effects that usually do not require medical attention (report to your doctor or health care professional if they continue or are bothersome): -diarrhea -headache -nausea, vomiting -stomach pain This list may not describe all possible side effects. Call your doctor for medical advice about side effects. You may report side effects to FDA at 1-800-FDA-1088. Where should I keep my medicine? This drug is given in a hospital or clinic and will not be stored at home. NOTE: This sheet is a summary. It may not cover all possible information. If you have questions about this medicine, talk to your doctor, pharmacist, or health care provider.  2018 Elsevier/Gold Standard (2015-08-13 12:41:49)

## 2016-12-09 NOTE — Patient Instructions (Signed)

## 2016-12-09 NOTE — Progress Notes (Signed)
[place]  [phone] [phone]  Clinic follow up Note   Patient Care Team: Carol Ada, MD as PCP - General (Family Medicine) Rolm Bookbinder, MD as Consulting Physician (General Surgery) 12/09/2016   CHIEF COMPLAINTS:  Follow up metastatic colon cancer   Oncology History   Cancer of right colon [facility])   Staging form: Colon and Rectum, AJCC 7th Edition     Pathologic stage from 08/03/2015: Stage IIIB (T4a, N1a, cM0) - Signed by Truitt Merle, MD on 09/04/2015       Cancer of right colon ([place])   03/31/2015 Imaging    CT abdomen showed appendicitis, perforated, with periappendiceal abscess. And small pulmonary nodules.      04/03/2015 Procedure    CT guided placement of going Through into the right lower abdominal quadrant with aspiration of a total of 30 mL prudent fluid.       08/03/2015 Initial Diagnosis    Cancer of right colon ([place])      08/03/2015 Surgery    Right hemicolectomy      08/03/2015 Pathology Results    Right hemicolectomy showed invasive adenocarcinoma, moderately differentiated, likely arising from a cecal valve. One out of 40 lymph nodes were positive, (+) LVI, margins negative       08/03/2015 Miscellaneous    colon cance MSI-stable       09/18/2015 - 03/31/2016 Chemotherapy    Adjuvant CAPOX, she tolerated oxaliplatin poorly, which was held from cycle 2 to 4 due to poor tolerance, restarted from cycle 5 with lower dose, she also had significant hand-foot syndrome from Xeloda. She completed a total of 8 cycles.      05/20/2016 Imaging    PET scan revealed hypermetabolic peritoneal carcinomatosis, five scatted pulmonary nodules in both lungs, largest 4 mm, metastasis not excluded. Hypermetabolic mass in the deep subcutaneous ventral right lateral pelvic wall, probable metastasis.      06/02/2016 - 09/29/2016 Chemotherapy    FOLFIRI every 2 weeks, started on 06/02/2016, Avastin added from cycle 3. Held due to the  patient hospitalized for a colonic obstruction on 10/09/16.       06/26/2016 Miscellaneous    Foundation One revealed KRAS mutation (+), MSI-stable       08/17/2016 Imaging    CT CAP IMPRESSION: 1. Interval progression of peritoneal carcinomatosis with progressive scalloping of the liver and increase in size of the peritoneal masses. 2. Multiple pulmonary nodules are again noted. All these remain stable with the exception of a left upper lobe nodule which has increased to 5 mm from 3 mm previously.      10/09/2016 - 10/20/2016 Hospital Admission    The patient presented to hospital with constipation and abdominal pain. She had malignant colonic obstruction due to carcinomatosis.      10/09/2016 Imaging     CT AP W CONTRAST IMPRESSION: Prominent diffuse peritoneal carcinomatosis with low-attenuation lesions seen throughout the peritoneal cavity and causing scalloping of the margins of the liver and spleen. Some loculated fluid collections may be present, likely due to malignant ascites. Appearances are similar to previous study.  There appears to be partial colectomy with remaining colon distended and filled with contrast material and gas. Contrast material in the colon suggest no evidence of complete obstruction. Segmental areas of wall thickening and edema demonstrated in the small bowel and in the sigmoid colon, likely resulting from serosal implants of carcinomatosis. Superimposed inflammatory process not entirely excluded.      10/13/2016 Surgery  Patient s/p transverse loop colostomy per Dr. Redmond Pulling 10/13/2016.       11/03/2016 Echocardiogram    Echocardiogram 11/03/16 Left ventricle: The cavity size was normal. Wall thickness was   normal. Systolic function was normal. The estimated ejection   fraction was in the range of 55% to 60%. Mildly reduced GLPSS at   -17%. Wall motion was normal; there were no regional wall motion   abnormalities. The study is not  technically sufficient to allow   evaluation of LV diastolic function.      12/09/2016 -  Chemotherapy    Atezolitumab (immunotherapy) and Cobimetinib (MEK inhibitor) third line chemo therapy clinical trail.   Start Cobimetinib with 1 tablet a day for 3-4 days then 2 tablets daily for 3-4 days then up to 3 tablets daily to monitor tolerance for 3 weeks on and 1 week off.       HISTORY OF PRESENTING ILLNESS:  [name] 51 y.o. female without significant PMH is here because of her recently diagnosed stage III colon cancer. She is accompanied by her boyfriend to the clinic today.  She developed fever and headache in September 2016, was found to have ruptured appendix and was admitted to hospital. CT scan revealed a peri-appendiceal abscess. She underwent abscess drainage tube basement by interventional radiology on 04/17/2015, and a course of antibiotics. She had a multiple drainage tube exchange by IR, but the abscess did not heal. She was referred to Dr. Donne [name]. Multiple CT scan in October and November showed persistent peri-appendiceal fluid collection. She was finally taken to the operation room on 08/03/2015. Intraoperatively, she was found to have a 2 cm whole present in the cecum, she underwent ileocectomy with an anastomosis in the hepatic fixture. 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  She had wound infection issue after surgery. It has been healing slowly, she is still on wet-to-dry dressing change twice daily, has home care nurse. She has moderate pain, 6-7/10, sometime postional, she is on oxycodone 2-3 times a day. Her appetite and eating is getting better, lost about 25lb since 03/2015, no fever and chills for the past 3-4 days, just finished abx yesterday.   CURRENT THERAPY: Chemotherapy Atezolitumab (immunotherapy) every 3 weeks starting today 12/09/2016 and Cobimetinib (MEK inhibitor) 29m daily for 3 weeks then 1 week off, pending   INTERIM HISTORY:    RAdvikareturns for follow-up.She presents to the clinic today and she has still not heard from her pharmacy yet but ready for treatment today. She reports output is really good and her hernia is starting to protrude now. She is changing her bag on her own now. She had not seen her surgeon again, but does not know if she will need to get paperwork to go back to work. She reports she gets tired in the day but she is good. She has to go to bathroom to empty her bag 2-3 times a day. Her pain comes and goes in her abdomen. After a few minutes it resolves and when she gets up and moves. She is able to tolerate her pain from gas. She is taking Oxycodone 4 times a day. She is eating every 3 hours and has gained 2 pounds since last visit.     MEDICAL HISTORY:  Past Medical History:  Diagnosis Date  . Anemia   . Anxiety   . Cancer ([place]    colon cancer stage 3  . Constipation   . Depression   . Fibromyalgia   .  GERD (gastroesophageal reflux disease)   . JP drain bleeding     SURGICAL HISTORY: Past Surgical History:  Procedure Laterality Date  . ABDOMINAL HYSTERECTOMY    . COLON SURGERY  08/03/2015  . COLOSTOMY REVISION N/A 08/03/2015   Procedure: COLON RESECTION RIGHT;  Surgeon: Rolm Bookbinder, MD;  Location: [place];  Service: General;  Laterality: N/A;  . GASTRIC BYPASS    . IR GENERIC HISTORICAL  05/30/2016   IR FLUORO GUIDE PORT INSERTION RIGHT 05/30/2016 Aletta Edouard, MD [facility]-INTER[facility]  . IR GENERIC HISTORICAL  05/30/2016   IR US GUIDE VASC ACCESS RIGHT 05/30/2016 Aletta Edouard, MD [facility]INTER[facility]  . LAPAROSCOPIC ILEOCECECTOMY Right 08/03/2015   Procedure: LAPAROSCOPIC DIAGNOSTIC RIGHT COLECTOMY;  Surgeon: Rolm Bookbinder, MD;  Location: [place];  Service: General;  Laterality: Right;  . LAPAROSCOPY N/A 10/13/2016   Procedure: left upper quadrant LOOP COLOSTOMY;  Surgeon: Greer Pickerel, MD;  Location: [facility];  Service: General;  Laterality: N/A;    SOCIAL HISTORY: Social History   Social  History  . Marital status: Significant Other    Spouse name: N/A  . Number of children: N/A  . Years of education: N/A   Occupational History  . underwriter support specialist    Social History Main Topics  . Smoking status: Never Smoker  . Smokeless tobacco: Never Used  . Alcohol use 1.2 oz/week    2 Glasses of wine per week     Comment: moderate 1-2 times a month   . Drug use: No  . Sexual activity: Not on file   Other Topics Concern  . Not on file   Social History Narrative   Single, fiance [name]   Works [employer] 11:30am to 8 pm    FAMILY HISTORY: Family History  Problem Relation Age of Onset  . Cancer Father 6       small cell lung cancer and colon cancer   . Cancer Sister 50       small cell lung cancer  . Cancer Maternal Aunt        ovarian cancer  . Cancer Paternal Uncle        brain cancer   . Cancer Other 40       ovarian cancer (maternal niece)    ALLERGIES:  is allergic to latex; cymbalta [duloxetine hcl]; and penicillins.  MEDICATIONS:  Current Outpatient Prescriptions  Medication Sig Dispense Refill  . acetaminophen (TYLENOL) 500 MG tablet Take 1,000 mg by mouth every 6 (six) hours as needed for fever. Reported on 09/24/2015    . ALPRAZolam (XANAX) 0.25 MG tablet Take 1-2 tablets (0.25-0.5 mg total) by mouth at bedtime as needed for anxiety or sleep. 60 tablet 0  . cyclobenzaprine (FLEXERIL) 10 MG tablet Take 1 tablet (10 mg total) by mouth 3 (three) times daily as needed for muscle spasms. 60 tablet 0  . dexamethasone (DECADRON) 4 MG tablet Take 2 tablets (8 mg total) by mouth daily. Start day after chemo and take for 3 days after each IV chemo 30 tablet 1  . diphenhydrAMINE (BENADRYL) 25 mg capsule Take 25 mg by mouth every 6 (six) hours as needed for sleep.    Marland Kitchen lidocaine-prilocaine (EMLA) cream Apply 1 application topically as needed. 30 g 2  . Oxycodone HCl 10 MG TABS Take 1 tablet (10 mg total) by mouth every 4 (four) hours. 120  tablet 0  . traMADol (ULTRAM) 50 MG tablet Take 1-2 tablets (50-100 mg total) by mouth every  6 (six) hours as needed. 60 tablet 0  . ferrous sulfate 325 (65 FE) MG tablet Take 325 mg by mouth 2 (two) times daily with a meal. Reported on 10/12/2015    . prochlorperazine (COMPAZINE) 10 MG tablet Take 1 tablet (10 mg total) by mouth every 6 (six) hours as needed for nausea or vomiting. 30 tablet 2  . vitamin B-12 (CYANOCOBALAMIN) 1000 MCG tablet Take 500 mcg by mouth daily. Reported on 09/24/2015    . vitamin C (ASCORBIC ACID) 500 MG tablet Take 500 mg by mouth daily.     No current facility-administered medications for this visit.    Facility-Administered Medications Ordered in Other Visits  Medication Dose Route Frequency Provider Last Rate Last Dose  . clindamycin (CLEOCIN) 900 mg in dextrose 5 % 50 mL IVPB  900 mg Intravenous 60 min Pre-Op Rolm Bookbinder, MD       And  . gentamicin (GARAMYCIN) 310 mg in dextrose 5 % 50 mL IVPB  5 mg/kg Intravenous 60 min Pre-Op Rolm Bookbinder, MD      . heparin lock flush 100 unit/mL  500 Units Intravenous PRN Truitt Merle, MD   500 Units at 10/04/16 1644  . sodium chloride 0.9 % injection 10 mL  10 mL Intracatheter PRN Truitt Merle, MD   10 mL at 06/23/16 1139  . sodium chloride 0.9 % injection 10 mL  10 mL Intravenous PRN Truitt Merle, MD   10 mL at 10/04/16 1644  . sodium chloride 0.9 % injection 10 mL  10 mL Intravenous PRN Truitt Merle, MD   10 mL at 12/09/16 1329    REVIEW OF SYSTEMS:  Constitutional: Denies fevers, chills or abnormal night sweats. (+) fatigue (+) slight purposeful weight gain Eyes: Denies blurriness of vision, double vision or watery eyes Ears, nose, mouth, throat, and face: Denies mucositis or sore throat Respiratory: Denies cough, dyspnea or wheezes Cardiovascular: Denies palpitation, chest discomfort or lower extremity swelling Gastrointestinal:  Denies heartburn. (+) colostomy (+) abdominal pain from surgery (+) hernia Skin: Denies  abnormal skin rashes Lymphatics: Denies new lymphadenopathy or easy bruising  Neurological:Denies numbness, tingling or new weaknesses Behavioral/Psych: Mood is stable, no new changes  All other systems were reviewed with the patient and are negative.  PHYSICAL EXAMINATION: ECOG PERFORMANCE STATUS: 2  Vitals:   12/09/16 1358  BP: 100/77  Pulse: (!) 101  Resp: 18  Temp: 98.2 F (36.8 C)   Filed Weights   12/09/16 1358  Weight: 110 lb 14.4 oz (50.3 kg)     GENERAL:alert, no distress and comfortable SKIN: skin color, texture, turgor are normal, no rashes or significant lesions EYES: normal, conjunctiva are pink and non-injected, sclera clear OROPHARYNX:no exudate, no erythema and lips, buccal mucosa, and tongue normal  NECK: supple, thyroid normal size, non-tender, without nodularity LYMPH:  no palpable lymphadenopathy in the cervical, axillary or inguinal   LUNGS: clear to auscultation and percussion with normal breathing effort HEART: regular rate & rhythm and no murmurs and no lower extremity edema ABDOMEN:abdomen soft, normal bowel sounds,  (+) Colostomy bag in the mid abdomen, with brownish stool. She has mild tenderness at the colostomy bag site, no rebound pain.  (+) 2 palpable nodules Musculoskeletal:no cyanosis of digits and no clubbing  PSYCH: alert & oriented x 3 with fluent speech NEURO: no focal motor/sensory deficits  LABORATORY DATA:  I have reviewed the data as listed CBC Latest Ref Rng & Units 12/09/2016 11/04/2016 10/17/2016  WBC 3.9 - 10.3 10e3/uL 7.7  10.0 7.0  Hemoglobin 11.6 - 15.9 g/dL 8.4(L) 8.7(L) 8.6(L)  Hematocrit 34.8 - 46.6 % 27.0(L) 28.4(L) 26.4(L)  Platelets 145 - 400 10e3/uL 498(H) 441(H) 415(H)   CMP Latest Ref Rng & Units 12/09/2016 11/04/2016 10/20/2016  Glucose 70 - 140 mg/dl 80 86 93  BUN 7.0 - 26.0 mg/dL 4.2(L) 6.2(L) <5(L)  Creatinine 0.6 - 1.1 mg/dL 0.6 0.5(L) 0.40(L)  Sodium 136 - 145 mEq/L 139 134(L) 132(L)  Potassium 3.5 - 5.1 mEq/L 3.8  4.4 3.4(L)  Chloride 101 - 111 mmol/L - - 96(L)  CO2 22 - 29 mEq/L 26 26 27   Calcium 8.4 - 10.4 mg/dL 9.4 9.4 8.5(L)  Total Protein 6.4 - 8.3 g/dL 7.1 6.8 5.9(L)  Total Bilirubin 0.20 - 1.20 mg/dL 0.22 <0.22 0.6  Alkaline Phos 40 - 150 U/L 89 127 119  AST 5 - 34 U/L 7 11 17   ALT 0-55 U/L U/L <6 9 14    CEA 02/04/2016: 2.5 03/18/2016: 6.84 06/02/2016: 21.73 08/18/2016: 14.27 09/15/2016: 9.01 12/09/2016: PENDING   PATHOLOGY REPORT  05/30/2016 Diagnosis Peritoneum, biopsy, left lower quadrant mesentery ADENOCARCINOMA WITH ABUNDANT EXTRACELLULAR MUCIN CONSISTENT WITH COLONIC PRIMARY   FOUNDATION ONE [id number] 05/30/2016   Diagnosis 08/03/2015 Colon, segmental resection, Right - INVASIVE ADENOCARCINOMA WITH ABUNDANT EXTRACELLULAR MUCIN, MODERATELY DIFFERENTIATED, LIKELY ARISING FROM ILEOCECAL VALVE. - ADENOCARCINOMA EXTENDS AT LEAST INTO THE PERICOLONIC SOFT TISSUE AND IS ASSOCIATED WITH TRANSMURAL DEFECT. - THE PROXIMAL AND DISTAL RESECTION MARGINS ARE NEGATIVE FOR ADENOCARCINOMA. - ACELLULAR MUCIN IS GROSSLY PRESENT AT THE SEROSAL SURFACE. - LYMPHOVASCULAR INVASION IS IDENTIFIED. - METASTATIC CARCINOMA IN 1 OF 40 LYMPH NODES (1/40). - SEE ONCOLOGY TABLE BELOW. Microscopic Comment COLON AND RECTUM (INCLUDING TRANS-ANAL RESECTION): Specimen: Right colon and terminal ileum. Procedure: Resection. Tumor site: Likely ileocecal valve. Specimen integrity: Transmural defect(s). Macroscopic intactness of mesorectum: N/A Macroscopic tumor perforation: Present. Invasive tumor: Maximum size: At least 2.5 cm. Histologic type(s): Adenocarcinoma with abundant extracellular mucin. Histologic grade and differentiation: G2: moderately differentiated. Type of polyp in which invasive carcinoma arose: Tubular adenoma. Microscopic extension of invasive tumor: Adenocarcinoma extends into pericolonic soft tissue and acellular mucin is grossly present at the serosal surface. Lymph-Vascular invasion:  Present. Peri-neural invasion: Not identified. Tumor deposit(s) (discontinuous extramural extension): Not identified. Resection margins: Proximal margin: Negative for carcinoma. Distal margin: Negative for carcinoma. Circumferential (radial) (posterior ascending, posterior descending; lateral and posterior mid-rectum; and entire lower 1/3 rectum): Acellular mucin is present at the circumferential tissue edge. Treatment effect (neo-adjuvant therapy): N/A Additional polyp(s): Not identified. Non-neoplastic findings: No significant findings. Lymph nodes: number examined 40; number positive: 1 Pathologic Staging: at least pT4a, pN1a. Ancillary studies: MMR by IHC and MSI by PCR will be performed and the results reported separately. Additional studies can be performed upon clinician request.   ADDITIONAL INFORMATION: Mismatch Repair (MMR) Protein Immunohistochemistry (IHC) IHC Expression Result: MLH1: EQUIVOCAL MSH2: EQUIVOCAL MSH6: EQUIVOCAL PMS2: EQUIVOCAL * Internal control demonstrates intact nuclear expression Interpretation: EQUIVOCAL The tumor shows partial staining with all anibodies and hence the results are considered equivocal. Correlated with molecular based MSI testing is strongly recommended.    RADIOGRAPHIC STUDIES: I have personally reviewed the radiological images as listed and agreed with the findings in the report.  Echocardiogram 11/03/16 Left ventricle: The cavity size was normal. Wall thickness was   normal. Systolic function was normal. The estimated ejection   fraction was in the range of 55% to 60%. Mildly reduced GLPSS at   -17%. Wall motion was normal; there were no regional wall motion  abnormalities. The study is not technically sufficient to allow   evaluation of LV diastolic function.  CT CAP w/ Contrast 10/09/2016 IMPRESSION: Prominent diffuse peritoneal carcinomatosis with low-attenuation lesions seen throughout the peritoneal cavity and  causing scalloping of the margins of the liver and spleen. Some loculated fluid collections may be present, likely due to malignant ascites. Appearances are similar to previous study. There appears to be partial colectomy with remaining colon distended and filled with contrast material and gas. Contrast material in the colon suggest no evidence of complete obstruction. Segmental areas of wall thickening and edema demonstrated in the small bowel and in the sigmoid colon, likely resulting from serosal implants of carcinomatosis. Superimposed inflammatory process not entirely excluded.  CT CAP w/ Contrast 08/17/2016 IMPRESSION: 1. Interval progression of peritoneal carcinomatosis with progressive scalloping of the liver and increase in size of the peritoneal masses. 2. Multiple pulmonary nodules are again noted. All these remain stable with the exception of a left upper lobe nodule which has increased to 5 mm from 3 mm previously.  NM PET 05/20/2016 IMPRESSION: 1. Peritoneal carcinomatosis with hypermetabolic peritoneal masses throughout the perihepatic space, left upper quadrant, omentum, mesenteric and pelvic peritoneum. Small volume pelvic ascites. 2. Hypermetabolic mass in the deep subcutaneous ventral right lateral pelvic wall, probably a metastasis. 3. Five scattered pulmonary nodules in both lungs, largest 4 mm, below PET resolution, two of which in the upper lobes are new since 09/03/2015, cannot exclude pulmonary metastases. 4. Nonenlarged mildly hypermetabolic left axillary lymph node, nonspecific, favor benign injection related uptake (radiotracer was injected in a left hand peripheral IV). 5. Additional findings include aortic atherosclerosis and 2 vessel coronary atherosclerosis.  ASSESSMENT & PLAN:  51 y.o. Caucasian female, with past medical history of fibromyalgia, depression, presented with ruptured cecum and pericolonic abscess, required multiple drainage,  antibiotics, and eventually right colectomy.  1. Right colon cancer, cecum, moderately differentiated adenocarcinoma, pT4N1aM0, stage IIIA, with perforation, MSI-stable, KRAS mutation (+), peritoneum recurrence - I previously reviewed her scan findings, and surgical pathology results in great details with patient and her Husband.  - she had locally advanced stage III  Disease, with particular high risk features of T4 tumor, proliferation for 3-4 months,  And positive lymph nodes, she is at very high risk for cancer recurrence, especially peritoneal carcinomatosis. -She has completed adjuvant chemotherapy CAPOX, oxaliplatin dose was significantly reduced due to her poor tolerance. -I previously discussed her surveillance CT scan from 04/29/2016, which unfortunately showed probable peritoneal carcinomatosis. No other significant metastasis on the CT scan. We discussed that the metastasis is not definitive based on CT, and needs to be confirmed by biopsy  -I previously reviewed her PET scan from 05/20/16, which showed hypermetabolic peritoneal carcinomatosis, in a few small lung nodules which are indeterminate. -She started chemotherapy FOLFIRI and Avastin on 06/02/16, tolerated well, and her abdominal pain has improved. Unfortunately she had significant disease progression in peritoneum, which caused sigmoid colon obstruction, status post transverse colon diverting colostomy. Chemo held.  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 -We previously discussed changing to third line therapy with Atezolitumab (immunotherapy) and Cobimetinib (MEK inhibitor). This was studied in [facility] mCRC clinical trial which showed 17% PR and 22% SD. The large phase 3 clinical trial of this combination is still ongoing. Her insurance has denied the medications, we finally received free drug replacement for her, will start  Atezo today  -We again reviewed  potential side effects from Atezolitumab and Cobimetinib, she voiced good understanding. -Her cobimetinib is still pending, I recommend her to start at low dose, 14m daily for the first 3-4 days, then 40 mg daily for 3 days, if tolerates well, then increase to 4 dose 60 mg daily on second week -Again we discussed the goal of care is palliative.  -Labs reviewed and she is moderately anemic so we will give her IV Iron today -next infusion June 8th  2. Abdominal pain -Likely related to her cancer recurrence -previously improved since she started chemotherapy -Continue oxycodone 10 mg pills as needed, about 4-5 a day. I will think about putting her on long acting pain medication if this continues. -Her pain overall is controlled   3. Anemia, iron deficient anemia  - likely secondary to surgery,  Chronic infection and GI bleeding from the tumor -Previous Iron study showed low serum iron, transferrin saturation and ferritin 12, which is consistent with iron deficiency -Due to her significant worsening anemia, hemoglobin 8.7 on 06/23/16, I recommendeded IV Feraheme weekly twice . Her anemia has approved some -We'll consider blood transfusion if hemoglobin less than 8 or symptomatic anemia with hemoglobin 8-9 -She will continue oral iron -Her repeated on study on 11/04/2016 showed elevated ferritin, very low serum iron and TIBC, consistent with anemia of chronic disease, and a component of iron deficiency. I'll set up IV Feraheme once to see if her anemia improves. -Due to her slightly anemic levels today we will give her IV Iron today   4. Fibromyalgia and depression - she will continue medication, and follow-up with her primary care physician  5. Constipation -She will continue stool softener and laxative senna-s every day. -She had a colostomy placed on 10/13/16 and the stool is formed. Denies issues with colostomy.  6. Goal of care discussion  -We again  discussed the incurable nature of her cancer, and the overall poor prognosis, especially if she does not have good response to chemotherapy or progress on chemo -The patient understands the goal of care is palliative. -I recommend DNR/DNI, she will think about it   7. Weight loss and moderate malnutrition secondary to cancer.  -She has lost quite a bit of weight in the past few months, but has increased in appetite and gained 2 pounds since last visit -Due to low weight I suggested her to see our dietician. She agrees.   8. Goal of care discussion  -We again discussed the incurable nature of her cancer, and the overall poor prognosis, especially if she does not have good response to chemotherapy or progress on chemo -The patient understands the goal of care is palliative. -I recommend DNR/DNI, she will think about it ;   Plan -refill Compazine  -CT scan in one week as a new baseline scan  -will start Atezo today  -she will start cobimetinib when she receives it, from low dose  -Lab, flush, f/u and atezolizumab in 3 weeks  -Nutrition consult   All questions were answered. The patient knows to call the clinic with any problems, questions or concerns.  I spent 30 minutes counseling the patient face to face. The total time spent in the appointment was 40 minutes and more than 50% was on counseling.      FTruitt Merle MD 12/09/2016   This document serves as a record of services personally performed by YTruitt Merle MD. It was created on her behalf by AJoslyn Devon a trained medical scribe. The creation of this  record is based on the scribe's personal observations and the provider's statements to them. This document has been checked and approved by the attending provider.

## 2016-12-10 ENCOUNTER — Encounter: Payer: Self-pay | Admitting: Hematology

## 2016-12-10 LAB — CK: Creatine Kinase Total: 17 U/L — ABNORMAL LOW (ref 24–173)

## 2016-12-12 ENCOUNTER — Telehealth: Payer: Self-pay | Admitting: *Deleted

## 2016-12-12 NOTE — Telephone Encounter (Signed)
Oral Chemotherapy Pharmacist Encounter  Patient has been successfully enrolled to receive Tecentriq with $0 out of pocket cost from the manufacturer for up to one year. MD has been notified. Patient started treatment on 5/18. Patient ID: [id number]  Johny Drilling, PharmD, BCPS, BCOP 12/12/2016  9:45 AM [facility] [phone]

## 2016-12-12 NOTE — Telephone Encounter (Signed)
-----   Message from Egbert Garibaldi, RN sent at 12/09/2016  3:56 PM EDT ----- Regarding: Dr. Burr Medico chemo f/u call  Pt of Dr. Burr Medico, first time [place]

## 2016-12-12 NOTE — Telephone Encounter (Signed)
Called pt & she states she has done well with treatment so far, just a little tired the day after.  She expressed need for handicap sticker renewal & informed that we can do that when she returns.  LOS to schedulers to schedule appts.

## 2016-12-14 ENCOUNTER — Telehealth: Payer: Self-pay | Admitting: Hematology

## 2016-12-14 NOTE — Telephone Encounter (Signed)
lvm to inform pt of May and June apptsper sch msg

## 2016-12-16 ENCOUNTER — Telehealth: Payer: Self-pay | Admitting: *Deleted

## 2016-12-16 NOTE — Telephone Encounter (Signed)
Received vm message from pt stating that she has heard from the pharmacy & her med will be delivered Wed, May 30. Cobimetinib.  Message to Dr Burr Medico.

## 2016-12-20 NOTE — Progress Notes (Signed)
[place]  [phone] [phone]  Clinic follow up Note   Patient Care Team: Carol Ada, MD as PCP - General (Family Medicine) Rolm Bookbinder, MD as Consulting Physician (General Surgery) 12/23/2016   CHIEF COMPLAINTS:  Follow up metastatic colon cancer   Oncology History   Cancer of right colon [facility])   Staging form: Colon and Rectum, AJCC 7th Edition     Pathologic stage from 08/03/2015: Stage IIIB (T4a, N1a, cM0) - Signed by Truitt Merle, MD on 09/04/2015       Cancer of right colon ([place])   03/31/2015 Imaging    CT abdomen showed appendicitis, perforated, with periappendiceal abscess. And small pulmonary nodules.      04/03/2015 Procedure    CT guided placement of going Through into the right lower abdominal quadrant with aspiration of a total of 30 mL prudent fluid.       08/03/2015 Initial Diagnosis    Cancer of right colon ([place])      08/03/2015 Surgery    Right hemicolectomy      08/03/2015 Pathology Results    Right hemicolectomy showed invasive adenocarcinoma, moderately differentiated, likely arising from a cecal valve. One out of 40 lymph nodes were positive, (+) LVI, margins negative       08/03/2015 Miscellaneous    colon cance MSI-stable       09/18/2015 - 03/31/2016 Chemotherapy    Adjuvant CAPOX, she tolerated oxaliplatin poorly, which was held from cycle 2 to 4 due to poor tolerance, restarted from cycle 5 with lower dose, she also had significant hand-foot syndrome from Xeloda. She completed a total of 8 cycles.      05/20/2016 Imaging    PET scan revealed hypermetabolic peritoneal carcinomatosis, five scatted pulmonary nodules in both lungs, largest 4 mm, metastasis not excluded. Hypermetabolic mass in the deep subcutaneous ventral right lateral pelvic wall, probable metastasis.      06/02/2016 - 09/29/2016 Chemotherapy    FOLFIRI every 2 weeks, started on 06/02/2016, Avastin added from cycle 3. Held due to the  patient hospitalized for a colonic obstruction on 10/09/16.       06/26/2016 Miscellaneous    Foundation One revealed KRAS mutation (+), MSI-stable       08/17/2016 Imaging    CT CAP IMPRESSION: 1. Interval progression of peritoneal carcinomatosis with progressive scalloping of the liver and increase in size of the peritoneal masses. 2. Multiple pulmonary nodules are again noted. All these remain stable with the exception of a left upper lobe nodule which has increased to 5 mm from 3 mm previously.      10/09/2016 - 10/20/2016 Hospital Admission    The patient presented to hospital with constipation and abdominal pain. She had malignant colonic obstruction due to carcinomatosis.      10/09/2016 Imaging     CT AP W CONTRAST IMPRESSION: Prominent diffuse peritoneal carcinomatosis with low-attenuation lesions seen throughout the peritoneal cavity and causing scalloping of the margins of the liver and spleen. Some loculated fluid collections may be present, likely due to malignant ascites. Appearances are similar to previous study.  There appears to be partial colectomy with remaining colon distended and filled with contrast material and gas. Contrast material in the colon suggest no evidence of complete obstruction. Segmental areas of wall thickening and edema demonstrated in the small bowel and in the sigmoid colon, likely resulting from serosal implants of carcinomatosis. Superimposed inflammatory process not entirely excluded.      10/13/2016 Surgery  Patient s/p transverse loop colostomy per Dr. Redmond Pulling 10/13/2016.       11/03/2016 Echocardiogram    Echocardiogram 11/03/16 Left ventricle: The cavity size was normal. Wall thickness was   normal. Systolic function was normal. The estimated ejection   fraction was in the range of 55% to 60%. Mildly reduced GLPSS at   -17%. Wall motion was normal; there were no regional wall motion   abnormalities. The study is not  technically sufficient to allow   evaluation of LV diastolic function.      12/09/2016 -  Chemotherapy    Atezolitumab (immunotherapy) and Cobimetinib (MEK inhibitor) third line chemo therapy clinical trail.   Start Cobimetinib with 1 tablet a day for 3-4 days then 2 tablets daily for 3-4 days then up to 3 tablets daily to monitor tolerance for 3 weeks on and 1 week off.      12/22/2016 Imaging    CT Chest/Abd w contrast  IMPRESSION: 1. Interval progression of peritoneal carcinomatosis. 2. There is a new low-attenuation lesion within the lateral right lobe of liver worrisome for intrahepatic metastasis. 3. Small pulmonary nodules are again noted and do not appear significantly changed in the interval.       HISTORY OF PRESENTING ILLNESS:  [name] 51 y.o. female without significant PMH is here because of her recently diagnosed stage III colon cancer. She is accompanied by her boyfriend to the clinic today.  She developed fever and headache in September 2016, was found to have ruptured appendix and was admitted to hospital. CT scan revealed a peri-appendiceal abscess. She underwent abscess drainage tube basement by interventional radiology on 04/17/2015, and a course of antibiotics. She had a multiple drainage tube exchange by IR, but the abscess did not heal. She was referred to Dr. Donne [name]. Multiple CT scan in October and November showed persistent peri-appendiceal fluid collection. She was finally taken to the operation room on 08/03/2015. Intraoperatively, she was found to have a 2 cm whole present in the cecum, she underwent ileocectomy with an anastomosis in the hepatic fixture. 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  She had wound infection issue after surgery. It has been healing slowly, she is still on wet-to-dry dressing change twice daily, has home care nurse. She has moderate pain, 6-7/10, sometime postional, she is on oxycodone 2-3 times a day. Her  appetite and eating is getting better, lost about 25lb since 03/2015, no fever and chills for the past 3-4 days, just finished abx yesterday.   CURRENT THERAPY: Chemotherapy Atezolitumab (immunotherapy) every 3 weeks starting today 12/09/2016 and Cobimetinib (MEK inhibitor) 33m daily for 3 weeks then 1 week off, will start on 12/26/2016   INTERIM HISTORY:  RAbbygailreturns for follow-up and 2nd infusion. She has gained a little weight lately. Since the last infusion she has had slight fatigue and denies any skin problems. She has been eating fine. She vomits about 3-4 times. Denies any fever. She bas a back and hip aches.   MEDICAL HISTORY:  Past Medical History:  Diagnosis Date  . Anemia   . Anxiety   . Cancer ([place]    colon cancer stage 3  . Constipation   . Depression   . Fibromyalgia   . GERD (gastroesophageal reflux disease)   . JP drain bleeding     SURGICAL HISTORY: Past Surgical History:  Procedure Laterality Date  . ABDOMINAL HYSTERECTOMY    . COLON SURGERY  08/03/2015  . COLOSTOMY REVISION N/A 08/03/2015  Procedure: COLON RESECTION RIGHT;  Surgeon: Rolm Bookbinder, MD;  Location: [facility];  Service: General;  Laterality: N/A;  . GASTRIC BYPASS    . IR GENERIC HISTORICAL  05/30/2016   IR FLUORO GUIDE PORT INSERTION RIGHT 05/30/2016 Aletta Edouard, MD [facility]-INTER[facility]  . IR GENERIC HISTORICAL  05/30/2016   IR US GUIDE VASC ACCESS RIGHT 05/30/2016 Aletta Edouard, MD [facility]INTER[facility]  . LAPAROSCOPIC ILEOCECECTOMY Right 08/03/2015   Procedure: LAPAROSCOPIC DIAGNOSTIC RIGHT COLECTOMY;  Surgeon: Rolm Bookbinder, MD;  Location: [facility];  Service: General;  Laterality: Right;  . LAPAROSCOPY N/A 10/13/2016   Procedure: left upper quadrant LOOP COLOSTOMY;  Surgeon: Greer Pickerel, MD;  Location: [facility];  Service: General;  Laterality: N/A;    SOCIAL HISTORY: Social History   Social History  . Marital status: Significant Other    Spouse name: N/A  . Number of children: N/A  . Years of education:  N/A   Occupational History  . underwriter support specialist    Social History Main Topics  . Smoking status: Never Smoker  . Smokeless tobacco: Never Used  . Alcohol use 1.2 oz/week    2 Glasses of wine per week     Comment: moderate 1-2 times a month   . Drug use: No  . Sexual activity: Not on file   Other Topics Concern  . Not on file   Social History Narrative   Single, fiance [name]   Works [employer] 11:30am to 8 pm    FAMILY HISTORY: Family History  Problem Relation Age of Onset  . Cancer Father 84       small cell lung cancer and colon cancer   . Cancer Sister 50       small cell lung cancer  . Cancer Maternal Aunt        ovarian cancer  . Cancer Paternal Uncle        brain cancer   . Cancer Other 40       ovarian cancer (maternal niece)    ALLERGIES:  is allergic to latex; cymbalta [duloxetine hcl]; and penicillins.  MEDICATIONS:  Current Outpatient Prescriptions  Medication Sig Dispense Refill  . acetaminophen (TYLENOL) 500 MG tablet Take 1,000 mg by mouth every 6 (six) hours as needed for fever. Reported on 09/24/2015    . ALPRAZolam (XANAX) 0.25 MG tablet Take 1-2 tablets (0.25-0.5 mg total) by mouth at bedtime as needed for anxiety or sleep. 60 tablet 0  . cyclobenzaprine (FLEXERIL) 10 MG tablet Take 1 tablet (10 mg total) by mouth 3 (three) times daily as needed for muscle spasms. 60 tablet 0  . dexamethasone (DECADRON) 4 MG tablet Take 2 tablets (8 mg total) by mouth daily. Start day after chemo and take for 3 days after each IV chemo 30 tablet 1  . diphenhydrAMINE (BENADRYL) 25 mg capsule Take 25 mg by mouth every 6 (six) hours as needed for sleep.    . ferrous sulfate 325 (65 FE) MG tablet Take 325 mg by mouth 2 (two) times daily with a meal. Reported on 10/12/2015    . lidocaine-prilocaine (EMLA) cream Apply 1 application topically as needed. 30 g 2  . morphine (MS CONTIN) 30 MG 12 hr tablet Take 1 tablet (30 mg total) by mouth every 12  (twelve) hours. 60 tablet 0  . Oxycodone HCl 10 MG TABS Take 1 tablet (10 mg total) by mouth every 4 (four) hours. 120 tablet 0  . prochlorperazine (COMPAZINE) 10 MG tablet Take  1 tablet (10 mg total) by mouth every 6 (six) hours as needed for nausea or vomiting. 30 tablet 2  . traMADol (ULTRAM) 50 MG tablet Take 1-2 tablets (50-100 mg total) by mouth every 6 (six) hours as needed. 60 tablet 0  . vitamin B-12 (CYANOCOBALAMIN) 1000 MCG tablet Take 500 mcg by mouth daily. Reported on 09/24/2015    . vitamin C (ASCORBIC ACID) 500 MG tablet Take 500 mg by mouth daily.     No current facility-administered medications for this visit.    Facility-Administered Medications Ordered in Other Visits  Medication Dose Route Frequency Provider Last Rate Last Dose  . clindamycin (CLEOCIN) 900 mg in dextrose 5 % 50 mL IVPB  900 mg Intravenous 60 min Pre-Op Rolm Bookbinder, MD       And  . gentamicin (GARAMYCIN) 310 mg in dextrose 5 % 50 mL IVPB  5 mg/kg Intravenous 60 min Pre-Op Rolm Bookbinder, MD      . heparin lock flush 100 unit/mL  500 Units Intravenous PRN Truitt Merle, MD   500 Units at 10/04/16 1644  . sodium chloride 0.9 % injection 10 mL  10 mL Intracatheter PRN Truitt Merle, MD   10 mL at 06/23/16 1139  . sodium chloride 0.9 % injection 10 mL  10 mL Intravenous PRN Truitt Merle, MD   10 mL at 10/04/16 1644  . sodium chloride 0.9 % injection 10 mL  10 mL Intravenous PRN Truitt Merle, MD   10 mL at 12/09/16 1329    REVIEW OF SYSTEMS:  Constitutional: Denies fevers, chills or abnormal night sweats. (+) slight purposeful weight gain Eyes: Denies blurriness of vision, double vision or watery eyes Ears, nose, mouth, throat, and face: Denies mucositis or sore throat Respiratory: Denies cough, dyspnea or wheezes Cardiovascular: Denies palpitation, chest discomfort or lower extremity swelling Gastrointestinal:  Denies heartburn.  Skin: Denies abnormal skin rashes Lymphatics: Denies new lymphadenopathy or easy  bruising  Neurological:Denies numbness, tingling or new weaknesses Behavioral/Psych: Mood is stable, no new changes  Musculoskeletal: (+)back and hip pain All other systems were reviewed with the patient and are negative.  PHYSICAL EXAMINATION:  ECOG PERFORMANCE STATUS: 2 Barbara pressure 108/69, heart rate 106, respiratory rate 18, temperature 37.1, pulse ox 100% on room air GENERAL:alert, no distress and comfortable SKIN: skin color, texture, turgor are normal, no rashes or significant lesions EYES: normal, conjunctiva are pink and non-injected, sclera clear OROPHARYNX:no exudate, no erythema and lips, buccal mucosa, and tongue normal  NECK: supple, thyroid normal size, non-tender, without nodularity LYMPH:  no palpable lymphadenopathy in the cervical, axillary or inguinal   LUNGS: clear to auscultation and percussion with normal breathing effort HEART: regular rate & rhythm and no murmurs and no lower extremity edema ABDOMEN:abdomen soft, normal bowel sounds,  Musculoskeletal:no cyanosis of digits and no clubbing  PSYCH: alert & oriented x 3 with fluent speech NEURO: no focal motor/sensory deficits  LABORATORY DATA:  I have reviewed the data as listed CBC Latest Ref Rng & Units 12/23/2016 12/09/2016 11/04/2016  WBC 3.9 - 10.3 10e3/uL 4.8 7.7 10.0  Hemoglobin 11.6 - 15.9 g/dL 9.1(L) 8.4(L) 8.7(L)  Hematocrit 34.8 - 46.6 % 28.2(L) 27.0(L) 28.4(L)  Platelets 145 - 400 10e3/uL 371 498(H) 441(H)   CMP Latest Ref Rng & Units 12/23/2016 12/09/2016 11/04/2016  Glucose 70 - 140 mg/dl 97 80 86  BUN 7.0 - 26.0 mg/dL 5.6(L) 4.2(L) 6.2(L)  Creatinine 0.6 - 1.1 mg/dL 0.6 0.6 0.5(L)  Sodium 136 - 145 mEq/L 131(L)  139 134(L)  Potassium 3.5 - 5.1 mEq/L 3.9 3.8 4.4  Chloride 101 - 111 mmol/L - - -  CO2 22 - 29 mEq/L 25 26 26   Calcium 8.4 - 10.4 mg/dL 9.4 9.4 9.4  Total Protein 6.4 - 8.3 g/dL 7.2 7.1 6.8  Total Bilirubin 0.20 - 1.20 mg/dL 0.22 0.22 <0.22  Alkaline Phos 40 - 150 U/L 86 89 127  AST 5  - 34 U/L 10 7 11   ALT 0-55 U/L U/L <6 <6 9   CEA 02/04/2016: 2.5 03/18/2016: 6.84 06/02/2016: 21.73 08/18/2016: 14.27 09/15/2016: 9.01 12/09/2016: 5.01   PATHOLOGY REPORT  05/30/2016 Diagnosis Peritoneum, biopsy, left lower quadrant mesentery ADENOCARCINOMA WITH ABUNDANT EXTRACELLULAR MUCIN CONSISTENT WITH COLONIC PRIMARY   FOUND[facility] ONE [id number] 05/30/2016   Diagnosis 08/03/2015 Colon, segmental resection, Right - INVASIVE ADENOCARCINOMA WITH ABUNDANT EXTRACELLULAR MUCIN, MODERATELY DIFFERENTIATED, LIKELY ARISING FROM ILEOCECAL VALVE. - ADENOCARCINOMA EXTENDS AT LEAST INTO THE PERICOLONIC SOFT TISSUE AND IS ASSOCIATED WITH TRANSMURAL DEFECT. - THE PROXIMAL AND DISTAL RESECTION MARGINS ARE NEGATIVE FOR ADENOCARCINOMA. - ACELLULAR MUCIN IS GROSSLY PRESENT AT THE SEROSAL SURFACE. - LYMPHOVASCULAR INVASION IS IDENTIFIED. - METASTATIC CARCINOMA IN 1 OF 40 LYMPH NODES (1/40). - SEE ONCOLOGY TABLE BELOW. Microscopic Comment COLON AND RECTUM (INCLUDING TRANS-ANAL RESECTION): Specimen: Right colon and terminal ileum. Procedure: Resection. Tumor site: Likely ileocecal valve. Specimen integrity: Transmural defect(s). Macroscopic intactness of mesorectum: N/A Macroscopic tumor perforation: Present. Invasive tumor: Maximum size: At least 2.5 cm. Histologic type(s): Adenocarcinoma with abundant extracellular mucin. Histologic grade and differentiation: G2: moderately differentiated. Type of polyp in which invasive carcinoma arose: Tubular adenoma. Microscopic extension of invasive tumor: Adenocarcinoma extends into pericolonic soft tissue and acellular mucin is grossly present at the serosal surface. Lymph-Vascular invasion: Present. Peri-neural invasion: Not identified. Tumor deposit(s) (discontinuous extramural extension): Not identified. Resection margins: Proximal margin: Negative for carcinoma. Distal margin: Negative for carcinoma. Circumferential (radial) (posterior  ascending, posterior descending; lateral and posterior mid-rectum; and entire lower 1/3 rectum): Acellular mucin is present at the circumferential tissue edge. Treatment effect (neo-adjuvant therapy): N/A Additional polyp(s): Not identified. Non-neoplastic findings: No significant findings. Lymph nodes: number examined 40; number positive: 1 Pathologic Staging: at least pT4a, pN1a. Ancillary studies: MMR by IHC and MSI by PCR will be performed and the results reported separately. Additional studies can be performed upon clinician request.   ADDITIONAL INFORMATION: Mismatch Repair (MMR) Protein Immunohistochemistry (IHC) IHC Expression Result: MLH1: EQUIVOCAL MSH2: EQUIVOCAL MSH6: EQUIVOCAL PMS2: EQUIVOCAL * Internal control demonstrates intact nuclear expression Interpretation: EQUIVOCAL The tumor shows partial staining with all anibodies and hence the results are considered equivocal. Correlated with molecular based MSI testing is strongly recommended.    RADIOGRAPHIC STUDIES: I have personally reviewed the radiological images as listed and agreed with the findings in the report.  CT Chest/Abd w contrast 12/22/2016 IMPRESSION: 1. Interval progression of peritoneal carcinomatosis. 2. There is a new low-attenuation lesion within the lateral right lobe of liver worrisome for intrahepatic metastasis. 3. Small pulmonary nodules are again noted and do not appear significantly changed in the interval.   ASSESSMENT & PLAN:  51 y.o. Caucasian female, with past medical history of fibromyalgia, depression, presented with ruptured cecum and pericolonic abscess, required multiple drainage, antibiotics, and eventually right colectomy.  1. Right colon cancer, cecum, moderately differentiated adenocarcinoma, pT4N1aM0, stage IIIA, with perforation, MSI-stable, KRAS mutation (+), peritoneum recurrence - I previously reviewed her scan findings, and surgical pathology results in great details  with patient and her Husband.  - she had locally advanced  stage III  Disease, with particular high risk features of T4 tumor, proliferation for 3-4 months,  And positive lymph nodes, she is at very high risk for cancer recurrence, especially peritoneal carcinomatosis. -She has completed adjuvant chemotherapy CAPOX, oxaliplatin dose was significantly reduced due to her poor tolerance. -I previously discussed her surveillance CT scan from 04/29/2016, which unfortunately showed probable peritoneal carcinomatosis. No other significant metastasis on the CT scan. We discussed that the metastasis is not definitive based on CT, and needs to be confirmed by biopsy  -I previously reviewed her PET scan from 05/20/16, which showed hypermetabolic peritoneal carcinomatosis, in a few small lung nodules which are indeterminate. -She started chemotherapy FOLFIRI and Avastin on 06/02/16, tolerated well, and her abdominal pain has improved. Unfortunately she had significant disease progression in peritoneum, which caused sigmoid colon obstruction, status post transverse colon diverting colostomy. Chemo held.  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 -she has started third line therapy with Atezolitumab (immunotherapy) and Cobimetinib (MEK inhibitor). This was studied in [facility] mCRC clinical trial which showed 17% PR and 22% SD. The large phase 3 clinical trial of this combination is still ongoing. Her insurance has denied the medications, we finally received free drug replacement for her.  -Lab reviewed, adequate for treatment, she will proceed to second dose Ateo today  -Her cobimetinib is finally coming, she will start next week, I recommend her to start at low dose, 79m daily for the first 3-4 days, then 40 mg daily for 3 days, if tolerates well, then increase to full dose 60 mg daily on second week, 3 weeks on  and one week off  -I reviewed her restaging CT scan from yesterday, which on surprisingly showed disease progression. This will be her baseline scan to evaluate her response to immunotherapy and cobimetinib  - f/u in 2 weeks. If doing well, we will continue  2. Abdominal pain -Likely related to her cancer recurrence -previously improved since she started chemotherapy -Continue oxycodone 10 mg pills as needed, about 4-5 a day. I will think about putting her on long acting pain medication if this continues. -She has been taking oxycodone 50-60 mg daily, I recommend her to add long-acting pain medication. Prescribe MS contin 177m twice daily. On 6/1 for pain  3. Anemia, iron deficient anemia  - likely secondary to surgery,  Chronic infection and GI bleeding from the tumor -Previous Iron study showed low serum iron, transferrin saturation and ferritin 12, which is consistent with iron deficiency -Due to her significant worsening anemia, hemoglobin 8.7 on 06/23/16, I recommendeded IV Feraheme weekly twice . Her anemia has approved some -We'll consider blood transfusion if hemoglobin less than 8 or symptomatic anemia with hemoglobin 8-9 -She will continue oral iron -Her repeated on study on 11/04/2016 showed elevated ferritin, very low serum iron and TIBC, consistent with anemia of chronic disease, and a component of iron deficiency. I'll set up IV Feraheme once to see if her anemia improves. -Due to her slightly anemic levels we previously gave her IV Iron    4. Fibromyalgia and depression - she will continue medication, and follow-up with her primary care physician - I refilled Xanax on 6/1  5. Constipation -She will continue stool softener and laxative senna-s every day. -She had a colostomy placed on 10/13/16 and the stool is formed. Denies issues with colostomy.  6. Goal of care discussion  -We again discussed the  incurable nature of her cancer, and the overall poor prognosis, especially  if she does not have good response to chemotherapy or progress on chemo -The patient understands the goal of care is palliative. -I previously recommend DNR/DNI, she will think about it   7. Weight loss and moderate malnutrition secondary to cancer.  -She has lost quite a bit of weight in the past few months, but has increased in appetite and gained 2 pounds since last visit -Due to low weight I previously suggested her to see our dietician. She agreed.  - she has been gaining weight lately.  Plan - labs reviewed. She is adequate to continue with treatment today, second cycle Atezo  -  Prescribe MS contin 79m, twice daily. - refill xanax and oxycodone  - f/u in 2 weeks.  All questions were answered. The patient knows to call the clinic with any problems, questions or concerns.  I spent 20 minutes counseling the patient face to face. The total time spent in the appointment was 30 minutes and more than 50% was on counseling.      FTruitt Merle MD 12/23/2016   This document serves as a record of services personally performed by YTruitt Merle MD. It was created on her behalf by TBrandt Loosen a trained medical scribe. The creation of this record is based on the scribe's personal observations and the provider's statements to them. This document has been checked and approved by the attending provider.

## 2016-12-21 ENCOUNTER — Encounter: Payer: 59 | Admitting: Nutrition

## 2016-12-22 ENCOUNTER — Ambulatory Visit (HOSPITAL_COMMUNITY)
Admission: RE | Admit: 2016-12-22 | Discharge: 2016-12-22 | Disposition: A | Discharge status: Home / Self Care | Payer: 59 | Source: Ambulatory Visit | Attending: Hematology | Admitting: Hematology

## 2016-12-22 ENCOUNTER — Encounter (HOSPITAL_COMMUNITY): Payer: Self-pay

## 2016-12-22 DIAGNOSIS — C786 Secondary malignant neoplasm of retroperitoneum and peritoneum: Secondary | ICD-10-CM | POA: Insufficient documentation

## 2016-12-22 DIAGNOSIS — R918 Other nonspecific abnormal finding of lung field: Secondary | ICD-10-CM | POA: Diagnosis not present

## 2016-12-22 DIAGNOSIS — I700 Atherosclerosis of aorta: Secondary | ICD-10-CM | POA: Insufficient documentation

## 2016-12-22 DIAGNOSIS — C189 Malignant neoplasm of colon, unspecified: Secondary | ICD-10-CM | POA: Insufficient documentation

## 2016-12-22 DIAGNOSIS — K439 Ventral hernia without obstruction or gangrene: Secondary | ICD-10-CM | POA: Diagnosis not present

## 2016-12-22 DIAGNOSIS — I2510 Atherosclerotic heart disease of native coronary artery without angina pectoris: Secondary | ICD-10-CM | POA: Insufficient documentation

## 2016-12-22 DIAGNOSIS — C182 Malignant neoplasm of ascending colon: Secondary | ICD-10-CM

## 2016-12-22 MED ORDER — HEPARIN SOD (PORK) LOCK FLUSH 100 UNIT/ML IV SOLN
500.0000 [IU] | Freq: Once | INTRAVENOUS | Status: AC
  Administered 2016-12-22: 500 [IU] via INTRAVENOUS

## 2016-12-22 MED ORDER — IOPAMIDOL (ISOVUE-300) INJECTION 61%
INTRAVENOUS | Status: AC
  Administered 2016-12-22: 30 mL via ORAL
  Filled 2016-12-22: qty 30

## 2016-12-22 MED ORDER — HEPARIN SOD (PORK) LOCK FLUSH 100 UNIT/ML IV SOLN
INTRAVENOUS | Status: AC
  Filled 2016-12-22: qty 5

## 2016-12-22 MED ORDER — IOPAMIDOL (ISOVUE-300) INJECTION 61%
INTRAVENOUS | Status: AC
  Administered 2016-12-22: 100 mL via INTRAVENOUS
  Filled 2016-12-22: qty 100

## 2016-12-22 MED ORDER — IOPAMIDOL (ISOVUE-300) INJECTION 61%
INTRAVENOUS | Status: AC
  Filled 2016-12-22: qty 100

## 2016-12-22 MED ORDER — IOPAMIDOL (ISOVUE-300) INJECTION 61%
100.0000 mL | Freq: Once | INTRAVENOUS | Status: AC | PRN
Start: 2016-12-22 — End: 2016-12-22
  Administered 2016-12-22: 100 mL via INTRAVENOUS

## 2016-12-22 MED ORDER — IOPAMIDOL (ISOVUE-300) INJECTION 61%
30.0000 mL | Freq: Once | INTRAVENOUS | Status: AC | PRN
  Administered 2016-12-22: 30 mL via ORAL

## 2016-12-23 ENCOUNTER — Ambulatory Visit: Payer: 59

## 2016-12-23 ENCOUNTER — Other Ambulatory Visit (HOSPITAL_BASED_OUTPATIENT_CLINIC_OR_DEPARTMENT_OTHER): Payer: 59

## 2016-12-23 ENCOUNTER — Ambulatory Visit (HOSPITAL_BASED_OUTPATIENT_CLINIC_OR_DEPARTMENT_OTHER): Payer: 59

## 2016-12-23 ENCOUNTER — Ambulatory Visit (HOSPITAL_BASED_OUTPATIENT_CLINIC_OR_DEPARTMENT_OTHER): Payer: 59 | Admitting: Hematology

## 2016-12-23 ENCOUNTER — Ambulatory Visit: Payer: 59 | Admitting: Nutrition

## 2016-12-23 VITALS — BP 108/69 | HR 106 | Temp 98.7°F | Resp 18

## 2016-12-23 DIAGNOSIS — C786 Secondary malignant neoplasm of retroperitoneum and peritoneum: Secondary | ICD-10-CM | POA: Diagnosis not present

## 2016-12-23 DIAGNOSIS — R634 Abnormal weight loss: Secondary | ICD-10-CM | POA: Diagnosis not present

## 2016-12-23 DIAGNOSIS — F329 Major depressive disorder, single episode, unspecified: Secondary | ICD-10-CM | POA: Diagnosis not present

## 2016-12-23 DIAGNOSIS — C182 Malignant neoplasm of ascending colon: Secondary | ICD-10-CM

## 2016-12-23 DIAGNOSIS — E440 Moderate protein-calorie malnutrition: Secondary | ICD-10-CM | POA: Diagnosis not present

## 2016-12-23 DIAGNOSIS — M791 Myalgia: Secondary | ICD-10-CM

## 2016-12-23 DIAGNOSIS — C779 Secondary and unspecified malignant neoplasm of lymph node, unspecified: Secondary | ICD-10-CM

## 2016-12-23 DIAGNOSIS — R911 Solitary pulmonary nodule: Secondary | ICD-10-CM

## 2016-12-23 DIAGNOSIS — Z95828 Presence of other vascular implants and grafts: Secondary | ICD-10-CM

## 2016-12-23 DIAGNOSIS — K5900 Constipation, unspecified: Secondary | ICD-10-CM | POA: Diagnosis not present

## 2016-12-23 DIAGNOSIS — R109 Unspecified abdominal pain: Secondary | ICD-10-CM | POA: Diagnosis not present

## 2016-12-23 DIAGNOSIS — D500 Iron deficiency anemia secondary to blood loss (chronic): Secondary | ICD-10-CM

## 2016-12-23 DIAGNOSIS — Z5112 Encounter for antineoplastic immunotherapy: Secondary | ICD-10-CM | POA: Diagnosis not present

## 2016-12-23 DIAGNOSIS — D649 Anemia, unspecified: Secondary | ICD-10-CM

## 2016-12-23 DIAGNOSIS — D509 Iron deficiency anemia, unspecified: Secondary | ICD-10-CM

## 2016-12-23 LAB — COMPREHENSIVE METABOLIC PANEL
ALT: <6 U/L (ref 0–55)
AST: 10 U/L (ref 5–34)
Albumin: 2.9 g/dL — ABNORMAL LOW (ref 3.5–5.0)
Alkaline Phosphatase: 86 U/L (ref 40–150)
Anion Gap: 9 mEq/L (ref 3–11)
BUN: 5.6 mg/dL — AB (ref 7.0–26.0)
CHLORIDE: 97 meq/L — AB (ref 98–109)
CO2: 25 mEq/L (ref 22–29)
Calcium: 9.4 mg/dL (ref 8.4–10.4)
Creatinine: 0.6 mg/dL (ref 0.6–1.1)
GLUCOSE: 97 mg/dL (ref 70–140)
POTASSIUM: 3.9 meq/L (ref 3.5–5.1)
SODIUM: 131 meq/L — AB (ref 136–145)
Total Bilirubin: 0.22 mg/dL (ref 0.20–1.20)
Total Protein: 7.2 g/dL (ref 6.4–8.3)

## 2016-12-23 LAB — CBC WITH DIFFERENTIAL/PLATELET
BASO%: 1.2 % (ref 0.0–2.0)
Basophils Absolute: 0.1 10*3/uL (ref 0.0–0.1)
EOS ABS: 0.1 10*3/uL (ref 0.0–0.5)
EOS%: 1.9 % (ref 0.0–7.0)
HEMATOCRIT: 28.2 % — AB (ref 34.8–46.6)
HGB: 9.1 g/dL — ABNORMAL LOW (ref 11.6–15.9)
LYMPH#: 0.9 10*3/uL (ref 0.9–3.3)
LYMPH%: 18.7 % (ref 14.0–49.7)
MCH: 24.6 pg — ABNORMAL LOW (ref 25.1–34.0)
MCHC: 32.1 g/dL (ref 31.5–36.0)
MCV: 76.4 fL — AB (ref 79.5–101.0)
MONO#: 0.6 10*3/uL (ref 0.1–0.9)
MONO%: 12.3 % (ref 0.0–14.0)
NEUT%: 65.9 % (ref 38.4–76.8)
NEUTROS ABS: 3.1 10*3/uL (ref 1.5–6.5)
PLATELETS: 371 10*3/uL (ref 145–400)
RBC: 3.7 10*6/uL (ref 3.70–5.45)
RDW: 16.4 % — ABNORMAL HIGH (ref 11.2–14.5)
WBC: 4.8 10*3/uL (ref 3.9–10.3)

## 2016-12-23 MED ORDER — SODIUM CHLORIDE 0.9 % IJ SOLN
10.0000 mL | INTRAMUSCULAR | Status: DC | PRN
  Administered 2016-12-23: 10 mL via INTRAVENOUS
  Filled 2016-12-23: qty 10

## 2016-12-23 MED ORDER — HEPARIN SOD (PORK) LOCK FLUSH 100 UNIT/ML IV SOLN
500.0000 [IU] | Freq: Once | INTRAVENOUS | Status: AC | PRN
  Administered 2016-12-23: 500 [IU]
  Filled 2016-12-23: qty 5

## 2016-12-23 MED ORDER — ALPRAZOLAM 0.25 MG PO TABS: 0.2500 mg | ORAL_TABLET | Freq: Every evening | ORAL | 0 refills | Status: DC | PRN

## 2016-12-23 MED ORDER — OXYCODONE HCL 10 MG PO TABS: 10.0000 mg | ORAL_TABLET | ORAL | 0 refills | Status: DC

## 2016-12-23 MED ORDER — SODIUM CHLORIDE 0.9% FLUSH
10.0000 mL | INTRAVENOUS | Status: DC | PRN
  Administered 2016-12-23: 10 mL
  Filled 2016-12-23: qty 10

## 2016-12-23 MED ORDER — MORPHINE SULFATE ER 30 MG PO TBCR: 30.0000 mg | EXTENDED_RELEASE_TABLET | Freq: Two times a day (BID) | ORAL | 0 refills | Status: DC

## 2016-12-23 MED ORDER — SODIUM CHLORIDE 0.9 % IV SOLN
Freq: Once | INTRAVENOUS | Status: AC
  Administered 2016-12-23: 10:00:00 via INTRAVENOUS

## 2016-12-23 MED ORDER — ATEZOLIZUMAB CHEMO INJECTION 1200 MG/20ML
800.0000 mg | Freq: Once | INTRAVENOUS | Status: AC
  Administered 2016-12-23: 800 mg via INTRAVENOUS
  Filled 2016-12-23: qty 13.33

## 2016-12-23 NOTE — Progress Notes (Signed)
Nutrition follow-up completed with patient receiving treatment for stage III colon cancer. Weight has decreased was documented as 110.9 pounds on May 18, down from 139.3 pounds one year ago. Usual body weight of 148 pounds in September 2016. Patient reports very poor appetite and attributes this to her gastric bypass in 2007. She has not been drinking a protein supplement. Patient has lost 13% of body weight in the past 5 months.  Nutrition diagnosis:  Food and nutrition related knowledge deficit continues.  Patient meets criteria for severe malnutrition in the context of chronic illness secondary to greater than 7.5% weight loss over 3 months and less than 75% energy intake for greater than one month. Patient also shows severe depletion of body fat and muscle mass.  Intervention: Educated patient on the importance of increasing oral intake in small frequent meals and snacks. Reviewed importance of high protein foods throughout the day. Recommended patient begin protein shakes one to 2 daily. Encouraged weight maintenance. I provided patient with fact sheets.  I answered her questions.  Teach back method was used.  Contact information was provided.  Monitoring, evaluation, goals: Patient will tolerate increased calories and protein to minimize further weight loss.  Next visit: To be scheduled as needed.  Patient agrees to call me with questions or concerns.  **Disclaimer: This note was dictated with voice recognition software. Similar sounding words can inadvertently be transcribed and this note may contain transcription errors which may not have been corrected upon publication of note.**

## 2016-12-23 NOTE — Patient Instructions (Addendum)
[facility] Discharge Instructions for Patients Receiving Chemotherapy  Today you received the following chemotherapy agents: Tecentriq  To help prevent nausea and vomiting after your treatment, we encourage you to take your nausea medication as directed.    If you develop nausea and vomiting that is not controlled by your nausea medication, call the clinic.   BELOW ARE SYMPTOMS THAT SHOULD BE REPORTED IMMEDIATELY:  *FEVER GREATER THAN 100.5 F  *CHILLS WITH OR WITHOUT FEVER  NAUSEA AND VOMITING THAT IS NOT CONTROLLED WITH YOUR NAUSEA MEDICATION  *UNUSUAL SHORTNESS OF BREATH  *UNUSUAL BRUISING OR BLEEDING  TENDERNESS IN MOUTH AND THROAT WITH OR WITHOUT PRESENCE OF ULCERS  *URINARY PROBLEMS  *BOWEL PROBLEMS  UNUSUAL RASH Items with * indicate a potential emergency and should be followed up as soon as possible.  Feel free to call the clinic you have any questions or concerns. The clinic phone number is [phone].  Please show the CHEMO ALERT CARD at check-in to the Emergency Department and triage nurse.   

## 2016-12-24 ENCOUNTER — Encounter: Payer: Self-pay | Admitting: Hematology

## 2016-12-24 LAB — CK: Creatine Kinase Total: 12 U/L — ABNORMAL LOW (ref 24–173)

## 2016-12-26 ENCOUNTER — Encounter: Payer: 59 | Admitting: Nutrition

## 2016-12-30 ENCOUNTER — Ambulatory Visit: Payer: 59

## 2016-12-30 ENCOUNTER — Ambulatory Visit: Payer: 59 | Admitting: Hematology

## 2016-12-30 ENCOUNTER — Other Ambulatory Visit: Payer: 59

## 2017-01-07 ENCOUNTER — Emergency Department (HOSPITAL_COMMUNITY)
Admission: EM | Admit: 2017-01-07 | Discharge: 2017-01-08 | Disposition: A | Discharge status: Home / Self Care | Payer: 59 | Attending: Emergency Medicine | Admitting: Emergency Medicine

## 2017-01-07 ENCOUNTER — Encounter (HOSPITAL_COMMUNITY): Payer: Self-pay | Admitting: Emergency Medicine

## 2017-01-07 DIAGNOSIS — K9400 Colostomy complication, unspecified: Secondary | ICD-10-CM

## 2017-01-07 DIAGNOSIS — Z85038 Personal history of other malignant neoplasm of large intestine: Secondary | ICD-10-CM | POA: Diagnosis not present

## 2017-01-07 DIAGNOSIS — K9409 Other complications of colostomy: Secondary | ICD-10-CM | POA: Insufficient documentation

## 2017-01-07 DIAGNOSIS — Z79899 Other long term (current) drug therapy: Secondary | ICD-10-CM | POA: Insufficient documentation

## 2017-01-07 NOTE — ED Triage Notes (Signed)
Pt from home with complaints of a prolapsed x about 1 week. Pt states she has had ostomy since March 2018. Pt is currently undergoing chemo, last chemo treatment was the 5th of June. Pt states she also has skin irritation under the ostomy bag that is causing her 10/10 pain and she has slight bleeding under the bag.

## 2017-01-08 ENCOUNTER — Emergency Department (HOSPITAL_COMMUNITY): Payer: 59

## 2017-01-08 LAB — CBC WITH DIFFERENTIAL/PLATELET
BASOS PCT: 0 %
Basophils Absolute: 0 10*3/uL (ref 0.0–0.1)
Eosinophils Absolute: 0.2 10*3/uL (ref 0.0–0.7)
Eosinophils Relative: 2 %
HEMATOCRIT: 28 % — AB (ref 36.0–46.0)
HEMOGLOBIN: 8.3 g/dL — AB (ref 12.0–15.0)
LYMPHS ABS: 2.6 10*3/uL (ref 0.7–4.0)
Lymphocytes Relative: 28 %
MCH: 23.5 pg — ABNORMAL LOW (ref 26.0–34.0)
MCHC: 29.6 g/dL — ABNORMAL LOW (ref 30.0–36.0)
MCV: 79.3 fL (ref 78.0–100.0)
MONOS PCT: 8 %
Monocytes Absolute: 0.7 10*3/uL (ref 0.1–1.0)
NEUTROS ABS: 5.8 10*3/uL (ref 1.7–7.7)
NEUTROS PCT: 62 %
Platelets: 279 10*3/uL (ref 150–400)
RBC: 3.53 MIL/uL — AB (ref 3.87–5.11)
RDW: 15.5 % (ref 11.5–15.5)
WBC: 9.3 10*3/uL (ref 4.0–10.5)

## 2017-01-08 LAB — BASIC METABOLIC PANEL
Anion gap: 9 (ref 5–15)
BUN: 9 mg/dL (ref 6–20)
CHLORIDE: 103 mmol/L (ref 101–111)
CO2: 24 mmol/L (ref 22–32)
Calcium: 8.7 mg/dL — ABNORMAL LOW (ref 8.9–10.3)
Creatinine, Ser: 0.47 mg/dL (ref 0.44–1.00)
GFR calc non Af Amer: >60 mL/min (ref >60)
Glucose, Bld: 91 mg/dL (ref 65–99)
POTASSIUM: 4 mmol/L (ref 3.5–5.1)
Sodium: 136 mmol/L (ref 135–145)

## 2017-01-08 MED ORDER — OXYCODONE HCL 5 MG PO TABS
10.0000 mg | ORAL_TABLET | Freq: Once | ORAL | Status: AC
  Administered 2017-01-08: 10 mg via ORAL
  Filled 2017-01-08: qty 2

## 2017-01-08 NOTE — Discharge Instructions (Signed)
Return to the emergency room as needed for worsening symptoms or new concerns.

## 2017-01-14 NOTE — ED Provider Notes (Signed)
[facility] DEPT Provider Note   CSN: [id number] Arrival date & time: 01/07/17  2051     History   Chief Complaint Chief Complaint  Patient presents with  . prolapsed stoma  . skin irritation    HPI [name] is a 51 y.o. female.  Patient presents with complaint of skin irritation from the adhesive of her colostomy bag. She has had significant swelling to the stoma, possible prolapse, causing the bonding of the bag to be loosened. She reports having to change her bag up to 3 times daily instead of one every week or so. She denies abdominal pain. The stoma does have some minimal bleeding associated with the swelling, but stool remains normal in color and producing normal quantities. No fever.    The history is provided by the patient. No language interpreter was used.    Past Medical History:  Diagnosis Date  . Anemia   . Anxiety   . Cancer ([place])    colon cancer stage 3  . Constipation   . Depression   . Fibromyalgia   . GERD (gastroesophageal reflux disease)   . JP drain bleeding     Patient Active Problem List   Diagnosis Date Noted  . Goals of care, counseling/discussion 11/04/2016  . Abdominal pain 10/10/2016  . UTI (urinary tract infection) 10/10/2016  . Hyperglycemia 10/10/2016  . Uncontrolled pain 10/10/2016  . Peritoneal carcinomatosis ([place]) 10/10/2016  . Malignant ascites 10/10/2016  . Malignant neoplasm of colon ([place])   . SBO (small bowel obstruction) ([place])   . Port catheter in place 06/23/2016  . Hand foot syndrome 01/08/2016  . Open abdominal wall wound 10/12/2015  . Hyperphosphatemia 09/22/2015  . Hypoalbuminemia due to protein-calorie malnutrition ([place]) 09/22/2015  . Insomnia 09/22/2015  . Dehydration 09/21/2015  . Hyponatremia 09/21/2015  . Hypotension 09/21/2015  . Transaminitis 09/21/2015  . Anemia, iron deficiency 09/19/2015  . Cancer of right colon ([place]) 09/04/2015  . S/P colectomy 08/03/2015  . Perforated appendicitis 04/02/2015   . Appendiceal abscess     Past Surgical History:  Procedure Laterality Date  . ABDOMINAL HYSTERECTOMY    . COLON SURGERY  08/03/2015  . COLOSTOMY REVISION N/A 08/03/2015   Procedure: COLON RESECTION RIGHT;  Surgeon: Rolm Bookbinder, MD;  Location: [facility];  Service: General;  Laterality: N/A;  . GASTRIC BYPASS    . IR GENERIC HISTORICAL  05/30/2016   IR FLUORO GUIDE PORT INSERTION RIGHT 05/30/2016 Aletta Edouard, MD [facility]  . IR GENERIC HISTORICAL  05/30/2016   IR US GUIDE VASC ACCESS RIGHT 05/30/2016 Aletta Edouard, MD [facility]  . LAPAROSCOPIC ILEOCECECTOMY Right 08/03/2015   Procedure: LAPAROSCOPIC DIAGNOSTIC RIGHT COLECTOMY;  Surgeon: Rolm Bookbinder, MD;  Location: [facility];  Service: General;  Laterality: Right;  . LAPAROSCOPY N/A 10/13/2016   Procedure: left upper quadrant LOOP COLOSTOMY;  Surgeon: Greer Pickerel, MD;  Location: [facility];  Service: General;  Laterality: N/A;    OB History    No data available       Home Medications    Prior to Admission medications   Medication Sig Start Date End Date Taking? Authorizing Provider  ALPRAZolam (XANAX) 0.25 MG tablet Take 1-2 tablets (0.25-0.5 mg total) by mouth at bedtime as needed for anxiety or sleep. 12/23/16  Yes Truitt Merle, MD  cyclobenzaprine (FLEXERIL) 10 MG tablet Take 1 tablet (10 mg total) by mouth 3 (three) times daily as needed for muscle spasms. 03/18/16  Yes Truitt Merle, MD  morphine (MS CONTIN) 30 MG  12 hr tablet Take 1 tablet (30 mg total) by mouth every 12 (twelve) hours. 12/23/16  Yes Truitt Merle, MD  Oxycodone HCl 10 MG TABS Take 1 tablet (10 mg total) by mouth every 4 (four) hours. 12/23/16  Yes Truitt Merle, MD  prochlorperazine (COMPAZINE) 10 MG tablet Take 1 tablet (10 mg total) by mouth every 6 (six) hours as needed for nausea or vomiting. 12/09/16  Yes Truitt Merle, MD  traMADol (ULTRAM) 50 MG tablet Take 1-2 tablets (50-100 mg total) by mouth every 6 (six) hours as needed. 04/20/16  Yes Truitt Merle, MD  lidocaine-prilocaine  (EMLA) cream Apply 1 application topically as needed. 05/23/16   Truitt Merle, MD    Family History Family History  Problem Relation Age of Onset  . Cancer Father 88       small cell lung cancer and colon cancer   . Cancer Sister 50       small cell lung cancer  . Cancer Maternal Aunt        ovarian cancer  . Cancer Paternal Uncle        brain cancer   . Cancer Other 40       ovarian cancer (maternal niece)    Social History Social History  Substance Use Topics  . Smoking status: Never Smoker  . Smokeless tobacco: Never Used  . Alcohol use 1.2 oz/week    2 Glasses of wine per week     Comment: moderate 1-2 times a month      Allergies   Latex; Cymbalta [duloxetine hcl]; and Penicillins   Review of Systems Review of Systems  Constitutional: Negative for chills and fever.  Gastrointestinal: Negative.  Negative for abdominal pain.       See HPI.  Musculoskeletal: Negative.   Skin: Positive for wound.  Neurological: Negative.      Physical Exam Updated Vital Signs BP 109/79 (BP Location: Left Arm)   Pulse 94   Temp 98.5 F (36.9 C) (Oral)   Resp 16   Ht 5' (1.524 m)   Wt 49.9 kg (110 lb)   SpO2 92%   BMI 21.48 kg/m   Physical Exam  Constitutional: She is oriented to person, place, and time. She appears well-developed and well-nourished.  Neck: Normal range of motion.  Pulmonary/Chest: Effort normal.  Abdominal:  LUQ colostomy in place. Stoma is large, without bleeding and non-tender. There is light brown stool in the collection bag.   Neurological: She is alert and oriented to person, place, and time.  Skin: Skin is warm and dry.     ED Treatments / Results  Labs (all labs ordered are listed, but only abnormal results are displayed) Labs Reviewed  CBC WITH DIFFERENTIAL/PLATELET - Abnormal; Notable for the following:       Result Value   RBC 3.53 (*)    Hemoglobin 8.3 (*)    HCT 28.0 (*)    MCH 23.5 (*)    MCHC 29.6 (*)    All other components  within normal limits  BASIC METABOLIC PANEL - Abnormal; Notable for the following:    Calcium 8.7 (*)    All other components within normal limits    EKG  EKG Interpretation None       Radiology No results found.  Procedures Procedures (including critical care time)  Medications Ordered in ED Medications  oxyCODONE (Oxy IR/ROXICODONE) immediate release tablet 10 mg (10 mg Oral Given 01/08/17 0039)     Initial Impression / Assessment and  Plan / ED Course  I have reviewed the triage vital signs and the nursing notes.  Pertinent labs & imaging results that were available during my care of the patient were reviewed by me and considered in my medical decision making (see chart for details).     Patient having difficulty with colostomy fit secondary to stomal swelling. Charge nurse Sharrie Rothman Ward RN) worked with the patient to develop a new template to accommodate with wider stoma. Patient has all needed supplies.   She will need to follow up with her surgeon for further evaluation of colostomy complication, specifically for swelling of the stoma vs prolapse.   She is comfortable with discharge home and outpatient follow up.  Final Clinical Impressions(s) / ED Diagnoses   Final diagnoses:  Colostomy complication [facility])    New Prescriptions Discharge Medication List as of 01/08/2017  4:55 AM       Charlann Lange, PA-C 01/14/17 [place], [place], DO 01/14/17 [phone]

## 2017-01-18 ENCOUNTER — Other Ambulatory Visit: Payer: Self-pay | Admitting: *Deleted

## 2017-01-18 ENCOUNTER — Telehealth: Payer: Self-pay | Admitting: Hematology

## 2017-01-18 ENCOUNTER — Telehealth: Payer: Self-pay | Admitting: *Deleted

## 2017-01-18 NOTE — Telephone Encounter (Signed)
Lab, flush Chemo and follow up with Dr Burr Medico scheduled for 0/02/18, per Thu/Desk nurse. Appointments not scheduled prior due to Appointment visit was [place], per [facility]. 12/23/16 Left message on patient's voicemail per appointment details.

## 2017-01-18 NOTE — Telephone Encounter (Signed)
Pt called and left message wanting to know when her next chemo and office visit appts will be.   Spoke with pt and was informed that pt had completed 21 days of oral chemo Cotellic on 01/26/87.   Pt informed nurse that Dr. Burr Medico instructed pt to start Cotellic 20 mg as followed : Take   Cotellic  20 mg  -  1 tab daily  For  3  Days,  Then            Cotellic  20 mg  -  2 tabs daily  For  3  Days,  Then            Cotellic  20 mg  -  3 tabs daily until completion of 21 days  ( 01/12/17 ).   Pt is currently on break period. Pt has 5 tablets of Cotellic  Left. Gave pt appts for Monday  01/23/17.   Informed pt that Dr. Burr Medico will give further instructions on Monday.

## 2017-01-23 ENCOUNTER — Ambulatory Visit: Payer: 59

## 2017-01-23 ENCOUNTER — Ambulatory Visit (HOSPITAL_BASED_OUTPATIENT_CLINIC_OR_DEPARTMENT_OTHER): Payer: 59

## 2017-01-23 ENCOUNTER — Encounter: Payer: Self-pay | Admitting: Hematology

## 2017-01-23 ENCOUNTER — Ambulatory Visit (HOSPITAL_BASED_OUTPATIENT_CLINIC_OR_DEPARTMENT_OTHER): Payer: 59 | Admitting: Hematology

## 2017-01-23 ENCOUNTER — Telehealth: Payer: Self-pay | Admitting: Hematology

## 2017-01-23 VITALS — BP 140/108 | HR 106 | Temp 97.8°F | Resp 17 | Ht 60.0 in | Wt 103.4 lb

## 2017-01-23 VITALS — BP 123/93 | HR 102

## 2017-01-23 DIAGNOSIS — C779 Secondary and unspecified malignant neoplasm of lymph node, unspecified: Secondary | ICD-10-CM

## 2017-01-23 DIAGNOSIS — C786 Secondary malignant neoplasm of retroperitoneum and peritoneum: Secondary | ICD-10-CM

## 2017-01-23 DIAGNOSIS — D500 Iron deficiency anemia secondary to blood loss (chronic): Secondary | ICD-10-CM

## 2017-01-23 DIAGNOSIS — C182 Malignant neoplasm of ascending colon: Secondary | ICD-10-CM

## 2017-01-23 DIAGNOSIS — K5900 Constipation, unspecified: Secondary | ICD-10-CM

## 2017-01-23 DIAGNOSIS — Z933 Colostomy status: Secondary | ICD-10-CM | POA: Diagnosis not present

## 2017-01-23 DIAGNOSIS — Z5112 Encounter for antineoplastic immunotherapy: Secondary | ICD-10-CM | POA: Diagnosis not present

## 2017-01-23 DIAGNOSIS — D509 Iron deficiency anemia, unspecified: Secondary | ICD-10-CM

## 2017-01-23 DIAGNOSIS — R109 Unspecified abdominal pain: Secondary | ICD-10-CM

## 2017-01-23 DIAGNOSIS — R21 Rash and other nonspecific skin eruption: Secondary | ICD-10-CM

## 2017-01-23 DIAGNOSIS — M797 Fibromyalgia: Secondary | ICD-10-CM | POA: Diagnosis not present

## 2017-01-23 DIAGNOSIS — E440 Moderate protein-calorie malnutrition: Secondary | ICD-10-CM

## 2017-01-23 DIAGNOSIS — F329 Major depressive disorder, single episode, unspecified: Secondary | ICD-10-CM

## 2017-01-23 DIAGNOSIS — K432 Incisional hernia without obstruction or gangrene: Secondary | ICD-10-CM

## 2017-01-23 LAB — COMPREHENSIVE METABOLIC PANEL
ALBUMIN: 2.5 g/dL — AB (ref 3.5–5.0)
ALK PHOS: 108 U/L (ref 40–150)
ALT: 8 U/L (ref 0–55)
ANION GAP: 7 meq/L (ref 3–11)
AST: 13 U/L (ref 5–34)
BILIRUBIN TOTAL: 0.23 mg/dL (ref 0.20–1.20)
BUN: 17.1 mg/dL (ref 7.0–26.0)
CALCIUM: 8.9 mg/dL (ref 8.4–10.4)
CO2: 32 meq/L — AB (ref 22–29)
Chloride: 97 mEq/L — ABNORMAL LOW (ref 98–109)
Creatinine: 0.6 mg/dL (ref 0.6–1.1)
Glucose: 102 mg/dl (ref 70–140)
Potassium: 4.1 mEq/L (ref 3.5–5.1)
Sodium: 136 mEq/L (ref 136–145)
TOTAL PROTEIN: 6.9 g/dL (ref 6.4–8.3)

## 2017-01-23 LAB — CBC WITH DIFFERENTIAL/PLATELET
BASO%: 0.4 % (ref 0.0–2.0)
Basophils Absolute: 0 10*3/uL (ref 0.0–0.1)
EOS%: 0.8 % (ref 0.0–7.0)
Eosinophils Absolute: 0.1 10*3/uL (ref 0.0–0.5)
HEMATOCRIT: 27.1 % — AB (ref 34.8–46.6)
HEMOGLOBIN: 8.4 g/dL — AB (ref 11.6–15.9)
LYMPH#: 2.2 10*3/uL (ref 0.9–3.3)
LYMPH%: 25.4 % (ref 14.0–49.7)
MCH: 23.1 pg — ABNORMAL LOW (ref 25.1–34.0)
MCHC: 31.1 g/dL — ABNORMAL LOW (ref 31.5–36.0)
MCV: 74.3 fL — ABNORMAL LOW (ref 79.5–101.0)
MONO#: 1.2 10*3/uL — ABNORMAL HIGH (ref 0.1–0.9)
MONO%: 13.7 % (ref 0.0–14.0)
NEUT#: 5.1 10*3/uL (ref 1.5–6.5)
NEUT%: 59.7 % (ref 38.4–76.8)
Platelets: 561 10*3/uL — ABNORMAL HIGH (ref 145–400)
RBC: 3.65 10*6/uL — ABNORMAL LOW (ref 3.70–5.45)
RDW: 16.5 % — AB (ref 11.2–14.5)
WBC: 8.5 10*3/uL (ref 3.9–10.3)

## 2017-01-23 LAB — CEA (IN HOUSE-CHCC): CEA (CHCC-IN HOUSE): 7.49 ng/mL — AB (ref 0.00–5.00)

## 2017-01-23 LAB — IRON AND TIBC
%SAT: 14 % — AB (ref 21–57)
Iron: 22 ug/dL — ABNORMAL LOW (ref 41–142)
TIBC: 159 ug/dL — AB (ref 236–444)
UIBC: 136 ug/dL (ref 120–384)

## 2017-01-23 LAB — FERRITIN: FERRITIN: 625 ng/mL — AB (ref 9–269)

## 2017-01-23 MED ORDER — TRIAMCINOLONE ACETONIDE 0.5 % EX OINT: 1.0000 "application " | TOPICAL_OINTMENT | Freq: Two times a day (BID) | CUTANEOUS | 0 refills | Status: DC

## 2017-01-23 MED ORDER — SODIUM CHLORIDE 0.9% FLUSH
10.0000 mL | INTRAVENOUS | Status: DC | PRN
  Administered 2017-01-23: 10 mL
  Filled 2017-01-23: qty 10

## 2017-01-23 MED ORDER — ALPRAZOLAM 0.25 MG PO TABS: 0.2500 mg | ORAL_TABLET | Freq: Every evening | ORAL | 0 refills | Status: AC | PRN

## 2017-01-23 MED ORDER — HEPARIN SOD (PORK) LOCK FLUSH 100 UNIT/ML IV SOLN
500.0000 [IU] | Freq: Once | INTRAVENOUS | Status: AC | PRN
  Administered 2017-01-23: 500 [IU]
  Filled 2017-01-23: qty 5

## 2017-01-23 MED ORDER — SODIUM CHLORIDE 0.9 % IV SOLN
Freq: Once | INTRAVENOUS | Status: AC
  Administered 2017-01-23: 15:00:00 via INTRAVENOUS

## 2017-01-23 MED ORDER — MORPHINE SULFATE ER 30 MG PO TBCR: 30.0000 mg | EXTENDED_RELEASE_TABLET | Freq: Two times a day (BID) | ORAL | 0 refills | Status: DC

## 2017-01-23 MED ORDER — PROCHLORPERAZINE MALEATE 10 MG PO TABS: 10.0000 mg | ORAL_TABLET | Freq: Four times a day (QID) | ORAL | 2 refills | Status: AC | PRN

## 2017-01-23 MED ORDER — SODIUM CHLORIDE 0.9 % IV SOLN
800.0000 mg | Freq: Once | INTRAVENOUS | Status: AC
  Administered 2017-01-23: 800 mg via INTRAVENOUS
  Filled 2017-01-23: qty 13.33

## 2017-01-23 MED ORDER — CLINDAMYCIN PHOSPHATE 1 % EX GEL: Freq: Two times a day (BID) | CUTANEOUS | 0 refills | Status: AC

## 2017-01-23 MED ORDER — OXYCODONE HCL 10 MG PO TABS: 10.0000 mg | ORAL_TABLET | ORAL | 0 refills | Status: DC

## 2017-01-23 MED ORDER — COBIMETINIB FUMARATE 20 MG PO TABS: 60.0000 mg | ORAL_TABLET | Freq: Every day | ORAL | 0 refills | Status: DC

## 2017-01-23 NOTE — Patient Instructions (Signed)
[facility] Discharge Instructions for Patients Receiving Chemotherapy  Today you received the following chemotherapy agents Alezolizumab.    To help prevent nausea and vomiting after your treatment, we encourage you to take your nausea medication as prescribed.    If you develop nausea and vomiting that is not controlled by your nausea medication, call the clinic.   BELOW ARE SYMPTOMS THAT SHOULD BE REPORTED IMMEDIATELY:  *FEVER GREATER THAN 100.5 F  *CHILLS WITH OR WITHOUT FEVER  NAUSEA AND VOMITING THAT IS NOT CONTROLLED WITH YOUR NAUSEA MEDICATION  *UNUSUAL SHORTNESS OF BREATH  *UNUSUAL BRUISING OR BLEEDING  TENDERNESS IN MOUTH AND THROAT WITH OR WITHOUT PRESENCE OF ULCERS  *URINARY PROBLEMS  *BOWEL PROBLEMS  UNUSUAL RASH Items with * indicate a potential emergency and should be followed up as soon as possible.  Feel free to call the clinic you have any questions or concerns. The clinic phone number is [phone].  Please show the [place] at check-in to the Emergency Department and triage nurse.

## 2017-01-23 NOTE — Patient Instructions (Signed)

## 2017-01-23 NOTE — Progress Notes (Signed)
[place]  [phone] [phone]) [phone]  Clinic follow up Note   Patient Care Team: Carol Ada, MD as PCP - General (Family Medicine) Rolm Bookbinder, MD as Consulting Physician (General Surgery) 01/23/2017   CHIEF COMPLAINTS:  Follow up metastatic colon cancer   Oncology History   Cancer of right colon [facility])   Staging form: Colon and Rectum, AJCC 7th Edition     Pathologic stage from 08/03/2015: Stage IIIB (T4a, N1a, cM0) - Signed by Truitt Merle, MD on 09/04/2015       Cancer of right colon ([place])   03/31/2015 Imaging    CT abdomen showed appendicitis, perforated, with periappendiceal abscess. And small pulmonary nodules.      04/03/2015 Procedure    CT guided placement of going Through into the right lower abdominal quadrant with aspiration of a total of 30 mL prudent fluid.       08/03/2015 Initial Diagnosis    Cancer of right colon ([place])      08/03/2015 Surgery    Right hemicolectomy      08/03/2015 Pathology Results    Right hemicolectomy showed invasive adenocarcinoma, moderately differentiated, likely arising from a cecal valve. One out of 40 lymph nodes were positive, (+) LVI, margins negative       08/03/2015 Miscellaneous    colon cance MSI-stable       09/18/2015 - 03/31/2016 Chemotherapy    Adjuvant CAPOX, she tolerated oxaliplatin poorly, which was held from cycle 2 to 4 due to poor tolerance, restarted from cycle 5 with lower dose, she also had significant hand-foot syndrome from Xeloda. She completed a total of 8 cycles.      05/20/2016 Imaging    PET scan revealed hypermetabolic peritoneal carcinomatosis, five scatted pulmonary nodules in both lungs, largest 4 mm, metastasis not excluded. Hypermetabolic mass in the deep subcutaneous ventral right lateral pelvic wall, probable metastasis.      06/02/2016 - 09/29/2016 Chemotherapy    FOLFIRI every 2 weeks, started on 06/02/2016, Avastin added from cycle 3. Held due to the  patient hospitalized for a colonic obstruction on 10/09/16.       06/26/2016 Miscellaneous    Foundation One revealed KRAS mutation (+), MSI-stable       08/17/2016 Imaging    CT CAP IMPRESSION: 1. Interval progression of peritoneal carcinomatosis with progressive scalloping of the liver and increase in size of the peritoneal masses. 2. Multiple pulmonary nodules are again noted. All these remain stable with the exception of a left upper lobe nodule which has increased to 5 mm from 3 mm previously.      10/09/2016 - 10/20/2016 Hospital Admission    The patient presented to hospital with constipation and abdominal pain. She had malignant colonic obstruction due to carcinomatosis.      10/09/2016 Imaging     CT AP W CONTRAST IMPRESSION: Prominent diffuse peritoneal carcinomatosis with low-attenuation lesions seen throughout the peritoneal cavity and causing scalloping of the margins of the liver and spleen. Some loculated fluid collections may be present, likely due to malignant ascites. Appearances are similar to previous study.  There appears to be partial colectomy with remaining colon distended and filled with contrast material and gas. Contrast material in the colon suggest no evidence of complete obstruction. Segmental areas of wall thickening and edema demonstrated in the small bowel and in the sigmoid colon, likely resulting from serosal implants of carcinomatosis. Superimposed inflammatory process not entirely excluded.      10/13/2016 Surgery  Patient s/p transverse loop colostomy per Dr. Redmond Pulling 10/13/2016.       11/03/2016 Echocardiogram    Echocardiogram 11/03/16 Left ventricle: The cavity size was normal. Wall thickness was   normal. Systolic function was normal. The estimated ejection   fraction was in the range of 55% to 60%. Mildly reduced GLPSS at   -17%. Wall motion was normal; there were no regional wall motion   abnormalities. The study is not  technically sufficient to allow   evaluation of LV diastolic function.      12/09/2016 -  Chemotherapy    Atezolitumab (immunotherapy) and Cobimetinib (MEK inhibitor) third line chemo therapy clinical trail.   Start Cobimetinib with 1 tablet a day for 3-4 days then 2 tablets daily for 3-4 days then up to 3 tablets daily to monitor tolerance for 3 weeks on and 1 week off.      12/22/2016 Imaging    CT Chest/Abd w contrast  IMPRESSION: 1. Interval progression of peritoneal carcinomatosis. 2. There is a new low-attenuation lesion within the lateral right lobe of liver worrisome for intrahepatic metastasis. 3. Small pulmonary nodules are again noted and do not appear significantly changed in the interval.       HISTORY OF PRESENTING ILLNESS:  [name] 51 y.o. female without significant PMH is here because of her recently diagnosed stage III colon cancer. She is accompanied by her boyfriend to the clinic today.  She developed fever and headache in September 2016, was found to have ruptured appendix and was admitted to hospital. CT scan revealed a peri-appendiceal abscess. She underwent abscess drainage tube basement by interventional radiology on 04/17/2015, and a course of antibiotics. She had a multiple drainage tube exchange by IR, but the abscess did not heal. She was referred to Dr. Donne [name]. Multiple CT scan in October and November showed persistent peri-appendiceal fluid collection. She was finally taken to the operation room on 08/03/2015. Intraoperatively, she was found to have a 2 cm whole present in the cecum, she underwent ileocectomy with an anastomosis in the hepatic fixture. 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  She had wound infection issue after surgery. It has been healing slowly, she is still on wet-to-dry dressing change twice daily, has home care nurse. She has moderate pain, 6-7/10, sometime postional, she is on oxycodone 2-3 times a day. Her  appetite and eating is getting better, lost about 25lb since 03/2015, no fever and chills for the past 3-4 days, just finished abx yesterday.   CURRENT THERAPY: Chemotherapy Atezolitumab (immunotherapy) every 3 weeks starting today 12/09/2016 and Cobimetinib (MEK inhibitor) 31m daily for 3 weeks then 1 week off, will start on 12/26/2016  MS contin 188m twice daily starting 12/23/16  INTERIM HISTORY:  RoLucielleeturns for follow-up and cycle 2 of chemotherapy. She presents to the clinic today with her fianc. She reports to having issues with colostomy and it swells often. She went to hospital for it. She had the colostomy bloated and her skin was raw and bleeding. She covers it with layers of dressing. She was shown by nurse how to keep wafer the same. She currently has a breach on it. The swelling reduces when they put ice on it and whenever she lays down. Her skin around it is doing better and she uses a cleanser to help. Her stool output can be watery or pasty. According to husband she had little to no output for a few days until they found something she liked  to eat. She reports to having week breathe.  Her rash has gotten better but still has white heads around her nose area.  She has slowed down on eating and she has indigestion. Her husband to trying different food and making sure she eats. She reports her pain is controlled. Her energy level has gone down.  A week ago she had an episode of sever fatigue and she slept for a while. Her husband thinks she does better when she gets up at a later time in the day and that she works too much currently. He thinks she should go down to part time. Her husband would also like her to have something to help her walk or wheel her around in.  They want to know if marriage will be ok, they will contact social work to see how that impacts them financially.     MEDICAL HISTORY:  Past Medical History:  Diagnosis Date  . Anemia   . Anxiety   . Cancer ([place])     colon cancer stage 3  . Constipation   . Depression   . Fibromyalgia   . GERD (gastroesophageal reflux disease)   . JP drain bleeding     SURGICAL HISTORY: Past Surgical History:  Procedure Laterality Date  . ABDOMINAL HYSTERECTOMY    . COLON SURGERY  08/03/2015  . COLOSTOMY REVISION N/A 08/03/2015   Procedure: COLON RESECTION RIGHT;  Surgeon: Rolm Bookbinder, MD;  Location: [facility];  Service: General;  Laterality: N/A;  . GASTRIC BYPASS    . IR GENERIC HISTORICAL  05/30/2016   IR FLUORO GUIDE PORT INSERTION RIGHT 05/30/2016 Aletta Edouard, MD [facility]  . IR GENERIC HISTORICAL  05/30/2016   IR US GUIDE VASC ACCESS RIGHT 05/30/2016 Aletta Edouard, MD [facility]  . LAPAROSCOPIC ILEOCECECTOMY Right 08/03/2015   Procedure: LAPAROSCOPIC DIAGNOSTIC RIGHT COLECTOMY;  Surgeon: Rolm Bookbinder, MD;  Location: [facility];  Service: General;  Laterality: Right;  . LAPAROSCOPY N/A 10/13/2016   Procedure: left upper quadrant LOOP COLOSTOMY;  Surgeon: Greer Pickerel, MD;  Location: [facility];  Service: General;  Laterality: N/A;    SOCIAL HISTORY: Social History   Social History  . Marital status: Significant Other    Spouse name: N/A  . Number of children: N/A  . Years of education: N/A   Occupational History  . underwriter support specialist    Social History Main Topics  . Smoking status: Never Smoker  . Smokeless tobacco: Never Used  . Alcohol use 1.2 oz/week    2 Glasses of wine per week     Comment: moderate 1-2 times a month   . Drug use: No  . Sexual activity: Not on file   Other Topics Concern  . Not on file   Social History Narrative   Single, fiance [name]   Works [employer] 11:30am to 8 pm    FAMILY HISTORY: Family History  Problem Relation Age of Onset  . Cancer Father 66       small cell lung cancer and colon cancer   . Cancer Sister 50       small cell lung cancer  . Cancer Maternal Aunt        ovarian cancer  . Cancer Paternal Uncle         brain cancer   . Cancer Other 40       ovarian cancer (maternal niece)    ALLERGIES:  is allergic to latex; cymbalta [duloxetine hcl]; and penicillins.  MEDICATIONS:  Current Outpatient Prescriptions  Medication Sig Dispense Refill  . ALPRAZolam (XANAX) 0.25 MG tablet Take 1-2 tablets (0.25-0.5 mg total) by mouth at bedtime as needed for anxiety or sleep. 60 tablet 0  . cobimetinib fumarate (COTELLIC) 20 MG tablet Take 3 tablets (60 mg total) by mouth daily. Take on days 1-21. Repeat every 28 days. Avoid sun exposure. 63 tablet 0  . cyclobenzaprine (FLEXERIL) 10 MG tablet Take 1 tablet (10 mg total) by mouth 3 (three) times daily as needed for muscle spasms. 60 tablet 0  . lidocaine-prilocaine (EMLA) cream Apply 1 application topically as needed. 30 g 2  . morphine (MS CONTIN) 30 MG 12 hr tablet Take 1 tablet (30 mg total) by mouth every 12 (twelve) hours. 60 tablet 0  . Oxycodone HCl 10 MG TABS Take 1 tablet (10 mg total) by mouth every 4 (four) hours. 120 tablet 0  . prochlorperazine (COMPAZINE) 10 MG tablet Take 1 tablet (10 mg total) by mouth every 6 (six) hours as needed for nausea or vomiting. 30 tablet 2  . traMADol (ULTRAM) 50 MG tablet Take 1-2 tablets (50-100 mg total) by mouth every 6 (six) hours as needed. 60 tablet 0  . clindamycin (CLINDAGEL) 1 % gel Apply topically 2 (two) times daily. 30 g 0  . triamcinolone ointment (KENALOG) 0.5 % Apply 1 application topically 2 (two) times daily. 30 g 0   No current facility-administered medications for this visit.    Facility-Administered Medications Ordered in Other Visits  Medication Dose Route Frequency Provider Last Rate Last Dose  . clindamycin (CLEOCIN) 900 mg in dextrose 5 % 50 mL IVPB  900 mg Intravenous 60 min Pre-Op Rolm Bookbinder, MD       And  . gentamicin (GARAMYCIN) 310 mg in dextrose 5 % 50 mL IVPB  5 mg/kg Intravenous 60 min Pre-Op Rolm Bookbinder, MD      . heparin lock flush 100 unit/mL  500 Units Intravenous  PRN Truitt Merle, MD   500 Units at 10/04/16 1644  . sodium chloride 0.9 % injection 10 mL  10 mL Intracatheter PRN Truitt Merle, MD   10 mL at 06/23/16 1139  . sodium chloride 0.9 % injection 10 mL  10 mL Intravenous PRN Truitt Merle, MD   10 mL at 10/04/16 1644  . sodium chloride 0.9 % injection 10 mL  10 mL Intravenous PRN Truitt Merle, MD   10 mL at 12/09/16 1329    REVIEW OF SYSTEMS:  Constitutional: Denies fevers, chills or abnormal night sweats. (+) slight purposeful weight gain (+) low energy and low appetite (+) increased fatigued Eyes: Denies blurriness of vision, double vision or watery eyes Ears, nose, mouth, throat, and face: Denies mucositis or sore throat Respiratory: Denies cough, dyspnea or wheezes (+) weak breathe Cardiovascular: Denies palpitation, chest discomfort or lower extremity swelling Gastrointestinal:  Denies heartburn (+) colostomy, leaking and bloated  Skin: (+) rash on bridge of nose, improved Lymphatics: Denies new lymphadenopathy or easy bruising  Neurological:Denies numbness, tingling or new weaknesses Behavioral/Psych: Mood is stable, no new changes  Musculoskeletal: (+)back and hip pain All other systems were reviewed with the patient and are negative.  PHYSICAL EXAMINATION:  ECOG PERFORMANCE STATUS: 2  Vitals:   01/23/17 1403  BP: (!) 140/108  Pulse: (!) 106  Resp: 17  Temp: 97.8 F (36.6 C)  TempSrc: Oral  SpO2: 98%  Weight: 103 lb 6.4 oz (46.9 kg)  Height: 5' (1.524 m)   GENERAL:alert, no distress and comfortable  SKIN: skin color, texture, turgor are normal, no rashes or significant lesions EYES: normal, conjunctiva are pink and non-injected, sclera clear OROPHARYNX:no exudate, no erythema and lips, buccal mucosa, and tongue normal  NECK: supple, thyroid normal size, non-tender, without nodularity LYMPH:  no palpable lymphadenopathy in the cervical, axillary or inguinal   LUNGS: clear to auscultation and percussion with normal breathing  effort HEART: regular rate & rhythm and no murmurs and no lower extremity edema ABDOMEN: abdomen soft, normal bowel sounds (+) colostomy protruded with small hernia. Lower portion is covered by gas due to leakage.  Musculoskeletal:no cyanosis of digits and no clubbing  PSYCH: alert & oriented x 3 with fluent speech NEURO: no focal motor/sensory deficits  LABORATORY DATA:  I have reviewed the data as listed CBC Latest Ref Rng & Units 01/23/2017 01/08/2017 12/23/2016  WBC 3.9 - 10.3 10e3/uL 8.5 9.3 4.8  Hemoglobin 11.6 - 15.9 g/dL 8.4(L) 8.3(L) 9.1(L)  Hematocrit 34.8 - 46.6 % 27.1(L) 28.0(L) 28.2(L)  Platelets 145 - 400 10e3/uL 561(H) 279 371   CMP Latest Ref Rng & Units 01/23/2017 01/08/2017 12/23/2016  Glucose 70 - 140 mg/dl 102 91 97  BUN 7.0 - 26.0 mg/dL 17.1 9 5.6(L)  Creatinine 0.6 - 1.1 mg/dL 0.6 0.47 0.6  Sodium 136 - 145 mEq/L 136 136 131(L)  Potassium 3.5 - 5.1 mEq/L 4.1 4.0 3.9  Chloride 101 - 111 mmol/L - 103 -  CO2 22 - 29 mEq/L 32(H) 24 25  Calcium 8.4 - 10.4 mg/dL 8.9 8.7(L) 9.4  Total Protein 6.4 - 8.3 g/dL 6.9 - 7.2  Total Bilirubin 0.20 - 1.20 mg/dL 0.23 - 0.22  Alkaline Phos 40 - 150 U/L 108 - 86  AST 5 - 34 U/L 13 - 10  ALT 0 - 55 U/L 8 - <6   Results for CHERITH, TEWELL (MRN [id number]) as of 01/24/2017 08:01  Ref. Range 09/01/2016 11:04 11/04/2016 09:52 01/23/2017 13:33  Iron Latest Ref Range: 41 - 142 ug/dL 36 (L) 14 (L) 22 (L)  UIBC Latest Ref Range: 120 - 384 ug/dL 256 160 136  TIBC Latest Ref Range: 236 - 444 ug/dL 293 174 (L) 159 (L)  %SAT Latest Ref Range: 21 - 57 % 12 (L) 8 (L) 14 (L)  Ferritin Latest Ref Range: 9 - 269 ng/ml 387 (H) 401 (H) 625 (H)   CEA 02/04/2016: 2.5 03/18/2016: 6.84 06/02/2016: 21.73 08/18/2016: 14.27 09/15/2016: 9.01 12/09/2016: 5.01 01/23/17: PENDING    PATHOLOGY REPORT  05/30/2016 Diagnosis Peritoneum, biopsy, left lower quadrant mesentery ADENOCARCINOMA WITH ABUNDANT EXTRACELLULAR MUCIN CONSISTENT WITH COLONIC PRIMARY   FOUNDATION  ONE [id number] 05/30/2016   Diagnosis 08/03/2015 Colon, segmental resection, Right - INVASIVE ADENOCARCINOMA WITH ABUNDANT EXTRACELLULAR MUCIN, MODERATELY DIFFERENTIATED, LIKELY ARISING FROM ILEOCECAL VALVE. - ADENOCARCINOMA EXTENDS AT LEAST INTO THE PERICOLONIC SOFT TISSUE AND IS ASSOCIATED WITH TRANSMURAL DEFECT. - THE PROXIMAL AND DISTAL RESECTION MARGINS ARE NEGATIVE FOR ADENOCARCINOMA. - ACELLULAR MUCIN IS GROSSLY PRESENT AT THE SEROSAL SURFACE. - LYMPHOVASCULAR INVASION IS IDENTIFIED. - METASTATIC CARCINOMA IN 1 OF 40 LYMPH NODES (1/40). - SEE ONCOLOGY TABLE BELOW. Microscopic Comment COLON AND RECTUM (INCLUDING TRANS-ANAL RESECTION): Specimen: Right colon and terminal ileum. Procedure: Resection. Tumor site: Likely ileocecal valve. Specimen integrity: Transmural defect(s). Macroscopic intactness of mesorectum: N/A Macroscopic tumor perforation: Present. Invasive tumor: Maximum size: At least 2.5 cm. Histologic type(s): Adenocarcinoma with abundant extracellular mucin. Histologic grade and differentiation: G2: moderately differentiated. Type of polyp in which invasive carcinoma arose: Tubular adenoma. Microscopic extension of invasive tumor: Adenocarcinoma extends  into pericolonic soft tissue and acellular mucin is grossly present at the serosal surface. Lymph-Vascular invasion: Present. Peri-neural invasion: Not identified. Tumor deposit(s) (discontinuous extramural extension): Not identified. Resection margins: Proximal margin: Negative for carcinoma. Distal margin: Negative for carcinoma. Circumferential (radial) (posterior ascending, posterior descending; lateral and posterior mid-rectum; and entire lower 1/3 rectum): Acellular mucin is present at the circumferential tissue edge. Treatment effect (neo-adjuvant therapy): N/A Additional polyp(s): Not identified. Non-neoplastic findings: No significant findings. Lymph nodes: number examined 40; number positive:  1 Pathologic Staging: at least pT4a, pN1a. Ancillary studies: MMR by IHC and MSI by PCR will be performed and the results reported separately. Additional studies can be performed upon clinician request.   ADDITIONAL INFORMATION: Mismatch Repair (MMR) Protein Immunohistochemistry (IHC) IHC Expression Result: MLH1: EQUIVOCAL MSH2: EQUIVOCAL MSH6: EQUIVOCAL PMS2: EQUIVOCAL * Internal control demonstrates intact nuclear expression Interpretation: EQUIVOCAL The tumor shows partial staining with all anibodies and hence the results are considered equivocal. Correlated with molecular based MSI testing is strongly recommended.    RADIOGRAPHIC STUDIES: I have personally reviewed the radiological images as listed and agreed with the findings in the report.  DG ABD 01/08/17 IMPRESSION: 1. Unremarkable bowel gas pattern; no free intra-abdominal air seen. Small to moderate amount of stool noted in the colon. 2. No acute cardiopulmonary process seen.   CT Chest/Abd w contrast 12/22/2016 IMPRESSION: 1. Interval progression of peritoneal carcinomatosis. 2. There is a new low-attenuation lesion within the lateral right lobe of liver worrisome for intrahepatic metastasis. 3. Small pulmonary nodules are again noted and do not appear significantly changed in the interval.   ASSESSMENT & PLAN:  51 y.o. Caucasian female, with past medical history of fibromyalgia, depression, presented with ruptured cecum and pericolonic abscess, required multiple drainage, antibiotics, and eventually right colectomy.    1. Right colon cancer, cecum, moderately differentiated adenocarcinoma, pT4N1aM0, stage IIIA, with perforation, MSI-stable, KRAS mutation (+), peritoneum recurrence - I previously reviewed her scan findings, and surgical pathology results in great details with patient and her Husband.  - she had locally advanced stage III  Disease, with particular high risk features of T4 tumor, proliferation for  3-4 months,  And positive lymph nodes, she is at very high risk for cancer recurrence, especially peritoneal carcinomatosis. -She has completed adjuvant chemotherapy CAPOX, oxaliplatin dose was significantly reduced due to her poor tolerance. -I previously discussed her surveillance CT scan from 04/29/2016, which unfortunately showed probable peritoneal carcinomatosis. No other significant metastasis on the CT scan. We discussed that the metastasis is not definitive based on CT, and needs to be confirmed by biopsy  -I previously reviewed her PET scan from 05/20/16, which showed hypermetabolic peritoneal carcinomatosis, in a few small lung nodules which are indeterminate. -She started chemotherapy FOLFIRI and Avastin on 06/02/16, tolerated well, and her abdominal pain has improved. Unfortunately she had significant disease progression in peritoneum, which caused sigmoid colon obstruction, status post transverse colon diverting colostomy. Chemo held.  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 -she has started third line therapy with Atezolitumab (immunotherapy) and Cobimetinib (MEK inhibitor). This was studied in [facility] mCRC clinical trial which showed 17% PR and 22% SD. The result from the large phase 3 clinical trial of this combination was recently released and negative. Her insurance has denied the medications, we finally received free drug replacement for her.  -she has started cobimetinib and received first cycle, she tolerated well overall except rashes on  the face and fatigue. Will continue, and start second cycle this week with full dose 58m daily for 3 weeks on and one week off  -lab reviewed, will proceed second cycle Atezo  -repeat CT scan in 3 weeks  -Labs reviewed and she is slightly anemic but adequate to continue treatment today   2. Abdominal pain -Likely related to her cancer  recurrence -previously improved since she started chemotherapy -Continue oxycodone 10 mg pills as needed, about 4-5 a day. I will think about putting her on long acting pain medication if this continues. -She has been taking oxycodone 50-60 mg daily, I recommend her to add long-acting pain medication. Prescribe MS contin 14m twice daily. On 6/1 for pain -Her colostomy is protruding because her abdominal wall is weak. I highly suggest she does not lift anything as that will push her colostomy further.   3. Anemia, iron deficient anemia  - likely secondary to surgery,  Chronic infection and GI bleeding from the tumor -Previous Iron study showed low serum iron, transferrin saturation and ferritin 12, which is consistent with iron deficiency -Due to her significant worsening anemia, hemoglobin 8.7 on 06/23/16, I recommendeded IV Feraheme weekly twice . Her anemia has approved some -We'll consider blood transfusion if hemoglobin less than 8 or symptomatic anemia with hemoglobin 8-9 -She will continue oral iron -Her repeated on study on 11/04/2016 showed elevated ferritin, very low serum iron and TIBC, consistent with anemia of chronic disease, and a component of iron deficiency. I'll set up IV Feraheme once to see if her anemia improves. -Due to her slightly anemic levels we previously gave her IV Iron in May 2018 and she responded moderately -I'll give one more dose of ferriheme -Due to her significant fatigue, I'll arrange blood transfusion this week   4. Fibromyalgia and depression - she will continue medication, and follow-up with her primary care physician - I refilled Xanax on 6/1  5. Constipation -She will continue stool softener and laxative senna-s every day. -She had a colostomy placed on 10/13/16 and the stool is formed. Denies issues with colostomy.  6. Goal of care discussion  -We again discussed the incurable nature of her cancer, and the overall poor prognosis, especially if she  does not have good response to chemotherapy or progress on chemo -The patient understands the goal of care is palliative. -I previously recommend DNR/DNI, she will think about it   7. Weight loss and moderate malnutrition secondary to cancer.  -She has lost quite a bit of weight in the past few months, but has increased in appetite and gained 2 pounds since last visit -Continue follow-up with our dietitian  8. Social issues -Due to her significant symptoms, I recommend her to cut her work hours. I encouraged her to apply for disability and Medicaid -Her fianc wants to marry her, we reviewed the financial impact from marriage, which may disqualify her for disability and Medicaid -I referred her to social worker  9. 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 WiRedmond PullingAnd will follow up with colostomy clinic nurse -I instructed her to avoid heavy lifting  Plan -Order kenalog 0.5% and clindamycin gel -refill oxycodone, zanax, MS Contin and compazine -I refilled cobimatinib 6060mshe will start this week for 3 weeks on, 1 week off.  -proceed Atezo today  -Lab, flush, f/u  and treatment in 2 weeks  -Education officer, museum for -Restaging CT chest, abdomen and pelvis in 3 weeks -I'll see her back in 2 and 4 weeks   All questions were answered. The patient knows to call the clinic with any problems, questions or concerns.  I spent 30 minutes counseling the patient face to face. The total time spent in the appointment was 40 minutes and more than 50% was on counseling.      Truitt Merle, MD 01/23/2017   This document serves as a record of services personally performed by Truitt Merle, MD. It was created on her behalf by Joslyn Devon, a trained medical scribe. The creation of this record is based on the scribe's personal observations and the provider's statements to  them. This document has been checked and approved by the attending provider.

## 2017-01-23 NOTE — Telephone Encounter (Signed)
Scheduled appt per 7/2 los - patient to get new schedule in the treatment area .

## 2017-01-24 LAB — CK: CK TOTAL: 22 U/L — AB (ref 24–173)

## 2017-01-26 ENCOUNTER — Other Ambulatory Visit: Payer: 59

## 2017-01-26 ENCOUNTER — Ambulatory Visit: Payer: 59

## 2017-01-26 ENCOUNTER — Other Ambulatory Visit: Payer: Self-pay | Admitting: *Deleted

## 2017-01-26 ENCOUNTER — Ambulatory Visit (HOSPITAL_COMMUNITY)
Admission: RE | Admit: 2017-01-26 | Discharge: 2017-01-26 | Disposition: A | Discharge status: Home / Self Care | Payer: 59 | Source: Ambulatory Visit | Attending: Hematology | Admitting: Hematology

## 2017-01-26 ENCOUNTER — Ambulatory Visit (HOSPITAL_BASED_OUTPATIENT_CLINIC_OR_DEPARTMENT_OTHER): Payer: 59

## 2017-01-26 ENCOUNTER — Encounter: Payer: Self-pay | Admitting: *Deleted

## 2017-01-26 DIAGNOSIS — D630 Anemia in neoplastic disease: Secondary | ICD-10-CM | POA: Diagnosis not present

## 2017-01-26 DIAGNOSIS — C779 Secondary and unspecified malignant neoplasm of lymph node, unspecified: Secondary | ICD-10-CM | POA: Diagnosis not present

## 2017-01-26 DIAGNOSIS — Z452 Encounter for adjustment and management of vascular access device: Secondary | ICD-10-CM | POA: Diagnosis not present

## 2017-01-26 DIAGNOSIS — C182 Malignant neoplasm of ascending colon: Secondary | ICD-10-CM

## 2017-01-26 DIAGNOSIS — Z95828 Presence of other vascular implants and grafts: Secondary | ICD-10-CM

## 2017-01-26 DIAGNOSIS — C189 Malignant neoplasm of colon, unspecified: Secondary | ICD-10-CM | POA: Insufficient documentation

## 2017-01-26 DIAGNOSIS — D500 Iron deficiency anemia secondary to blood loss (chronic): Secondary | ICD-10-CM

## 2017-01-26 DIAGNOSIS — C786 Secondary malignant neoplasm of retroperitoneum and peritoneum: Secondary | ICD-10-CM

## 2017-01-26 LAB — PREPARE RBC (CROSSMATCH)

## 2017-01-26 MED ORDER — SODIUM CHLORIDE 0.9 % IJ SOLN
10.0000 mL | INTRAMUSCULAR | Status: DC | PRN
  Administered 2017-01-26: 10 mL via INTRAVENOUS
  Filled 2017-01-26: qty 10

## 2017-01-26 MED ORDER — HEPARIN SOD (PORK) LOCK FLUSH 100 UNIT/ML IV SOLN
500.0000 [IU] | INTRAVENOUS | Status: DC | PRN
  Administered 2017-01-26: 500 [IU] via INTRAVENOUS
  Filled 2017-01-26: qty 5

## 2017-01-26 NOTE — Progress Notes (Signed)
[facility] Work  Clinical Social Work was referred by Futures trader for assessment of psychosocial needs due to resource/financial questions.  Clinical Social Worker attempted to contact patient at home to offer support and assess for needs.  CSW left brief message, encouraging pt to return call to discuss needs further.     Loren Racer, LCSW, OSW-C Clinical Social Worker [place]  [place] Phone: [phone] Fax: [phone]

## 2017-01-26 NOTE — Patient Instructions (Signed)

## 2017-01-27 ENCOUNTER — Inpatient Hospital Stay (HOSPITAL_COMMUNITY): Admission: RE | Admit: 2017-01-27 | Payer: 59 | Source: Ambulatory Visit

## 2017-01-28 ENCOUNTER — Ambulatory Visit (HOSPITAL_BASED_OUTPATIENT_CLINIC_OR_DEPARTMENT_OTHER): Payer: Self-pay

## 2017-01-28 DIAGNOSIS — D630 Anemia in neoplastic disease: Secondary | ICD-10-CM

## 2017-01-28 DIAGNOSIS — C189 Malignant neoplasm of colon, unspecified: Secondary | ICD-10-CM | POA: Diagnosis not present

## 2017-01-28 MED ORDER — SODIUM CHLORIDE 0.9% FLUSH
10.0000 mL | INTRAVENOUS | Status: AC | PRN
  Administered 2017-01-28: 10 mL
  Filled 2017-01-28: qty 10

## 2017-01-28 MED ORDER — HEPARIN SOD (PORK) LOCK FLUSH 100 UNIT/ML IV SOLN
500.0000 [IU] | Freq: Every day | INTRAVENOUS | Status: AC | PRN
  Administered 2017-01-28: 500 [IU]
  Filled 2017-01-28: qty 5

## 2017-01-28 MED ORDER — ACETAMINOPHEN 325 MG PO TABS
650.0000 mg | ORAL_TABLET | Freq: Once | ORAL | Status: AC
  Administered 2017-01-28: 650 mg via ORAL

## 2017-01-28 MED ORDER — DIPHENHYDRAMINE HCL 25 MG PO CAPS
ORAL_CAPSULE | ORAL | Status: AC
  Filled 2017-01-28: qty 1

## 2017-01-28 MED ORDER — ACETAMINOPHEN 325 MG PO TABS
ORAL_TABLET | ORAL | Status: AC
  Filled 2017-01-28: qty 2

## 2017-01-28 MED ORDER — SODIUM CHLORIDE 0.9 % IV SOLN
250.0000 mL | Freq: Once | INTRAVENOUS | Status: AC
  Administered 2017-01-28: 250 mL via INTRAVENOUS

## 2017-01-28 MED ORDER — DIPHENHYDRAMINE HCL 25 MG PO CAPS
25.0000 mg | ORAL_CAPSULE | Freq: Once | ORAL | Status: AC
  Administered 2017-01-28: 25 mg via ORAL

## 2017-01-28 NOTE — Patient Instructions (Signed)

## 2017-01-30 LAB — TYPE AND SCREEN
ABO/RH(D): A POS
ANTIBODY SCREEN: NEGATIVE
UNIT DIVISION: 0
Unit division: 0

## 2017-01-30 LAB — BPAM RBC
BLOOD PRODUCT EXPIRATION DATE: 201807172359
Blood Product Expiration Date: 201807172359
ISSUE DATE / TIME: 201807070842
ISSUE DATE / TIME: 201807070842
Unit Type and Rh: 6200
Unit Type and Rh: 6200

## 2017-01-31 ENCOUNTER — Emergency Department (HOSPITAL_COMMUNITY): Payer: 59

## 2017-01-31 ENCOUNTER — Encounter (HOSPITAL_COMMUNITY): Payer: Self-pay

## 2017-01-31 ENCOUNTER — Inpatient Hospital Stay (HOSPITAL_COMMUNITY)
Admission: EM | Admit: 2017-01-31 | Discharge: 2017-02-02 | DRG: 175 | Disposition: A | Discharge status: Home / Self Care | Payer: 59 | Attending: Internal Medicine | Admitting: Internal Medicine

## 2017-01-31 ENCOUNTER — Other Ambulatory Visit: Payer: Self-pay

## 2017-01-31 ENCOUNTER — Telehealth: Payer: Self-pay | Admitting: *Deleted

## 2017-01-31 DIAGNOSIS — C182 Malignant neoplasm of ascending colon: Secondary | ICD-10-CM | POA: Diagnosis present

## 2017-01-31 DIAGNOSIS — Z808 Family history of malignant neoplasm of other organs or systems: Secondary | ICD-10-CM

## 2017-01-31 DIAGNOSIS — C786 Secondary malignant neoplasm of retroperitoneum and peritoneum: Secondary | ICD-10-CM | POA: Diagnosis present

## 2017-01-31 DIAGNOSIS — E871 Hypo-osmolality and hyponatremia: Secondary | ICD-10-CM | POA: Diagnosis not present

## 2017-01-31 DIAGNOSIS — Z933 Colostomy status: Secondary | ICD-10-CM

## 2017-01-31 DIAGNOSIS — Z8041 Family history of malignant neoplasm of ovary: Secondary | ICD-10-CM

## 2017-01-31 DIAGNOSIS — Z79899 Other long term (current) drug therapy: Secondary | ICD-10-CM

## 2017-01-31 DIAGNOSIS — Z800 Family history of malignant neoplasm of digestive organs: Secondary | ICD-10-CM

## 2017-01-31 DIAGNOSIS — Z90710 Acquired absence of both cervix and uterus: Secondary | ICD-10-CM

## 2017-01-31 DIAGNOSIS — Z801 Family history of malignant neoplasm of trachea, bronchus and lung: Secondary | ICD-10-CM

## 2017-01-31 DIAGNOSIS — Z888 Allergy status to other drugs, medicaments and biological substances status: Secondary | ICD-10-CM

## 2017-01-31 DIAGNOSIS — Z6820 Body mass index (BMI) 20.0-20.9, adult: Secondary | ICD-10-CM

## 2017-01-31 DIAGNOSIS — Z91040 Latex allergy status: Secondary | ICD-10-CM

## 2017-01-31 DIAGNOSIS — F419 Anxiety disorder, unspecified: Secondary | ICD-10-CM | POA: Diagnosis present

## 2017-01-31 DIAGNOSIS — Z79891 Long term (current) use of opiate analgesic: Secondary | ICD-10-CM

## 2017-01-31 DIAGNOSIS — E43 Unspecified severe protein-calorie malnutrition: Secondary | ICD-10-CM | POA: Diagnosis present

## 2017-01-31 DIAGNOSIS — G8929 Other chronic pain: Secondary | ICD-10-CM | POA: Diagnosis present

## 2017-01-31 DIAGNOSIS — K5669 Other partial intestinal obstruction: Secondary | ICD-10-CM | POA: Diagnosis present

## 2017-01-31 DIAGNOSIS — Z880 Allergy status to penicillin: Secondary | ICD-10-CM

## 2017-01-31 DIAGNOSIS — M797 Fibromyalgia: Secondary | ICD-10-CM | POA: Diagnosis present

## 2017-01-31 DIAGNOSIS — I2699 Other pulmonary embolism without acute cor pulmonale: Secondary | ICD-10-CM | POA: Diagnosis not present

## 2017-01-31 DIAGNOSIS — Z9049 Acquired absence of other specified parts of digestive tract: Secondary | ICD-10-CM

## 2017-01-31 LAB — BASIC METABOLIC PANEL
ANION GAP: 8 (ref 5–15)
BUN: 16 mg/dL (ref 6–20)
CALCIUM: 7.9 mg/dL — AB (ref 8.9–10.3)
CO2: 22 mmol/L (ref 22–32)
Chloride: 96 mmol/L — ABNORMAL LOW (ref 101–111)
Creatinine, Ser: 0.44 mg/dL (ref 0.44–1.00)
GLUCOSE: 91 mg/dL (ref 65–99)
POTASSIUM: 4 mmol/L (ref 3.5–5.1)
SODIUM: 126 mmol/L — AB (ref 135–145)

## 2017-01-31 LAB — CBC WITH DIFFERENTIAL/PLATELET
BAND NEUTROPHILS: 15 %
BASOS ABS: 0 10*3/uL (ref 0.0–0.1)
BLASTS: 0 %
Basophils Relative: 0 %
EOS ABS: 0.1 10*3/uL (ref 0.0–0.7)
Eosinophils Relative: 1 %
HEMATOCRIT: 33 % — AB (ref 36.0–46.0)
HEMOGLOBIN: 10.6 g/dL — AB (ref 12.0–15.0)
LYMPHS PCT: 19 %
Lymphs Abs: 2.4 10*3/uL (ref 0.7–4.0)
MCH: 25.1 pg — ABNORMAL LOW (ref 26.0–34.0)
MCHC: 32.1 g/dL (ref 30.0–36.0)
MCV: 78.2 fL (ref 78.0–100.0)
MONOS PCT: 8 %
Metamyelocytes Relative: 0 %
Monocytes Absolute: 1 10*3/uL (ref 0.1–1.0)
Myelocytes: 0 %
NEUTROS ABS: 9.3 10*3/uL — AB (ref 1.7–7.7)
NEUTROS PCT: 57 %
OTHER: 0 %
PROMYELOCYTES ABS: 0 %
Platelets: 327 10*3/uL (ref 150–400)
RBC: 4.22 MIL/uL (ref 3.87–5.11)
RDW: 17.8 % — ABNORMAL HIGH (ref 11.5–15.5)
WBC: 12.8 10*3/uL — AB (ref 4.0–10.5)
nRBC: 0 /100 WBC

## 2017-01-31 LAB — POCT I-STAT TROPONIN I
Troponin i, poc: 0 ng/mL (ref 0.00–0.08)
Troponin i, poc: 0 ng/mL (ref 0.00–0.08)

## 2017-01-31 LAB — D-DIMER, QUANTITATIVE (NOT AT ARMC): D DIMER QUANT: 1.56 ug{FEU}/mL — AB (ref 0.00–0.50)

## 2017-01-31 MED ORDER — IOPAMIDOL (ISOVUE-370) INJECTION 76%
INTRAVENOUS | Status: AC
  Filled 2017-01-31: qty 100

## 2017-01-31 MED ORDER — IOPAMIDOL (ISOVUE-370) INJECTION 76%
100.0000 mL | Freq: Once | INTRAVENOUS | Status: AC | PRN
  Administered 2017-02-01: 55 mL via INTRAVENOUS

## 2017-01-31 MED ORDER — SODIUM CHLORIDE 0.9 % IV BOLUS (SEPSIS): 500.0000 mL | Freq: Once | INTRAVENOUS | Status: DC

## 2017-01-31 NOTE — ED Triage Notes (Signed)
Patient c/o intermittent chest pressure x 3 days, today was the worst. Patient is currently receiving oral chemo and last took the pill 2 weeks ago. patient also c/o nausea. Patient states she last took compazine this AM.

## 2017-01-31 NOTE — ED Notes (Signed)
Patient transported to CT 

## 2017-01-31 NOTE — ED Provider Notes (Signed)
[facility] DEPT Provider Note   CSN: [id number] Arrival date & time: 01/31/17  1757     History   Chief Complaint Chief Complaint  Patient presents with  . Chest Pain    HPI [name] is a 51 y.o. female with  current diagnosis of stage III colon cancer who is currently receiving chemotherapy (last dose 2 weeks ago) presents with 3 days of intermittent diffuse chest pressure became constant today. Patient reports that midsternal chest pressure was initially intermittent. She denies any inciting triggers but did note that it is worse with exertion. Patient reports that approximately 3 PM this afternoon symptoms became more constant prompting ED visit. Patient reports that she has also had some intermittent shortness of breath that worsens when she exerts herself. Patient notes she gets tired very easily but does not know if that's related to her anemia or current SOB symptoms. Patient had a recent blood transfusion on 01/28/17 after her hemoglobin and hematocrit levels were found to be low. Patient was told that she might have some reaction from the transfusion which she thought symptoms were initially due to. Patient denies any diaphoresis with the episodes of chest pressure. She does report nausea but states that this is constantly given current diagnosis of cancer and recent chemotherapy. Patient denies any recent fever, abdominal pain, vomiting, leg swelling. Patient denies any recent surgery, recent immobilization, history of blood clots in her legs or lungs.  The history is provided by the patient.   Oncology: [facility] Hummingbird   Past Medical History:  Diagnosis Date  . Anemia   . Anxiety   . Cancer ([place])    colon cancer stage 3  . Constipation   . Depression   . Fibromyalgia   . GERD (gastroesophageal reflux disease)   . JP drain bleeding     Patient Active Problem List   Diagnosis Date Noted  . PE (pulmonary thromboembolism) ([place]) 02/01/2017  . Anxiety 02/01/2017  . Goals of  care, counseling/discussion 11/04/2016  . Abdominal pain 10/10/2016  . UTI (urinary tract infection) 10/10/2016  . Hyperglycemia 10/10/2016  . Uncontrolled pain 10/10/2016  . Peritoneal carcinomatosis ([place]) 10/10/2016  . Malignant ascites 10/10/2016  . Malignant neoplasm of colon ([place])   . SBO (small bowel obstruction) ([place])   . Port catheter in place 06/23/2016  . Hand foot syndrome 01/08/2016  . Open abdominal wall wound 10/12/2015  . Hyperphosphatemia 09/22/2015  . Hypoalbuminemia due to protein-calorie malnutrition ([place]) 09/22/2015  . Insomnia 09/22/2015  . Dehydration 09/21/2015  . Hyponatremia 09/21/2015  . Hypotension 09/21/2015  . Transaminitis 09/21/2015  . Anemia, iron deficiency 09/19/2015  . Cancer of right colon ([place]) 09/04/2015  . S/P colectomy 08/03/2015  . Perforated appendicitis 04/02/2015  . Appendiceal abscess     Past Surgical History:  Procedure Laterality Date  . ABDOMINAL HYSTERECTOMY    . COLON SURGERY  08/03/2015  . COLOSTOMY REVISION N/A 08/03/2015   Procedure: COLON RESECTION RIGHT;  Surgeon: Rolm Bookbinder, MD;  Location: [facility];  Service: General;  Laterality: N/A;  . GASTRIC BYPASS    . IR GENERIC HISTORICAL  05/30/2016   IR FLUORO GUIDE PORT INSERTION RIGHT 05/30/2016 Aletta Edouard, MD [facility]  . IR GENERIC HISTORICAL  05/30/2016   IR US GUIDE VASC ACCESS RIGHT 05/30/2016 Aletta Edouard, MD [facility]  . LAPAROSCOPIC ILEOCECECTOMY Right 08/03/2015   Procedure: LAPAROSCOPIC DIAGNOSTIC RIGHT COLECTOMY;  Surgeon: Rolm Bookbinder, MD;  Location: [facility];  Service: General;  Laterality: Right;  . LAPAROSCOPY N/A  10/13/2016   Procedure: left upper quadrant LOOP COLOSTOMY;  Surgeon: Greer Pickerel, MD;  Location: [facility];  Service: General;  Laterality: N/A;    OB History    No data available       Home Medications    Prior to Admission medications   Medication Sig Start Date End Date Taking? Authorizing Provider  ALPRAZolam (XANAX) 0.25 MG  tablet Take 1-2 tablets (0.25-0.5 mg total) by mouth at bedtime as needed for anxiety or sleep. 01/23/17  Yes Truitt Merle, MD  cobimetinib fumarate (COTELLIC) 20 MG tablet Take 3 tablets (60 mg total) by mouth daily. Take on days 1-21. Repeat every 28 days. Avoid sun exposure. 01/23/17  Yes Truitt Merle, MD  cyclobenzaprine (FLEXERIL) 10 MG tablet Take 1 tablet (10 mg total) by mouth 3 (three) times daily as needed for muscle spasms. 03/18/16  Yes Truitt Merle, MD  morphine (MS CONTIN) 30 MG 12 hr tablet Take 1 tablet (30 mg total) by mouth every 12 (twelve) hours. Patient taking differently: Take 30 mg by mouth at bedtime.  01/23/17  Yes Truitt Merle, MD  Oxycodone HCl 10 MG TABS Take 1 tablet (10 mg total) by mouth every 4 (four) hours. 01/23/17  Yes Truitt Merle, MD  prochlorperazine (COMPAZINE) 10 MG tablet Take 1 tablet (10 mg total) by mouth every 6 (six) hours as needed for nausea or vomiting. 01/23/17  Yes Truitt Merle, MD  traMADol (ULTRAM) 50 MG tablet Take 1-2 tablets (50-100 mg total) by mouth every 6 (six) hours as needed. Patient taking differently: Take 50-100 mg by mouth every 6 (six) hours as needed for severe pain.  04/20/16  Yes Truitt Merle, MD  clindamycin (CLINDAGEL) 1 % gel Apply topically 2 (two) times daily. 01/23/17   Truitt Merle, MD  lidocaine-prilocaine (EMLA) cream Apply 1 application topically as needed. 05/23/16   Truitt Merle, MD  triamcinolone ointment (KENALOG) 0.5 % Apply 1 application topically 2 (two) times daily. 01/23/17   Truitt Merle, MD    Family History Family History  Problem Relation Age of Onset  . Cancer Father 40       small cell lung cancer and colon cancer   . Cancer Sister 50       small cell lung cancer  . Cancer Maternal Aunt        ovarian cancer  . Cancer Paternal Uncle        brain cancer   . Cancer Other 40       ovarian cancer (maternal niece)    Social History Social History  Substance Use Topics  . Smoking status: Never Smoker  . Smokeless tobacco: Never Used  .  Alcohol use 1.2 oz/week    2 Glasses of wine per week     Comment: moderate 1-2 times a month      Allergies   Latex; Cymbalta [duloxetine hcl]; and Penicillins   Review of Systems Review of Systems  Constitutional: Negative for chills and fever.  HENT: Negative for congestion.   Eyes: Negative for visual disturbance.  Respiratory: Positive for shortness of breath. Negative for cough.   Cardiovascular: Positive for chest pain.  Gastrointestinal: Positive for nausea. Negative for abdominal pain, diarrhea and vomiting.  Genitourinary: Negative for dysuria and hematuria.  All other systems reviewed and are negative.    Physical Exam Updated Vital Signs BP (!) 137/97 (BP Location: Left Arm)   Pulse 98   Temp 98.5 F (36.9 C) (Oral)   Resp 20  Ht 5' (1.524 m)   Wt 49 kg (108 lb)   SpO2 98%   BMI 21.09 kg/m   Physical Exam  Constitutional: She is oriented to person, place, and time. She appears well-developed and well-nourished.  Frail-appearing  HENT:  Head: Normocephalic and atraumatic.  Mouth/Throat: Oropharynx is clear and moist and mucous membranes are normal.  Eyes: Conjunctivae, EOM and lids are normal. Pupils are equal, round, and reactive to light.  Neck: Full passive range of motion without pain.  Cardiovascular: Regular rhythm, normal heart sounds and normal pulses.  Tachycardia present.  Exam reveals no gallop and no friction rub.   No murmur heard. Pulmonary/Chest: Effort normal and breath sounds normal.  Port in place on left anterior chest wall with no surrounding warmth, erythema. No evidence of respiratory distress. Able to speak in full sentences without difficulty.  Abdominal: Soft. Normal appearance. There is no tenderness. There is no rigidity and no guarding.  Ostomy noted to the left lateral aspect of the abdomen with well-appearing stoma. No surrounding warmth, tenderness or erythema.  Musculoskeletal: Normal range of motion.  No bilateral lower  extremity edema  Neurological: She is alert and oriented to person, place, and time.  Skin: Skin is warm and dry. Capillary refill takes less than 2 seconds.  Psychiatric: She has a normal mood and affect. Her speech is normal.  Nursing note and vitals reviewed.    ED Treatments / Results  Labs (all labs ordered are listed, but only abnormal results are displayed) Labs Reviewed  BASIC METABOLIC PANEL - Abnormal; Notable for the following:       Result Value   Sodium 126 (*)    Chloride 96 (*)    Calcium 7.9 (*)    All other components within normal limits  CBC WITH DIFFERENTIAL/PLATELET - Abnormal; Notable for the following:    WBC 12.8 (*)    Hemoglobin 10.6 (*)    HCT 33.0 (*)    MCH 25.1 (*)    RDW 17.8 (*)    Neutro Abs 9.3 (*)    All other components within normal limits  D-DIMER, QUANTITATIVE (NOT AT [facility]) - Abnormal; Notable for the following:    D-Dimer, Quant 1.56 (*)    All other components within normal limits  APTT  PROTIME-INR  I-STAT TROPOININ, ED  POCT I-STAT TROPONIN I  I-STAT TROPOININ, ED  POCT I-STAT TROPONIN I    EKG  EKG Interpretation  Date/Time:  Tuesday January 31 2017 18:15:44 EDT Ventricular Rate:  111 PR Interval:    QRS Duration: 96 QT Interval:  340 QTC Calculation: 462 R Axis:   94 Text Interpretation:  Sinus tachycardia Low voltage with right axis deviation since last tracing no significant change from same day Confirmed by Malvin Johns [phone]) on 01/31/2017 6:56:41 PM       Radiology Dg Chest 2 View  Result Date: 01/31/2017 CLINICAL DATA:  51 y/o  F; chest pain. EXAM: CHEST  2 VIEW COMPARISON:  01/08/2017 chest radiograph.  12/22/2016 chest CT there FINDINGS: Stable normal cardiac silhouette. Right port catheter tip projects over lower SVC. No consolidation, effusion, or pneumothorax. Pulmonary nodules on CT are not visualized radiographically. No acute osseous abnormality is evident. IMPRESSION: No acute pulmonary process  identified. Electronically Signed   By: Kristine Garbe M.D.   On: 01/31/2017 18:33   Ct Angio Chest Pe W And/or Wo Contrast  Result Date: 02/01/2017 CLINICAL DATA:  Chest pressure for 3 days EXAM: CT ANGIOGRAPHY CHEST WITH  CONTRAST TECHNIQUE: Multidetector CT imaging of the chest was performed using the standard protocol during bolus administration of intravenous contrast. Multiplanar CT image reconstructions and MIPs were obtained to evaluate the vascular anatomy. CONTRAST:  55 mL Isovue 370 intravenous COMPARISON:  Radiograph 01/31/2017, CT chest 12/22/2016, 10/05/2016 FINDINGS: Cardiovascular: Satisfactory opacification of the pulmonary arteries to the segmental level. Small nonocclusive filling defect within a left lower lobe segmental/subsegmental pulmonary artery branch vessel. RV LV ratio is normal. No other filling defects are visualized. Non aneurysmal aorta. No dissection. Nonenlarged heart. No pericardial effusion. Right-sided central venous port tip within the SVC. Minimal atherosclerosis. Mediastinum/Nodes: No significantly enlarged mediastinal lymph nodes. Midline trachea. No thyroid mass. Esophagus within normal limits. Lungs/Pleura: Multiple bilateral pulmonary nodules are again visualized. Index right middle lobe pulmonary nodule, series 7, image number 44, measures 7 mm compared to 5 mm previously. Index left upper lobe pulmonary nodule, series 7, image number 37 measures 8 mm, compared with 6 mm previously. No pleural effusion. No acute consolidation. Upper Abdomen: Peritoneal carcinomatosis with scalloped appearance of the liver and spleen. Calcifications within the low-density surrounding the liver. Heterogenous splenic enhancement likely due to arterial phase imaging. Dilated bowel in the upper abdomen. Postsurgical changes of the stomach. Dilated gallbladder measuring up to 4.6 cm. Musculoskeletal: No acute or suspicious bone lesion Review of the MIP images confirms the above  findings. IMPRESSION: 1. Positive for small nonocclusive embolus in a left lower lobe segmental/subsegmental branch vessels. No elevation of the RV LV ratio. No other filling defects are seen. 2. Multiple bilateral pulmonary nodules. Slight increased size of several pulmonary nodules as described above. 3. Peritoneal carcinomatosis partially imaged. 4. Dilated bowel in the upper abdomen, could relate to a bowel obstruction 5. Dilated gallbladder. Critical Value/emergent results were called by telephone at the time of interpretation on 02/01/2017 at 12:37 am to [place] who assumed care for the patient for [facility] , who verbally acknowledged these results. Aortic Atherosclerosis (ICD10-I70.0). Electronically Signed   By: Donavan Foil M.D.   On: 02/01/2017 00:38    Procedures Procedures (including critical care time)  Medications Ordered in ED Medications  iopamidol (ISOVUE-370) 76 % injection (not administered)  heparin bolus via infusion 2,000 Units (not administered)  heparin ADULT infusion 100 units/mL (25000 units/233mL sodium chloride 0.45%) (not administered)  iopamidol (ISOVUE-370) 76 % injection 100 mL (55 mLs Intravenous Contrast Given 02/01/17 0000)  sodium chloride 0.9 % bolus 500 mL (500 mLs Intravenous New Bag/Given 02/01/17 0040)     Initial Impression / Assessment and Plan / ED Course  I have reviewed the triage vital signs and the nursing notes.  Pertinent labs & imaging results that were available during my care of the patient were reviewed by me and considered in my medical decision making (see chart for details).     51 yo F with current diagnosis of stage III colon cancer who presents with 3 days of intermittent chest pressure that became constant this afternoon. Associated with some shortness of breath. Patient is afebrile, non-toxic appearing, sitting comfortably on examination table. Vital signs reviewed. Patient is slightly hypertensive and tachycardic. O2 sats >95% on  RA.  Consider cardiac etiology vs ACS vs acute infectious etiology. Also consider PE given cancer diagnosis and tachycardia. Will check basic labs including CBC, BMP, troponin, EKG, chest x-ray. Will also check d-dimer for evaluation of PE. Patient offered analgesics in the department but she declined at this time. Patient reports that her pain improved after she took Percocet  at home.   Labs and imaging reviewed. CBC shows slight elevation in WBC. Hgb/Hct is 10.6/33.0. Initial troponin is negative. CXR is negative for any acute infectious etiology. EKG shows sinus tachycardia with some right heart strain.  D-Dimer is pending.  Discussed results with patient. Patient is still not having any pain but having still having some SOB. Offered pain medications but patient declines at this time. D-Dimer is pending.   BMP reviewed. Patient is hyponatremic at 126. Based on BMP, patient has a serum osmol of 263. Hyponatremia will likely require inpatient admission. D-Dimer reviewed and positive. Will plan to CTA Chest for further evaluation of PE. Discussed results with patient.   Patient signed out to [employer], PA-C at shift change with CTA pending.  Final Clinical Impressions(s) / ED Diagnoses   Final diagnoses:  Other acute pulmonary embolism without acute cor pulmonale ([place])  Hyponatremia    New Prescriptions New Prescriptions   No medications on file     Desma Mcgregor 02/01/17 0113    Malvin Johns, MD 02/01/17 [phone]

## 2017-01-31 NOTE — ED Notes (Signed)
ED Provider at bedside. 

## 2017-01-31 NOTE — Telephone Encounter (Signed)
Pt called requesting a call back from nurse.  Spoke with pt and was informed that pt experiencing chest pressure and tightness , and can hardly catch her breath today.  Pt wanted to know if she could come in tomorrow for office visit. Instructed pt to go to ER immediately - and NOT wait - since pt has problems with breathing along with chest tightness.   Pt voiced understanding. Pt's   Phone     [phone].

## 2017-02-01 ENCOUNTER — Encounter (HOSPITAL_COMMUNITY): Payer: Self-pay

## 2017-02-01 ENCOUNTER — Other Ambulatory Visit (HOSPITAL_COMMUNITY): Payer: 59

## 2017-02-01 DIAGNOSIS — Z91040 Latex allergy status: Secondary | ICD-10-CM | POA: Diagnosis not present

## 2017-02-01 DIAGNOSIS — I2699 Other pulmonary embolism without acute cor pulmonale: Principal | ICD-10-CM

## 2017-02-01 DIAGNOSIS — E871 Hypo-osmolality and hyponatremia: Secondary | ICD-10-CM | POA: Diagnosis present

## 2017-02-01 DIAGNOSIS — Z79899 Other long term (current) drug therapy: Secondary | ICD-10-CM | POA: Diagnosis not present

## 2017-02-01 DIAGNOSIS — K56600 Partial intestinal obstruction, unspecified as to cause: Secondary | ICD-10-CM | POA: Diagnosis not present

## 2017-02-01 DIAGNOSIS — E43 Unspecified severe protein-calorie malnutrition: Secondary | ICD-10-CM | POA: Diagnosis present

## 2017-02-01 DIAGNOSIS — C787 Secondary malignant neoplasm of liver and intrahepatic bile duct: Secondary | ICD-10-CM

## 2017-02-01 DIAGNOSIS — Z79891 Long term (current) use of opiate analgesic: Secondary | ICD-10-CM | POA: Diagnosis not present

## 2017-02-01 DIAGNOSIS — C189 Malignant neoplasm of colon, unspecified: Secondary | ICD-10-CM | POA: Diagnosis not present

## 2017-02-01 DIAGNOSIS — M791 Myalgia: Secondary | ICD-10-CM

## 2017-02-01 DIAGNOSIS — Z8041 Family history of malignant neoplasm of ovary: Secondary | ICD-10-CM | POA: Diagnosis not present

## 2017-02-01 DIAGNOSIS — Z880 Allergy status to penicillin: Secondary | ICD-10-CM | POA: Diagnosis not present

## 2017-02-01 DIAGNOSIS — C182 Malignant neoplasm of ascending colon: Secondary | ICD-10-CM | POA: Diagnosis present

## 2017-02-01 DIAGNOSIS — F419 Anxiety disorder, unspecified: Secondary | ICD-10-CM | POA: Diagnosis present

## 2017-02-01 DIAGNOSIS — Z801 Family history of malignant neoplasm of trachea, bronchus and lung: Secondary | ICD-10-CM | POA: Diagnosis not present

## 2017-02-01 DIAGNOSIS — C786 Secondary malignant neoplasm of retroperitoneum and peritoneum: Secondary | ICD-10-CM | POA: Diagnosis present

## 2017-02-01 DIAGNOSIS — Z90710 Acquired absence of both cervix and uterus: Secondary | ICD-10-CM | POA: Diagnosis not present

## 2017-02-01 DIAGNOSIS — Z808 Family history of malignant neoplasm of other organs or systems: Secondary | ICD-10-CM | POA: Diagnosis not present

## 2017-02-01 DIAGNOSIS — G8929 Other chronic pain: Secondary | ICD-10-CM

## 2017-02-01 DIAGNOSIS — Z9049 Acquired absence of other specified parts of digestive tract: Secondary | ICD-10-CM | POA: Diagnosis not present

## 2017-02-01 DIAGNOSIS — Z888 Allergy status to other drugs, medicaments and biological substances status: Secondary | ICD-10-CM | POA: Diagnosis not present

## 2017-02-01 DIAGNOSIS — K5669 Other partial intestinal obstruction: Secondary | ICD-10-CM | POA: Diagnosis present

## 2017-02-01 DIAGNOSIS — Z800 Family history of malignant neoplasm of digestive organs: Secondary | ICD-10-CM | POA: Diagnosis not present

## 2017-02-01 DIAGNOSIS — Z6820 Body mass index (BMI) 20.0-20.9, adult: Secondary | ICD-10-CM | POA: Diagnosis not present

## 2017-02-01 DIAGNOSIS — M797 Fibromyalgia: Secondary | ICD-10-CM | POA: Diagnosis present

## 2017-02-01 DIAGNOSIS — Z933 Colostomy status: Secondary | ICD-10-CM | POA: Diagnosis not present

## 2017-02-01 LAB — COMPREHENSIVE METABOLIC PANEL
ALBUMIN: 2 g/dL — AB (ref 3.5–5.0)
ALT: 14 U/L (ref 14–54)
AST: 17 U/L (ref 15–41)
Alkaline Phosphatase: 158 U/L — ABNORMAL HIGH (ref 38–126)
Anion gap: 8 (ref 5–15)
BILIRUBIN TOTAL: 0.8 mg/dL (ref 0.3–1.2)
BUN: 12 mg/dL (ref 6–20)
CALCIUM: 7.6 mg/dL — AB (ref 8.9–10.3)
CO2: 23 mmol/L (ref 22–32)
Chloride: 99 mmol/L — ABNORMAL LOW (ref 101–111)
Creatinine, Ser: 0.43 mg/dL — ABNORMAL LOW (ref 0.44–1.00)
GFR calc Af Amer: >60 mL/min (ref >60)
GLUCOSE: 79 mg/dL (ref 65–99)
POTASSIUM: 3.8 mmol/L (ref 3.5–5.1)
Sodium: 130 mmol/L — ABNORMAL LOW (ref 135–145)
TOTAL PROTEIN: 5.3 g/dL — AB (ref 6.5–8.1)

## 2017-02-01 LAB — CBC
HCT: 30.9 % — ABNORMAL LOW (ref 36.0–46.0)
HEMOGLOBIN: 10 g/dL — AB (ref 12.0–15.0)
MCH: 25.5 pg — AB (ref 26.0–34.0)
MCHC: 32.4 g/dL (ref 30.0–36.0)
MCV: 78.8 fL (ref 78.0–100.0)
PLATELETS: 309 10*3/uL (ref 150–400)
RBC: 3.92 MIL/uL (ref 3.87–5.11)
RDW: 18.2 % — ABNORMAL HIGH (ref 11.5–15.5)
WBC: 9.6 10*3/uL (ref 4.0–10.5)

## 2017-02-01 LAB — TROPONIN I
Troponin I: <0.03 ng/mL (ref <0.03)
Troponin I: <0.03 ng/mL (ref <0.03)
Troponin I: <0.03 ng/mL (ref <0.03)

## 2017-02-01 LAB — HEPARIN LEVEL (UNFRACTIONATED): Heparin Unfractionated: 0.21 IU/mL — ABNORMAL LOW (ref 0.30–0.70)

## 2017-02-01 LAB — PROTIME-INR
INR: 2.24
INR: 2.01
PROTHROMBIN TIME: 25.1 s — AB (ref 11.4–15.2)
PROTHROMBIN TIME: 23.1 s — AB (ref 11.4–15.2)

## 2017-02-01 LAB — OSMOLALITY: OSMOLALITY: 268 mosm/kg — AB (ref 275–295)

## 2017-02-01 LAB — TSH: TSH: 1.307 u[IU]/mL (ref 0.350–4.500)

## 2017-02-01 LAB — GLUCOSE, CAPILLARY: Glucose-Capillary: 79 mg/dL (ref 65–99)

## 2017-02-01 LAB — SODIUM, URINE, RANDOM: Sodium, Ur: <10 mmol/L

## 2017-02-01 LAB — APTT: aPTT: 53 seconds — ABNORMAL HIGH (ref 24–36)

## 2017-02-01 LAB — OSMOLALITY, URINE: OSMOLALITY UR: 985 mosm/kg — AB (ref 300–900)

## 2017-02-01 LAB — POCT I-STAT TROPONIN I: TROPONIN I, POC: 0 ng/mL (ref 0.00–0.08)

## 2017-02-01 MED ORDER — PROCHLORPERAZINE MALEATE 10 MG PO TABS: 10.0000 mg | ORAL_TABLET | Freq: Four times a day (QID) | ORAL | Status: DC | PRN

## 2017-02-01 MED ORDER — SODIUM CHLORIDE 0.9% FLUSH
10.0000 mL | INTRAVENOUS | Status: DC | PRN
  Administered 2017-02-01 (×2): 10 mL
  Filled 2017-02-01 (×2): qty 40

## 2017-02-01 MED ORDER — COBIMETINIB FUMARATE 20 MG PO TABS: 60.0000 mg | ORAL_TABLET | Freq: Every day | ORAL | Status: DC

## 2017-02-01 MED ORDER — HEPARIN BOLUS VIA INFUSION
2000.0000 [IU] | Freq: Once | INTRAVENOUS | Status: DC
  Filled 2017-02-01: qty 2000

## 2017-02-01 MED ORDER — ACETAMINOPHEN 650 MG RE SUPP: 650.0000 mg | Freq: Four times a day (QID) | RECTAL | Status: DC | PRN

## 2017-02-01 MED ORDER — LIDOCAINE-PRILOCAINE 2.5-2.5 % EX CREA: 1.0000 "application " | TOPICAL_CREAM | CUTANEOUS | Status: DC | PRN

## 2017-02-01 MED ORDER — CLINDAMYCIN PHOSPHATE 1 % EX SOLN
Freq: Two times a day (BID) | CUTANEOUS | Status: DC
  Filled 2017-02-01: qty 30

## 2017-02-01 MED ORDER — SODIUM CHLORIDE 0.9 % IV BOLUS (SEPSIS)
500.0000 mL | Freq: Once | INTRAVENOUS | Status: AC
  Administered 2017-02-01: 500 mL via INTRAVENOUS

## 2017-02-01 MED ORDER — HEPARIN (PORCINE) IN NACL 100-0.45 UNIT/ML-% IJ SOLN
1100.0000 [IU]/h | INTRAMUSCULAR | Status: DC
  Administered 2017-02-02: 1100 [IU]/h via INTRAVENOUS
  Filled 2017-02-01: qty 250

## 2017-02-01 MED ORDER — HEPARIN (PORCINE) IN NACL 100-0.45 UNIT/ML-% IJ SOLN
800.0000 [IU]/h | INTRAMUSCULAR | Status: DC
  Administered 2017-02-01: 800 [IU]/h via INTRAVENOUS
  Filled 2017-02-01: qty 250

## 2017-02-01 MED ORDER — SODIUM CHLORIDE 0.9% FLUSH: 10.0000 mL | Freq: Two times a day (BID) | INTRAVENOUS | Status: DC

## 2017-02-01 MED ORDER — ACETAMINOPHEN 325 MG PO TABS: 650.0000 mg | ORAL_TABLET | Freq: Four times a day (QID) | ORAL | Status: DC | PRN

## 2017-02-01 MED ORDER — HEPARIN (PORCINE) IN NACL 100-0.45 UNIT/ML-% IJ SOLN: 1000.0000 [IU]/h | INTRAMUSCULAR | Status: DC

## 2017-02-01 MED ORDER — HEPARIN BOLUS VIA INFUSION
1000.0000 [IU] | Freq: Once | INTRAVENOUS | Status: AC
  Administered 2017-02-01: 1000 [IU] via INTRAVENOUS
  Filled 2017-02-01: qty 1000

## 2017-02-01 MED ORDER — ONDANSETRON HCL 4 MG/2ML IJ SOLN: 4.0000 mg | Freq: Four times a day (QID) | INTRAMUSCULAR | Status: DC | PRN

## 2017-02-01 MED ORDER — TRIAMCINOLONE ACETONIDE 0.5 % EX OINT
1.0000 "application " | TOPICAL_OINTMENT | Freq: Two times a day (BID) | CUTANEOUS | Status: DC
  Filled 2017-02-01: qty 15

## 2017-02-01 MED ORDER — OXYCODONE HCL 5 MG PO TABS
10.0000 mg | ORAL_TABLET | ORAL | Status: DC
  Administered 2017-02-01 – 2017-02-02 (×8): 10 mg via ORAL
  Filled 2017-02-01 (×8): qty 2

## 2017-02-01 MED ORDER — HEPARIN (PORCINE) IN NACL 100-0.45 UNIT/ML-% IJ SOLN
900.0000 [IU]/h | INTRAMUSCULAR | Status: DC
  Filled 2017-02-01: qty 250

## 2017-02-01 MED ORDER — ZOLPIDEM TARTRATE 5 MG PO TABS: 5.0000 mg | ORAL_TABLET | Freq: Every evening | ORAL | Status: DC | PRN

## 2017-02-01 MED ORDER — CYCLOBENZAPRINE HCL 10 MG PO TABS
10.0000 mg | ORAL_TABLET | Freq: Three times a day (TID) | ORAL | Status: DC | PRN
Start: 2017-02-01 — End: 2017-02-02

## 2017-02-01 MED ORDER — ALPRAZOLAM 0.25 MG PO TABS
0.2500 mg | ORAL_TABLET | Freq: Every evening | ORAL | Status: DC | PRN
  Administered 2017-02-01: 0.25 mg via ORAL
  Filled 2017-02-01: qty 2

## 2017-02-01 MED ORDER — MORPHINE SULFATE ER 30 MG PO TBCR
30.0000 mg | EXTENDED_RELEASE_TABLET | Freq: Every day | ORAL | Status: DC
  Administered 2017-02-01: 30 mg via ORAL
  Filled 2017-02-01: qty 1

## 2017-02-01 MED ORDER — TRAMADOL HCL 50 MG PO TABS
50.0000 mg | ORAL_TABLET | Freq: Four times a day (QID) | ORAL | Status: DC | PRN
  Administered 2017-02-01: 100 mg via ORAL
  Filled 2017-02-01: qty 2

## 2017-02-01 MED ORDER — ONDANSETRON HCL 4 MG PO TABS: 4.0000 mg | ORAL_TABLET | Freq: Four times a day (QID) | ORAL | Status: DC | PRN

## 2017-02-01 MED ORDER — SODIUM CHLORIDE 0.9% FLUSH: 3.0000 mL | Freq: Two times a day (BID) | INTRAVENOUS | Status: DC

## 2017-02-01 NOTE — H&P (Signed)
History and Physical    [name] WIO:[id number] DOB: Sep 14, 1965 DOA: 01/31/2017  Referring MD/NP/PA:   PCP: Carol Ada, MD   Patient coming from:  The patient is coming from home.  At baseline, pt is independent for most of ADL.   Chief Complaint: Chest pain, shortness breath  HPI: [name] is a 51 y.o. female with medical history significant of GERD, depression, anxiety, fibromyalgia, colon cancer-stage 3 (s/p of colectomy and colostomy), anemia, who presents with chest pain or shortness breath.  Patient states that she has been having intermittent chest pain for almost 3 days. It is located in the substernal area, pressure-like, 8 out of 10 in severity, nonradiating. It is associated with SOB. No fever or chills. Patient denies cough. No recent long distant traveling. Denies tenderness in the calf areas. Patient does not have nausea, vomiting, diarrhea, abdominal pain. No symptoms of UTI or unilateral weakness. Patient speaks in full sentence.  ED Course: pt was found to have D-dimer 1.56, WBC 12.8, INR 2.24, PTT 53, sodium 126, creatinine normal, temperature normal, tachycardia, oxygen saturation 100% on room air, negative chest x-ray for infiltration. Pt is placed on tele bed for obs.  CT angiogram showed:  1. Positive for small nonocclusive embolus in a left lower lobe segmental/subsegmental branch vessels. No elevation of the RV LV ratio. No other filling defects are seen. 2. Multiple bilateral pulmonary nodules. Slight increased size of several pulmonary nodules as described above. 3. Peritoneal carcinomatosis partially imaged. 4. Dilated bowel in the upper abdomen, could relate to a bowel obstruction 5. Dilated gallbladder.  Review of Systems:   General: no fevers, chills, no changes in body weight, has poor appetite, has fatigue HEENT: no blurry vision, hearing changes or sore throat Respiratory: has dyspnea, no coughing, wheezing CV: has chest pain, no  palpitations GI: no nausea, vomiting, abdominal pain, diarrhea, constipation GU: no dysuria, burning on urination, increased urinary frequency, hematuria  Ext: no leg edema Neuro: no unilateral weakness, numbness, or tingling, no vision change or hearing loss Skin: no rash, no skin tear. MSK: No muscle spasm, no deformity, no limitation of range of movement in spin Heme: No easy bruising.  Travel history: No recent long distant travel.  Allergy:  Allergies  Allergen Reactions  . Latex Hives  . Cymbalta [Duloxetine Hcl] Rash  . Penicillins Rash    Has patient had a PCN reaction causing immediate rash, facial/tongue/throat swelling, SOB or lightheadedness with hypotension: No Has patient had a PCN reaction causing severe rash involving mucus membranes or skin necrosis: No Has patient had a PCN reaction that required hospitalization No Has patient had a PCN reaction occurring within the last 10 years: No If all of the above answers are "NO", then may proceed with Cephalosporin use.     Past Medical History:  Diagnosis Date  . Anemia   . Anxiety   . Cancer ([place])    colon cancer stage 3  . Constipation   . Depression   . Fibromyalgia   . GERD (gastroesophageal reflux disease)   . JP drain bleeding     Past Surgical History:  Procedure Laterality Date  . ABDOMINAL HYSTERECTOMY    . COLON SURGERY  08/03/2015  . COLOSTOMY REVISION N/A 08/03/2015   Procedure: COLON RESECTION RIGHT;  Surgeon: Rolm Bookbinder, MD;  Location: [facility];  Service: General;  Laterality: N/A;  . GASTRIC BYPASS    . IR GENERIC HISTORICAL  05/30/2016   IR FLUORO GUIDE PORT INSERTION RIGHT 05/30/2016  Aletta Edouard, MD [facility]  . IR GENERIC HISTORICAL  05/30/2016   IR US GUIDE VASC ACCESS RIGHT 05/30/2016 Aletta Edouard, MD [facility]  . LAPAROSCOPIC ILEOCECECTOMY Right 08/03/2015   Procedure: LAPAROSCOPIC DIAGNOSTIC RIGHT COLECTOMY;  Surgeon: Rolm Bookbinder, MD;  Location: [facility];  Service: General;   Laterality: Right;  . LAPAROSCOPY N/A 10/13/2016   Procedure: left upper quadrant LOOP COLOSTOMY;  Surgeon: Greer Pickerel, MD;  Location: [facility];  Service: General;  Laterality: N/A;    Social History:  reports that she has never smoked. She has never used smokeless tobacco. She reports that she drinks about 1.2 oz of alcohol per week . She reports that she does not use drugs.  Family History:  Family History  Problem Relation Age of Onset  . Cancer Father 73       small cell lung cancer and colon cancer   . Cancer Sister 50       small cell lung cancer  . Cancer Maternal Aunt        ovarian cancer  . Cancer Paternal Uncle        brain cancer   . Cancer Other 40       ovarian cancer (maternal niece)     Prior to Admission medications   Medication Sig Start Date End Date Taking? Authorizing Provider  ALPRAZolam (XANAX) 0.25 MG tablet Take 1-2 tablets (0.25-0.5 mg total) by mouth at bedtime as needed for anxiety or sleep. 01/23/17  Yes Truitt Merle, MD  cobimetinib fumarate (COTELLIC) 20 MG tablet Take 3 tablets (60 mg total) by mouth daily. Take on days 1-21. Repeat every 28 days. Avoid sun exposure. 01/23/17  Yes Truitt Merle, MD  cyclobenzaprine (FLEXERIL) 10 MG tablet Take 1 tablet (10 mg total) by mouth 3 (three) times daily as needed for muscle spasms. 03/18/16  Yes Truitt Merle, MD  morphine (MS CONTIN) 30 MG 12 hr tablet Take 1 tablet (30 mg total) by mouth every 12 (twelve) hours. Patient taking differently: Take 30 mg by mouth at bedtime.  01/23/17  Yes Truitt Merle, MD  Oxycodone HCl 10 MG TABS Take 1 tablet (10 mg total) by mouth every 4 (four) hours. 01/23/17  Yes Truitt Merle, MD  prochlorperazine (COMPAZINE) 10 MG tablet Take 1 tablet (10 mg total) by mouth every 6 (six) hours as needed for nausea or vomiting. 01/23/17  Yes Truitt Merle, MD  traMADol (ULTRAM) 50 MG tablet Take 1-2 tablets (50-100 mg total) by mouth every 6 (six) hours as needed. Patient taking differently: Take 50-100 mg by mouth every  6 (six) hours as needed for severe pain.  04/20/16  Yes Truitt Merle, MD  clindamycin (CLINDAGEL) 1 % gel Apply topically 2 (two) times daily. 01/23/17   Truitt Merle, MD  lidocaine-prilocaine (EMLA) cream Apply 1 application topically as needed. 05/23/16   Truitt Merle, MD  triamcinolone ointment (KENALOG) 0.5 % Apply 1 application topically 2 (two) times daily. 01/23/17   Truitt Merle, MD    Physical Exam: Vitals:   01/31/17 2125 02/01/17 0100 02/01/17 0200 02/01/17 0300  BP: 127/90 (!) 137/97 (!) 129/96 (!) 121/92  Pulse: (!) 101 98 (!) 101 98  Resp: 18 20 18 20   Temp:   98.8 F (37.1 C)   TempSrc:   Oral   SpO2: 100% 98% 99% 98%  Weight:      Height:       General: Not in acute distress HEENT:       Eyes: PERRL,  EOMI, no scleral icterus.       ENT: No discharge from the ears and nose, no pharynx injection, no tonsillar enlargement.        Neck: No JVD, no bruit, no mass felt. Heme: No neck lymph node enlargement. Cardiac: S1/S2, RRR, No murmurs, No gallops or rubs. Respiratory: No rales, wheezing, rhonchi or rubs. GI: Soft, nondistended, nontender, no rebound pain, no organomegaly, BS present. S/p of colostomy. GU: No hematuria Ext: No pitting leg edema bilaterally. 2+DP/PT pulse bilaterally. Musculoskeletal: No joint deformities, No joint redness or warmth, no limitation of ROM in spin. Skin: No rashes.  Neuro: Alert, oriented X3, cranial nerves II-XII grossly intact, moves all extremities normally.  Psych: Patient is not psychotic, no suicidal or hemocidal ideation.  Labs on Admission: I have personally reviewed following labs and imaging studies  CBC:  Recent Labs Lab 01/31/17 1816  WBC 12.8*  NEUTROABS 9.3*  HGB 10.6*  HCT 33.0*  MCV 78.2  PLT 268   Basic Metabolic Panel:  Recent Labs Lab 01/31/17 1816  NA 126*  K 4.0  CL 96*  CO2 22  GLUCOSE 91  BUN 16  CREATININE 0.44  CALCIUM 7.9*   GFR: Estimated Creatinine Clearance: 59.1 mL/min (by C-G formula based on SCr  of 0.44 mg/dL). Liver Function Tests: No results for input(s): AST, ALT, ALKPHOS, BILITOT, PROT, ALBUMIN in the last 168 hours. No results for input(s): LIPASE, AMYLASE in the last 168 hours. No results for input(s): AMMONIA in the last 168 hours. Coagulation Profile:  Recent Labs Lab 02/01/17 0114  INR 2.24   Cardiac Enzymes:  Recent Labs Lab 02/01/17 0248  TROPONINI <0.03   BNP (last 3 results) No results for input(s): PROBNP in the last 8760 hours. HbA1C: No results for input(s): HGBA1C in the last 72 hours. CBG: No results for input(s): GLUCAP in the last 168 hours. Lipid Profile: No results for input(s): CHOL, HDL, LDLCALC, TRIG, CHOLHDL, LDLDIRECT in the last 72 hours. Thyroid Function Tests: No results for input(s): TSH, T4TOTAL, FREET4, T3FREE, THYROIDAB in the last 72 hours. Anemia Panel: No results for input(s): VITAMINB12, FOLATE, FERRITIN, TIBC, IRON, RETICCTPCT in the last 72 hours. Urine analysis:    Component Value Date/Time   COLORURINE YELLOW 10/09/2016 1629   APPEARANCEUR HAZY (A) 10/09/2016 1629   LABSPEC 1.023 10/09/2016 1629   LABSPEC 1.010 10/04/2016 1519   PHURINE 8.0 10/09/2016 1629   GLUCOSEU NEGATIVE 10/09/2016 1629   GLUCOSEU Negative 10/04/2016 1519   HGBUR NEGATIVE 10/09/2016 1629   BILIRUBINUR NEGATIVE 10/09/2016 1629   BILIRUBINUR Negative 10/04/2016 1519   KETONESUR 20 (A) 10/09/2016 1629   PROTEINUR 100 (A) 10/09/2016 1629   UROBILINOGEN 0.2 10/04/2016 1519   NITRITE NEGATIVE 10/09/2016 1629   LEUKOCYTESUR NEGATIVE 10/09/2016 1629   LEUKOCYTESUR Negative 10/04/2016 1519   Sepsis Labs: @LABRCNTIP (procalcitonin:4,lacticidven:4) )No results found for this or any previous visit (from the past 240 hour(s)).   Radiological Exams on Admission: Dg Chest 2 View  Result Date: 01/31/2017 CLINICAL DATA:  51 y/o  F; chest pain. EXAM: CHEST  2 VIEW COMPARISON:  01/08/2017 chest radiograph.  12/22/2016 chest CT there FINDINGS: Stable normal  cardiac silhouette. Right port catheter tip projects over lower SVC. No consolidation, effusion, or pneumothorax. Pulmonary nodules on CT are not visualized radiographically. No acute osseous abnormality is evident. IMPRESSION: No acute pulmonary process identified. Electronically Signed   By: Kristine Garbe M.D.   On: 01/31/2017 18:33   Ct Angio Chest Pe W And/or Wo  Contrast  Result Date: 02/01/2017 CLINICAL DATA:  Chest pressure for 3 days EXAM: CT ANGIOGRAPHY CHEST WITH CONTRAST TECHNIQUE: Multidetector CT imaging of the chest was performed using the standard protocol during bolus administration of intravenous contrast. Multiplanar CT image reconstructions and MIPs were obtained to evaluate the vascular anatomy. CONTRAST:  55 mL Isovue 370 intravenous COMPARISON:  Radiograph 01/31/2017, CT chest 12/22/2016, 10/05/2016 FINDINGS: Cardiovascular: Satisfactory opacification of the pulmonary arteries to the segmental level. Small nonocclusive filling defect within a left lower lobe segmental/subsegmental pulmonary artery branch vessel. RV LV ratio is normal. No other filling defects are visualized. Non aneurysmal aorta. No dissection. Nonenlarged heart. No pericardial effusion. Right-sided central venous port tip within the SVC. Minimal atherosclerosis. Mediastinum/Nodes: No significantly enlarged mediastinal lymph nodes. Midline trachea. No thyroid mass. Esophagus within normal limits. Lungs/Pleura: Multiple bilateral pulmonary nodules are again visualized. Index right middle lobe pulmonary nodule, series 7, image number 44, measures 7 mm compared to 5 mm previously. Index left upper lobe pulmonary nodule, series 7, image number 37 measures 8 mm, compared with 6 mm previously. No pleural effusion. No acute consolidation. Upper Abdomen: Peritoneal carcinomatosis with scalloped appearance of the liver and spleen. Calcifications within the low-density surrounding the liver. Heterogenous splenic enhancement  likely due to arterial phase imaging. Dilated bowel in the upper abdomen. Postsurgical changes of the stomach. Dilated gallbladder measuring up to 4.6 cm. Musculoskeletal: No acute or suspicious bone lesion Review of the MIP images confirms the above findings. IMPRESSION: 1. Positive for small nonocclusive embolus in a left lower lobe segmental/subsegmental branch vessels. No elevation of the RV LV ratio. No other filling defects are seen. 2. Multiple bilateral pulmonary nodules. Slight increased size of several pulmonary nodules as described above. 3. Peritoneal carcinomatosis partially imaged. 4. Dilated bowel in the upper abdomen, could relate to a bowel obstruction 5. Dilated gallbladder. Critical Value/emergent results were called by telephone at the time of interpretation on 02/01/2017 at 12:37 am to [place] who assumed care for the patient for [facility] , who verbally acknowledged these results. Aortic Atherosclerosis (ICD10-I70.0). Electronically Signed   By: Donavan Foil M.D.   On: 02/01/2017 00:38     EKG: Independently reviewed. Sinus rhythm, QTC 465, nonspecific T-wave change   Assessment/Plan Principal Problem:   PE (pulmonary thromboembolism) ([facility]) Active Problems:   S/P colectomy   Cancer of right colon ([facility])   Hyponatremia   Anxiety   PE (pulmonary thromboembolism) ([place]):  CTA showed small nonocclusive embolus in a left lower lobe segmental/subsegmental branch vessels. No CT evidence of a right heart screening. Currently patient is hemodynamically stable.  -place in tele bed for obs  -heparin drip initiated -2D echocardiogram ordered -LE dopplers ordered to evaluate for DVT -trop x 3  Cancer of right colon: s/p of colectomy and colostomy. Pt is oral chemotherapy, Cobimetinib -continue cobimetinib -f/u with her oncologist, Dr. Burr Medico  Fibromyalgia: -Continue home MS Contin, oxycodone and when necessary tramadol  Hyponatremia: Na 126. Mental status normal, likely due to  poor oral intake and the GI loss. - Will check urine sodium, urine osmolality, serum osmolality. - check TSH - IVF: 560mL NS in ED, will continue with IV normal saline at 75 mL/h - f/u by BMP  Anxiety: -prn xanax   DVT ppx: on IV Heparin Code Status: Full code Family Communication: None at bed side.    Disposition Plan:  Anticipate discharge back to previous home environment Consults called:  none Admission status: Obs / tele  Date of Service 02/01/2017    Ivor Costa [facility] Pager [phone]  If 7PM-7AM, please contact night-coverage [email or web address] Password [email or web address] 02/01/2017, 4:08 AM

## 2017-02-01 NOTE — Progress Notes (Signed)
[name]   DOB:Feb 10, 1966   GU#:[id number]   SEG#:[id number]  Oncology follow-up note  Subjective: Patient is well-known to me, under my care for her metastatic colon cancer. She presents to emergency room yesterday for chest tightness and shortness breast. CT scan showed new PE. She feels better today. She is on heparin drip.   Objective:  Vitals:   02/01/17 1300 02/01/17 2127  BP: (!) 121/93 117/83  Pulse: 100 96  Resp: 18 18  Temp: 97.7 F (36.5 C) 98.3 F (36.8 C)    Body mass index is 20.86 kg/m.  Intake/Output Summary (Last 24 hours) at 02/01/17 2314 Last data filed at 02/01/17 2200  Gross per 24 hour  Intake            470.1 ml  Output                0 ml  Net            470.1 ml     Sclerae unicteric  Oropharynx clear  No peripheral adenopathy  Lungs clear -- no rales or rhonchi  Heart regular rate and rhythm  Abdomen mild tenderness   MSK no focal spinal tenderness, no peripheral edema  Neuro nonfocal    CBG (last 3)   Recent Labs  02/01/17 0740  GLUCAP 79     Labs:  Lab Results  Component Value Date   WBC 9.6 02/01/2017   HGB 10.0 (L) 02/01/2017   HCT 30.9 (L) 02/01/2017   MCV 78.8 02/01/2017   PLT 309 02/01/2017   NEUTROABS 9.3 (H) 01/31/2017   CMP Latest Ref Rng & Units 02/01/2017 01/31/2017 01/23/2017  Glucose 65 - 99 mg/dL 79 91 102  BUN 6 - 20 mg/dL 12 16 17.1  Creatinine 0.44 - 1.00 mg/dL 0.43(L) 0.44 0.6  Sodium 135 - 145 mmol/L 130(L) 126(L) 136  Potassium 3.5 - 5.1 mmol/L 3.8 4.0 4.1  Chloride 101 - 111 mmol/L 99(L) 96(L) -  CO2 22 - 32 mmol/L 23 22 32(H)  Calcium 8.9 - 10.3 mg/dL 7.6(L) 7.9(L) 8.9  Total Protein 6.5 - 8.1 g/dL 5.3(L) - 6.9  Total Bilirubin 0.3 - 1.2 mg/dL 0.8 - 0.23  Alkaline Phos 38 - 126 U/L 158(H) - 108  AST 15 - 41 U/L 17 - 13  ALT 14 - 54 U/L 14 - 8     Urine Studies No results for input(s): UHGB, CRYS in the last 72 hours.  Invalid input(s): UACOL, UAPR, USPG, UPH, UTP, UGL, UKET, UBIL, UNIT, UROB,  ULEU, UEPI, UWBC, URBC, UBAC, CAST, UCOM, BILUA  Basic Metabolic Panel:  Recent Labs Lab 01/31/17 1816 02/01/17 0750  NA 126* 130*  K 4.0 3.8  CL 96* 99*  CO2 22 23  GLUCOSE 91 79  BUN 16 12  CREATININE 0.44 0.43*  CALCIUM 7.9* 7.6*   GFR Estimated Creatinine Clearance: 59.1 mL/min (A) (by C-G formula based on SCr of 0.43 mg/dL (L)). Liver Function Tests:  Recent Labs Lab 02/01/17 0750  AST 17  ALT 14  ALKPHOS 158*  BILITOT 0.8  PROT 5.3*  ALBUMIN 2.0*   No results for input(s): LIPASE, AMYLASE in the last 168 hours. No results for input(s): AMMONIA in the last 168 hours. Coagulation profile  Recent Labs Lab 02/01/17 0114 02/01/17 1841  INR 2.24 2.01    CBC:  Recent Labs Lab 01/31/17 1816 02/01/17 0750  WBC 12.8* 9.6  NEUTROABS 9.3*  --   HGB 10.6* 10.0*  HCT 33.0* 30.9*  MCV 78.2 78.8  PLT 327 309   Cardiac Enzymes:  Recent Labs Lab 02/01/17 0248 02/01/17 0750 02/01/17 1517  TROPONINI <0.03 <0.03 <0.03   BNP: Invalid input(s): POCBNP CBG:  Recent Labs Lab 02/01/17 0740  GLUCAP 79   D-Dimer  Recent Labs  01/31/17 2012  DDIMER 1.56*   Hgb A1c No results for input(s): HGBA1C in the last 72 hours. Lipid Profile No results for input(s): CHOL, HDL, LDLCALC, TRIG, CHOLHDL, LDLDIRECT in the last 72 hours. Thyroid function studies  Recent Labs  02/01/17 0750  TSH 1.307   Anemia work up No results for input(s): VITAMINB12, FOLATE, FERRITIN, TIBC, IRON, RETICCTPCT in the last 72 hours. Microbiology No results found for this or any previous visit (from the past 240 hour(s)).    Studies:  Dg Chest 2 View  Result Date: 01/31/2017 CLINICAL DATA:  51 y/o  F; chest pain. EXAM: CHEST  2 VIEW COMPARISON:  01/08/2017 chest radiograph.  12/22/2016 chest CT there FINDINGS: Stable normal cardiac silhouette. Right port catheter tip projects over lower SVC. No consolidation, effusion, or pneumothorax. Pulmonary nodules on CT are not  visualized radiographically. No acute osseous abnormality is evident. IMPRESSION: No acute pulmonary process identified. Electronically Signed   By: Kristine Garbe M.D.   On: 01/31/2017 18:33   Ct Angio Chest Pe W And/or Wo Contrast  Result Date: 02/01/2017 CLINICAL DATA:  Chest pressure for 3 days EXAM: CT ANGIOGRAPHY CHEST WITH CONTRAST TECHNIQUE: Multidetector CT imaging of the chest was performed using the standard protocol during bolus administration of intravenous contrast. Multiplanar CT image reconstructions and MIPs were obtained to evaluate the vascular anatomy. CONTRAST:  55 mL Isovue 370 intravenous COMPARISON:  Radiograph 01/31/2017, CT chest 12/22/2016, 10/05/2016 FINDINGS: Cardiovascular: Satisfactory opacification of the pulmonary arteries to the segmental level. Small nonocclusive filling defect within a left lower lobe segmental/subsegmental pulmonary artery branch vessel. RV LV ratio is normal. No other filling defects are visualized. Non aneurysmal aorta. No dissection. Nonenlarged heart. No pericardial effusion. Right-sided central venous port tip within the SVC. Minimal atherosclerosis. Mediastinum/Nodes: No significantly enlarged mediastinal lymph nodes. Midline trachea. No thyroid mass. Esophagus within normal limits. Lungs/Pleura: Multiple bilateral pulmonary nodules are again visualized. Index right middle lobe pulmonary nodule, series 7, image number 44, measures 7 mm compared to 5 mm previously. Index left upper lobe pulmonary nodule, series 7, image number 37 measures 8 mm, compared with 6 mm previously. No pleural effusion. No acute consolidation. Upper Abdomen: Peritoneal carcinomatosis with scalloped appearance of the liver and spleen. Calcifications within the low-density surrounding the liver. Heterogenous splenic enhancement likely due to arterial phase imaging. Dilated bowel in the upper abdomen. Postsurgical changes of the stomach. Dilated gallbladder measuring up  to 4.6 cm. Musculoskeletal: No acute or suspicious bone lesion Review of the MIP images confirms the above findings. IMPRESSION: 1. Positive for small nonocclusive embolus in a left lower lobe segmental/subsegmental branch vessels. No elevation of the RV LV ratio. No other filling defects are seen. 2. Multiple bilateral pulmonary nodules. Slight increased size of several pulmonary nodules as described above. 3. Peritoneal carcinomatosis partially imaged. 4. Dilated bowel in the upper abdomen, could relate to a bowel obstruction 5. Dilated gallbladder. Critical Value/emergent results were called by telephone at the time of interpretation on 02/01/2017 at 12:37 am to [place] who assumed care for the patient for [facility] , who verbally acknowledged these results. Aortic Atherosclerosis (ICD10-I70.0). Electronically Signed   By: Donavan Foil M.D.   On: 02/01/2017 00:38  Assessment: 51 y.o. with past medical history of metastatic colon cancer to peritoneum and liver, presented with chest pain and dyspnea. CT chest showed non-occlusive embolus of the left lower lobe segmental and subsegmental branch vessels.  1. PE  2. Stage IV metastatic colon cancer, on Atezolitumab and cobimetinib  3. Severe protein and calorie malnutrition 4. Chronic pain and fibromyalgia 5. Partial bowel obstruction secondary to peritoneal carcinomatosis, status post colostomy  Plan:  -She is hemodynamically stable, feels better. Likely can be discharged home tomorrow. -We discussed a/c options, she would need lifelong a/c due to her underlying malignancy. She has normal renal function, I think Xarelto is a reasonable choice due to it's convenience  -I will see her back in my clinic on 7/20 as scheduled   Truitt Merle, MD 02/01/2017  11:14 PM

## 2017-02-01 NOTE — Progress Notes (Addendum)
ANTICOAGULATION CONSULT NOTE - Follow Up  Pharmacy Consult for IV heparin Indication: pulmonary embolus  Allergies  Allergen Reactions  . Latex Hives  . Cymbalta [Duloxetine Hcl] Rash  . Penicillins Rash    Has patient had a PCN reaction causing immediate rash, facial/tongue/throat swelling, SOB or lightheadedness with hypotension: No Has patient had a PCN reaction causing severe rash involving mucus membranes or skin necrosis: No Has patient had a PCN reaction that required hospitalization No Has patient had a PCN reaction occurring within the last 10 years: No If all of the above answers are "NO", then may proceed with Cephalosporin use.     Patient Measurements: Height: 5' (152.4 cm) Weight: 106 lb 12.8 oz (48.4 kg) IBW/kg (Calculated) : 45.5 Heparin Dosing Weight: 49 kg  Vital Signs: Temp: 97.7 F (36.5 C) (07/11 1300) Temp Source: Oral (07/11 1300) BP: 121/93 (07/11 1300) Pulse Rate: 100 (07/11 1300)  Labs:  Recent Labs  01/31/17 1816 02/01/17 0114 02/01/17 0248 02/01/17 0750 02/01/17 1517 02/01/17 1841  HGB 10.6*  --   --  10.0*  --   --   HCT 33.0*  --   --  30.9*  --   --   PLT 327  --   --  309  --   --   APTT  --  53*  --   --   --   --   LABPROT  --  25.1*  --   --   --  23.1*  INR  --  2.24  --   --   --  2.01  HEPARINUNFRC  --   --   --  <0.10*  --  0.21*  CREATININE 0.44  --   --  0.43*  --   --   TROPONINI  --   --  <0.03 <0.03 <0.03  --     Estimated Creatinine Clearance: 59.1 mL/min (A) (by C-G formula based on SCr of 0.43 mg/dL (L)).   Medical History: Past Medical History:  Diagnosis Date  . Anemia   . Anxiety   . Cancer ([place])    colon cancer stage 3  . Constipation   . Depression   . Fibromyalgia   . GERD (gastroesophageal reflux disease)   . JP drain bleeding     Medications:  Scheduled:  . clindamycin   Topical BID  . cobimetinib fumarate  60 mg Oral Daily  . morphine  30 mg Oral QHS  . oxyCODONE  10 mg Oral Q4H  .  sodium chloride flush  10-40 mL Intracatheter Q12H  . sodium chloride flush  3 mL Intravenous Q12H  . triamcinolone ointment  1 application Topical BID   Infusions:  . heparin 1,000 Units/hr (02/01/17 1130)    Assessment: 21 yoF with hx of metastatic colon Ca now with + PE. IV heparin per Rx.    - Baseline coags elevated INR=2.24/PT=25.1 and aPtt=53 > Dr Blaine Hamper aware and ordered to proceed with IV heparin > will dose more conservatively and follow closely   SUBtherapeutic heparin level (= 0.21) following rate increase earlier today to 1000 units/hr  No issues reported per RN  Labs being drawn via PAC since refusing peripheral sticks (heparin infusing via PAC)  INR recheck, INR trending down 2.01 (not on warfarin)  CBC: Hgb low but stable, Plts wnl  No bleeding reported  No problems with IV site noted per RN  Goal of Therapy:  Heparin level 0.3-0.5 units/ml, dose conservatively per MD note d/t  elevated INR Monitor platelets by anticoagulation protocol: Yes   Plan:   Increase heparin to 1100 units/hr  Recheck heparin level in the morning (will also recheck PT/INR)  Daily heparin level and CBC  Doreene Eland, PharmD, BCPS.   Pager: [phone] 02/01/2017 8:07 PM

## 2017-02-01 NOTE — ED Provider Notes (Signed)
Patient signed out to me pending CT PE.  CT remarkable for PE.  Will consult hospitalist for admission.  Appreciate Dr. Blaine Hamper for admitting the patient.  CRITICAL CARE Performed by: Montine Circle   Total critical care time: 31 minutes  Critical care time was exclusive of separately billable procedures and treating other patients.  Critical care was necessary to treat or prevent imminent or life-threatening deterioration.  Critical care was time spent personally by me on the following activities: development of treatment plan with patient and/or surrogate as well as nursing, discussions with consultants, evaluation of patient's response to treatment, examination of patient, obtaining history from patient or surrogate, ordering and performing treatments and interventions, ordering and review of laboratory studies, ordering and review of radiographic studies, pulse oximetry and re-evaluation of patient's condition.    Montine Circle, PA-C 02/01/17 0128    Palumbo, April, MD 02/01/17 [phone]

## 2017-02-01 NOTE — Progress Notes (Signed)
ANTICOAGULATION CONSULT NOTE - Follow Up  Pharmacy Consult for IV heparin Indication: pulmonary embolus  Allergies  Allergen Reactions  . Latex Hives  . Cymbalta [Duloxetine Hcl] Rash  . Penicillins Rash    Has patient had a PCN reaction causing immediate rash, facial/tongue/throat swelling, SOB or lightheadedness with hypotension: No Has patient had a PCN reaction causing severe rash involving mucus membranes or skin necrosis: No Has patient had a PCN reaction that required hospitalization No Has patient had a PCN reaction occurring within the last 10 years: No If all of the above answers are "NO", then may proceed with Cephalosporin use.     Patient Measurements: Height: 5' (152.4 cm) Weight: 106 lb 12.8 oz (48.4 kg) IBW/kg (Calculated) : 45.5 Heparin Dosing Weight: 49 kg  Vital Signs: Temp: 97.9 F (36.6 C) (07/11 0503) Temp Source: Oral (07/11 0503) BP: 129/91 (07/11 0503) Pulse Rate: 93 (07/11 0503)  Labs:  Recent Labs  01/31/17 1816 02/01/17 0114 02/01/17 0248 02/01/17 0750  HGB 10.6*  --   --  10.0*  HCT 33.0*  --   --  30.9*  PLT 327  --   --  309  APTT  --  53*  --   --   LABPROT  --  25.1*  --   --   INR  --  2.24  --   --   HEPARINUNFRC  --   --   --  <0.10*  CREATININE 0.44  --   --  0.43*  TROPONINI  --   --  <0.03 <0.03    Estimated Creatinine Clearance: 59.1 mL/min (A) (by C-G formula based on SCr of 0.43 mg/dL (L)).   Medical History: Past Medical History:  Diagnosis Date  . Anemia   . Anxiety   . Cancer ([place])    colon cancer stage 3  . Constipation   . Depression   . Fibromyalgia   . GERD (gastroesophageal reflux disease)   . JP drain bleeding     Medications:  Scheduled:  . clindamycin   Topical BID  . cobimetinib fumarate  60 mg Oral Daily  . iopamidol      . morphine  30 mg Oral QHS  . oxyCODONE  10 mg Oral Q4H  . sodium chloride flush  10-40 mL Intracatheter Q12H  . sodium chloride flush  3 mL Intravenous Q12H  .  triamcinolone ointment  1 application Topical BID   Infusions:  . heparin 800 Units/hr (02/01/17 0247)    Assessment: 49 yoF with hx of metastatic colon Ca now with + PE. IV heparin per Rx.    - Baseline coags elevated INR=2.24/PT=25.1 and aPtt=53 > Dr Blaine Hamper aware and ordered to proceed with IV heparin > will dose more conservatively and follow closely   First heparin level subtherapeutic, somewhat expected on low-dose heparin  CBC: Hgb low but stable, Plts wnl  No bleeding reported  No problems with IV site noted per RN  Goal of Therapy:  Heparin level 0.3-0.7 units/ml Monitor platelets by anticoagulation protocol: Yes   Plan:   Increase heparin to 1000 units/hr  Recheck heparin level in 6hrs (will also recheck PT/INR)  Daily heparin level and CBC  Peggyann Juba, PharmD, BCPS Pager: [phone] 02/01/2017,11:03 AM

## 2017-02-01 NOTE — Progress Notes (Signed)
ANTICOAGULATION CONSULT NOTE - Initial Consult  Pharmacy Consult for IV heparin Indication: pulmonary embolus  Allergies  Allergen Reactions  . Latex Hives  . Cymbalta [Duloxetine Hcl] Rash  . Penicillins Rash    Has patient had a PCN reaction causing immediate rash, facial/tongue/throat swelling, SOB or lightheadedness with hypotension: No Has patient had a PCN reaction causing severe rash involving mucus membranes or skin necrosis: No Has patient had a PCN reaction that required hospitalization No Has patient had a PCN reaction occurring within the last 10 years: No If all of the above answers are "NO", then may proceed with Cephalosporin use.     Patient Measurements: Height: 5' (152.4 cm) Weight: 108 lb (49 kg) IBW/kg (Calculated) : 45.5 Heparin Dosing Weight: 49 kg  Vital Signs: Temp: 98.5 F (36.9 C) (07/10 1813) Temp Source: Oral (07/10 1813) BP: 127/90 (07/10 2125) Pulse Rate: 101 (07/10 2125)  Labs:  Recent Labs  01/31/17 1816  HGB 10.6*  HCT 33.0*  PLT 327  CREATININE 0.44    Estimated Creatinine Clearance: 59.1 mL/min (by C-G formula based on SCr of 0.44 mg/dL).   Medical History: Past Medical History:  Diagnosis Date  . Anemia   . Anxiety   . Cancer ([place])    colon cancer stage 3  . Constipation   . Depression   . Fibromyalgia   . GERD (gastroesophageal reflux disease)   . JP drain bleeding     Medications:  Scheduled:  . heparin  2,000 Units Intravenous Once  . iopamidol       Infusions:  . heparin      Assessment: 22 yoF with hx of metastatic colon Ca now with + PE. IV heparin per Rx.   Today, 7/1 - Baseline coags elevated INR=2.24/PT=25.1 and aPtt=53 > Dr Blaine Hamper aware > will dose more conservatively and follow closely Goal of Therapy:  Heparin level 0.3-0.7 units/ml Monitor platelets by anticoagulation protocol: Yes   Plan:  Baseline aptt and PT/INR STAT Heparin 1000 unit bolus x1 Start drip at 800 units/hr Daily CBC/HL Check  1st HL in 6 hours  Dorrene German 02/01/2017,12:57 AM

## 2017-02-01 NOTE — Progress Notes (Signed)
Progress Note    [name]  EXH:[id number] DOB: 10-May-1966  DOA: 01/31/2017 PCP: Carol Ada, MD    Brief Narrative:   Chief complaint: Follow-up pulmonary embolism  Medical records reviewed and are as summarized below:  [name] is an 51 y.o. female with a PMH of fibromyalgia, stage III colon cancer status post colectomy/colostomy, GERD, depression/anxiety who was admitted 01/31/17 with a chief complaint of three-day history of chest pain associated with shortness of breath. In the ED, d-dimer was elevated and a subsequent CT angiogram showed a small nonocclusive embolus of the left lower lobe segment and subsegmental branch vessels.  Assessment/Plan:   Principal Problem:   PE (pulmonary thromboembolism) ([place]) Placed on IV heparin, which is being dosed conservatively given elevation of INR. Lower extremity Dopplers ordered to evaluate for DVT. 2-D echocardiogram ordered to evaluate for right heart strain.  Active Problems:   Cancer of right colon ([place])  S/P colectomy Continue Cobimetinib. Dr. Burr Medico contacted regarding the patient's admission with request for consultation regarding preference for anticoagulation.    Hyponatremia Osmolality studies pending.    Anxiety    Chronic pain/fibromyalgia Continue home medications including MS Contin, oxycodone, and when necessary tramadol.     HIV screening The patient falls between the ages of 13-64 and should be screened for HIV, however screening done 10/10/16: Nonreactive.    Protein calorie malnutrition/underweight Body mass index is 20.86 kg/m. Dietitian consultation requested.   Family Communication/Anticipated D/C date and plan/Code Status   DVT prophylaxis: Heparin ordered. Code Status: Full Code.  Family Communication: No family currently at the bedside. Disposition Plan: Home in 24 hours if stable.   Medical Consultants:    Oncology   Anti-Infectives:    None  Subjective:   Patient  reports that she is not currently short of breath. Appetite is fair. Abdomen chronically bloated.  Objective:    Vitals:   02/01/17 0300 02/01/17 0400 02/01/17 0451 02/01/17 0503  BP: (!) 121/92 (!) 126/96  (!) 129/91  Pulse: 98 97  93  Resp: 20 18  20   Temp:    97.9 F (36.6 C)  TempSrc:    Oral  SpO2: 98% 98%  100%  Weight:   48.4 kg (106 lb 12.8 oz)   Height:   5' (1.524 m)    No intake or output data in the 24 hours ending 02/01/17 0752 Filed Weights   01/31/17 1813 02/01/17 0451  Weight: 49 kg (108 lb) 48.4 kg (106 lb 12.8 oz)    Exam: General exam: Thin female, appears older than stated age. Respiratory system: Lungs diminished in the bases, clear. Cardiovascular system: Heart sounds mildly tachycardic. Cap refill < 2 sec. Gastrointestinal system: Abdomen is distended, colostomy present with yellow-brown soft stool. Central nervous system: Alert and oriented 3, nonfocal. Extremities: No clubbing, edema, or cyanosis. Skin: No rash. Porta-cath right upper chest wall without signs of infection. Psychiatry: Mood and affect  Data Reviewed:   I have personally reviewed following labs and imaging studies:  Labs: Labs show the following: Sodium 126, chloride 96, chemistries otherwise unremarkable. LFTs not checked. INR 2.24. WBC 12.8, hemoglobin 10.6, troponin less than 0.03.  Procedures and diagnostic studies:  Dg Chest 2 View 01/31/2017: Lungs clear. No acute pulmonary process.   Ct Angio Chest Pe W And/or Wo Contrast 02/01/2017: Left lower lobe segmental/subsegmental pulmonary embolism. Multiple bilateral pulmonary nodules. Peritoneal carcinomatosis. Dilated bowel and gallbladder.    Medications:   . clindamycin   Topical BID  .  cobimetinib fumarate  60 mg Oral Daily  . iopamidol      . morphine  30 mg Oral QHS  . oxyCODONE  10 mg Oral Q4H  . sodium chloride flush  10-40 mL Intracatheter Q12H  . sodium chloride flush  3 mL Intravenous Q12H  . triamcinolone  ointment  1 application Topical BID   Continuous Infusions: . heparin 800 Units/hr (02/01/17 0247)    Medical decision making is of high complexity and this patient is at high risk of deterioration, therefore this is a level 3 visit.  (> 4 problem points, >4 data points, high risk: Need 2 out of 3)   Problems/DDx Points   Self limited or minor (max 2)       1   Established problem, stable       1   Established problem, worsening: Metastatic colon cancer        2   2  New problem, no additional W/U planned (max 1)       3   New problem, additional W/U planned : 2-D echo        4  4   Data Reviewed Points   Review/order clinical lab tests       1  1  Review/order x-rays       1   Review/order tests (Echo, EKG, PFTs, etc)       1   Discussion of test results w/ performing MD       1   Independent review of image, tracing or specimen       2  2  Decision to obtain old records       1   Review and summation of old records       2  2   Level of risk Presenting prob Diagnostics Management   Minimal 1 self limited/minor Labs CXR EKG/EEG U/A U/S Rest Gargles Bandages Dressings   Low 2 or more self limited/minor 1 stable chronic Acute uncomplicated illness Tests (PFTS) Non-CV imaging Arterial labs Biopsies of skin OTC drugs Minor surgery-no risk PT OT IVF without additives    Moderate 1 or more chronic illnesses w/ mild exac, progression or S/E from tx 2 or more stable chronic illnesses Undiagnosed new problem w/ uncertain prognosis Acute complicated injury  Stress tests Endoscopies with no risk factors Deep needle or incisional bx CV imaging without risk LP Thoracentesis Paracentesis Minor surgery w/ risks Elective major surgery w/ no risk (open, percutaneous or endoscopic) Prescription drugs Therapeutic nucl med IVF with additives Closed tx of fracture/dislocation    High Severe exac of chronic illness Acute or chronic illness/injury may pose a threat to life or  bodily function (ARF) Change in neuro status    CV imaging w/ contrast and risk Cardio electophysiologic tests Endoscopies w/ risk Discography Elective major surgery Emergency major surgery Parenteral controlled substances Drug therapy req monitoring for toxicity DNR/de-escalation of care    MDM Prob points Data points Risk   Straightforward    <1    <1    Min   Low complexity    2    2    Low   Moderate    3    3    Mod   High Complexity    4 or more    4 or more    High      LOS: 0 days   RAMA,CHRISTINA  [facility] Pager [phone]. If unable to reach me by  pager, please call my cell phone at [phone].  *Please refer to [email or web address], password [email or web address] to get updated schedule on who will round on this patient, as hospitalists switch teams weekly. If 7PM-7AM, please contact night-coverage at [email or web address], password [email or web address] for any overnight needs.  02/01/2017, 7:52 AM

## 2017-02-02 ENCOUNTER — Inpatient Hospital Stay (HOSPITAL_COMMUNITY): Payer: 59

## 2017-02-02 ENCOUNTER — Encounter (HOSPITAL_COMMUNITY): Payer: 59

## 2017-02-02 DIAGNOSIS — Z9049 Acquired absence of other specified parts of digestive tract: Secondary | ICD-10-CM

## 2017-02-02 DIAGNOSIS — C182 Malignant neoplasm of ascending colon: Secondary | ICD-10-CM

## 2017-02-02 DIAGNOSIS — E871 Hypo-osmolality and hyponatremia: Secondary | ICD-10-CM

## 2017-02-02 DIAGNOSIS — F419 Anxiety disorder, unspecified: Secondary | ICD-10-CM

## 2017-02-02 LAB — CBC
HEMATOCRIT: 28.9 % — AB (ref 36.0–46.0)
HEMOGLOBIN: 9.2 g/dL — AB (ref 12.0–15.0)
MCH: 25.3 pg — ABNORMAL LOW (ref 26.0–34.0)
MCHC: 31.8 g/dL (ref 30.0–36.0)
MCV: 79.4 fL (ref 78.0–100.0)
Platelets: 303 10*3/uL (ref 150–400)
RBC: 3.64 MIL/uL — ABNORMAL LOW (ref 3.87–5.11)
RDW: 18.3 % — ABNORMAL HIGH (ref 11.5–15.5)
WBC: 12.7 10*3/uL — AB (ref 4.0–10.5)

## 2017-02-02 LAB — GLUCOSE, CAPILLARY: GLUCOSE-CAPILLARY: 91 mg/dL (ref 65–99)

## 2017-02-02 LAB — HEPARIN LEVEL (UNFRACTIONATED): Heparin Unfractionated: 0.45 IU/mL (ref 0.30–0.70)

## 2017-02-02 MED ORDER — RIVAROXABAN (XARELTO) VTE STARTER PACK (15 & 20 MG): ORAL_TABLET | ORAL | 0 refills | Status: DC

## 2017-02-02 MED ORDER — RIVAROXABAN 15 MG PO TABS
15.0000 mg | ORAL_TABLET | Freq: Two times a day (BID) | ORAL | Status: DC
  Administered 2017-02-02: 15 mg via ORAL
  Filled 2017-02-02: qty 1

## 2017-02-02 MED ORDER — RIVAROXABAN (XARELTO) EDUCATION KIT FOR DVT/PE PATIENTS
PACK | Freq: Once | Status: AC
  Administered 2017-02-02: 10:00:00
  Filled 2017-02-02: qty 1

## 2017-02-02 MED ORDER — HEPARIN SOD (PORK) LOCK FLUSH 100 UNIT/ML IV SOLN
500.0000 [IU] | INTRAVENOUS | Status: AC | PRN
  Administered 2017-02-02: 500 [IU]

## 2017-02-02 NOTE — Discharge Instructions (Addendum)
Pulmonary Embolism A pulmonary embolism (PE) is a sudden blockage or decrease of blood flow in one lung or both lungs. Most blockages come from a blood clot that travels from the legs or the pelvis to the lungs. PE is a dangerous and potentially life-threatening condition if it is not treated right away. What are the causes? A pulmonary embolism occurs most commonly when a blood clot travels from one of your veins to your lungs. Rarely, PE is caused by air, fat, amniotic fluid, or part of a tumor traveling through your veins to your lungs. What increases the risk? A PE is more likely to develop in:  People who smoke.  People who areolder, especially over 42 years of age.  People who are overweight (obese).  People who sit or lie still for a long time, such as during long-distance travel (over 4 hours), bed rest, hospitalization, or during recovery from certain medical conditions like a stroke.  People who do not engage in much physical activity (sedentary lifestyle).  People who have chronic breathing disorders.  People whohave a personal or family history of blood clots or blood clotting disease.  People whohave peripheral vascular disease (PVD), diabetes, or some types of cancer.  People who haveheart disease, especially if the person had a recent heart attack or has congestive heart failure.  People who have neurological diseases that affect the legs (leg paresis).  People who have had a traumatic injury, such as breaking a hip or leg.  People whohave recently had major or lengthy surgery, especially on the hip, knee, or abdomen.  People who have hada central line placed inside a large vein.  People who takemedicines that contain the hormone estrogen. These include birth control pills and hormone replacement therapy.  Pregnancy or during childbirth or the postpartum period.  What are the signs or symptoms? The symptoms of a PE usually start suddenly and  include:  Shortness of breath while active or at rest.  Coughing or coughing up blood or blood-tinged mucus.  Chest pain that is often worse with deep breaths.  Rapid or irregular heartbeat.  Feeling light-headed or dizzy.  Fainting.  Feelinganxious.  Sweating.  There may also be pain and swelling in a leg if that is where the blood clot started. These symptoms may represent a serious problem that is an emergency. Do not wait to see if the symptoms will go away. Get medical help right away. Call your local emergency services (911 in the [place] Do not drive yourself to the hospital. How is this diagnosed? Your health care provider will take a medical history and perform a physical exam. You may also have other tests, including:  Blood tests to assess the clotting properties of your blood, assess oxygen levels in your blood, and find blood clots.  Imaging tests, such as CT, ultrasound, MRI, X-ray, and other tests to see if you have clots anywhere in your body.  An electrocardiogram (ECG) to look for heart strain from blood clots in the lungs.  How is this treated? The main goals of PE treatment are:  To stop a blood clot from growing larger.  To stop new blood clots from forming.  The type of treatment that you receive depends on many factors, such as the cause of your PE, your risk for bleeding or developing more clots, and other medical conditions that you have. Sometimes, a combination of treatments is necessary. This condition may be treated with:  Medicines, including newer oral blood  thinners (anticoagulants), warfarin, low molecular weight heparins, thrombolytics, or heparins.  Wearing compression stockings or using different types of devices.  Surgery (rare) to remove the blood clot or to place a filter in your abdomen to stop the blood clot from traveling to your lungs.  Treatments for a PE are often divided into immediate treatment, long-term treatment (up to 3  months after PE), and extended treatment (more than 3 months after PE). Your treatment may continue for several months. This is called maintenance therapy, and it is used to prevent the forming of new blood clots. You can work with your health care provider to choose the treatment program that is best for you. What are anticoagulants? Anticoagulants are medicines that treat PEs. They can stop current blood clots from growing and stop new clots from forming. They cannot dissolve existing clots. Your body dissolves clots by itself over time. Anticoagulants are given by mouth, by injection, or through an IV tube. What are thrombolytics? Thrombolytics are clot-dissolving medicines that are used to dissolve a PE. They carry a high risk of bleeding, so they tend to be used only in severe cases or if you have very low blood pressure. Follow these instructions at home: If you are taking a newer oral anticoagulant:  Take the medicine every single day at the same time each day.  Understand what foods and drugs interact with this medicine.  Understand that there are no regular blood tests required when using this medicine.  Understandthe side effects of this medicine, including excessive bruising or bleeding. Ask your health care provider or pharmacist about other possible side effects. If you are taking warfarin:  Understand how to take warfarin and know which foods can affect how warfarin works in Veterinary surgeon.  Understand that it is dangerous to taketoo much or too little warfarin. Too much warfarin increases the risk of bleeding. Too little warfarin continues to allow the risk for blood clots.  Follow your PT and INR blood testing schedule. The PT and INR results allow your health care provider to adjust your dose of warfarin. It is very important that you have your PT and INR tested as often as told by your health care provider.  Avoid major changes in your diet, or tell your health care provider  before you change your diet. Arrange a visit with a registered dietitian to answer your questions. Many foods, especially foods that are high in vitamin K, can interfere with warfarin and affect the PT and INR results. Eat a consistent amount of foods that are high in vitamin K, such as: ? Spinach, kale, broccoli, cabbage, collard greens, turnip greens, Brussels sprouts, peas, cauliflower, seaweed, and parsley. ? Beef liver and pork liver. ? Green tea. ? Soybean oil.  Tell your health care provider about any and all medicines, vitamins, and supplements that you take, including aspirin and other over-the-counter anti-inflammatory medicines. Be especially cautious with aspirin and anti-inflammatory medicines. Do not take those before you ask your health care provider if it is safe to do so. This is important because many medicines can interfere with warfarin and affect the PT and INR results.  Do not start or stop taking any over-the-counter or prescription medicine unless your health care provider or pharmacist tells you to do so. If you take warfarin, you will also need to do these things:  Hold pressure over cuts for longer than usual.  Tell your dentist and other health care providers that you are taking warfarin before you  have any procedures in which bleeding may occur.  Avoid alcohol or drink very small amounts. Tell your health care provider if you change your alcohol intake.  Do not use tobacco products, including cigarettes, chewing tobacco, and e-cigarettes. If you need help quitting, ask your health care provider.  Avoid contact sports.  General instructions  Take over-the-counter and prescription medicines only as told by your health care provider. Anticoagulant medicines can have side effects, including easy bruising and difficulty stopping bleeding. If you are prescribed an anticoagulant, you will also need to do these things: ? Hold pressure over cuts for longer than  usual. ? Tell your dentist and other health care providers that you are taking anticoagulants before you have any procedures in which bleeding may occur. ? Avoid contact sports.  Wear a medical alert bracelet or carry a medical alert card that says you have had a PE.  Ask your health care provider how soon you can go back to your normal activities. Stay active to prevent new blood clots from forming.  Make sure to exercise while traveling or when you have been sitting or standing for a long period of time. It is very important to exercise. Exercise your legs by walking or by tightening and relaxing your leg muscles often. Take frequent walks.  Wear compression stockings as told by your health care provider to help prevent more blood clots from forming.  Do not use tobacco products, including cigarettes, chewing tobacco, and e-cigarettes. If you need help quitting, ask your health care provider.  Keep all follow-up appointments with your health care provider. This is important. How is this prevented? Take these actions to decrease your risk of developing another PE:  Exercise regularly. For at least 30 minutes every day, engage in: ? Activity that involves moving your arms and legs. ? Activity that encourages good blood flow through your body by increasing your heart rate.  Exercise your arms and legs every hour during long-distance travel (over 4 hours). Drink plenty of water and avoid drinking alcohol while traveling.  Avoid sitting or lying in bed for long periods of time without moving your legs.  Maintain a weight that is appropriate for your height. Ask your health care provider what weight is healthy for you.  If you are a woman who is over 63 years of age, avoid unnecessary use of medicines that contain estrogen. These include birth control pills.  Do not smoke, especially if you take estrogen medicines. If you need help quitting, ask your health care provider.  If you are at  very high risk for PE, wear compression stockings.  If you recently had a PE, have regularly scheduled ultrasound testing on your legs to check for new blood clots.  If you are hospitalized, prevention measures may include:  Early walking after surgery, as soon as your health care provider says that it is safe.  Receiving anticoagulants to prevent blood clots. If you cannot take anticoagulants, other options may be available, such as wearing compression stockings or using different types of devices.  Get help right away if:  You have new or increased pain, swelling, or redness in an arm or leg.  You have numbness or tingling in an arm or leg.  You have shortness of breath while active or at rest.  You have chest pain.  You have a rapid or irregular heartbeat.  You feel light-headed or dizzy.  You cough up blood.  You notice blood in your vomit, bowel movement,  or urine.  You have a fever. These symptoms may represent a serious problem that is an emergency. Do not wait to see if the symptoms will go away. Get medical help right away. Call your local emergency services (911 in the [place] Do not drive yourself to the hospital. This information is not intended to replace advice given to you by your health care provider. Make sure you discuss any questions you have with your health care provider. Document Released: 07/08/2000 Document Revised: 12/17/2015 Document Reviewed: 11/05/2014 Elsevier Interactive Patient Education  2017 [place] on my medicine - XARELTO (rivaroxaban)  This medication education was reviewed with me or my healthcare representative as part of my discharge preparation.  The pharmacist that spoke with me during my hospital stay was:  Eudelia Bunch, [place]? Xarelto was prescribed to treat blood clots that may have been found in the veins of your legs (deep vein thrombosis) or in your lungs (pulmonary  embolism) and to reduce the risk of them occurring again.  What do you need to know about Xarelto? The starting dose is one 15 mg tablet taken TWICE daily with food for the FIRST 21 DAYS then after all 15 mg tablets are taken ~ August 2nd the dose is changed to one 20 mg tablet taken ONCE A DAY with your evening meal.  DO NOT stop taking Xarelto without talking to the health care provider who prescribed the medication.  Refill your prescription for 20 mg tablets before you run out.  After discharge, you should have regular check-up appointments with your healthcare provider that is prescribing your Xarelto.  In the future your dose may need to be changed if your kidney function changes by a significant amount.  What do you do if you miss a dose? If you are taking Xarelto TWICE DAILY and you miss a dose, take it as soon as you remember. You may take two 15 mg tablets (total 30 mg) at the same time then resume your regularly scheduled 15 mg twice daily the next day.  If you are taking Xarelto ONCE DAILY and you miss a dose, take it as soon as you remember on the same day then continue your regularly scheduled once daily regimen the next day. Do not take two doses of Xarelto at the same time.   Important Safety Information Xarelto is a blood thinner medicine that can cause bleeding. You should call your healthcare provider right away if you experience any of the following: ? Bleeding from an injury or your nose that does not stop. ? Unusual colored urine (red or dark brown) or unusual colored stools (red or black). ? Unusual bruising for unknown reasons. ? A serious fall or if you hit your head (even if there is no bleeding).  Some medicines may interact with Xarelto and might increase your risk of bleeding while on Xarelto. To help avoid this, consult your healthcare provider or pharmacist prior to using any new prescription or non-prescription medications, including herbals, vitamins,  non-steroidal anti-inflammatory drugs (NSAIDs) and supplements.  This website has more information on Xarelto: [email or web address]

## 2017-02-02 NOTE — Progress Notes (Signed)
ANTICOAGULATION CONSULT NOTE - Follow Up  Pharmacy Consult for IV heparin Indication: pulmonary embolus  Allergies  Allergen Reactions  . Latex Hives  . Cymbalta [Duloxetine Hcl] Rash  . Penicillins Rash    Has patient had a PCN reaction causing immediate rash, facial/tongue/throat swelling, SOB or lightheadedness with hypotension: No Has patient had a PCN reaction causing severe rash involving mucus membranes or skin necrosis: No Has patient had a PCN reaction that required hospitalization No Has patient had a PCN reaction occurring within the last 10 years: No If all of the above answers are "NO", then may proceed with Cephalosporin use.     Patient Measurements: Height: 5' (152.4 cm) Weight: 106 lb 12.8 oz (48.4 kg) IBW/kg (Calculated) : 45.5 Heparin Dosing Weight: 49 kg  Vital Signs: Temp: 98.3 F (36.8 C) (07/11 2127) Temp Source: Oral (07/11 2127) BP: 117/83 (07/11 2127) Pulse Rate: 96 (07/11 2127)  Labs:  Recent Labs  01/31/17 1816 02/01/17 0114 02/01/17 0248 02/01/17 0750 02/01/17 1517 02/01/17 1841 02/02/17 0405  HGB 10.6*  --   --  10.0*  --   --  9.2*  HCT 33.0*  --   --  30.9*  --   --  28.9*  PLT 327  --   --  309  --   --  303  APTT  --  53*  --   --   --   --   --   LABPROT  --  25.1*  --   --   --  23.1*  --   INR  --  2.24  --   --   --  2.01  --   HEPARINUNFRC  --   --   --  <0.10*  --  0.21* 0.45  CREATININE 0.44  --   --  0.43*  --   --   --   TROPONINI  --   --  <0.03 <0.03 <0.03  --   --     Estimated Creatinine Clearance: 59.1 mL/min (A) (by C-G formula based on SCr of 0.43 mg/dL (L)).   Medical History: Past Medical History:  Diagnosis Date  . Anemia   . Anxiety   . Cancer ([place])    colon cancer stage 3  . Constipation   . Depression   . Fibromyalgia   . GERD (gastroesophageal reflux disease)   . JP drain bleeding     Medications:  Scheduled:  . clindamycin   Topical BID  . cobimetinib fumarate  60 mg Oral Daily  .  morphine  30 mg Oral QHS  . oxyCODONE  10 mg Oral Q4H  . sodium chloride flush  10-40 mL Intracatheter Q12H  . sodium chloride flush  3 mL Intravenous Q12H  . triamcinolone ointment  1 application Topical BID   Infusions:  . heparin 1,100 Units/hr (02/02/17 0145)    Assessment: 22 yoF with hx of metastatic colon Ca now with + PE. IV heparin per Rx.    - Baseline coags elevated INR=2.24/PT=25.1 and aPtt=53 > Dr Blaine Hamper aware and ordered to proceed with IV heparin > will dose more conservatively and follow closely  7/11  SUBtherapeutic heparin level (= 0.21) following rate increase earlier today to 1000 units/hr  No issues reported per RN  Labs being drawn via PAC since refusing peripheral sticks (heparin infusing via PAC)  INR recheck, INR trending down 2.01 (not on warfarin)  CBC: Hgb low but stable, Plts wnl  No bleeding reported  No  problems with IV site noted per RN  Today, 7/12  0405 HL=0.45 at goal, no problems reported per RN  Goal of Therapy:  Heparin level 0.3-0.5 units/ml, dose conservatively per MD note d/t elevated INR Monitor platelets by anticoagulation protocol: Yes   Plan:   Continue heparin drip at 1100 units/hr  Recheck HL today to ensure stays in range (will recheck INR also)  Daily heparin level and CBC    Dorrene German 02/02/2017 5:03 AM

## 2017-02-02 NOTE — Discharge Summary (Signed)
Physician Discharge Summary  [name] TJQ:[id number] DOB: [birth year] DOA: 01/31/2017  PCP: Carol Ada, MD  Admit date: 01/31/2017 Discharge date: 02/02/2017  Admitted From: Home Discharge disposition: Home   Recommendations for Outpatient Follow-Up:   1. Has F/U scheduled with Dr. Burr Medico.   Discharge Diagnosis:   Principal Problem:    PE (pulmonary thromboembolism) ([place]) Active Problems:    S/P colectomy    Cancer of right colon ([facility])    Hyponatremia    Anxiety    Protein-calorie malnutrition, severe ([place])    Pulmonary embolism ([place])  Discharge Condition: Improved.  Diet recommendation: Regular.   History of Present Illness:   [name] is an 51 y.o. female with a PMH of fibromyalgia, stage III colon cancer status post colectomy/colostomy, GERD, depression/anxiety who was admitted 01/31/17 with a chief complaint of three-day history of chest pain associated with shortness of breath. In the ED, d-dimer was elevated and a subsequent CT angiogram showed a small nonocclusive embolus of the left lower lobe segment and subsegmental branch vessels.  Hospital Course by Problem:   Principal Problem:   PE (pulmonary thromboembolism) ([place]) Placed on IV heparin on admission, switched to Xarelto at discharge per Dr. Ernestina Penna recommendations. Cancelled 2 D Echo (small PE) and LE dopplers to R/O DVT as it will not change management.  Active Problems:   Cancer of right colon ([place])  S/P colectomy Continue Cobimetinib. F/U with Dr. Burr Medico.    Hyponatremia Improved. Sodium 130 at discharge.    Anxiety    Chronic pain/fibromyalgia Continue home medications including MS Contin, oxycodone, and when necessary tramadol.     HIV screening The patient falls between the ages of 13-64 and should be screened for HIV, however screening done 10/10/16: Nonreactive.    Protein calorie malnutrition/underweight Body mass index is 20.86 kg/m. Dietitian consultation  requested.  Medical Consultants:    Oncology   Discharge Exam:   Vitals:   02/01/17 2127 02/02/17 0511  BP: 117/83 110/74  Pulse: 96 (!) 103  Resp: 18 18  Temp: 98.3 F (36.8 C) 98 F (36.7 C)   Vitals:   02/01/17 0503 02/01/17 1300 02/01/17 2127 02/02/17 0511  BP: (!) 129/91 (!) 121/93 117/83 110/74  Pulse: 93 100 96 (!) 103  Resp: 20 18 18 18   Temp: 97.9 F (36.6 C) 97.7 F (36.5 C) 98.3 F (36.8 C) 98 F (36.7 C)  TempSrc: Oral Oral Oral Oral  SpO2: 100% 100% 98% 100%  Weight:      Height:        General exam: Appears calm and comfortable.  Respiratory system: Clear to auscultation. Respiratory effort normal. Cardiovascular system: S1 & S2 heard, tachycardic. No JVD,  rubs, gallops or clicks. II/VI systolic murmur. Gastrointestinal system: Abdomen is distended, firm. A colostomy is present.  Central nervous system: Alert and oriented. No focal neurological deficits. Extremities: No clubbing,  or cyanosis. No edema. Skin: No rashes, lesions or ulcers. Porta-cath present. Psychiatry: Judgement and insight appear normal. Mood & affect appropriate.   The results of significant diagnostics from this hospitalization (including imaging, microbiology, ancillary and laboratory) are listed below for reference.     Procedures and Diagnostic Studies:   Dg Chest 2 View 01/31/2017: Lungs clear. No acute pulmonary process.   Ct Angio Chest Pe W And/or Wo Contrast 02/01/2017: Left lower lobe segmental/subsegmental pulmonary embolism. Multiple bilateral pulmonary nodules. Peritoneal carcinomatosis. Dilated bowel and gallbladder.     Labs:   Basic Metabolic Panel:  Recent Labs Lab  01/31/17 1816 02/01/17 0750  NA 126* 130*  K 4.0 3.8  CL 96* 99*  CO2 22 23  GLUCOSE 91 79  BUN 16 12  CREATININE 0.44 0.43*  CALCIUM 7.9* 7.6*   GFR Estimated Creatinine Clearance: 59.1 mL/min (A) (by C-G formula based on SCr of 0.43 mg/dL (L)). Liver Function Tests:  Recent  Labs Lab 02/01/17 0750  AST 17  ALT 14  ALKPHOS 158*  BILITOT 0.8  PROT 5.3*  ALBUMIN 2.0*   Coagulation profile  Recent Labs Lab 02/01/17 0114 02/01/17 1841  INR 2.24 2.01    CBC:  Recent Labs Lab 01/31/17 1816 02/01/17 0750 02/02/17 0405  WBC 12.8* 9.6 12.7*  NEUTROABS 9.3*  --   --   HGB 10.6* 10.0* 9.2*  HCT 33.0* 30.9* 28.9*  MCV 78.2 78.8 79.4  PLT 327 309 303   Cardiac Enzymes:  Recent Labs Lab 02/01/17 0248 02/01/17 0750 02/01/17 1517  TROPONINI <0.03 <0.03 <0.03   CBG:  Recent Labs Lab 02/01/17 0740 02/02/17 0739  GLUCAP 79 91   D-Dimer  Recent Labs  01/31/17 2012  DDIMER 1.56*   Thyroid function studies  Recent Labs  02/01/17 0750  TSH 1.307     Discharge Instructions:   Discharge Instructions    Call MD for:  difficulty breathing, headache or visual disturbances    Complete by:  As directed    Call MD for:  extreme fatigue    Complete by:  As directed    Call MD for:  persistant dizziness or light-headedness    Complete by:  As directed    Call MD for:  persistant nausea and vomiting    Complete by:  As directed    Diet general    Complete by:  As directed    Increase activity slowly    Complete by:  As directed      Allergies as of 02/02/2017      Reactions   Latex Hives   Cymbalta [duloxetine Hcl] Rash   Penicillins Rash   Has patient had a PCN reaction causing immediate rash, facial/tongue/throat swelling, SOB or lightheadedness with hypotension: No Has patient had a PCN reaction causing severe rash involving mucus membranes or skin necrosis: No Has patient had a PCN reaction that required hospitalization No Has patient had a PCN reaction occurring within the last 10 years: No If all of the above answers are "NO", then may proceed with Cephalosporin use.      Medication List    TAKE these medications   ALPRAZolam 0.25 MG tablet Commonly known as:  XANAX Take 1-2 tablets (0.25-0.5 mg total) by mouth at  bedtime as needed for anxiety or sleep.   clindamycin 1 % gel Commonly known as:  CLINDAGEL Apply topically 2 (two) times daily.   cobimetinib fumarate 20 MG tablet Commonly known as:  COTELLIC Take 3 tablets (60 mg total) by mouth daily. Take on days 1-21. Repeat every 28 days. Avoid sun exposure.   cyclobenzaprine 10 MG tablet Commonly known as:  FLEXERIL Take 1 tablet (10 mg total) by mouth 3 (three) times daily as needed for muscle spasms.   lidocaine-prilocaine cream Commonly known as:  EMLA Apply 1 application topically as needed.   morphine 30 MG 12 hr tablet Commonly known as:  MS CONTIN Take 1 tablet (30 mg total) by mouth every 12 (twelve) hours. What changed:  when to take this   Oxycodone HCl 10 MG Tabs Take 1 tablet (10 mg total)  by mouth every 4 (four) hours.   prochlorperazine 10 MG tablet Commonly known as:  COMPAZINE Take 1 tablet (10 mg total) by mouth every 6 (six) hours as needed for nausea or vomiting.   Rivaroxaban 15 & 20 MG Tbpk Take as directed: Start with one 15mg  tablet by mouth twice a day with food. On Day 22, switch to one 20mg  tablet once a day with food.   traMADol 50 MG tablet Commonly known as:  ULTRAM Take 1-2 tablets (50-100 mg total) by mouth every 6 (six) hours as needed. What changed:  reasons to take this   triamcinolone ointment 0.5 % Commonly known as:  KENALOG Apply 1 application topically 2 (two) times daily.         Time coordinating discharge: 35 minutes.  Signed:  RAMA,CHRISTINA  Pager [phone] [facility] 02/02/2017, 9:59 AM

## 2017-02-02 NOTE — Progress Notes (Signed)
ANTICOAGULATION CONSULT NOTE - Follow Up  Pharmacy Consult for IV heparin>>xarelto Indication: pulmonary embolus  Allergies  Allergen Reactions  . Latex Hives  . Cymbalta [Duloxetine Hcl] Rash  . Penicillins Rash    Has patient had a PCN reaction causing immediate rash, facial/tongue/throat swelling, SOB or lightheadedness with hypotension: No Has patient had a PCN reaction causing severe rash involving mucus membranes or skin necrosis: No Has patient had a PCN reaction that required hospitalization No Has patient had a PCN reaction occurring within the last 10 years: No If all of the above answers are "NO", then may proceed with Cephalosporin use.     Patient Measurements: Height: 5' (152.4 cm) Weight: 106 lb 12.8 oz (48.4 kg) IBW/kg (Calculated) : 45.5 Heparin Dosing Weight: 49 kg  Vital Signs: Temp: 98 F (36.7 C) (07/12 0511) Temp Source: Oral (07/12 0511) BP: 110/74 (07/12 0511) Pulse Rate: 103 (07/12 0511)  Labs:  Recent Labs  01/31/17 1816 02/01/17 0114 02/01/17 0248 02/01/17 0750 02/01/17 1517 02/01/17 1841 02/02/17 0405  HGB 10.6*  --   --  10.0*  --   --  9.2*  HCT 33.0*  --   --  30.9*  --   --  28.9*  PLT 327  --   --  309  --   --  303  APTT  --  53*  --   --   --   --   --   LABPROT  --  25.1*  --   --   --  23.1*  --   INR  --  2.24  --   --   --  2.01  --   HEPARINUNFRC  --   --   --  <0.10*  --  0.21* 0.45  CREATININE 0.44  --   --  0.43*  --   --   --   TROPONINI  --   --  <0.03 <0.03 <0.03  --   --     Estimated Creatinine Clearance: 59.1 mL/min (A) (by C-G formula based on SCr of 0.43 mg/dL (L)).   Assessment: 10 yoF with hx of metastatic colon Ca now with + PE. IV heparin per Rx.    - Baseline coags elevated INR=2.24/PT=25.1 and aPtt=53 > Dr Blaine Hamper aware and ordered to proceed with IV heparin > will dose more conservatively and follow closely  7/11  Labs being drawn via [place] since refusing peripheral sticks (heparin infusing via  PAC)  INR recheck, INR trending down 2.01 (not on warfarin)  CBC: Hgb low but stable, Plts wnl  No bleeding reported  No problems with IV site noted per RN  Today, 7/12  0405 HL=0.45 at goal, no problems reported per RN  To transition to xarelto for PE treatment, Hg 9.2, plct WNL, no bleeding reported   Plan:  Dc heparin drip and start xarelto 15 mg po BID x 21 days then xarelto 20 mg qsupper xarelto DC kit with 30 day free card ordered for pt  Eudelia Bunch, Pharm.D. [phone] 02/02/2017 9:49 AM

## 2017-02-06 ENCOUNTER — Inpatient Hospital Stay (HOSPITAL_COMMUNITY): Payer: 59

## 2017-02-06 ENCOUNTER — Inpatient Hospital Stay (HOSPITAL_COMMUNITY)
Admission: EM | Admit: 2017-02-06 | Discharge: 2017-02-12 | DRG: 377 | Disposition: A | Discharge status: Hospice | Payer: 59 | Attending: Internal Medicine | Admitting: Internal Medicine

## 2017-02-06 ENCOUNTER — Other Ambulatory Visit: Payer: 59

## 2017-02-06 ENCOUNTER — Ambulatory Visit: Payer: 59 | Admitting: Hematology

## 2017-02-06 ENCOUNTER — Ambulatory Visit: Payer: 59

## 2017-02-06 ENCOUNTER — Encounter (HOSPITAL_COMMUNITY): Payer: 59

## 2017-02-06 ENCOUNTER — Encounter (HOSPITAL_COMMUNITY): Payer: Self-pay | Admitting: Emergency Medicine

## 2017-02-06 DIAGNOSIS — K9401 Colostomy hemorrhage: Secondary | ICD-10-CM | POA: Diagnosis present

## 2017-02-06 DIAGNOSIS — K9409 Other complications of colostomy: Secondary | ICD-10-CM | POA: Diagnosis present

## 2017-02-06 DIAGNOSIS — Z515 Encounter for palliative care: Secondary | ICD-10-CM | POA: Diagnosis present

## 2017-02-06 DIAGNOSIS — E875 Hyperkalemia: Secondary | ICD-10-CM | POA: Diagnosis present

## 2017-02-06 DIAGNOSIS — K435 Parastomal hernia without obstruction or  gangrene: Secondary | ICD-10-CM | POA: Diagnosis present

## 2017-02-06 DIAGNOSIS — K56609 Unspecified intestinal obstruction, unspecified as to partial versus complete obstruction: Secondary | ICD-10-CM | POA: Diagnosis present

## 2017-02-06 DIAGNOSIS — M791 Myalgia: Secondary | ICD-10-CM

## 2017-02-06 DIAGNOSIS — G8929 Other chronic pain: Secondary | ICD-10-CM | POA: Diagnosis not present

## 2017-02-06 DIAGNOSIS — C786 Secondary malignant neoplasm of retroperitoneum and peritoneum: Secondary | ICD-10-CM | POA: Diagnosis present

## 2017-02-06 DIAGNOSIS — Z880 Allergy status to penicillin: Secondary | ICD-10-CM

## 2017-02-06 DIAGNOSIS — Z9049 Acquired absence of other specified parts of digestive tract: Secondary | ICD-10-CM

## 2017-02-06 DIAGNOSIS — Z66 Do not resuscitate: Secondary | ICD-10-CM | POA: Diagnosis present

## 2017-02-06 DIAGNOSIS — Z9884 Bariatric surgery status: Secondary | ICD-10-CM

## 2017-02-06 DIAGNOSIS — F329 Major depressive disorder, single episode, unspecified: Secondary | ICD-10-CM | POA: Diagnosis present

## 2017-02-06 DIAGNOSIS — R601 Generalized edema: Secondary | ICD-10-CM | POA: Diagnosis present

## 2017-02-06 DIAGNOSIS — I2699 Other pulmonary embolism without acute cor pulmonale: Secondary | ICD-10-CM | POA: Diagnosis present

## 2017-02-06 DIAGNOSIS — R64 Cachexia: Secondary | ICD-10-CM | POA: Diagnosis present

## 2017-02-06 DIAGNOSIS — Z7901 Long term (current) use of anticoagulants: Secondary | ICD-10-CM

## 2017-02-06 DIAGNOSIS — D509 Iron deficiency anemia, unspecified: Secondary | ICD-10-CM | POA: Diagnosis present

## 2017-02-06 DIAGNOSIS — Z79899 Other long term (current) drug therapy: Secondary | ICD-10-CM

## 2017-02-06 DIAGNOSIS — T45515A Adverse effect of anticoagulants, initial encounter: Secondary | ICD-10-CM | POA: Diagnosis present

## 2017-02-06 DIAGNOSIS — Z79891 Long term (current) use of opiate analgesic: Secondary | ICD-10-CM

## 2017-02-06 DIAGNOSIS — Z888 Allergy status to other drugs, medicaments and biological substances status: Secondary | ICD-10-CM

## 2017-02-06 DIAGNOSIS — F419 Anxiety disorder, unspecified: Secondary | ICD-10-CM | POA: Diagnosis present

## 2017-02-06 DIAGNOSIS — C787 Secondary malignant neoplasm of liver and intrahepatic bile duct: Secondary | ICD-10-CM | POA: Diagnosis present

## 2017-02-06 DIAGNOSIS — E43 Unspecified severe protein-calorie malnutrition: Secondary | ICD-10-CM | POA: Diagnosis present

## 2017-02-06 DIAGNOSIS — E871 Hypo-osmolality and hyponatremia: Secondary | ICD-10-CM | POA: Diagnosis present

## 2017-02-06 DIAGNOSIS — K5900 Constipation, unspecified: Secondary | ICD-10-CM | POA: Diagnosis present

## 2017-02-06 DIAGNOSIS — Z7189 Other specified counseling: Secondary | ICD-10-CM

## 2017-02-06 DIAGNOSIS — Z91040 Latex allergy status: Secondary | ICD-10-CM

## 2017-02-06 DIAGNOSIS — K922 Gastrointestinal hemorrhage, unspecified: Principal | ICD-10-CM

## 2017-02-06 DIAGNOSIS — C189 Malignant neoplasm of colon, unspecified: Secondary | ICD-10-CM | POA: Diagnosis not present

## 2017-02-06 DIAGNOSIS — I959 Hypotension, unspecified: Secondary | ICD-10-CM | POA: Diagnosis not present

## 2017-02-06 DIAGNOSIS — M797 Fibromyalgia: Secondary | ICD-10-CM | POA: Diagnosis present

## 2017-02-06 DIAGNOSIS — R1084 Generalized abdominal pain: Secondary | ICD-10-CM

## 2017-02-06 DIAGNOSIS — E860 Dehydration: Secondary | ICD-10-CM | POA: Diagnosis present

## 2017-02-06 DIAGNOSIS — C182 Malignant neoplasm of ascending colon: Secondary | ICD-10-CM | POA: Diagnosis present

## 2017-02-06 DIAGNOSIS — Z95828 Presence of other vascular implants and grafts: Secondary | ICD-10-CM

## 2017-02-06 DIAGNOSIS — Z6820 Body mass index (BMI) 20.0-20.9, adult: Secondary | ICD-10-CM

## 2017-02-06 LAB — COMPREHENSIVE METABOLIC PANEL
ALBUMIN: 1.9 g/dL — AB (ref 3.5–5.0)
ALK PHOS: 233 U/L — AB (ref 38–126)
ALT: 47 U/L (ref 14–54)
AST: 88 U/L — AB (ref 15–41)
Anion gap: 9 (ref 5–15)
BILIRUBIN TOTAL: 0.9 mg/dL (ref 0.3–1.2)
BUN: 24 mg/dL — AB (ref 6–20)
CO2: 24 mmol/L (ref 22–32)
Calcium: 6.7 mg/dL — ABNORMAL LOW (ref 8.9–10.3)
Chloride: 92 mmol/L — ABNORMAL LOW (ref 101–111)
Creatinine, Ser: 0.72 mg/dL (ref 0.44–1.00)
GFR calc Af Amer: >60 mL/min (ref >60)
GFR calc non Af Amer: >60 mL/min (ref >60)
GLUCOSE: 99 mg/dL (ref 65–99)
POTASSIUM: 5.4 mmol/L — AB (ref 3.5–5.1)
Sodium: 125 mmol/L — ABNORMAL LOW (ref 135–145)
TOTAL PROTEIN: 5.1 g/dL — AB (ref 6.5–8.1)

## 2017-02-06 LAB — CBC WITH DIFFERENTIAL/PLATELET
Basophils Absolute: 0 10*3/uL (ref 0.0–0.1)
Basophils Relative: 0 %
EOS PCT: 0 %
Eosinophils Absolute: 0 10*3/uL (ref 0.0–0.7)
HCT: 29.8 % — ABNORMAL LOW (ref 36.0–46.0)
HEMOGLOBIN: 9.2 g/dL — AB (ref 12.0–15.0)
Lymphocytes Relative: 19 %
Lymphs Abs: 2 10*3/uL (ref 0.7–4.0)
MCH: 24.6 pg — ABNORMAL LOW (ref 26.0–34.0)
MCHC: 30.9 g/dL (ref 30.0–36.0)
MCV: 79.7 fL (ref 78.0–100.0)
MONOS PCT: 10 %
Monocytes Absolute: 1.1 10*3/uL — ABNORMAL HIGH (ref 0.1–1.0)
NEUTROS PCT: 71 %
Neutro Abs: 7.6 10*3/uL (ref 1.7–7.7)
Platelets: 346 10*3/uL (ref 150–400)
RBC: 3.74 MIL/uL — AB (ref 3.87–5.11)
RDW: 20 % — AB (ref 11.5–15.5)
WBC Morphology: INCREASED
WBC: 10.7 10*3/uL — AB (ref 4.0–10.5)

## 2017-02-06 LAB — CBC
HCT: 23.5 % — ABNORMAL LOW (ref 36.0–46.0)
Hemoglobin: 7.4 g/dL — ABNORMAL LOW (ref 12.0–15.0)
MCH: 25 pg — ABNORMAL LOW (ref 26.0–34.0)
MCHC: 31.5 g/dL (ref 30.0–36.0)
MCV: 79.4 fL (ref 78.0–100.0)
PLATELETS: 305 10*3/uL (ref 150–400)
RBC: 2.96 MIL/uL — ABNORMAL LOW (ref 3.87–5.11)
RDW: 19.7 % — AB (ref 11.5–15.5)
WBC: 9.7 10*3/uL (ref 4.0–10.5)

## 2017-02-06 LAB — HEMOGLOBIN AND HEMATOCRIT, BLOOD
HCT: 30.9 % — ABNORMAL LOW (ref 36.0–46.0)
Hemoglobin: 10.2 g/dL — ABNORMAL LOW (ref 12.0–15.0)

## 2017-02-06 LAB — I-STAT CG4 LACTIC ACID, ED
LACTIC ACID, VENOUS: 0.68 mmol/L (ref 0.5–1.9)
Lactic Acid, Venous: 2.36 mmol/L (ref 0.5–1.9)

## 2017-02-06 LAB — BASIC METABOLIC PANEL
ANION GAP: 7 (ref 5–15)
BUN: 24 mg/dL — ABNORMAL HIGH (ref 6–20)
CALCIUM: 6.3 mg/dL — AB (ref 8.9–10.3)
CO2: 23 mmol/L (ref 22–32)
CREATININE: 0.61 mg/dL (ref 0.44–1.00)
Chloride: 95 mmol/L — ABNORMAL LOW (ref 101–111)
Glucose, Bld: 88 mg/dL (ref 65–99)
Potassium: 4.8 mmol/L (ref 3.5–5.1)
SODIUM: 125 mmol/L — AB (ref 135–145)

## 2017-02-06 LAB — ABO/RH: ABO/RH(D): A POS

## 2017-02-06 LAB — PREPARE RBC (CROSSMATCH)

## 2017-02-06 MED ORDER — IOPAMIDOL (ISOVUE-300) INJECTION 61%
INTRAVENOUS | Status: AC
  Administered 2017-02-06: 100 mL
  Filled 2017-02-06: qty 100

## 2017-02-06 MED ORDER — PANTOPRAZOLE SODIUM 40 MG IV SOLR
40.0000 mg | Freq: Two times a day (BID) | INTRAVENOUS | Status: DC
  Administered 2017-02-06 – 2017-02-12 (×13): 40 mg via INTRAVENOUS
  Filled 2017-02-06 (×13): qty 40

## 2017-02-06 MED ORDER — IOPAMIDOL (ISOVUE-300) INJECTION 61%
INTRAVENOUS | Status: AC
  Administered 2017-02-06: 30 mL
  Filled 2017-02-06: qty 30

## 2017-02-06 MED ORDER — COBIMETINIB FUMARATE 20 MG PO TABS
60.0000 mg | ORAL_TABLET | Freq: Every day | ORAL | Status: DC
  Administered 2017-02-06: 60 mg via ORAL
  Filled 2017-02-06 (×2): qty 3

## 2017-02-06 MED ORDER — DIAZEPAM 5 MG/ML IJ SOLN
2.5000 mg | Freq: Once | INTRAMUSCULAR | Status: AC
  Administered 2017-02-07: 2.5 mg via INTRAVENOUS
  Filled 2017-02-06: qty 2

## 2017-02-06 MED ORDER — ALPRAZOLAM 0.25 MG PO TABS
0.2500 mg | ORAL_TABLET | Freq: Every evening | ORAL | Status: DC | PRN
  Administered 2017-02-08 – 2017-02-11 (×5): 0.5 mg via ORAL
  Filled 2017-02-06 (×5): qty 2

## 2017-02-06 MED ORDER — ACETAMINOPHEN 325 MG PO TABS: 650.0000 mg | ORAL_TABLET | Freq: Four times a day (QID) | ORAL | Status: DC | PRN

## 2017-02-06 MED ORDER — MORPHINE SULFATE ER 30 MG PO TBCR
30.0000 mg | EXTENDED_RELEASE_TABLET | Freq: Every day | ORAL | Status: DC
  Administered 2017-02-07 – 2017-02-08 (×2): 30 mg via ORAL
  Filled 2017-02-06 (×2): qty 1

## 2017-02-06 MED ORDER — CALCIUM GLUCONATE 10 % IV SOLN
1.0000 g | Freq: Once | INTRAVENOUS | Status: AC
  Administered 2017-02-06: 1 g via INTRAVENOUS
  Filled 2017-02-06: qty 10

## 2017-02-06 MED ORDER — SODIUM CHLORIDE 0.9 % IV BOLUS (SEPSIS)
1000.0000 mL | Freq: Once | INTRAVENOUS | Status: AC
  Administered 2017-02-06: 1000 mL via INTRAVENOUS

## 2017-02-06 MED ORDER — ACETAMINOPHEN 325 MG PO TABS
650.0000 mg | ORAL_TABLET | Freq: Once | ORAL | Status: AC
  Administered 2017-02-06: 650 mg via ORAL
  Filled 2017-02-06: qty 2

## 2017-02-06 MED ORDER — MORPHINE SULFATE (PF) 2 MG/ML IV SOLN
2.0000 mg | Freq: Once | INTRAVENOUS | Status: AC
  Administered 2017-02-06: 2 mg via INTRAVENOUS
  Filled 2017-02-06: qty 1

## 2017-02-06 MED ORDER — ONDANSETRON HCL 4 MG PO TABS: 4.0000 mg | ORAL_TABLET | Freq: Four times a day (QID) | ORAL | Status: DC | PRN

## 2017-02-06 MED ORDER — TRAMADOL HCL 50 MG PO TABS: 50.0000 mg | ORAL_TABLET | Freq: Four times a day (QID) | ORAL | Status: DC | PRN

## 2017-02-06 MED ORDER — DIPHENHYDRAMINE HCL 50 MG/ML IJ SOLN
25.0000 mg | Freq: Once | INTRAMUSCULAR | Status: AC
  Administered 2017-02-06: 25 mg via INTRAVENOUS
  Filled 2017-02-06: qty 1

## 2017-02-06 MED ORDER — SODIUM CHLORIDE 0.9 % IV SOLN
Freq: Once | INTRAVENOUS | Status: AC
  Administered 2017-02-06: 10:00:00 via INTRAVENOUS

## 2017-02-06 MED ORDER — ONDANSETRON HCL 4 MG/2ML IJ SOLN
4.0000 mg | Freq: Four times a day (QID) | INTRAMUSCULAR | Status: DC | PRN
  Administered 2017-02-09 – 2017-02-12 (×3): 4 mg via INTRAVENOUS
  Filled 2017-02-06 (×4): qty 2

## 2017-02-06 MED ORDER — SODIUM CHLORIDE 0.9 % IV SOLN
1.0000 g | Freq: Once | INTRAVENOUS | Status: AC
  Administered 2017-02-06: 1 g via INTRAVENOUS
  Filled 2017-02-06: qty 10

## 2017-02-06 MED ORDER — SODIUM CHLORIDE 0.9% FLUSH
3.0000 mL | Freq: Two times a day (BID) | INTRAVENOUS | Status: DC
  Administered 2017-02-07 – 2017-02-10 (×5): 3 mL via INTRAVENOUS

## 2017-02-06 MED ORDER — OXYCODONE HCL 5 MG PO TABS
10.0000 mg | ORAL_TABLET | ORAL | Status: DC | PRN
  Administered 2017-02-06 – 2017-02-12 (×9): 10 mg via ORAL
  Filled 2017-02-06 (×9): qty 2

## 2017-02-06 MED ORDER — PROCHLORPERAZINE MALEATE 10 MG PO TABS
10.0000 mg | ORAL_TABLET | Freq: Four times a day (QID) | ORAL | Status: DC | PRN
  Administered 2017-02-10 (×2): 10 mg via ORAL
  Filled 2017-02-06 (×3): qty 1

## 2017-02-06 MED ORDER — SODIUM CHLORIDE 0.9% FLUSH: 10.0000 mL | INTRAVENOUS | Status: DC | PRN

## 2017-02-06 MED ORDER — SODIUM CHLORIDE 0.9 % IV SOLN
INTRAVENOUS | Status: DC
  Administered 2017-02-06 – 2017-02-08 (×6): via INTRAVENOUS

## 2017-02-06 MED ORDER — ACETAMINOPHEN 650 MG RE SUPP: 650.0000 mg | Freq: Four times a day (QID) | RECTAL | Status: DC | PRN

## 2017-02-06 MED ORDER — CYCLOBENZAPRINE HCL 10 MG PO TABS
10.0000 mg | ORAL_TABLET | Freq: Three times a day (TID) | ORAL | Status: DC | PRN
  Administered 2017-02-06 – 2017-02-12 (×8): 10 mg via ORAL
  Filled 2017-02-06 (×8): qty 1

## 2017-02-06 NOTE — Progress Notes (Signed)
[name]   DOB:10-12-1965   OB#:[id number]   HUT#:[id number]  Oncology follow-up note  Subjective: Patient is well-known to me, under my care for her metastatic colon cancer. She was recently hospitalized for small PE and discharged home on Xarelto 15 mg twice daily. She started noticing dark red stool in the colostomy bag since last Friday, came in yesterday for worsening fatigue and nausea, and was admitted for GI bleeding. Hemoglobin was 9.2 in ED, unchanged from 3 days ago, but and dropped to 7.2 after IVF, and received 2u blood today.   Objective:  Vitals:   02/06/17 1554 02/06/17 1716  BP: 97/66 99/65  Pulse: (!) 104 99  Resp: 18 18  Temp: 98.5 F (36.9 C) 98.2 F (36.8 C)    Body mass index is 20.7 kg/m.  Intake/Output Summary (Last 24 hours) at 02/06/17 1955 Last data filed at 02/06/17 1558  Gross per 24 hour  Intake             1660 ml  Output              700 ml  Net              960 ml     Sclerae unicteric  Oropharynx clear  No peripheral adenopathy  Lungs clear -- no rales or rhonchi  Heart regular rate and rhythm  Abdomen mild tenderness, dark brown loose stool in colostomy bag  MSK no focal spinal tenderness, no peripheral edema  Neuro nonfocal    CBG (last 3)  No results for input(s): GLUCAP in the last 72 hours.   Labs:  Lab Results  Component Value Date   WBC 9.7 02/06/2017   HGB 10.2 (L) 02/06/2017   HCT 30.9 (L) 02/06/2017   MCV 79.4 02/06/2017   PLT 305 02/06/2017   NEUTROABS 7.6 02/06/2017   CMP Latest Ref Rng & Units 02/06/2017 02/06/2017 02/01/2017  Glucose 65 - 99 mg/dL 88 99 79  BUN 6 - 20 mg/dL 24(H) 24(H) 12  Creatinine 0.44 - 1.00 mg/dL 0.61 0.72 0.43(L)  Sodium 135 - 145 mmol/L 125(L) 125(L) 130(L)  Potassium 3.5 - 5.1 mmol/L 4.8 5.4(H) 3.8  Chloride 101 - 111 mmol/L 95(L) 92(L) 99(L)  CO2 22 - 32 mmol/L 23 24 23   Calcium 8.9 - 10.3 mg/dL 6.3(LL) 6.7(L) 7.6(L)  Total Protein 6.5 - 8.1 g/dL - 5.1(L) 5.3(L)  Total Bilirubin 0.3 -  1.2 mg/dL - 0.9 0.8  Alkaline Phos 38 - 126 U/L - 233(H) 158(H)  AST 15 - 41 U/L - 88(H) 17  ALT 14 - 54 U/L - 47 14     Urine Studies No results for input(s): UHGB, CRYS in the last 72 hours.  Invalid input(s): UACOL, UAPR, USPG, UPH, UTP, UGL, UKET, UBIL, UNIT, UROB, [place], UEPI, UWBC, Duwayne Heck [place], [place]  Basic Metabolic Panel:  Recent Labs Lab 01/31/17 1816 02/01/17 0750 02/06/17 0020 02/06/17 0624  NA 126* 130* 125* 125*  K 4.0 3.8 5.4* 4.8  CL 96* 99* 92* 95*  CO2 22 23 24 23   GLUCOSE 91 79 99 88  BUN 16 12 24* 24*  CREATININE 0.44 0.43* 0.72 0.61  CALCIUM 7.9* 7.6* 6.7* 6.3*   GFR Estimated Creatinine Clearance: 59.1 mL/min (by C-G formula based on SCr of 0.61 mg/dL). Liver Function Tests:  Recent Labs Lab 02/01/17 0750 02/06/17 0020  AST 17 88*  ALT 14 47  ALKPHOS 158* 233*  BILITOT 0.8 0.9  PROT 5.3* 5.1*  ALBUMIN 2.0* 1.9*   No results for input(s): LIPASE, AMYLASE in the last 168 hours. No results for input(s): AMMONIA in the last 168 hours. Coagulation profile  Recent Labs Lab 02/01/17 0114 02/01/17 1841  INR 2.24 2.01    CBC:  Recent Labs Lab 01/31/17 1816 02/01/17 0750 02/02/17 0405 02/06/17 0020 02/06/17 0624 02/06/17 1818  WBC 12.8* 9.6 12.7* 10.7* 9.7  --   NEUTROABS 9.3*  --   --  7.6  --   --   HGB 10.6* 10.0* 9.2* 9.2* 7.4* 10.2*  HCT 33.0* 30.9* 28.9* 29.8* 23.5* 30.9*  MCV 78.2 78.8 79.4 79.7 79.4  --   PLT 327 309 303 346 305  --    Cardiac Enzymes:  Recent Labs Lab 02/01/17 0248 02/01/17 0750 02/01/17 1517  TROPONINI <0.03 <0.03 <0.03   BNP: Invalid input(s): POCBNP CBG:  Recent Labs Lab 02/01/17 0740 02/02/17 0739  GLUCAP 79 91   D-Dimer No results for input(s): DDIMER in the last 72 hours. Hgb A1c No results for input(s): HGBA1C in the last 72 hours. Lipid Profile No results for input(s): CHOL, HDL, LDLCALC, TRIG, CHOLHDL, LDLDIRECT in the last 72 hours. Thyroid function studies No  results for input(s): TSH, T4TOTAL, T3FREE, THYROIDAB in the last 72 hours.  Invalid input(s): FREET3 Anemia work up No results for input(s): VITAMINB12, FOLATE, FERRITIN, TIBC, IRON, RETICCTPCT in the last 72 hours. Microbiology No results found for this or any previous visit (from the past 240 hour(s)).    Studies:  No results found.  Assessment: 51 y.o. with past medical history of metastatic colon cancer to peritoneum and liver, was recently diagnosed and hospitalized for segmental PE, discharged home on Xarelto 15 mg twice daily, was admitted last night due to GI bleeding, required blood transfusion.   1. GI bleeding, secondary to Xarelto and peritoneal metastasis 2. Recent diagnosed segmental PE 3. Stage IV metastatic colon cancer, on Atezolitumab and cobimetinib  4. Severe protein and calorie malnutrition 5. Chronic pain and fibromyalgia 6. 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 Given her hypercoagulopathy, filter will increase the risk of thrombosis, and the risk is overweight the benefit of prophylactic placement of IVC filter if no LE DVT  -if her GI bleeding stop completely, I may consider DVT prophylactic dose of Xarelto 10-15mg  daily, if she can tolerate (no significant bleeding). I will wait for 3-5 days before restart Xarelto  -I agree with GI and surgery, If her GI bleeding stops after holding Xarelto, I do not think she needs further workup -I'll follow-up her closely in my clinic, including lab and visit    Truitt Merle, MD 02/06/2017  7:55 PM

## 2017-02-06 NOTE — ED Notes (Signed)
Attempted report x 1 to 6E 

## 2017-02-06 NOTE — ED Notes (Signed)
Chicken broth provided

## 2017-02-06 NOTE — H&P (Signed)
History and Physical    [name] JOA:[id number] DOB: 1966/01/21 DOA: 02/06/2017  PCP: Carol Ada, MD  Patient coming from: Home  I have personally briefly reviewed patient's old medical records in [place]  Chief Complaint: Blood in ostomy bag  HPI: [name] is a 51 y.o. 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  Patient had colon resection with colostomy creation earlier this year in Jan.  Patient was just admitted to our service from 7/10 to 7/12 with new small PE causing chest pain.  No R heart strain evident on CTA chest at that time.  She was started on Xarelto and discharged.  Yesterday patient felt well, today she had nausea and dry heaves and felt generally weak throughout the day.  Tonight she started having bleeding into her colostomy bag.  She has filled the colostomy bag twice so far with blood.  No abd pain.   ED Course: HGB 9.2 unchanged from discharge on the 12th.  Hospitalist called to admit patient.   Review of Systems: As per HPI otherwise 10 point review of systems negative.   Past Medical History:  Diagnosis Date  . Anemia   . Anxiety   . Cancer ([place])    colon cancer stage 3  . Constipation   . Depression   . Fibromyalgia   . GERD (gastroesophageal reflux disease)   . JP drain bleeding     Past Surgical History:  Procedure Laterality Date  . ABDOMINAL HYSTERECTOMY    . COLON SURGERY  08/03/2015  . COLOSTOMY REVISION N/A 08/03/2015   Procedure: COLON RESECTION RIGHT;  Surgeon: Rolm Bookbinder, MD;  Location: [facility];  Service: General;  Laterality: N/A;  . GASTRIC BYPASS    . IR GENERIC HISTORICAL  05/30/2016   IR FLUORO GUIDE PORT INSERTION RIGHT 05/30/2016 Aletta Edouard, MD [facility]  . IR GENERIC HISTORICAL  05/30/2016   IR US GUIDE VASC ACCESS RIGHT 05/30/2016 Aletta Edouard, MD [facility]  . LAPAROSCOPIC ILEOCECECTOMY Right 08/03/2015   Procedure: LAPAROSCOPIC DIAGNOSTIC RIGHT COLECTOMY;  Surgeon: Rolm Bookbinder, MD;  Location: [facility];  Service: General;  Laterality: Right;  . LAPAROSCOPY N/A 10/13/2016   Procedure: left upper quadrant LOOP COLOSTOMY;  Surgeon: Greer Pickerel, MD;  Location: [facility];  Service: General;  Laterality: N/A;     reports that she has never smoked. She has never used smokeless tobacco. She reports that she drinks about 1.2 oz of alcohol per week . She reports that she does not use drugs.  Allergies  Allergen Reactions  . Latex Hives  . Cymbalta [Duloxetine Hcl] Rash  . Penicillins Rash    Has patient had a PCN reaction causing immediate rash, facial/tongue/throat swelling, SOB or lightheadedness with hypotension: No Has patient had a PCN reaction causing severe rash involving mucus membranes or skin necrosis: No Has patient had a PCN reaction that required hospitalization No Has patient had a PCN reaction occurring within the last 10 years: No If all of the above answers are "NO", then may proceed with Cephalosporin use.     Family History  Problem Relation Age of Onset  . Cancer Father 38       small cell lung cancer and colon cancer   . Cancer Sister 50       small cell lung cancer  . Cancer Maternal Aunt  ovarian cancer  . Cancer Paternal Uncle        brain cancer   . Cancer Other 40       ovarian cancer (maternal niece)     Prior to Admission medications   Medication Sig Start Date End Date Taking? Authorizing Provider  ALPRAZolam (XANAX) 0.25 MG tablet Take 1-2 tablets (0.25-0.5 mg total) by mouth at bedtime as needed for anxiety or sleep. 01/23/17  Yes Truitt Merle, MD  cobimetinib fumarate (COTELLIC) 20 MG tablet Take 3 tablets (60 mg total) by mouth Cooley. Take on days 1-21. Repeat every 28 days. Avoid sun exposure. 01/23/17  Yes Truitt Merle, MD  cyclobenzaprine (FLEXERIL) 10 MG tablet Take 1 tablet (10 mg total) by mouth 3  (three) times Cooley as needed for muscle spasms. 03/18/16  Yes Truitt Merle, MD  morphine (MS CONTIN) 30 MG 12 hr tablet Take 1 tablet (30 mg total) by mouth every 12 (twelve) hours. Patient taking differently: Take 30 mg by mouth at bedtime.  01/23/17  Yes Truitt Merle, MD  Oxycodone HCl 10 MG TABS Take 1 tablet (10 mg total) by mouth every 4 (four) hours. 01/23/17  Yes Truitt Merle, MD  prochlorperazine (COMPAZINE) 10 MG tablet Take 1 tablet (10 mg total) by mouth every 6 (six) hours as needed for nausea or vomiting. 01/23/17  Yes Truitt Merle, MD  traMADol (ULTRAM) 50 MG tablet Take 1-2 tablets (50-100 mg total) by mouth every 6 (six) hours as needed. Patient taking differently: Take 50-100 mg by mouth every 6 (six) hours as needed for severe pain.  04/20/16  Yes Truitt Merle, MD  clindamycin (CLINDAGEL) 1 % gel Apply topically 2 (two) times Cooley. 01/23/17   Truitt Merle, MD  lidocaine-prilocaine (EMLA) cream Apply 1 application topically as needed. Patient not taking: Reported on 02/06/2017 05/23/16   Truitt Merle, MD  triamcinolone ointment (KENALOG) 0.5 % Apply 1 application topically 2 (two) times Cooley. 01/23/17   Truitt Merle, MD    Physical Exam: Vitals:   02/06/17 0013 02/06/17 0040 02/06/17 0130 02/06/17 0300  BP: 96/81 95/76 98/79  94/75  Pulse: (!) 122 (!) 120 (!) 115 (!) 112  Resp: 15 13 14 12   Temp: 97.7 F (36.5 C)     TempSrc: Oral     SpO2: 99% 97% 95% 96%  Weight:        Constitutional: NAD, calm, comfortable Eyes: PERRL, lids and conjunctivae normal ENMT: Mucous membranes are moist. Posterior pharynx clear of any exudate or lesions.Normal dentition.  Neck: normal, supple, no masses, no thyromegaly Respiratory: clear to auscultation bilaterally, no wheezing, no crackles. Normal respiratory effort. No accessory muscle use.  Cardiovascular: Regular rate and rhythm, no murmurs / rubs / gallops. No extremity edema. 2+ pedal pulses. No carotid bruits.  Abdomen: no tenderness, no masses palpated. No  hepatosplenomegaly. Bowel sounds positive.  Musculoskeletal: no clubbing / cyanosis. No joint deformity upper and lower extremities. Good ROM, no contractures. Normal muscle tone.  Skin: no rashes, lesions, ulcers. No induration Neurologic: CN 2-12 grossly intact. Sensation intact, DTR normal. Strength 5/5 in all 4.  Psychiatric: Normal judgment and insight. Alert and oriented x 3. Normal mood.    Labs on Admission: I have personally reviewed following labs and imaging studies  CBC:  Recent Labs Lab 01/31/17 1816 02/01/17 0750 02/02/17 0405 02/06/17 0020  WBC 12.8* 9.6 12.7* 10.7*  NEUTROABS 9.3*  --   --  7.6  HGB 10.6* 10.0* 9.2* 9.2*  HCT 33.0* 30.9* 28.9* 29.8*  MCV 78.2 78.8 79.4 79.7  PLT 327 309 303 676   Basic Metabolic Panel:  Recent Labs Lab 01/31/17 1816 02/01/17 0750 02/06/17 0020  NA 126* 130* 125*  K 4.0 3.8 5.4*  CL 96* 99* 92*  CO2 22 23 24   GLUCOSE 91 79 99  BUN 16 12 24*  CREATININE 0.44 0.43* 0.72  CALCIUM 7.9* 7.6* 6.7*   GFR: Estimated Creatinine Clearance: 59.1 mL/min (by C-G formula based on SCr of 0.72 mg/dL). Liver Function Tests:  Recent Labs Lab 02/01/17 0750 02/06/17 0020  AST 17 88*  ALT 14 47  ALKPHOS 158* 233*  BILITOT 0.8 0.9  PROT 5.3* 5.1*  ALBUMIN 2.0* 1.9*   No results for input(s): LIPASE, AMYLASE in the last 168 hours. No results for input(s): AMMONIA in the last 168 hours. Coagulation Profile:  Recent Labs Lab 02/01/17 0114 02/01/17 1841  INR 2.24 2.01   Cardiac Enzymes:  Recent Labs Lab 02/01/17 0248 02/01/17 0750 02/01/17 1517  TROPONINI <0.03 <0.03 <0.03   BNP (last 3 results) No results for input(s): PROBNP in the last 8760 hours. HbA1C: No results for input(s): HGBA1C in the last 72 hours. CBG:  Recent Labs Lab 02/01/17 0740 02/02/17 0739  GLUCAP 79 91   Lipid Profile: No results for input(s): CHOL, HDL, LDLCALC, TRIG, CHOLHDL, LDLDIRECT in the last 72 hours. Thyroid Function Tests: No  results for input(s): TSH, T4TOTAL, FREET4, T3FREE, THYROIDAB in the last 72 hours. Anemia Panel: No results for input(s): VITAMINB12, FOLATE, FERRITIN, TIBC, IRON, RETICCTPCT in the last 72 hours. Urine analysis:    Component Value Date/Time   COLORURINE YELLOW 10/09/2016 1629   APPEARANCEUR HAZY (A) 10/09/2016 1629   LABSPEC 1.023 10/09/2016 1629   LABSPEC 1.010 10/04/2016 1519   PHURINE 8.0 10/09/2016 1629   GLUCOSEU NEGATIVE 10/09/2016 1629   GLUCOSEU Negative 10/04/2016 1519   HGBUR NEGATIVE 10/09/2016 1629   BILIRUBINUR NEGATIVE 10/09/2016 1629   BILIRUBINUR Negative 10/04/2016 1519   KETONESUR 20 (A) 10/09/2016 1629   PROTEINUR 100 (A) 10/09/2016 1629   UROBILINOGEN 0.2 10/04/2016 1519   NITRITE NEGATIVE 10/09/2016 1629   LEUKOCYTESUR NEGATIVE 10/09/2016 1629   LEUKOCYTESUR Negative 10/04/2016 1519    Radiological Exams on Admission: No results found.  EKG: Independently reviewed.  Assessment/Plan Principal Problem:   Lower GI bleed Active Problems:   Cancer of right colon (HCC)   Anemia, iron deficiency   Hyponatremia   Pulmonary embolus (H[facility])   GI bleed    1. LGIB - blood in colostomy 1. IVF: 1L bolus NS in ED then 125 cc/hr 2. Call GI in AM 3. Stop Xarelto 4. Tele monitor 5. Bleed doesn't appear severe enough to indicate use of KCentra at this time.  Use KCentra if bleed becomes more severe / patient becomes unstable. 2. Anemia - 1. HGB 9.2, unchanged from discharge 4 days ago. 2. Repeat CBC at 0700 3. Type and cross complete, transfuse if needed 3. Recent PE - 1. Stop Xarelto in setting of significant bleed 2. BLE [place] ordered to look for DVT 3. If DVT present then would consult for IVC filter placement 4. Hyponatremia - 1. NS IVF 2. Repeat BMP at 0700 3. Also giving 1gm calcium gluconate for hypo calcemia (corrected calcium 8.4)  DVT prophylaxis: None at this time pending BLE [place] Code Status: Full Family Communication: No family in  room Disposition Plan: Home after admit Consults called: None Admission status: Admit to inpatient - inpatient status for major GIB.  Etta Quill DO [facility] Pager [phone]  If 7AM-7PM, please contact day team taking care of patient [email or web address] Password [email or web address]  02/06/2017, 3:41 AM

## 2017-02-06 NOTE — Consult Note (Addendum)
[facility] Nurse ostomy consult note Pt had ostomy surgery in March 2018 and is familiar to the [facility] team. Previous pouch has leaked on the patient.  Assisted with pouch application and clean up.  Pt states she is independent with pouch application and emptying when at home.  She has her own supplies at the bedside and nurses can assist with the procedure. Stoma type/location: Stoma is swollen, red and viable, 2 inches, above skin level.  She previously had large amt bleeding into the pouch, which appears to have resolved at this time. Peristomal assessment: Intact skin surrounding Output: Mod amt brown pasty stool,  Small amt bloody drainage to edge of stoma, no further active bleeding. Ostomy pouching: Assisted with application of 2 piece pouch with barrier ring.  Skin crusted with powder and skin prep. She has her own supplies at the bedside and nurses can assist with the procedure when pouch change needs to be performed again. Please re-consult if further assistance is needed.  Gae Dry MSN, RN, [place], [place], [place].

## 2017-02-06 NOTE — ED Provider Notes (Signed)
[facility] DEPT Provider Note   CSN: [id number] Arrival date & time: 02/06/17  0010     History   Chief Complaint Chief Complaint  Patient presents with  . GI Bleeding    HPI [name] is a 51 y.o. female.  The history is provided by the patient.  She has history of colon cancer with colostomy, and was recently hospitalized with pulmonary embolism. She is on rivaroxaban for the pulmonary embolism. She felt well yesterday. Today, she had nausea and dry heaves. She felt generally weak through the day. Tonight, she started having bleeding into her colostomy. She is felt to colostomy twice with blood. She denies abdominal pain.  Past Medical History:  Diagnosis Date  . Anemia   . Anxiety   . Cancer ([place])    colon cancer stage 3  . Constipation   . Depression   . Fibromyalgia   . GERD (gastroesophageal reflux disease)   . JP drain bleeding     Patient Active Problem List   Diagnosis Date Noted  . PE (pulmonary thromboembolism) ([place]) 02/01/2017  . Anxiety 02/01/2017  . Protein-calorie malnutrition, severe ([place]) 02/01/2017  . Pulmonary embolism ([place]) 02/01/2017  . Pulmonary embolus ([place])   . Goals of care, counseling/discussion 11/04/2016  . Abdominal pain 10/10/2016  . UTI (urinary tract infection) 10/10/2016  . Hyperglycemia 10/10/2016  . Uncontrolled pain 10/10/2016  . Peritoneal carcinomatosis ([place]) 10/10/2016  . Malignant ascites 10/10/2016  . Malignant neoplasm of colon ([place])   . SBO (small bowel obstruction) ([place])   . Port catheter in place 06/23/2016  . Hand foot syndrome 01/08/2016  . Open abdominal wall wound 10/12/2015  . Hyperphosphatemia 09/22/2015  . Hypoalbuminemia due to protein-calorie malnutrition ([place]) 09/22/2015  . Insomnia 09/22/2015  . Dehydration 09/21/2015  . Hyponatremia 09/21/2015  . Hypotension 09/21/2015  . Transaminitis 09/21/2015  . Anemia, iron deficiency 09/19/2015  . Cancer of right colon ([place]) 09/04/2015  . S/P  colectomy 08/03/2015  . Perforated appendicitis 04/02/2015  . Appendiceal abscess     Past Surgical History:  Procedure Laterality Date  . ABDOMINAL HYSTERECTOMY    . COLON SURGERY  08/03/2015  . COLOSTOMY REVISION N/A 08/03/2015   Procedure: COLON RESECTION RIGHT;  Surgeon: Rolm Bookbinder, MD;  Location: [facility];  Service: General;  Laterality: N/A;  . GASTRIC BYPASS    . IR GENERIC HISTORICAL  05/30/2016   IR FLUORO GUIDE PORT INSERTION RIGHT 05/30/2016 Aletta Edouard, MD [facility]  . IR GENERIC HISTORICAL  05/30/2016   IR US GUIDE VASC ACCESS RIGHT 05/30/2016 Aletta Edouard, MD [facility]  . LAPAROSCOPIC ILEOCECECTOMY Right 08/03/2015   Procedure: LAPAROSCOPIC DIAGNOSTIC RIGHT COLECTOMY;  Surgeon: Rolm Bookbinder, MD;  Location: [facility];  Service: General;  Laterality: Right;  . LAPAROSCOPY N/A 10/13/2016   Procedure: left upper quadrant LOOP COLOSTOMY;  Surgeon: Greer Pickerel, MD;  Location: [facility];  Service: General;  Laterality: N/A;    OB History    No data available       Home Medications    Prior to Admission medications   Medication Sig Start Date End Date Taking? Authorizing Provider  ALPRAZolam (XANAX) 0.25 MG tablet Take 1-2 tablets (0.25-0.5 mg total) by mouth at bedtime as needed for anxiety or sleep. 01/23/17   Truitt Merle, MD  clindamycin (CLINDAGEL) 1 % gel Apply topically 2 (two) times daily. 01/23/17   Truitt Merle, MD  cobimetinib fumarate (COTELLIC) 20 MG tablet Take 3 tablets (60 mg total) by mouth daily. Take on  days 1-21. Repeat every 28 days. Avoid sun exposure. 01/23/17   Truitt Merle, MD  cyclobenzaprine (FLEXERIL) 10 MG tablet Take 1 tablet (10 mg total) by mouth 3 (three) times daily as needed for muscle spasms. 03/18/16   Truitt Merle, MD  lidocaine-prilocaine (EMLA) cream Apply 1 application topically as needed. 05/23/16   Truitt Merle, MD  morphine (MS CONTIN) 30 MG 12 hr tablet Take 1 tablet (30 mg total) by mouth every 12 (twelve) hours. Patient taking differently:  Take 30 mg by mouth at bedtime.  01/23/17   Truitt Merle, MD  Oxycodone HCl 10 MG TABS Take 1 tablet (10 mg total) by mouth every 4 (four) hours. 01/23/17   Truitt Merle, MD  prochlorperazine (COMPAZINE) 10 MG tablet Take 1 tablet (10 mg total) by mouth every 6 (six) hours as needed for nausea or vomiting. 01/23/17   Truitt Merle, MD  Rivaroxaban 15 & 20 MG TBPK Take as directed: Start with one 15mg  tablet by mouth twice a day with food. On Day 22, switch to one 20mg  tablet once a day with food. 02/02/17   Rama, Venetia Maxon, MD  traMADol (ULTRAM) 50 MG tablet Take 1-2 tablets (50-100 mg total) by mouth every 6 (six) hours as needed. Patient taking differently: Take 50-100 mg by mouth every 6 (six) hours as needed for severe pain.  04/20/16   Truitt Merle, MD  triamcinolone ointment (KENALOG) 0.5 % Apply 1 application topically 2 (two) times daily. 01/23/17   Truitt Merle, MD    Family History Family History  Problem Relation Age of Onset  . Cancer Father 108       small cell lung cancer and colon cancer   . Cancer Sister 50       small cell lung cancer  . Cancer Maternal Aunt        ovarian cancer  . Cancer Paternal Uncle        brain cancer   . Cancer Other 40       ovarian cancer (maternal niece)    Social History Social History  Substance Use Topics  . Smoking status: Never Smoker  . Smokeless tobacco: Never Used  . Alcohol use 1.2 oz/week    2 Glasses of wine per week     Comment: moderate 1-2 times a month      Allergies   Latex; Cymbalta [duloxetine hcl]; and Penicillins   Review of Systems Review of Systems  All other systems reviewed and are negative.    Physical Exam Updated Vital Signs BP 96/81   Pulse (!) 122   Temp 97.7 F (36.5 C) (Oral)   Resp 15   Wt 48.1 kg (106 lb)   SpO2 99%   BMI 20.70 kg/m   Physical Exam  Nursing note and vitals reviewed.  Cachectic appearing 51 year old female, resting comfortably and in no acute distress. Vital signs are significant for  tachycardia. Oxygen saturation is 99%, which is normal. Head is normocephalic and atraumatic. PERRLA, EOMI. Oropharynx is clear. Neck is nontender and supple without adenopathy or JVD. Back is nontender and there is no CVA tenderness. Lungs are clear without rales, wheezes, or rhonchi. Chest is nontender. Heart has regular rate and rhythm without murmur. Abdomen is soft, mildly distended, with multiple abdominal wall nodules present in the region around the colostomy in the left midabdomen. Bright red blood is present in the colostomy. Peristalsis is hypoactive. Extremities have no cyanosis or edema, full range of motion is present.  Skin is warm and dry without rash. Neurologic: Mental status is normal, cranial nerves are intact, there are no motor or sensory deficits.  ED Treatments / Results  Labs (all labs ordered are listed, but only abnormal results are displayed) Labs Reviewed  CBC WITH DIFFERENTIAL/PLATELET - Abnormal; Notable for the following:       Result Value   WBC 10.7 (*)    RBC 3.74 (*)    Hemoglobin 9.2 (*)    HCT 29.8 (*)    MCH 24.6 (*)    RDW 20.0 (*)    Monocytes Absolute 1.1 (*)    All other components within normal limits  COMPREHENSIVE METABOLIC PANEL - Abnormal; Notable for the following:    Sodium 125 (*)    Potassium 5.4 (*)    Chloride 92 (*)    BUN 24 (*)    Calcium 6.7 (*)    Total Protein 5.1 (*)    Albumin 1.9 (*)    AST 88 (*)    Alkaline Phosphatase 233 (*)    All other components within normal limits  I-STAT CG4 LACTIC ACID, ED - Abnormal; Notable for the following:    Lactic Acid, Venous 2.36 (*)    All other components within normal limits  CBC  BASIC METABOLIC PANEL  I-STAT CG4 LACTIC ACID, ED  I-STAT CG4 LACTIC ACID, ED  TYPE AND SCREEN  ABO/RH   Procedures Procedures (including critical care time) CRITICAL CARE Performed by: WLNLG,XQJJH Total critical care time: 45 minutes Critical care time was exclusive of separately  billable procedures and treating other patients. Critical care was necessary to treat or prevent imminent or life-threatening deterioration. Critical care was time spent personally by me on the following activities: development of treatment plan with patient and/or surrogate as well as nursing, discussions with consultants, evaluation of patient's response to treatment, examination of patient, obtaining history from patient or surrogate, ordering and performing treatments and interventions, ordering and review of laboratory studies, ordering and review of radiographic studies, pulse oximetry and re-evaluation of patient's condition.  Medications Ordered in ED Medications  0.9 %  sodium chloride infusion ( Intravenous Transfusing/Transfer 02/06/17 0444)  morphine (MS CONTIN) 12 hr tablet 30 mg (not administered)  ALPRAZolam (XANAX) tablet 0.25-0.5 mg (not administered)  cobimetinib fumarate (COTELLIC) tablet 60 mg (not administered)  cyclobenzaprine (FLEXERIL) tablet 10 mg (not administered)  oxyCODONE (Oxy IR/ROXICODONE) immediate release tablet 10 mg (not administered)  prochlorperazine (COMPAZINE) tablet 10 mg (not administered)  traMADol (ULTRAM) tablet 50-100 mg (not administered)  acetaminophen (TYLENOL) tablet 650 mg (not administered)    Or  acetaminophen (TYLENOL) suppository 650 mg (not administered)  ondansetron (ZOFRAN) tablet 4 mg (not administered)    Or  ondansetron (ZOFRAN) injection 4 mg (not administered)  sodium chloride flush (NS) 0.9 % injection 3 mL (not administered)  calcium gluconate 1 g in sodium chloride 0.9 % 100 mL IVPB (1 g Intravenous Transfusing/Transfer 02/06/17 0444)  sodium chloride 0.9 % bolus 1,000 mL (0 mLs Intravenous Stopped 02/06/17 0444)     Initial Impression / Assessment and Plan / ED Course  I have reviewed the triage vital signs and the nursing notes.  Pertinent labs & imaging results that were available during my care of the patient were  reviewed by me and considered in my medical decision making (see chart for details).  Gastrointestinal bleeding and patient anticoagulated on rivaroxaban. At this point, I do not feel that she needs to be started on the protocol for rapid  correction of anticoagulation. Old records are reviewed confirming recent hospitalization with pulmonary embolism and starting patient on rivaroxaban. Screening labs are obtained and she'll be started on IV hydration.  Surprisingly, hemoglobin has not changed. This will need to be followed with serial hemoglobins. She has not had any significant bleeding since initial evaluation. Case is discussed with Dr. Alcario Drought of [facility] who agrees to admit the patient.  Final Clinical Impressions(s) / ED Diagnoses   Final diagnoses:  Gastrointestinal hemorrhage, unspecified gastrointestinal hemorrhage type  Chronic anticoagulation    New Prescriptions New Prescriptions   No medications on file     Delora Fuel, MD 16/10/96 [phone]

## 2017-02-06 NOTE — Consult Note (Addendum)
Referring Provider: Dr. [name] Primary Care Physician:  Carol Ada, MD Primary Gastroenterologist:  Althia Forts  Reason for Consultation:  GI bleed  HPI: [name] is a 51 y.o. 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 She is s/p a ileocectomy in 07/2015 where her colon cancer was diagnosed. On chemotherapy and followed by Dr. Burr Medico. She started having a small amount of purplish blood in her ostomy this past Friday and Saturday and then saw red blood in her ostomy Sunday and today. Has had mild abdominal cramping and bloating with the bleeding. Denies N/V/F/C. She is s/p gastric bypass in the past as well. Hgb 7.4 (9.2 on admit on 02/06/17). Hgb 10.6 on 01/31/17. Started on Xarelto earlier this month due to a newly diagnosed pulmonary embolus.   Past Medical History:  Diagnosis Date  . Anemia   . Anxiety   . Cancer ([place])    colon cancer stage 3  . Constipation   . Depression   . Fibromyalgia   . GERD (gastroesophageal reflux disease)   . JP drain bleeding     Past Surgical History:  Procedure Laterality Date  . ABDOMINAL HYSTERECTOMY    . COLON SURGERY  08/03/2015  . COLOSTOMY REVISION N/A 08/03/2015   Procedure: COLON RESECTION RIGHT;  Surgeon: Rolm Bookbinder, MD;  Location: [facility];  Service: General;  Laterality: N/A;  . GASTRIC BYPASS    . IR GENERIC HISTORICAL  05/30/2016   IR FLUORO GUIDE PORT INSERTION RIGHT 05/30/2016 Aletta Edouard, MD [facility]  . IR GENERIC HISTORICAL  05/30/2016   IR US GUIDE VASC ACCESS RIGHT 05/30/2016 Aletta Edouard, MD [facility]  . LAPAROSCOPIC ILEOCECECTOMY Right 08/03/2015   Procedure: LAPAROSCOPIC DIAGNOSTIC RIGHT COLECTOMY;  Surgeon: Rolm Bookbinder, MD;  Location: [facility];  Service: General;  Laterality: Right;  . LAPAROSCOPY N/A 10/13/2016   Procedure: left upper quadrant LOOP COLOSTOMY;  Surgeon: Greer Pickerel, MD;  [facility];  Service: General;   Laterality: N/A;    Prior to Admission medications   Medication Sig Start Date End Date Taking? Authorizing Provider  ALPRAZolam (XANAX) 0.25 MG tablet Take 1-2 tablets (0.25-0.5 mg total) by mouth at bedtime as needed for anxiety or sleep. 01/23/17  Yes Truitt Merle, MD  cobimetinib fumarate (COTELLIC) 20 MG tablet Take 3 tablets (60 mg total) by mouth daily. Take on days 1-21. Repeat every 28 days. Avoid sun exposure. 01/23/17  Yes Truitt Merle, MD  cyclobenzaprine (FLEXERIL) 10 MG tablet Take 1 tablet (10 mg total) by mouth 3 (three) times daily as needed for muscle spasms. 03/18/16  Yes Truitt Merle, MD  morphine (MS CONTIN) 30 MG 12 hr tablet Take 1 tablet (30 mg total) by mouth every 12 (twelve) hours. Patient taking differently: Take 30 mg by mouth at bedtime.  01/23/17  Yes Truitt Merle, MD  Oxycodone HCl 10 MG TABS Take 1 tablet (10 mg total) by mouth every 4 (four) hours. 01/23/17  Yes Truitt Merle, MD  prochlorperazine (COMPAZINE) 10 MG tablet Take 1 tablet (10 mg total) by mouth every 6 (six) hours as needed for nausea or vomiting. 01/23/17  Yes Truitt Merle, MD  traMADol (ULTRAM) 50 MG tablet Take 1-2 tablets (50-100 mg total) by mouth every 6 (six) hours as needed. Patient taking differently: Take 50-100 mg by mouth every 6 (six) hours as needed for severe pain.  04/20/16  Yes Truitt Merle, MD  clindamycin (CLINDAGEL) 1 % gel Apply  topically 2 (two) times daily. 01/23/17   Truitt Merle, MD  lidocaine-prilocaine (EMLA) cream Apply 1 application topically as needed. Patient not taking: Reported on 02/06/2017 05/23/16   Truitt Merle, MD  triamcinolone ointment (KENALOG) 0.5 % Apply 1 application topically 2 (two) times daily. 01/23/17   Truitt Merle, MD    Scheduled Meds: . cobimetinib fumarate  60 mg Oral Daily  . morphine  30 mg Oral QHS  . pantoprazole (PROTONIX) IV  40 mg Intravenous Q12H  . sodium chloride flush  3 mL Intravenous Q12H   Continuous Infusions: . sodium chloride 125 mL/hr at 02/06/17 1110   PRN  Meds:.acetaminophen **OR** acetaminophen, ALPRAZolam, cyclobenzaprine, ondansetron **OR** ondansetron (ZOFRAN) IV, oxyCODONE, prochlorperazine, sodium chloride flush, traMADol  Allergies as of 02/06/2017 - Review Complete 02/06/2017  Allergen Reaction Noted  . Latex Hives 08/03/2015  . Cymbalta [duloxetine hcl] Rash 03/06/2015  . Penicillins Rash 04/01/2015    Family History  Problem Relation Age of Onset  . Cancer Father 101       small cell lung cancer and colon cancer   . Cancer Sister 50       small cell lung cancer  . Cancer Maternal Aunt        ovarian cancer  . Cancer Paternal Uncle        brain cancer   . Cancer Other 40       ovarian cancer (maternal niece)    Social History   Social History  . Marital status: Significant Other    Spouse name: N/A  . Number of children: N/A  . Years of education: N/A   Occupational History  . underwriter support specialist    Social History Main Topics  . Smoking status: Never Smoker  . Smokeless tobacco: Never Used  . Alcohol use 1.2 oz/week    2 Glasses of wine per week     Comment: moderate 1-2 times a month   . Drug use: No  . Sexual activity: Not on file   Other Topics Concern  . Not on file   Social History Narrative   Single, fiance [name]   Works [employer] 11:30am to 8 pm    Review of Systems: All negative except as stated above in HPI.  Physical Exam: Vital signs: Vitals:   02/06/17 0940 02/06/17 1020  BP: 95/68 92/69  Pulse: (!) 116 (!) 116  Resp: 16 18  Temp: (!) 97.5 F (36.4 C) (!) 97.5 F (36.4 C)     General:   Lethargic, thin, no acute distress Head: atraumatic, oropharynx clear Eyes: anicteric sclera ENT: oropharynx clear Lungs:  Clear throughout to auscultation.   No wheezes, crackles, or rhonchi. No acute distress. Heart:  Regular rate and rhythm; no murmurs, clicks, rubs,  or gallops. Abdomen: diffusely tender with guarding, no rebound, ostomy noted with pink stoma and  small amount of dark red blood in ostomy bag, decreased bowel sounds  Rectal:  Deferred Ext: no edema  GI:  Lab Results:  Recent Labs  02/06/17 0020 02/06/17 0624  WBC 10.7* 9.7  HGB 9.2* 7.4*  HCT 29.8* 23.5*  PLT 346 305   BMET  Recent Labs  02/06/17 0020 02/06/17 0624  NA 125* 125*  K 5.4* 4.8  CL 92* 95*  CO2 24 23  GLUCOSE 99 88  BUN 24* 24*  CREATININE 0.72 0.61  CALCIUM 6.7* 6.3*   LFT  Recent Labs  02/06/17 0020  PROT 5.1*  ALBUMIN 1.9*  AST  88*  ALT 47  ALKPHOS 233*  BILITOT 0.9   PT/INR No results for input(s): LABPROT, INR in the last 72 hours.   Studies/Results: No results found.  Impression/Plan: Metastatic colon cancer with extensive peritoneal carcinomatosis having a GI bleed likely due to peritoneal implants in her small or remaining large bowel. Doubt diverticular bleed. Question perforated viscus and stat abd/pelvis CT with contrast ordered. Colonoscopy will not be able to stop bleeding from peritoneal implants. Continue to hold Xarelto but may need IV heparin (defer to primary team and oncology). Spoke with Dr. [name] and recommended surgery consult and oncology f/u. Dr. [name] also plans to discuss her anticoagulation with Dr. Burr Medico. May need a bleeding scan and angiogram if positive vs exp lap. Follow H/Hs closely. NPO until CT done and if no perforation then can resume clear liquid diet. Will follow.    LOS: 0 days   [place] C.  02/06/2017, 11:56 AM  Pager [phone]  AFTER 5 pm or on weekends please call [phone]

## 2017-02-06 NOTE — Consult Note (Signed)
[name] Consult/Admission Note  [name] 09/15/1965  [id number].    Requesting MD: Dr. Maudie Cooley Chief Complaint/Reason for Consult: GI bleed  HPI:    Pt is a 51 y.o. female with medical history significant of GERD, depression, anxiety, fibromyalgia, colon cancer-stage 3 (s/p of colectomy 08/02/16 Dr. Donne Cooley and colostomy 10/13/16 Dr. Redmond Cooley), anemia, who we have been asked to evaluate for GI bleed. Patient states she's been having intermittent abdominal pain for roughly 1 month but the pain has progressively worsened since Saturday when she also was feeling weak with SOB. Patient states she's been having more bloating with the abdominal pain. The pain is intermittent, severe at times, nonradiating located in her mid abdomen. Patient has also had blood in her ostomy bag mixed with her stool. She's had increased ostomy output. Patient is currently undergoing chemotherapy. She states she was feeling fine on Friday. Patient denies vomiting but has had dry heaves.    ROS:  Review of Systems  Constitutional: Negative for chills, diaphoresis and fever.  HENT: Negative for sore throat.   Respiratory: Positive for shortness of breath. Negative for cough and sputum production.   Cardiovascular: Negative for leg swelling.  Gastrointestinal: Positive for abdominal pain and blood in stool. Negative for vomiting.  Skin: Negative for rash.  Neurological: Positive for weakness. Negative for loss of consciousness.  All other systems reviewed and are negative.    Family History  Problem Relation Age of Onset  . Cancer Father 81       small cell lung cancer and colon cancer   . Cancer Sister 50       small cell lung cancer  . Cancer Maternal Aunt        ovarian cancer  . Cancer Paternal Uncle        brain cancer   . Cancer Other 40       ovarian cancer (maternal niece)    Past Medical History:  Diagnosis Date  . Anemia   . Anxiety   . Cancer ([name])    colon cancer stage  3  . Constipation   . Depression   . Fibromyalgia   . GERD (gastroesophageal reflux disease)   . JP drain bleeding     Past Surgical History:  Procedure Laterality Date  . ABDOMINAL HYSTERECTOMY    . COLON Cooley  08/03/2015  . COLOSTOMY REVISION N/A 08/03/2015   Procedure: COLON RESECTION RIGHT;  Surgeon: Tina Bookbinder, MD;  Location: [facility];  Service: General;  Laterality: N/A;  . GASTRIC BYPASS    . IR GENERIC HISTORICAL  05/30/2016   IR FLUORO GUIDE PORT INSERTION RIGHT 05/30/2016 Tina Edouard, MD [facility]  . IR GENERIC HISTORICAL  05/30/2016   IR US GUIDE VASC ACCESS RIGHT 05/30/2016 Tina Edouard, MD [facility]  . LAPAROSCOPIC ILEOCECECTOMY Right 08/03/2015   Procedure: LAPAROSCOPIC DIAGNOSTIC RIGHT COLECTOMY;  Surgeon: Tina Bookbinder, MD;  Location: [facility];  Service: General;  Laterality: Right;  . LAPAROSCOPY N/A 10/13/2016   Procedure: left upper quadrant LOOP COLOSTOMY;  Surgeon: Tina Pickerel, MD;  Location: [facility];  Service: General;  Laterality: N/A;    Social History:  reports that she has never smoked. She has never used smokeless tobacco. She reports that she drinks about 1.2 oz of alcohol per week . She reports that she does not use drugs.  Allergies:  Allergies  Allergen Reactions  . Latex Hives  . Cymbalta [Duloxetine Hcl] Rash  . Penicillins Rash    Has patient  had a PCN reaction causing immediate rash, facial/tongue/throat swelling, SOB or lightheadedness with hypotension: No Has patient had a PCN reaction causing severe rash involving mucus membranes or skin necrosis: No Has patient had a PCN reaction that required hospitalization No Has patient had a PCN reaction occurring within the last 10 years: No If all of the above answers are "NO", then may proceed with Cephalosporin use.     Medications Prior to Admission  Medication Sig Dispense Refill  . ALPRAZolam (XANAX) 0.25 MG tablet Take 1-2 tablets (0.25-0.5 mg total) by mouth at bedtime as  needed for anxiety or sleep. 60 tablet 0  . cobimetinib fumarate (COTELLIC) 20 MG tablet Take 3 tablets (60 mg total) by mouth daily. Take on days 1-21. Repeat every 28 days. Avoid sun exposure. 63 tablet 0  . cyclobenzaprine (FLEXERIL) 10 MG tablet Take 1 tablet (10 mg total) by mouth 3 (three) times daily as needed for muscle spasms. 60 tablet 0  . morphine (MS CONTIN) 30 MG 12 hr tablet Take 1 tablet (30 mg total) by mouth every 12 (twelve) hours. (Patient taking differently: Take 30 mg by mouth at bedtime. ) 60 tablet 0  . Oxycodone HCl 10 MG TABS Take 1 tablet (10 mg total) by mouth every 4 (four) hours. 120 tablet 0  . prochlorperazine (COMPAZINE) 10 MG tablet Take 1 tablet (10 mg total) by mouth every 6 (six) hours as needed for nausea or vomiting. 30 tablet 2  . traMADol (ULTRAM) 50 MG tablet Take 1-2 tablets (50-100 mg total) by mouth every 6 (six) hours as needed. (Patient taking differently: Take 50-100 mg by mouth every 6 (six) hours as needed for severe pain. ) 60 tablet 0  . clindamycin (CLINDAGEL) 1 % gel Apply topically 2 (two) times daily. 30 g 0  . lidocaine-prilocaine (EMLA) cream Apply 1 application topically as needed. (Patient not taking: Reported on 02/06/2017) 30 g 2  . triamcinolone ointment (KENALOG) 0.5 % Apply 1 application topically 2 (two) times daily. 30 g 0    Blood pressure 96/66, pulse (!) 104, temperature 98.4 F (36.9 C), temperature source Oral, resp. rate 18, weight 106 lb (48.1 kg), SpO2 100 %.  Physical Exam  Constitutional: She is oriented to person, place, and time. Vital signs are normal. No distress.  Thin frail white female, well appearing in no acute distress  HENT:  Head: Normocephalic and atraumatic.  Nose: Nose normal.  Eyes: Conjunctivae and EOM are normal. Right eye exhibits no discharge. Left eye exhibits no discharge. No scleral icterus.  Pupils equal  Neck: Normal range of motion. Neck supple.  Cardiovascular: Normal rate, regular rhythm,  normal heart sounds and intact distal pulses.  Exam reveals no gallop and no friction rub.   No murmur heard. Pulses:      Radial pulses are 2+ on the right side, and 2+ on the left side.  Pulmonary/Chest: Effort normal and breath sounds normal. No respiratory distress. She has no decreased breath sounds. She has no wheezes. She has no rhonchi. She has no rales.  Abdominal: Soft. She exhibits no distension and no mass. Bowel sounds are hyperactive. There is tenderness. There is no rebound and no guarding. No hernia.  2 firm masses appreciated just superior and left of umbilicus with moderate TTP, no guarding Stoma pink and large, stool with scant amount of blood in ostomy bag  Musculoskeletal: Normal range of motion. She exhibits no edema, tenderness or deformity.  Neurological: She is alert and oriented  to person, place, and time.  Skin: Skin is warm and dry. No rash noted. She is not diaphoretic.  Psychiatric: Mood and affect normal.  Nursing note and vitals reviewed.   Results for orders placed or performed during the hospital encounter of 02/06/17 (from the past 48 hour(s))  CBC with Differential     Status: Abnormal   Collection Time: 02/06/17 12:20 AM  Result Value Ref Range   WBC 10.7 (H) 4.0 - 10.5 K/uL   RBC 3.74 (L) 3.87 - 5.11 MIL/uL   Hemoglobin 9.2 (L) 12.0 - 15.0 g/dL   HCT 29.8 (L) 36.0 - 46.0 %   MCV 79.7 78.0 - 100.0 fL   MCH 24.6 (L) 26.0 - 34.0 pg   MCHC 30.9 30.0 - 36.0 g/dL   RDW 20.0 (H) 11.5 - 15.5 %   Platelets 346 150 - 400 K/uL   Neutrophils Relative % 71 %   Lymphocytes Relative 19 %   Monocytes Relative 10 %   Eosinophils Relative 0 %   Basophils Relative 0 %   Neutro Abs 7.6 1.7 - 7.7 K/uL   Lymphs Abs 2.0 0.7 - 4.0 K/uL   Monocytes Absolute 1.1 (H) 0.1 - 1.0 K/uL   Eosinophils Absolute 0.0 0.0 - 0.7 K/uL   Basophils Absolute 0.0 0.0 - 0.1 K/uL   RBC Morphology TARGET CELLS    WBC Morphology INCREASED BANDS (>20% BANDS)   Comprehensive metabolic  panel     Status: Abnormal   Collection Time: 02/06/17 12:20 AM  Result Value Ref Range   Sodium 125 (L) 135 - 145 mmol/L   Potassium 5.4 (H) 3.5 - 5.1 mmol/L   Chloride 92 (L) 101 - 111 mmol/L   CO2 24 22 - 32 mmol/L   Glucose, Bld 99 65 - 99 mg/dL   BUN 24 (H) 6 - 20 mg/dL   Creatinine, Ser 0.72 0.44 - 1.00 mg/dL   Calcium 6.7 (L) 8.9 - 10.3 mg/dL   Total Protein 5.1 (L) 6.5 - 8.1 g/dL   Albumin 1.9 (L) 3.5 - 5.0 g/dL   AST 88 (H) 15 - 41 U/L   ALT 47 14 - 54 U/L   Alkaline Phosphatase 233 (H) 38 - 126 U/L   Total Bilirubin 0.9 0.3 - 1.2 mg/dL   GFR calc non Af Amer >60 >60 mL/min   GFR calc Af Amer >60 >60 mL/min    Comment: (NOTE) The eGFR has been calculated using the CKD EPI equation. This calculation has not been validated in all clinical situations. eGFR's persistently <60 mL/min signify possible Chronic Kidney Disease.    Anion gap 9 5 - 15  Type and screen [place]     Status: None (Preliminary result)   Collection Time: 02/06/17 12:25 AM  Result Value Ref Range   ABO/RH(D) A POS    Antibody Screen NEG    Sample Expiration 02/09/2017    Unit Number [id number]    Blood Component Type RBC, LR IRR    Unit division 00    Status of Unit ISSUED    Transfusion Status OK TO TRANSFUSE    Crossmatch Result Compatible    Unit Number [id number]    Blood Component Type RBC, LR IRR    Unit division 00    Status of Unit ISSUED    Transfusion Status OK TO TRANSFUSE    Crossmatch Result Compatible   ABO/Rh     Status: None   Collection Time: 02/06/17 12:25 AM  Result Value Ref Range   ABO/RH(D) A POS   I-Stat CG4 Lactic Acid, ED     Status: Abnormal   Collection Time: 02/06/17 12:36 AM  Result Value Ref Range   Lactic Acid, Venous 2.36 (HH) 0.5 - 1.9 mmol/L  I-Stat CG4 Lactic Acid, ED     Status: None   Collection Time: 02/06/17  3:26 AM  Result Value Ref Range   Lactic Acid, Venous 0.68 0.5 - 1.9 mmol/L  CBC     Status: Abnormal    Collection Time: 02/06/17  6:24 AM  Result Value Ref Range   WBC 9.7 4.0 - 10.5 K/uL   RBC 2.96 (L) 3.87 - 5.11 MIL/uL   Hemoglobin 7.4 (L) 12.0 - 15.0 g/dL   HCT 23.5 (L) 36.0 - 46.0 %   MCV 79.4 78.0 - 100.0 fL   MCH 25.0 (L) 26.0 - 34.0 pg   MCHC 31.5 30.0 - 36.0 g/dL   RDW 19.7 (H) 11.5 - 15.5 %   Platelets 305 150 - 400 K/uL  Basic metabolic panel     Status: Abnormal   Collection Time: 02/06/17  6:24 AM  Result Value Ref Range   Sodium 125 (L) 135 - 145 mmol/L   Potassium 4.8 3.5 - 5.1 mmol/L   Chloride 95 (L) 101 - 111 mmol/L   CO2 23 22 - 32 mmol/L   Glucose, Bld 88 65 - 99 mg/dL   BUN 24 (H) 6 - 20 mg/dL   Creatinine, Ser 0.61 0.44 - 1.00 mg/dL   Calcium 6.3 (LL) 8.9 - 10.3 mg/dL    Comment: CRITICAL RESULT CALLED TO, READ BACK BY AND VERIFIED WITH: ANITA MINTZ,RN AT 0728 02/06/17 BY ZBEECH.    GFR calc non Af Amer >60 >60 mL/min   GFR calc Af Amer >60 >60 mL/min    Comment: (NOTE) The eGFR has been calculated using the CKD EPI equation. This calculation has not been validated in all clinical situations. eGFR's persistently <60 mL/min signify possible Chronic Kidney Disease.    Anion gap 7 5 - 15  Prepare RBC     Status: None   Collection Time: 02/06/17  7:32 AM  Result Value Ref Range   Order Confirmation ORDER PROCESSED BY BLOOD BANK    No results found.    Assessment/Plan  Colon cancer GI bleed - off Xarelto  - CT abd pending  Plan: will await results of CT. Agree with GI that this is likely from implants in large or small bowel. We will continue to follow. Thank you for the consult.   Kalman Drape, [facility] 02/06/2017, 2:51 PM Pager: [phone] Consults: [phone] Mon-Fri 7:00 am-4:30 pm Sat-Sun 7:00 am-11:30 am

## 2017-02-06 NOTE — ED Triage Notes (Signed)
Per EMS: Pt called EMS for Weakness N/V/D. Pt was seen Thursday and was put on Xerelto for a PE. Pt has stage 3 colon cancer. Last had chemo last night. Pt has ostomy and has noticed blood in her stool for the last 3 days.   A&Ox4

## 2017-02-06 NOTE — Progress Notes (Signed)
Patient ID: [name], female   DOB: 11/05/1965, 52 y.o.   MRN: [id number]                                                                PROGRESS NOTE                                                                                                                                                                                                             Patient Demographics:    [name], is a 51 y.o. female, DOB - [birth year], QIO:[id number]  Admit date - 02/06/2017   Admitting Physician Etta Quill, DO  Outpatient Primary MD for the patient is Carol Ada, MD  LOS - 0  Outpatient Specialists:    Chief Complaint  Patient presents with  . GI Bleeding       Brief Narrative  51 y.o. 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  Patient had colon resection with colostomy creation earlier this year in Jan.  Patient was just admitted to our service from 7/10 to 7/12 with new small PE causing chest pain.  No R heart strain evident on CTA chest at that time.  She was started on Xarelto and discharged.  Yesterday patient felt well, today she had nausea and dry heaves and felt generally weak throughout the day.  Tonight she started having bleeding into her colostomy bag.  She has filled the colostomy bag twice so far with blood.  No abd pain.   ED Course: HGB 9.2 unchanged from discharge on the 12th.  Hospitalist called to admit patient.     Subjective:    [name] today has blood in colostomy bag still.  Pt denies nausea this am.  Denies fever, chills, abd pain.  , No headache, No chest pain, No new weakness tingling or numbness, No Cough - SOB.    Assessment  & Plan :    Principal Problem:   Lower GI bleed Active Problems:   Cancer of right colon (HCC)   Anemia, iron deficiency   Hyponatremia   Pulmonary embolus (HCC)  GI bleed  LGIB -blood in colostomy OFF Xarelto NPO GI consultation this am.  Protonix 40mg  iv bid  Anemia  Hgb 9.2=>7.4, w mild hypotension Transfuse 2 units prbc  Hyperkalemia resolved Check cmp tomorrow  Recent PE - OFF Xarelto in setting of significant bleed BLE [place] ordered to look for DVT If DVT present then would consult for IVC filter placement  Hyponatremia  Cont NS IVF repeat BMP at 0700  DVT prophylaxis: None at this time pending BLE [place] Code Status: Full Family Communication: No family in room Disposition Plan: Home after admit Consults called: None Admission status: Admit to inpatient - inpatient status for major GIB.  Lab Results  Component Value Date   PLT 305 02/06/2017      Anti-infectives    None        Objective:   Vitals:   02/06/17 0300 02/06/17 0330 02/06/17 0400 02/06/17 0645  BP: 94/75 100/74 96/74 (!) 88/61  Pulse: (!) 112 (!) 109 (!) 110 (!) 109  Resp: 12 12 15 16   Temp:    97.7 F (36.5 C)  TempSrc:    Oral  SpO2: 96% 100% 100% 95%  Weight:        Wt Readings from Last 3 Encounters:  02/06/17 48.1 kg (106 lb)  02/01/17 48.4 kg (106 lb 12.8 oz)  01/23/17 46.9 kg (103 lb 6.4 oz)     Intake/Output Summary (Last 24 hours) at 02/06/17 0738 Last data filed at 02/06/17 0444  Gross per 24 hour  Intake             1000 ml  Output                0 ml  Net             1000 ml     Physical Exam  Awake Alert, Oriented X 3, No new F.N deficits, Normal affect [place].AT,PERRAL Supple Neck,No JVD, No cervical lymphadenopathy appriciated.  Symmetrical Chest wall movement, Good air movement bilaterally, CTAB RRR,No Gallops,Rubs or new Murmurs, No Parasternal Heave +ve B.Sounds, Abd Soft, No tenderness, No organomegaly appriciated, No rebound - guarding or rigidity. No Cyanosis, Clubbing or edema, No new Rash or bruise    +colostomy, blood in colostomy   Data Review:    CBC  Recent Labs Lab 01/31/17 1816 02/01/17 0750  02/02/17 0405 02/06/17 0020 02/06/17 0624  WBC 12.8* 9.6 12.7* 10.7* 9.7  HGB 10.6* 10.0* 9.2* 9.2* 7.4*  HCT 33.0* 30.9* 28.9* 29.8* 23.5*  PLT 327 309 303 346 305  MCV 78.2 78.8 79.4 79.7 79.4  MCH 25.1* 25.5* 25.3* 24.6* 25.0*  MCHC 32.1 32.4 31.8 30.9 31.5  RDW 17.8* 18.2* 18.3* 20.0* 19.7*  LYMPHSABS 2.4  --   --  2.0  --   MONOABS 1.0  --   --  1.1*  --   EOSABS 0.1  --   --  0.0  --   BASOSABS 0.0  --   --  0.0  --     Chemistries   Recent Labs Lab 01/31/17 1816 02/01/17 0750 02/06/17 0020 02/06/17 0624  NA 126* 130* 125* 125*  K 4.0 3.8 5.4* 4.8  CL 96* 99* 92* 95*  CO2 22 23 24 23   GLUCOSE 91 79 99 88  BUN 16 12 24* 24*  CREATININE 0.44 0.43* 0.72 0.61  CALCIUM 7.9* 7.6* 6.7* 6.3*  AST  --  17 88*  --   ALT  --  14 47  --   ALKPHOS  --  158* 233*  --   BILITOT  --  0.8 0.9  --    ------------------------------------------------------------------------------------------------------------------ No results for input(s): CHOL, HDL, LDLCALC, TRIG, CHOLHDL, LDLDIRECT in the last 72 hours.  Lab Results  Component Value Date   HGBA1C 5.7 (H) 07/29/2015   ------------------------------------------------------------------------------------------------------------------ No results for input(s): TSH, T4TOTAL, T3FREE, THYROIDAB in the last 72 hours.  Invalid input(s): FREET3 ------------------------------------------------------------------------------------------------------------------ No results for input(s): VITAMINB12, FOLATE, FERRITIN, TIBC, IRON, RETICCTPCT in the last 72 hours.  Coagulation profile  Recent Labs Lab 02/01/17 0114 02/01/17 1841  INR 2.24 2.01    No results for input(s): DDIMER in the last 72 hours.  Cardiac Enzymes  Recent Labs Lab 02/01/17 0248 02/01/17 0750 02/01/17 1517  TROPONINI <0.03 <0.03 <0.03   ------------------------------------------------------------------------------------------------------------------ No  results found for: BNP  Inpatient Medications  Scheduled Meds: . acetaminophen  650 mg Oral Once  . cobimetinib fumarate  60 mg Oral Daily  . diphenhydrAMINE  25 mg Intravenous Once  . morphine  30 mg Oral QHS  . sodium chloride flush  3 mL Intravenous Q12H   Continuous Infusions: . sodium chloride 125 mL/hr at 02/06/17 0322  . sodium chloride     PRN Meds:.acetaminophen **OR** acetaminophen, ALPRAZolam, cyclobenzaprine, ondansetron **OR** ondansetron (ZOFRAN) IV, oxyCODONE, prochlorperazine, sodium chloride flush, traMADol  Micro Results No results found for this or any previous visit (from the past 240 hour(s)).  Radiology Reports Dg Chest 2 View  Result Date: 01/31/2017 CLINICAL DATA:  51 y/o  F; chest pain. EXAM: CHEST  2 VIEW COMPARISON:  01/08/2017 chest radiograph.  12/22/2016 chest CT there FINDINGS: Stable normal cardiac silhouette. Right port catheter tip projects over lower SVC. No consolidation, effusion, or pneumothorax. Pulmonary nodules on CT are not visualized radiographically. No acute osseous abnormality is evident. IMPRESSION: No acute pulmonary process identified. Electronically Signed   By: Kristine Garbe M.D.   On: 01/31/2017 18:33   Ct Angio Chest Pe W And/or Wo Contrast  Result Date: 02/01/2017 CLINICAL DATA:  Chest pressure for 3 days EXAM: CT ANGIOGRAPHY CHEST WITH CONTRAST TECHNIQUE: Multidetector CT imaging of the chest was performed using the standard protocol during bolus administration of intravenous contrast. Multiplanar CT image reconstructions and MIPs were obtained to evaluate the vascular anatomy. CONTRAST:  55 mL Isovue 370 intravenous COMPARISON:  Radiograph 01/31/2017, CT chest 12/22/2016, 10/05/2016 FINDINGS: Cardiovascular: Satisfactory opacification of the pulmonary arteries to the segmental level. Small nonocclusive filling defect within a left lower lobe segmental/subsegmental pulmonary artery branch vessel. RV LV ratio is normal. No  other filling defects are visualized. Non aneurysmal aorta. No dissection. Nonenlarged heart. No pericardial effusion. Right-sided central venous port tip within the SVC. Minimal atherosclerosis. Mediastinum/Nodes: No significantly enlarged mediastinal lymph nodes. Midline trachea. No thyroid mass. Esophagus within normal limits. Lungs/Pleura: Multiple bilateral pulmonary nodules are again visualized. Index right middle lobe pulmonary nodule, series 7, image number 44, measures 7 mm compared to 5 mm previously. Index left upper lobe pulmonary nodule, series 7, image number 37 measures 8 mm, compared with 6 mm previously. No pleural effusion. No acute consolidation. Upper Abdomen: Peritoneal carcinomatosis with scalloped appearance of the liver and spleen. Calcifications within the low-density surrounding the liver. Heterogenous splenic enhancement likely due to arterial phase imaging. Dilated bowel in the upper abdomen. Postsurgical changes of the stomach. Dilated gallbladder measuring up to 4.6 cm. Musculoskeletal: No acute or suspicious bone lesion Review of the MIP images confirms the above findings. IMPRESSION: 1. Positive for small nonocclusive embolus in a left lower lobe segmental/subsegmental branch vessels. No elevation of the RV LV  ratio. No other filling defects are seen. 2. Multiple bilateral pulmonary nodules. Slight increased size of several pulmonary nodules as described above. 3. Peritoneal carcinomatosis partially imaged. 4. Dilated bowel in the upper abdomen, could relate to a bowel obstruction 5. Dilated gallbladder. Critical Value/emergent results were called by telephone at the time of interpretation on 02/01/2017 at 12:37 am to [place] who assumed care for the patient for [facility] , who verbally acknowledged these results. Aortic Atherosclerosis (ICD10-I70.0). Electronically Signed   By: Donavan Foil M.D.   On: 02/01/2017 00:38   Dg Abdomen Acute W/chest  Result Date: 01/08/2017 CLINICAL  DATA:  Acute onset of prolapsed colostomy. Initial encounter. EXAM: DG ABDOMEN ACUTE W/ 1V CHEST COMPARISON:  CT of the chest, abdomen and pelvis performed 12/22/2016 FINDINGS: The lungs are well-aerated and clear. There is no evidence of focal opacification, pleural effusion or pneumothorax. The cardiomediastinal silhouette is within normal limits. The visualized bowel gas pattern is unremarkable. Scattered stool and air are seen within the colon; there is no evidence of small bowel dilatation to suggest obstruction. No free intra-abdominal air is identified on the provided upright view. The patient's right chest port noted ending about the mid to distal SVC. No acute osseous abnormalities are seen; the sacroiliac joints are unremarkable in appearance. Postoperative change is noted about the left upper quadrant. IMPRESSION: 1. Unremarkable bowel gas pattern; no free intra-abdominal air seen. Small to moderate amount of stool noted in the colon. 2. No acute cardiopulmonary process seen. Electronically Signed   By: Garald Balding M.D.   On: 01/08/2017 01:37    Time Spent in minutes  30   Jani Gravel M.D on 02/06/2017 at 7:38 AM  Between 7am to 7pm - Pager - [phone]  After 7pm go to [email or web address] - password [email or web address]  [facility] -  Office  [phone]

## 2017-02-07 ENCOUNTER — Inpatient Hospital Stay (HOSPITAL_COMMUNITY): Payer: 59

## 2017-02-07 DIAGNOSIS — R1084 Generalized abdominal pain: Secondary | ICD-10-CM

## 2017-02-07 DIAGNOSIS — I2699 Other pulmonary embolism without acute cor pulmonale: Secondary | ICD-10-CM

## 2017-02-07 LAB — BPAM RBC
BLOOD PRODUCT EXPIRATION DATE: 201807232359
BLOOD PRODUCT EXPIRATION DATE: 201807232359
ISSUE DATE / TIME: 201807160932
ISSUE DATE / TIME: 201807161223
UNIT TYPE AND RH: 6200
Unit Type and Rh: 6200

## 2017-02-07 LAB — TYPE AND SCREEN
ABO/RH(D): A POS
Antibody Screen: NEGATIVE
UNIT DIVISION: 0
Unit division: 0

## 2017-02-07 LAB — COMPREHENSIVE METABOLIC PANEL
ALT: 29 U/L (ref 14–54)
AST: 35 U/L (ref 15–41)
Albumin: 1.4 g/dL — ABNORMAL LOW (ref 3.5–5.0)
Alkaline Phosphatase: 171 U/L — ABNORMAL HIGH (ref 38–126)
Anion gap: 4 — ABNORMAL LOW (ref 5–15)
BILIRUBIN TOTAL: 1.4 mg/dL — AB (ref 0.3–1.2)
BUN: 16 mg/dL (ref 6–20)
CHLORIDE: 102 mmol/L (ref 101–111)
CO2: 22 mmol/L (ref 22–32)
CREATININE: 0.47 mg/dL (ref 0.44–1.00)
Calcium: 6.7 mg/dL — ABNORMAL LOW (ref 8.9–10.3)
Glucose, Bld: 75 mg/dL (ref 65–99)
POTASSIUM: 4.1 mmol/L (ref 3.5–5.1)
Sodium: 128 mmol/L — ABNORMAL LOW (ref 135–145)
TOTAL PROTEIN: 3.6 g/dL — AB (ref 6.5–8.1)

## 2017-02-07 LAB — CBC
HEMATOCRIT: 28.2 % — AB (ref 36.0–46.0)
Hemoglobin: 9.4 g/dL — ABNORMAL LOW (ref 12.0–15.0)
MCH: 26.5 pg (ref 26.0–34.0)
MCHC: 33.3 g/dL (ref 30.0–36.0)
MCV: 79.4 fL (ref 78.0–100.0)
PLATELETS: 229 10*3/uL (ref 150–400)
RBC: 3.55 MIL/uL — ABNORMAL LOW (ref 3.87–5.11)
RDW: 18.3 % — AB (ref 11.5–15.5)
WBC: 10 10*3/uL (ref 4.0–10.5)

## 2017-02-07 MED ORDER — MORPHINE SULFATE (PF) 2 MG/ML IV SOLN
2.0000 mg | INTRAVENOUS | Status: DC | PRN
  Administered 2017-02-07 – 2017-02-09 (×3): 2 mg via INTRAVENOUS
  Filled 2017-02-07 (×3): qty 1

## 2017-02-07 MED ORDER — COBIMETINIB FUMARATE 20 MG PO TABS: 60.0000 mg | ORAL_TABLET | Freq: Every day | ORAL | Status: DC

## 2017-02-07 NOTE — Progress Notes (Signed)
Patient ID: [name], female   DOB: [birth year], 51 y.o.   MRN: [id number]                                                                PROGRESS NOTE                                                                                                                                                                                                             Patient Demographics:    [name], is a 51 y.o. female, DOB - [birth year], WUJ:[id number]  Admit date - 02/06/2017   Admitting Physician Etta Quill, DO  Outpatient Primary MD for the patient is Carol Ada, MD  LOS - 1  Outpatient Specialists:   Dr. Burr Medico (oncology)  Chief Complaint  Patient presents with  . GI Bleeding       Brief Narrative     51 y.o.femalewith medical history significant of Colon cancer (called stage 3, but has peritoneal carcinomatosis on CT scan earlier this week so I think its actually technically stage 4). Patient had colon resection with colostomy creation earlier this year in Jan.  Patient was just admitted to our service from 7/10 to 7/12 with new small PE causing chest pain. No R heart strain evident on CTA chest at that time. She was started on Xarelto and discharged.  Yesterday patient felt well, today she had nausea and dry heaves and felt generally weak throughout the day. Tonight she started having bleeding into her colostomy bag. She has filled the colostomy bag twice so far with blood. No abd pain.   ED Course:HGB 9.2 unchanged from discharge on the 12th. Hospitalist called to admit patient.  Hospital course: hgb decreased to 7.4 w active bleeding from colostomy so 2 units prbc transfused.   GI consulted recommended CT/surgery consult CT scan 7/16=>  IMPRESSION: 1. CT findings are suggestive of a distal small bowel obstruction due to enlarging infiltrative peritoneal tumor implants in the right lower quadrant. Although there is a new parastomal hernia containing a small  bowel loop adjacent to the loop colostomy in the ventral left abdominal wall, the site of small-bowel obstruction does not appear to correlate with the parastomal hernia site. 2. Progressive peritoneal carcinomatosis particularly in the ventral and right lower peritoneal cavity as  detailed . The perihepatic and perisplenic tumor implants are relatively stable. 3. Progression of liver parenchymal metastases. 4. Nonspecific prominent gallbladder distention without biliary ductal dilatation. No radiopaque cholelithiasis . 5. Moderate anasarca.  Pt seen by surgery /oncology 7/16 Oncology agreed with holding Xarelto.  LE ultrasound planned for 7/17,  If negative no IVC, if +, then IVC filter    Subjective:    [name] today has no bright red blood in her colostomy.  Abdominal discomfort has decreased,  She has brown stool in her colostomy.  CT 7/17=> SBO but pt is having stool in her colostomy and appears clinically better.   No headache, No chest pain, No abdominal pain - No Nausea, No new weakness tingling or numbness, No Cough - SOB.    Assessment  & Plan :    Principal Problem:   Lower GI bleed Active Problems:   Cancer of right colon (HCC)   Anemia, iron deficiency   Hyponatremia   Pulmonary embolus (HCC)   GI bleed   SBO NPO except meds and ice chips Await surgery input on whether needs NGT vs surgery   LGIB -blood in colostomy likely secondary to peritoneal carcinomatosis OFF Xarelto Protonix 40mg  iv bid Appreciate GI and surgery input  Anemia  S/p 2 units prbc 7/17 for Hgb 9.2=>7.4, w mild hypotension Cbc in am  Hyperkalemia resolved Check cmp tomorrow  Recent PE 7/11 OFF Xarelto in setting of significant bleed BLE [place] ordered to look for DVT If DVT present then would consult for IVC filter placement Otherwise oncology recommended no IVC filter placement for now  Hyponatremia improving Cont NS IVF Repeat cmp in am  DVT prophylaxis:None at this  time pending BLE [place] Code Status:Full Family Communication:No family in room Disposition Plan:Home after admit Consults called:None Admission status:Admit to inpatient - inpatient status for major GIB.   Lab Results  Component Value Date   PLT 229 02/07/2017      Anti-infectives    None        Objective:   Vitals:   02/06/17 1554 02/06/17 1716 02/06/17 2145 02/07/17 0405  BP: 97/66 99/65 97/69  96/63  Pulse: (!) 104 99 91 91  Resp: 18 18 18 18   Temp: 98.5 F (36.9 C) 98.2 F (36.8 C) 97.8 F (36.6 C) 98.4 F (36.9 C)  TempSrc: Oral Oral Oral Oral  SpO2: 98% 99% 100% 98%  Weight:   49.1 kg (108 lb 3.9 oz)     Wt Readings from Last 3 Encounters:  02/06/17 49.1 kg (108 lb 3.9 oz)  02/01/17 48.4 kg (106 lb 12.8 oz)  01/23/17 46.9 kg (103 lb 6.4 oz)     Intake/Output Summary (Last 24 hours) at 02/07/17 0658 Last data filed at 02/07/17 3810  Gross per 24 hour  Intake              660 ml  Output             1250 ml  Net             -590 ml     Physical Exam  Awake Alert, Oriented X 3, No new F.N deficits, Normal affect [place].AT,PERRAL Supple Neck,No JVD, No cervical lymphadenopathy appriciated.  Symmetrical Chest wall movement, Good air movement bilaterally, CTAB RRR,No Gallops,Rubs or new Murmurs, No Parasternal Heave +ve B.Sounds, Abd Soft, No tenderness, No organomegaly appriciated, No rebound - guarding or rigidity. No Cyanosis, Clubbing or edema, No new Rash or bruise   Colostomy=> brown  liquid stool, no bright red blood    Data Review:    CBC  Recent Labs Lab 01/31/17 1816 02/01/17 0750 02/02/17 0405 02/06/17 0020 02/06/17 0624 02/06/17 1818 02/07/17 0338  WBC 12.8* 9.6 12.7* 10.7* 9.7  --  10.0  HGB 10.6* 10.0* 9.2* 9.2* 7.4* 10.2* 9.4*  HCT 33.0* 30.9* 28.9* 29.8* 23.5* 30.9* 28.2*  PLT 327 309 303 346 305  --  229  MCV 78.2 78.8 79.4 79.7 79.4  --  79.4  MCH 25.1* 25.5* 25.3* 24.6* 25.0*  --  26.5  MCHC 32.1 32.4 31.8 30.9 31.5   --  33.3  RDW 17.8* 18.2* 18.3* 20.0* 19.7*  --  18.3*  LYMPHSABS 2.4  --   --  2.0  --   --   --   MONOABS 1.0  --   --  1.1*  --   --   --   EOSABS 0.1  --   --  0.0  --   --   --   BASOSABS 0.0  --   --  0.0  --   --   --     Chemistries   Recent Labs Lab 01/31/17 1816 02/01/17 0750 02/06/17 0020 02/06/17 0624 02/07/17 0338  NA 126* 130* 125* 125* 128*  K 4.0 3.8 5.4* 4.8 4.1  CL 96* 99* 92* 95* 102  CO2 22 23 24 23 22   GLUCOSE 91 79 99 88 75  BUN 16 12 24* 24* 16  CREATININE 0.44 0.43* 0.72 0.61 0.47  CALCIUM 7.9* 7.6* 6.7* 6.3* 6.7*  AST  --  17 88*  --  35  ALT  --  14 47  --  29  ALKPHOS  --  158* 233*  --  171*  BILITOT  --  0.8 0.9  --  1.4*   ------------------------------------------------------------------------------------------------------------------ No results for input(s): CHOL, HDL, LDLCALC, TRIG, CHOLHDL, LDLDIRECT in the last 72 hours.  Lab Results  Component Value Date   HGBA1C 5.7 (H) 07/29/2015   ------------------------------------------------------------------------------------------------------------------ No results for input(s): TSH, T4TOTAL, T3FREE, THYROIDAB in the last 72 hours.  Invalid input(s): FREET3 ------------------------------------------------------------------------------------------------------------------ No results for input(s): VITAMINB12, FOLATE, FERRITIN, TIBC, IRON, RETICCTPCT in the last 72 hours.  Coagulation profile  Recent Labs Lab 02/01/17 0114 02/01/17 1841  INR 2.24 2.01    No results for input(s): DDIMER in the last 72 hours.  Cardiac Enzymes  Recent Labs Lab 02/01/17 0248 02/01/17 0750 02/01/17 1517  TROPONINI <0.03 <0.03 <0.03   ------------------------------------------------------------------------------------------------------------------ No results found for: BNP  Inpatient Medications  Scheduled Meds: . cobimetinib fumarate  60 mg Oral Daily  . morphine  30 mg Oral QHS  .  pantoprazole (PROTONIX) IV  40 mg Intravenous Q12H  . sodium chloride flush  3 mL Intravenous Q12H   Continuous Infusions: . sodium chloride 125 mL/hr at 02/07/17 0420   PRN Meds:.acetaminophen **OR** acetaminophen, ALPRAZolam, cyclobenzaprine, morphine injection, ondansetron **OR** ondansetron (ZOFRAN) IV, oxyCODONE, prochlorperazine, sodium chloride flush, traMADol  Micro Results No results found for this or any previous visit (from the past 240 hour(s)).  Radiology Reports Dg Chest 2 View  Result Date: 01/31/2017 CLINICAL DATA:  51 y/o  F; chest pain. EXAM: CHEST  2 VIEW COMPARISON:  01/08/2017 chest radiograph.  12/22/2016 chest CT there FINDINGS: Stable normal cardiac silhouette. Right port catheter tip projects over lower SVC. No consolidation, effusion, or pneumothorax. Pulmonary nodules on CT are not visualized radiographically. No acute osseous abnormality is evident. IMPRESSION: No acute pulmonary process identified.  Electronically Signed   By: Kristine Garbe M.D.   On: 01/31/2017 18:33   Ct Angio Chest Pe W And/or Wo Contrast  Result Date: 02/01/2017 CLINICAL DATA:  Chest pressure for 3 days EXAM: CT ANGIOGRAPHY CHEST WITH CONTRAST TECHNIQUE: Multidetector CT imaging of the chest was performed using the standard protocol during bolus administration of intravenous contrast. Multiplanar CT image reconstructions and MIPs were obtained to evaluate the vascular anatomy. CONTRAST:  55 mL Isovue 370 intravenous COMPARISON:  Radiograph 01/31/2017, CT chest 12/22/2016, 10/05/2016 FINDINGS: Cardiovascular: Satisfactory opacification of the pulmonary arteries to the segmental level. Small nonocclusive filling defect within a left lower lobe segmental/subsegmental pulmonary artery branch vessel. RV LV ratio is normal. No other filling defects are visualized. Non aneurysmal aorta. No dissection. Nonenlarged heart. No pericardial effusion. Right-sided central venous port tip within the SVC.  Minimal atherosclerosis. Mediastinum/Nodes: No significantly enlarged mediastinal lymph nodes. Midline trachea. No thyroid mass. Esophagus within normal limits. Lungs/Pleura: Multiple bilateral pulmonary nodules are again visualized. Index right middle lobe pulmonary nodule, series 7, image number 44, measures 7 mm compared to 5 mm previously. Index left upper lobe pulmonary nodule, series 7, image number 37 measures 8 mm, compared with 6 mm previously. No pleural effusion. No acute consolidation. Upper Abdomen: Peritoneal carcinomatosis with scalloped appearance of the liver and spleen. Calcifications within the low-density surrounding the liver. Heterogenous splenic enhancement likely due to arterial phase imaging. Dilated bowel in the upper abdomen. Postsurgical changes of the stomach. Dilated gallbladder measuring up to 4.6 cm. Musculoskeletal: No acute or suspicious bone lesion Review of the MIP images confirms the above findings. IMPRESSION: 1. Positive for small nonocclusive embolus in a left lower lobe segmental/subsegmental branch vessels. No elevation of the RV LV ratio. No other filling defects are seen. 2. Multiple bilateral pulmonary nodules. Slight increased size of several pulmonary nodules as described above. 3. Peritoneal carcinomatosis partially imaged. 4. Dilated bowel in the upper abdomen, could relate to a bowel obstruction 5. Dilated gallbladder. Critical Value/emergent results were called by telephone at the time of interpretation on 02/01/2017 at 12:37 am to [place] who assumed care for the patient for [facility] , who verbally acknowledged these results. Aortic Atherosclerosis (ICD10-I70.0). Electronically Signed   By: Donavan Foil M.D.   On: 02/01/2017 00:38   Ct Abdomen Pelvis W Contrast  Result Date: 02/06/2017 CLINICAL DATA:  Generalized abdominal pain. GI bleed. Stage IV metastatic cecal colon cancer. Ileus CK ectomy January 2017. Transverse loop colostomy 10/13/2016. Inpatient.  Admitted with acute pulmonary embolus, placed on Xarelto anticoagulation. EXAM: CT ABDOMEN AND PELVIS WITH CONTRAST TECHNIQUE: Multidetector CT imaging of the abdomen and pelvis was performed using the standard protocol following bolus administration of intravenous contrast. CONTRAST:  100 cc ISOVUE-300 IOPAMIDOL (ISOVUE-300) INJECTION 61% COMPARISON:  12/22/2016 CT abdomen.  10/09/2016 CT abdomen/pelvis. FINDINGS: Lower chest: Re- demonstration of tiny pulmonary embolus in the left lower lobe (series 3/ image 2), not appreciably changed. Tip of superior approach central venous catheter is seen at the right cavoatrial junction. Hepatobiliary: There are several (at least 5) hypodense parenchymal liver masses scattered throughout the liver, all increased in size, largest 1.2 cm adjacent to the IVC (series 3/ image 21), previously 0.7 cm. Scalloping of the liver capsule by numerous perihepatic space calcified peritoneal implants is not appreciably changed, largest 2.8 cm thickness in the anterior liver (series 3/image 23), previously 2.7 cm. Prominently distended gallbladder (5.0 cm diameter). No radiopaque cholelithiasis. No definite gallbladder wall thickening. No biliary ductal dilatation. Common  bile duct diameter 4 mm. Pancreas: Normal, with no mass or duct dilation. Spleen: Normal size spleen. Stable scalloping of the splenic capsule by peritoneal implants, largest 1.7 cm superiorly (series 3/ image 17). Adrenals/Urinary Tract: No discrete adrenal nodules. No hydronephrosis. No renal masses. Relatively collapsed bladder contains a small amount of excreted contrast and is otherwise normal. Stomach/Bowel: Stable postsurgical changes from gastric bypass surgery with intact appearing gastrojejunostomy and relatively collapsed excluded distal stomach. Patient is status post ileocecal resection with neo ileocolic anastomosis in the central abdomen. Patient is status post transverse loop colostomy in the ventral left  abdominal wall. 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 There is prominent dilation of small bowel loops throughout the abdomen measuring up to 6.3 cm diameter, although the site of small bowel caliber transition appears to be in the right pelvis at the site of multiple infiltrative enlarging peritoneal tumor implants and not at the site of the parastomal hernia. Oral contrast traverses to the mid to distal small bowel. There is retained barium in the collapsed distal large-bowel. Ileocolic anastomosis appears intact. Vascular/Lymphatic: Normal caliber abdominal aorta. Patent hepatic, portal, splenic and renal veins . No pathologically enlarged lymph nodes in the abdomen or pelvis. Reproductive: Status post hysterectomy, with no abnormal findings at the vaginal cuff. Other: No pneumoperitoneum. Interval growth of bulky peritoneal tumor implants in the ventral and right lower peritoneal cavity. For example a 7.7 x 5.5 cm right lower quadrant implant (series 3/ image 55), previously 7.1 x 4.6 cm on 12/22/2016 using similar measurement technique, increased. A 7.0 x 5.1 cm anterior peritoneal implant (series 3/ image 40), previously 5.1 x 4.0 cm, increased. Moderate anasarca. Musculoskeletal: No aggressive appearing focal osseous lesions. IMPRESSION: 1. CT findings are suggestive of a distal small bowel obstruction due to enlarging infiltrative peritoneal tumor implants in the right lower quadrant. Although there is a new parastomal hernia containing a small bowel loop adjacent to the loop colostomy in the ventral left abdominal wall, the site of small-bowel obstruction does not appear to correlate with the parastomal hernia site. 2. Progressive peritoneal carcinomatosis particularly in the ventral and right lower peritoneal cavity as detailed . The perihepatic and perisplenic tumor implants are relatively stable. 3. Progression of liver parenchymal metastases.  4. Nonspecific prominent gallbladder distention without biliary ductal dilatation. No radiopaque cholelithiasis . 5. Moderate anasarca. Electronically Signed   By: Ilona Sorrel M.D.   On: 02/06/2017 21:41    Time Spent in minutes  30   Jani Gravel M.D on 02/07/2017 at 6:58 AM  Between 7am to 7pm - Pager - [phone]  After 7pm go to [email or web address] - password [email or web address]  [facility] -  Office  [phone]

## 2017-02-07 NOTE — Progress Notes (Signed)
  Progress Note: General Surgery Service   Assessment/Plan: Patient Active Problem List   Diagnosis Date Noted  . Lower GI bleed 02/06/2017  . GI bleed 02/06/2017  . PE (pulmonary thromboembolism) ([place]) 02/01/2017  . Anxiety 02/01/2017  . Protein-calorie malnutrition, severe ([place]) 02/01/2017  . Pulmonary embolism ([place]) 02/01/2017  . Pulmonary embolus ([place])   . Goals of care, counseling/discussion 11/04/2016  . Abdominal pain, generalized 10/10/2016  . UTI (urinary tract infection) 10/10/2016  . Hyperglycemia 10/10/2016  . Uncontrolled pain 10/10/2016  . Peritoneal carcinomatosis ([place]) 10/10/2016  . Malignant ascites 10/10/2016  . Malignant neoplasm of colon ([place])   . SBO (small bowel obstruction) ([place])   . Port catheter in place 06/23/2016  . Hand foot syndrome 01/08/2016  . Open abdominal wall wound 10/12/2015  . Hyperphosphatemia 09/22/2015  . Hypoalbuminemia due to protein-calorie malnutrition ([place]) 09/22/2015  . Insomnia 09/22/2015  . Dehydration 09/21/2015  . Hyponatremia 09/21/2015  . Hypotension 09/21/2015  . Transaminitis 09/21/2015  . Anemia, iron deficiency 09/19/2015  . Cancer of right colon ([place]) 09/04/2015  . S/P colectomy 08/03/2015  . Perforated appendicitis 04/02/2015  . Appendiceal abscess    Hemoglobin 9.4 from 10 F/u GI recs Ok for liquids from my standpoint   LOS: 1 day  Chief Complaint/Subjective: Ostomy output has slowed, no pain this morning  Objective: Vital signs in last 24 hours: Temp:  [97.5 F (36.4 C)-98.5 F (36.9 C)] 98.4 F (36.9 C) (07/17 0405) Pulse Rate:  [91-116] 91 (07/17 0405) Resp:  [16-18] 18 (07/17 0405) BP: (92-99)/(63-71) 96/63 (07/17 0405) SpO2:  [98 %-100 %] 98 % (07/17 0405) Weight:  [49.1 kg (108 lb 3.9 oz)] 49.1 kg (108 lb 3.9 oz) (07/16 2145) Last BM Date: 02/06/17  Intake/Output from previous day: 07/16 0701 - 07/17 0700 In: 660 [Blood:660] Out: 1250 [Stool:1250] Intake/Output this shift: No intake/output  data recorded.  Gen: NAD  Abd: soft, NT, ostomy pink and patent  Extremities: no edema  Neuro: AOx4  Lab Results: CBC   Recent Labs  02/06/17 0624 02/06/17 1818 02/07/17 0338  WBC 9.7  --  10.0  HGB 7.4* 10.2* 9.4*  HCT 23.5* 30.9* 28.2*  PLT 305  --  229   BMET  Recent Labs  02/06/17 0624 02/07/17 0338  NA 125* 128*  K 4.8 4.1  CL 95* 102  CO2 23 22  GLUCOSE 88 75  BUN 24* 16  CREATININE 0.61 0.47  CALCIUM 6.3* 6.7*   PT/INR No results for input(s): LABPROT, INR in the last 72 hours. ABG No results for input(s): PHART, HCO3 in the last 72 hours.  Invalid input(s): PCO2, PO2  Studies/Results:  Anti-infectives: Anti-infectives    None      Medications: Scheduled Meds: . cobimetinib fumarate  60 mg Oral Daily  . morphine  30 mg Oral QHS  . pantoprazole (PROTONIX) IV  40 mg Intravenous Q12H  . sodium chloride flush  3 mL Intravenous Q12H   Continuous Infusions: . sodium chloride 125 mL/hr at 02/07/17 0420   PRN Meds:.acetaminophen **OR** acetaminophen, ALPRAZolam, cyclobenzaprine, morphine injection, ondansetron **OR** ondansetron (ZOFRAN) IV, oxyCODONE, prochlorperazine, sodium chloride flush, traMADol  Mickeal Skinner, MD Pg# [phone] [facility]

## 2017-02-07 NOTE — Progress Notes (Signed)
VASCULAR LAB PRELIMINARY  PRELIMINARY  PRELIMINARY  PRELIMINARY  Bilateral lower extremity venous duplex completed.    Preliminary report:  Bilateral:  No evidence of DVT, superficial thrombosis, or Baker's Cyst.   Ranata Laughery, RVS 02/07/2017, 11:37 AM

## 2017-02-07 NOTE — Plan of Care (Signed)
Problem: Pain Managment: Goal: General experience of comfort will improve Outcome: Progressing Pt. NPO and called for IV pain med.

## 2017-02-07 NOTE — Progress Notes (Signed)
[facility] Gastroenterology Progress Note  [name] 51 y.o. 1966/03/09   Subjective: Feels better. Denies abdominal pain. Brown stool in ostomy.  Objective: Vital signs in last 24 hours: Vitals:   02/07/17 0405 02/07/17 0745  BP: 96/63 101/70  Pulse: 91 89  Resp: 18 16  Temp: 98.4 F (36.9 C) 97.8 F (36.6 C)    Physical Exam: Gen: lethargic, thin, no acute distress CV: RRR Chest: CTA B Abd: right-sided tenderness with guarding, soft, nondistended, +BS, brown stool in ostomy bag Ext: no edema  Lab Results:  Recent Labs  02/06/17 0624 02/07/17 0338  NA 125* 128*  K 4.8 4.1  CL 95* 102  CO2 23 22  GLUCOSE 88 75  BUN 24* 16  CREATININE 0.61 0.47  CALCIUM 6.3* 6.7*    Recent Labs  02/06/17 0020 02/07/17 0338  AST 88* 35  ALT 47 29  ALKPHOS 233* 171*  BILITOT 0.9 1.4*  PROT 5.1* 3.6*  ALBUMIN 1.9* 1.4*    Recent Labs  02/06/17 0020 02/06/17 0624 02/06/17 1818 02/07/17 0338  WBC 10.7* 9.7  --  10.0  NEUTROABS 7.6  --   --   --   HGB 9.2* 7.4* 10.2* 9.4*  HCT 29.8* 23.5* 30.9* 28.2*  MCV 79.7 79.4  --  79.4  PLT 346 305  --  229   No results for input(s): LABPROT, INR in the last 72 hours.    Assessment/Plan: Metastatic colon cancer with peritoneal implants causing a GI bleed that appears to have resolved. Distal SBO from peritoneal implants in RLQ on CT scan. 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 Anticoagulation on hold. No role for endoscopic management. Clear liquid diet. Will sign off. Call if questions.   [place] C. 02/07/2017, 2:09 PM   Pager [phone]  AFTER 5 pm or on weekends call [phone] ID: [name], female   DOB: 10-08-1965, 51 y.o.   MRN: [id number]

## 2017-02-07 NOTE — Progress Notes (Addendum)
Spoke with Maryelizabeth Rowan, PharmD, BCCOP at [facility] outpatient cancer center who then spoke with Dr. Burr Medico.  Patient is to HOLD cobimetinib (Cotellic) until her followup appointment on 7/30 with Dr. Burr Medico. She has not been able to receive her immunotherapy infusion for the past few weeks. In addition, hemorrhage is a risk with cobimetinib.  Spoke with patient to relay this information. She understands and is in agreement.  Order discontinued in [place].  Rober Skeels D. Hether Anselmo, PharmD, BCPS Clinical Pharmacist Pager: [phone] Clinical Phone for 02/07/2017 until 3:30pm: [phone] If after 3:30pm, please call main pharmacy at [phone] 02/07/2017 1:35 PM

## 2017-02-07 NOTE — Progress Notes (Signed)
Pt./fiance wanted pt. to have clear liquids- explained to both that pt. Having liquids depend on results of CT scan and have to have an order from MD; results back for CT scan and page K. Schorr,NP and RTC- she wanted RN to call GI MD; 2230 paged GI MD on call for Dr. Michail Sermon and Dr. Oletta Lamas RTC- and stated to keep pt. NPO after RN read him results of CT scan, and Dr. Michail Sermon will address further in AM. Pt./fiance informed of what both NP and MD said; green sponges moisten give to pt. to moisten mouth. K. Schorr,NP informed about pt. needing pain med IV.

## 2017-02-08 LAB — CBC
HEMATOCRIT: 30.8 % — AB (ref 36.0–46.0)
Hemoglobin: 10.2 g/dL — ABNORMAL LOW (ref 12.0–15.0)
MCH: 26.6 pg (ref 26.0–34.0)
MCHC: 33.1 g/dL (ref 30.0–36.0)
MCV: 80.4 fL (ref 78.0–100.0)
PLATELETS: 220 10*3/uL (ref 150–400)
RBC: 3.83 MIL/uL — AB (ref 3.87–5.11)
RDW: 18.8 % — ABNORMAL HIGH (ref 11.5–15.5)
WBC: 9.2 10*3/uL (ref 4.0–10.5)

## 2017-02-08 MED ORDER — SIMETHICONE 80 MG PO CHEW
80.0000 mg | CHEWABLE_TABLET | Freq: Four times a day (QID) | ORAL | Status: DC | PRN
  Administered 2017-02-08 – 2017-02-09 (×2): 80 mg via ORAL
  Filled 2017-02-08 (×2): qty 1

## 2017-02-08 NOTE — Progress Notes (Signed)
Patient ID: [name], female   DOB: 02-26-1966, 51 y.o.   MRN: [id number]  [facility] Surgery Progress Note     Subjective: CC- GI bleed Feeling well this morning. Denies any current abdominal pain. Tolerating clear liquids. Denies n/v. Continues to have good output from colostomy. Denies any visible blood in stool.  Objective: Vital signs in last 24 hours: Temp:  [98.1 F (36.7 C)-99.1 F (37.3 C)] 98.5 F (36.9 C) (07/18 0533) Pulse Rate:  [101-110] 110 (07/18 0533) Resp:  [18] 18 (07/18 0533) BP: (104-117)/(69-79) 104/69 (07/18 0533) SpO2:  [97 %-100 %] 98 % (07/18 0533) Weight:  [108 lb 8 oz (49.2 kg)] 108 lb 8 oz (49.2 kg) (07/17 2058) Last BM Date: 02/07/17  Intake/Output from previous day: 07/17 0701 - 07/18 0700 In: 2448.4 [P.O.:360; I.V.:2088.4] Out: 100 [Stool:100] Intake/Output this shift: No intake/output data recorded.  PE: Gen:  Alert, NAD, pleasant HEENT: EOM's intact, pupils equal  Card:  tachy Pulm:  CTAB, no W/R/R, effort normal Abd: Soft, NT/ND, +BS, ostomy pink and edematous, liquid brown stool in bag Psych: A&Ox3  Skin: no rashes noted, warm and dry  Lab Results:   Recent Labs  02/06/17 0624 02/06/17 1818 02/07/17 0338  WBC 9.7  --  10.0  HGB 7.4* 10.2* 9.4*  HCT 23.5* 30.9* 28.2*  PLT 305  --  229   BMET  Recent Labs  02/06/17 0624 02/07/17 0338  NA 125* 128*  K 4.8 4.1  CL 95* 102  CO2 23 22  GLUCOSE 88 75  BUN 24* 16  CREATININE 0.61 0.47  CALCIUM 6.3* 6.7*   PT/INR No results for input(s): LABPROT, INR in the last 72 hours. CMP     Component Value Date/Time   NA 128 (L) 02/07/2017 0338   NA 136 01/23/2017 1333   K 4.1 02/07/2017 0338   K 4.1 01/23/2017 1333   CL 102 02/07/2017 0338   CO2 22 02/07/2017 0338   CO2 32 (H) 01/23/2017 1333   GLUCOSE 75 02/07/2017 0338   GLUCOSE 102 01/23/2017 1333   BUN 16 02/07/2017 0338   BUN 17.1 01/23/2017 1333   CREATININE 0.47 02/07/2017 0338   CREATININE 0.6  01/23/2017 1333   CALCIUM 6.7 (L) 02/07/2017 0338   CALCIUM 8.9 01/23/2017 1333   PROT 3.6 (L) 02/07/2017 0338   PROT 6.9 01/23/2017 1333   ALBUMIN 1.4 (L) 02/07/2017 0338   ALBUMIN 2.5 (L) 01/23/2017 1333   AST 35 02/07/2017 0338   AST 13 01/23/2017 1333   ALT 29 02/07/2017 0338   ALT 8 01/23/2017 1333   ALKPHOS 171 (H) 02/07/2017 0338   ALKPHOS 108 01/23/2017 1333   BILITOT 1.4 (H) 02/07/2017 0338   BILITOT 0.23 01/23/2017 1333   GFRNONAA >60 02/07/2017 0338   GFRAA >60 02/07/2017 0338   Lipase     Component Value Date/Time   LIPASE <10 (L) 10/09/2016 1628       Studies/Results: Ct Abdomen Pelvis W Contrast  Result Date: 02/06/2017 CLINICAL DATA:  Generalized abdominal pain. GI bleed. Stage IV metastatic cecal colon cancer. Ileus CK ectomy January 2017. Transverse loop colostomy 10/13/2016. Inpatient. Admitted with acute pulmonary embolus, placed on Xarelto anticoagulation. EXAM: CT ABDOMEN AND PELVIS WITH CONTRAST TECHNIQUE: Multidetector CT imaging of the abdomen and pelvis was performed using the standard protocol following bolus administration of intravenous contrast. CONTRAST:  100 cc ISOVUE-300 IOPAMIDOL (ISOVUE-300) INJECTION 61% COMPARISON:  12/22/2016 CT abdomen.  10/09/2016 CT abdomen/pelvis. FINDINGS: Lower chest: Re- demonstration of  tiny pulmonary embolus in the left lower lobe (series 3/ image 2), not appreciably changed. Tip of superior approach central venous catheter is seen at the right cavoatrial junction. Hepatobiliary: There are several (at least 5) hypodense parenchymal liver masses scattered throughout the liver, all increased in size, largest 1.2 cm adjacent to the IVC (series 3/ image 21), previously 0.7 cm. Scalloping of the liver capsule by numerous perihepatic space calcified peritoneal implants is not appreciably changed, largest 2.8 cm thickness in the anterior liver (series 3/image 23), previously 2.7 cm. Prominently distended gallbladder (5.0 cm  diameter). No radiopaque cholelithiasis. No definite gallbladder wall thickening. No biliary ductal dilatation. Common bile duct diameter 4 mm. Pancreas: Normal, with no mass or duct dilation. Spleen: Normal size spleen. Stable scalloping of the splenic capsule by peritoneal implants, largest 1.7 cm superiorly (series 3/ image 17). Adrenals/Urinary Tract: No discrete adrenal nodules. No hydronephrosis. No renal masses. Relatively collapsed bladder contains a small amount of excreted contrast and is otherwise normal. Stomach/Bowel: Stable postsurgical changes from gastric bypass surgery with intact appearing gastrojejunostomy and relatively collapsed excluded distal stomach. Patient is status post ileocecal resection with neo ileocolic anastomosis in the central abdomen. Patient is status post transverse loop colostomy in the ventral left abdominal wall. 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 There is prominent dilation of small bowel loops throughout the abdomen measuring up to 6.3 cm diameter, although the site of small bowel caliber transition appears to be in the right pelvis at the site of multiple infiltrative enlarging peritoneal tumor implants and not at the site of the parastomal hernia. Oral contrast traverses to the mid to distal small bowel. There is retained barium in the collapsed distal large-bowel. Ileocolic anastomosis appears intact. Vascular/Lymphatic: Normal caliber abdominal aorta. Patent hepatic, portal, splenic and renal veins . No pathologically enlarged lymph nodes in the abdomen or pelvis. Reproductive: Status post hysterectomy, with no abnormal findings at the vaginal cuff. Other: No pneumoperitoneum. Interval growth of bulky peritoneal tumor implants in the ventral and right lower peritoneal cavity. For example a 7.7 x 5.5 cm right lower quadrant implant (series 3/ image 55), previously 7.1 x 4.6 cm on 12/22/2016 using similar  measurement technique, increased. A 7.0 x 5.1 cm anterior peritoneal implant (series 3/ image 40), previously 5.1 x 4.0 cm, increased. Moderate anasarca. Musculoskeletal: No aggressive appearing focal osseous lesions. IMPRESSION: 1. CT findings are suggestive of a distal small bowel obstruction due to enlarging infiltrative peritoneal tumor implants in the right lower quadrant. Although there is a new parastomal hernia containing a small bowel loop adjacent to the loop colostomy in the ventral left abdominal wall, the site of small-bowel obstruction does not appear to correlate with the parastomal hernia site. 2. Progressive peritoneal carcinomatosis particularly in the ventral and right lower peritoneal cavity as detailed . The perihepatic and perisplenic tumor implants are relatively stable. 3. Progression of liver parenchymal metastases. 4. Nonspecific prominent gallbladder distention without biliary ductal dilatation. No radiopaque cholelithiasis . 5. Moderate anasarca. Electronically Signed   By: Ilona Sorrel M.D.   On: 02/06/2017 21:41    Anti-infectives: Anti-infectives    None       Assessment/Plan Recent PE 7/11 - holding xarelto due to bleed  Colon cancer s/p right colectomy 08/03/2015 and loop colostomy 10/13/2016 GI bleed - per GI bleed has stopped, on clear liquids 7/17 and protonix - labs pending this AM; mild tachy HR 101-110, BP stable -  tolerating clear liquids and having output from colostomy - CBC pending. Advance to full liquids. Encourage ambulation. Add simethicone for gas pains.  ID - none FEN - full liquids VTE - SCDs   LOS: 2 days    Jerrye Beavers , [facility] Surgery 02/08/2017, 8:47 AM Pager: [phone] Consults: [phone] Mon-Fri 7:00 am-4:30 pm Sat-Sun 7:00 am-11:30 am

## 2017-02-08 NOTE — Progress Notes (Signed)
Patient ID: [name], female   DOB: 06/11/1966, 51 y.o.   MRN: [id number]                                                                PROGRESS NOTE                                                                                                                                                                                                             Patient Demographics:    [name], is a 51 y.o. female, DOB - 06/24/1966, VQQ:[id number]  Admit date - 02/06/2017   Admitting Physician Etta Quill, DO  Outpatient Primary MD for the patient is Carol Ada, MD  LOS - 2  Outpatient Specialists:   Dr. Burr Medico (oncology)  Chief Complaint  Patient presents with  . GI Bleeding       Brief Narrative     51 y.o.femalewith medical history significant of Colon cancer (called stage 3, but has peritoneal carcinomatosis on CT scan earlier this week so I think its actually technically stage 4). Patient had colon resection with colostomy creation earlier this year in Jan.  Patient was just admitted to our service from 7/10 to 7/12 with new small PE causing chest pain. No R heart strain evident on CTA chest at that time. She was started on Xarelto and discharged.  Yesterday patient felt well, today she had nausea and dry heaves and felt generally weak throughout the day. Tonight she started having bleeding into her colostomy bag. She has filled the colostomy bag twice so far with blood. No abd pain.   ED Course:HGB 9.2 unchanged from discharge on the 12th. Hospitalist called to admit patient.  Hospital course: hgb decreased to 7.4 w active bleeding from colostomy so 2 units prbc transfused.   GI consulted recommended CT/surgery consult CT scan 7/16=>  IMPRESSION: 1. CT findings are suggestive of a distal small bowel obstruction due to enlarging infiltrative peritoneal tumor implants in the right lower quadrant. Although there is a new parastomal hernia containing a small  bowel loop adjacent to the loop colostomy in the ventral left abdominal wall, the site of small-bowel obstruction does not appear to correlate with the parastomal hernia site. 2. Progressive peritoneal carcinomatosis particularly in the ventral and right lower peritoneal cavity as  detailed . The perihepatic and perisplenic tumor implants are relatively stable. 3. Progression of liver parenchymal metastases. 4. Nonspecific prominent gallbladder distention without biliary ductal dilatation. No radiopaque cholelithiasis . 5. Moderate anasarca.  Pt seen by surgery /oncology 7/16 Oncology agreed with holding Xarelto.  LE ultrasound planned for 7/17,  If negative no IVC, if +, then IVC filter    Subjective:    [name] today has no bright red blood in her colostomy.  Abdominal discomfort has decreased,  She has brown stool in her colostomy.  CT 7/17=> SBO but pt is having stool in her colostomy and appears clinically better.   No headache, No chest pain, No abdominal pain - No Nausea, No new weakness tingling or numbness, No Cough - SOB.    Assessment  & Plan :    Principal Problem:   Lower GI bleed Active Problems:   Cancer of right colon (HCC)   Anemia, iron deficiency   Hyponatremia   Pulmonary embolus (HCC)   GI bleed   SBO Gen. surgery was consulted. Patient is actually advancing well. Gen. surgery recommended to advance the diet and patient is currently tolerating soft diet. We will monitor overnight before discharging the patient due to advance carcinomatosis as well as presence of hernia around the colostomy.  LGIB -blood in colostomy likely secondary to peritoneal carcinomatosis OFF Xarelto GI consulted. No further inpatient workup recommended at present. Per recommendations from oncology divorced like to start the patient on Xarelto 10-15 mg dose 3-4 days after the bleeding is stopped. Awaiting callback from hematology regarding discharge recommendation with  anticoagulation.  Anemia  S/p 2 units prbc 7/17 for Hgb 9.2=>7.4, w mild hypotension Daily CBC. Hemoglobin stabilized.  Hyperkalemia resolved Monitor daily BMP  Recent PE 7/11 OFF Xarelto in setting of significant bleed BLE [place] negative for DVT. Per recommendation from hematology no indication for IVC filter at present.  Hyponatremia improving Cont NS IVF Repeat cmp in am  DVT prophylaxis:Compression stockings Code Status:Full Family Communication:No family in room Disposition Plan:Home after discharge Consults called: Hematology, GI, general surgery Admission status:Admit to inpatient - inpatient status for major GIB.   Lab Results  Component Value Date   PLT 220 02/08/2017      Anti-infectives    None        Objective:   Vitals:   02/07/17 1550 02/07/17 2058 02/08/17 0533 02/08/17 0749  BP: 117/79 109/78 104/69 109/77  Pulse: (!) 109 (!) 101 (!) 110 (!) 124  Resp: 18 18 18 16   Temp: 99.1 F (37.3 C) 98.1 F (36.7 C) 98.5 F (36.9 C) 98 F (36.7 C)  TempSrc: Oral Oral Oral Oral  SpO2: 100% 97% 98% 99%  Weight:  49.2 kg (108 lb 8 oz)    Height:  5' (1.524 m)      Wt Readings from Last 3 Encounters:  02/07/17 49.2 kg (108 lb 8 oz)  02/01/17 48.4 kg (106 lb 12.8 oz)  01/23/17 46.9 kg (103 lb 6.4 oz)     Intake/Output Summary (Last 24 hours) at 02/08/17 1658 Last data filed at 02/08/17 1000  Gross per 24 hour  Intake          1375.91 ml  Output              100 ml  Net          1275.91 ml     Physical Exam  Awake Alert, Oriented X 3, No new F.N deficits, Normal affect [place].AT,PERRAL  Supple Neck,No JVD, No cervical lymphadenopathy appriciated.  Symmetrical Chest wall movement, Good air movement bilaterally, CTAB RRR,No Gallops,Rubs or new Murmurs, No Parasternal Heave +ve B.Sounds, Abd Soft, No tenderness, No organomegaly appriciated, No rebound - guarding or rigidity. No Cyanosis, Clubbing or edema, No new Rash or bruise     Colostomy=> brown liquid stool, no bright red blood    Data Review:    CBC  Recent Labs Lab 02/02/17 0405 02/06/17 0020 02/06/17 0624 02/06/17 1818 02/07/17 0338 02/08/17 1007  WBC 12.7* 10.7* 9.7  --  10.0 9.2  HGB 9.2* 9.2* 7.4* 10.2* 9.4* 10.2*  HCT 28.9* 29.8* 23.5* 30.9* 28.2* 30.8*  PLT 303 346 305  --  229 220  MCV 79.4 79.7 79.4  --  79.4 80.4  MCH 25.3* 24.6* 25.0*  --  26.5 26.6  MCHC 31.8 30.9 31.5  --  33.3 33.1  RDW 18.3* 20.0* 19.7*  --  18.3* 18.8*  LYMPHSABS  --  2.0  --   --   --   --   MONOABS  --  1.1*  --   --   --   --   EOSABS  --  0.0  --   --   --   --   BASOSABS  --  0.0  --   --   --   --     Chemistries   Recent Labs Lab 02/06/17 0020 02/06/17 0624 02/07/17 0338  NA 125* 125* 128*  K 5.4* 4.8 4.1  CL 92* 95* 102  CO2 24 23 22   GLUCOSE 99 88 75  BUN 24* 24* 16  CREATININE 0.72 0.61 0.47  CALCIUM 6.7* 6.3* 6.7*  AST 88*  --  35  ALT 47  --  29  ALKPHOS 233*  --  171*  BILITOT 0.9  --  1.4*   ------------------------------------------------------------------------------------------------------------------ No results for input(s): CHOL, HDL, LDLCALC, TRIG, CHOLHDL, LDLDIRECT in the last 72 hours.  Lab Results  Component Value Date   HGBA1C 5.7 (H) 07/29/2015   ------------------------------------------------------------------------------------------------------------------ No results for input(s): TSH, T4TOTAL, T3FREE, THYROIDAB in the last 72 hours.  Invalid input(s): FREET3 ------------------------------------------------------------------------------------------------------------------ No results for input(s): VITAMINB12, FOLATE, FERRITIN, TIBC, IRON, RETICCTPCT in the last 72 hours.  Coagulation profile  Recent Labs Lab 02/01/17 1841  INR 2.01    No results for input(s): DDIMER in the last 72 hours.  Cardiac Enzymes No results for input(s): CKMB, TROPONINI, MYOGLOBIN in the last 168 hours.  Invalid input(s):  CK ------------------------------------------------------------------------------------------------------------------ No results found for: BNP  Inpatient Medications  Scheduled Meds: . morphine  30 mg Oral QHS  . pantoprazole (PROTONIX) IV  40 mg Intravenous Q12H  . sodium chloride flush  3 mL Intravenous Q12H   Continuous Infusions: . sodium chloride 125 mL/hr at 02/08/17 0709   PRN Meds:.acetaminophen **OR** acetaminophen, ALPRAZolam, cyclobenzaprine, morphine injection, ondansetron **OR** ondansetron (ZOFRAN) IV, oxyCODONE, prochlorperazine, simethicone, sodium chloride flush, traMADol  Micro Results No results found for this or any previous visit (from the past 240 hour(s)).  Radiology Reports Dg Chest 2 View  Result Date: 01/31/2017 CLINICAL DATA:  51 y/o  F; chest pain. EXAM: CHEST  2 VIEW COMPARISON:  01/08/2017 chest radiograph.  12/22/2016 chest CT there FINDINGS: Stable normal cardiac silhouette. Right port catheter tip projects over lower SVC. No consolidation, effusion, or pneumothorax. Pulmonary nodules on CT are not visualized radiographically. No acute osseous abnormality is evident. IMPRESSION: No acute pulmonary process identified. Electronically Signed   By: Mia [place]  [place] M.D.   On: 01/31/2017 18:33   Ct Angio Chest Pe W And/or Wo Contrast  Result Date: 02/01/2017 CLINICAL DATA:  Chest pressure for 3 days EXAM: CT ANGIOGRAPHY CHEST WITH CONTRAST TECHNIQUE: Multidetector CT imaging of the chest was performed using the standard protocol during bolus administration of intravenous contrast. Multiplanar CT image reconstructions and MIPs were obtained to evaluate the vascular anatomy. CONTRAST:  55 mL Isovue 370 intravenous COMPARISON:  Radiograph 01/31/2017, CT chest 12/22/2016, 10/05/2016 FINDINGS: Cardiovascular: Satisfactory opacification of the pulmonary arteries to the segmental level. Small nonocclusive filling defect within a left lower lobe  segmental/subsegmental pulmonary artery branch vessel. RV LV ratio is normal. No other filling defects are visualized. Non aneurysmal aorta. No dissection. Nonenlarged heart. No pericardial effusion. Right-sided central venous port tip within the SVC. Minimal atherosclerosis. Mediastinum/Nodes: No significantly enlarged mediastinal lymph nodes. Midline trachea. No thyroid mass. Esophagus within normal limits. Lungs/Pleura: Multiple bilateral pulmonary nodules are again visualized. Index right middle lobe pulmonary nodule, series 7, image number 44, measures 7 mm compared to 5 mm previously. Index left upper lobe pulmonary nodule, series 7, image number 37 measures 8 mm, compared with 6 mm previously. No pleural effusion. No acute consolidation. Upper Abdomen: Peritoneal carcinomatosis with scalloped appearance of the liver and spleen. Calcifications within the low-density surrounding the liver. Heterogenous splenic enhancement likely due to arterial phase imaging. Dilated bowel in the upper abdomen. Postsurgical changes of the stomach. Dilated gallbladder measuring up to 4.6 cm. Musculoskeletal: No acute or suspicious bone lesion Review of the MIP images confirms the above findings. IMPRESSION: 1. Positive for small nonocclusive embolus in a left lower lobe segmental/subsegmental branch vessels. No elevation of the RV LV ratio. No other filling defects are seen. 2. Multiple bilateral pulmonary nodules. Slight increased size of several pulmonary nodules as described above. 3. Peritoneal carcinomatosis partially imaged. 4. Dilated bowel in the upper abdomen, could relate to a bowel obstruction 5. Dilated gallbladder. Critical Value/emergent results were called by telephone at the time of interpretation on 02/01/2017 at 12:37 am to [place] who assumed care for the patient for [facility] , who verbally acknowledged these results. Aortic Atherosclerosis (ICD10-I70.0). Electronically Signed   By: Donavan Foil M.D.   On:  02/01/2017 00:38   Ct Abdomen Pelvis W Contrast  Result Date: 02/06/2017 CLINICAL DATA:  Generalized abdominal pain. GI bleed. Stage IV metastatic cecal colon cancer. Ileus CK ectomy January 2017. Transverse loop colostomy 10/13/2016. Inpatient. Admitted with acute pulmonary embolus, placed on Xarelto anticoagulation. EXAM: CT ABDOMEN AND PELVIS WITH CONTRAST TECHNIQUE: Multidetector CT imaging of the abdomen and pelvis was performed using the standard protocol following bolus administration of intravenous contrast. CONTRAST:  100 cc ISOVUE-300 IOPAMIDOL (ISOVUE-300) INJECTION 61% COMPARISON:  12/22/2016 CT abdomen.  10/09/2016 CT abdomen/pelvis. FINDINGS: Lower chest: Re- demonstration of tiny pulmonary embolus in the left lower lobe (series 3/ image 2), not appreciably changed. Tip of superior approach central venous catheter is seen at the right cavoatrial junction. Hepatobiliary: There are several (at least 5) hypodense parenchymal liver masses scattered throughout the liver, all increased in size, largest 1.2 cm adjacent to the IVC (series 3/ image 21), previously 0.7 cm. Scalloping of the liver capsule by numerous perihepatic space calcified peritoneal implants is not appreciably changed, largest 2.8 cm thickness in the anterior liver (series 3/image 23), previously 2.7 cm. Prominently distended gallbladder (5.0 cm diameter). No radiopaque cholelithiasis. No definite gallbladder wall thickening. No biliary ductal dilatation. Common bile duct diameter 4 mm. Pancreas: Normal,  with no mass or duct dilation. Spleen: Normal size spleen. Stable scalloping of the splenic capsule by peritoneal implants, largest 1.7 cm superiorly (series 3/ image 17). Adrenals/Urinary Tract: No discrete adrenal nodules. No hydronephrosis. No renal masses. Relatively collapsed bladder contains a small amount of excreted contrast and is otherwise normal. Stomach/Bowel: Stable postsurgical changes from gastric bypass surgery with  intact appearing gastrojejunostomy and relatively collapsed excluded distal stomach. Patient is status post ileocecal resection with neo ileocolic anastomosis in the central abdomen. Patient is status post transverse loop colostomy in the ventral left abdominal wall. 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 There is prominent dilation of small bowel loops throughout the abdomen measuring up to 6.3 cm diameter, although the site of small bowel caliber transition appears to be in the right pelvis at the site of multiple infiltrative enlarging peritoneal tumor implants and not at the site of the parastomal hernia. Oral contrast traverses to the mid to distal small bowel. There is retained barium in the collapsed distal large-bowel. Ileocolic anastomosis appears intact. Vascular/Lymphatic: Normal caliber abdominal aorta. Patent hepatic, portal, splenic and renal veins . No pathologically enlarged lymph nodes in the abdomen or pelvis. Reproductive: Status post hysterectomy, with no abnormal findings at the vaginal cuff. Other: No pneumoperitoneum. Interval growth of bulky peritoneal tumor implants in the ventral and right lower peritoneal cavity. For example a 7.7 x 5.5 cm right lower quadrant implant (series 3/ image 55), previously 7.1 x 4.6 cm on 12/22/2016 using similar measurement technique, increased. A 7.0 x 5.1 cm anterior peritoneal implant (series 3/ image 40), previously 5.1 x 4.0 cm, increased. Moderate anasarca. Musculoskeletal: No aggressive appearing focal osseous lesions. IMPRESSION: 1. CT findings are suggestive of a distal small bowel obstruction due to enlarging infiltrative peritoneal tumor implants in the right lower quadrant. Although there is a new parastomal hernia containing a small bowel loop adjacent to the loop colostomy in the ventral left abdominal wall, the site of small-bowel obstruction does not appear to correlate with the  parastomal hernia site. 2. Progressive peritoneal carcinomatosis particularly in the ventral and right lower peritoneal cavity as detailed . The perihepatic and perisplenic tumor implants are relatively stable. 3. Progression of liver parenchymal metastases. 4. Nonspecific prominent gallbladder distention without biliary ductal dilatation. No radiopaque cholelithiasis . 5. Moderate anasarca. Electronically Signed   By: Ilona Sorrel M.D.   On: 02/06/2017 21:41    Time Spent in minutes  30   Berle Mull M.D on 02/08/2017 at 4:58 PM  After 7pm go to [email or web address] - password [email or web address]  [facility] -  Office  [phone]

## 2017-02-09 ENCOUNTER — Telehealth: Payer: Self-pay | Admitting: Hematology

## 2017-02-09 LAB — BASIC METABOLIC PANEL
ANION GAP: 3 — AB (ref 5–15)
ANION GAP: 3 — AB (ref 5–15)
BUN: 8 mg/dL (ref 6–20)
BUN: 8 mg/dL (ref 6–20)
CALCIUM: 6.9 mg/dL — AB (ref 8.9–10.3)
CHLORIDE: 102 mmol/L (ref 101–111)
CO2: 22 mmol/L (ref 22–32)
CO2: 22 mmol/L (ref 22–32)
Calcium: 6.9 mg/dL — ABNORMAL LOW (ref 8.9–10.3)
Chloride: 104 mmol/L (ref 101–111)
Creatinine, Ser: 0.4 mg/dL — ABNORMAL LOW (ref 0.44–1.00)
Creatinine, Ser: 0.41 mg/dL — ABNORMAL LOW (ref 0.44–1.00)
GFR calc Af Amer: >60 mL/min (ref >60)
GFR calc Af Amer: >60 mL/min (ref >60)
GFR calc non Af Amer: >60 mL/min (ref >60)
GLUCOSE: 89 mg/dL (ref 65–99)
Glucose, Bld: 107 mg/dL — ABNORMAL HIGH (ref 65–99)
POTASSIUM: 3.9 mmol/L (ref 3.5–5.1)
POTASSIUM: 3.6 mmol/L (ref 3.5–5.1)
SODIUM: 127 mmol/L — AB (ref 135–145)
Sodium: 129 mmol/L — ABNORMAL LOW (ref 135–145)

## 2017-02-09 LAB — CBC
HCT: 27.9 % — ABNORMAL LOW (ref 36.0–46.0)
Hemoglobin: 9.3 g/dL — ABNORMAL LOW (ref 12.0–15.0)
MCH: 26.8 pg (ref 26.0–34.0)
MCHC: 33.3 g/dL (ref 30.0–36.0)
MCV: 80.4 fL (ref 78.0–100.0)
Platelets: 218 K/uL (ref 150–400)
RBC: 3.47 MIL/uL — ABNORMAL LOW (ref 3.87–5.11)
RDW: 19.2 % — ABNORMAL HIGH (ref 11.5–15.5)
WBC: 7.6 K/uL (ref 4.0–10.5)

## 2017-02-09 LAB — MAGNESIUM: Magnesium: 1.5 mg/dL — ABNORMAL LOW (ref 1.7–2.4)

## 2017-02-09 MED ORDER — RIVAROXABAN 10 MG PO TABS
10.0000 mg | ORAL_TABLET | Freq: Every day | ORAL | Status: DC
  Administered 2017-02-09: 10 mg via ORAL
  Filled 2017-02-09: qty 1

## 2017-02-09 MED ORDER — SODIUM CHLORIDE 0.9 % IV BOLUS (SEPSIS)
500.0000 mL | Freq: Once | INTRAVENOUS | Status: AC
  Administered 2017-02-09: 500 mL via INTRAVENOUS

## 2017-02-09 MED ORDER — PRO-STAT SUGAR FREE PO LIQD
30.0000 mL | Freq: Two times a day (BID) | ORAL | Status: DC
  Administered 2017-02-09: 30 mL via ORAL
  Filled 2017-02-09 (×3): qty 30

## 2017-02-09 MED ORDER — MORPHINE SULFATE ER 30 MG PO TBCR
30.0000 mg | EXTENDED_RELEASE_TABLET | Freq: Once | ORAL | Status: AC
Start: 2017-02-09 — End: 2017-02-09
  Administered 2017-02-09: 30 mg via ORAL
  Filled 2017-02-09: qty 1

## 2017-02-09 MED ORDER — FENTANYL 12 MCG/HR TD PT72
25.0000 ug | MEDICATED_PATCH | TRANSDERMAL | Status: DC
Start: 2017-02-09 — End: 2017-02-11
  Administered 2017-02-09: 25 ug via TRANSDERMAL
  Filled 2017-02-09: qty 2

## 2017-02-09 MED ORDER — MORPHINE SULFATE (PF) 2 MG/ML IV SOLN
2.0000 mg | INTRAVENOUS | Status: DC
  Administered 2017-02-09 – 2017-02-10 (×6): 2 mg via INTRAVENOUS
  Filled 2017-02-09 (×6): qty 1

## 2017-02-09 MED ORDER — ENSURE ENLIVE PO LIQD: 237.0000 mL | Freq: Two times a day (BID) | ORAL | Status: DC

## 2017-02-09 MED ORDER — SODIUM CHLORIDE 0.9 % IV SOLN
INTRAVENOUS | Status: DC
  Administered 2017-02-09 – 2017-02-10 (×2): via INTRAVENOUS

## 2017-02-09 MED ORDER — MAGNESIUM SULFATE 2 GM/50ML IV SOLN
2.0000 g | Freq: Once | INTRAVENOUS | Status: AC
  Administered 2017-02-09: 2 g via INTRAVENOUS
  Filled 2017-02-09: qty 50

## 2017-02-09 MED ORDER — POLYETHYLENE GLYCOL 3350 17 G PO PACK
17.0000 g | PACK | Freq: Every day | ORAL | Status: DC
  Administered 2017-02-09 – 2017-02-10 (×2): 17 g via ORAL
  Filled 2017-02-09 (×2): qty 1

## 2017-02-09 MED ORDER — SODIUM CHLORIDE 0.9 % IV BOLUS (SEPSIS): 1000.0000 mL | Freq: Once | INTRAVENOUS | Status: DC

## 2017-02-09 NOTE — Telephone Encounter (Signed)
Per 7/18 sch msg unable to move appts from 7/30 to earlier next week due to no availability on provider and infusion sch

## 2017-02-09 NOTE — Progress Notes (Addendum)
[name]   DOB:1965-11-11   WN#:[id number]   XFG#:[id number]  Oncology follow-up note  Subjective: [name] is not doing well, complains of worsening abdominal pain, not well controlled. She has worsening colostomy prolapse, abdominal distention, and leg swollen. She has little appetite, has not eating much. Has not had significant GI bleeding since stopped Xarelto. She is very weak, sitting in a recliner.   Objective:  Vitals:   02/09/17 1033 02/09/17 1705  BP: 106/76 117/88  Pulse: (!) 118 (!) 119  Resp: 17 18  Temp: 98.7 F (37.1 C) 97.9 F (36.6 C)    Body mass index is 21.09 kg/m.  Intake/Output Summary (Last 24 hours) at 02/09/17 2150 Last data filed at 02/09/17 1430  Gross per 24 hour  Intake              306 ml  Output                0 ml  Net              306 ml     Sclerae unicteric  Oropharynx clear  No peripheral adenopathy  Lungs clear -- no rales or rhonchi  Heart regular rate and rhythm  Abdomen Very distended, (+) colostomy prolapse, diffuse tenderness   (+) Bilateral lower extremity edema  Neuro nonfocal    CBG (last 3)  No results for input(s): GLUCAP in the last 72 hours.   Labs:  Lab Results  Component Value Date   WBC 7.6 02/09/2017   HGB 9.3 (L) 02/09/2017   HCT 27.9 (L) 02/09/2017   MCV 80.4 02/09/2017   PLT 218 02/09/2017   NEUTROABS 7.6 02/06/2017   CMP Latest Ref Rng & Units 02/09/2017 02/09/2017 02/07/2017  Glucose 65 - 99 mg/dL 107(H) 89 75  BUN 6 - 20 mg/dL 8 8 16   Creatinine 0.44 - 1.00 mg/dL 0.41(L) 0.40(L) 0.47  Sodium 135 - 145 mmol/L 129(L) 127(L) 128(L)  Potassium 3.5 - 5.1 mmol/L 3.6 3.9 4.1  Chloride 101 - 111 mmol/L 104 102 102  CO2 22 - 32 mmol/L 22 22 22   Calcium 8.9 - 10.3 mg/dL 6.9(L) 6.9(L) 6.7(L)  Total Protein 6.5 - 8.1 g/dL - - 3.6(L)  Total Bilirubin 0.3 - 1.2 mg/dL - - 1.4(H)  Alkaline Phos 38 - 126 U/L - - 171(H)  AST 15 - 41 U/L - - 35  ALT 14 - 54 U/L - - 29     Urine Studies No results for input(s):  UHGB, CRYS in the last 72 hours.  Invalid input(s): UACOL, UAPR, USPG, UPH, UTP, UGL, UKET, UBIL, UNIT, UROB, [place], UEPI, UWBC, [name], [place]  Basic Metabolic Panel:  Recent Labs Lab 02/06/17 0020 02/06/17 0624 02/07/17 0338 02/09/17 0452 02/09/17 1512  NA 125* 125* 128* 127* 129*  K 5.4* 4.8 4.1 3.9 3.6  CL 92* 95* 102 102 104  CO2 24 23 22 22 22   GLUCOSE 99 88 75 89 107*  BUN 24* 24* 16 8 8   CREATININE 0.72 0.61 0.47 0.40* 0.41*  CALCIUM 6.7* 6.3* 6.7* 6.9* 6.9*  MG  --   --   --  1.5*  --    GFR Estimated Creatinine Clearance: 59.1 mL/min (A) (by C-G formula based on SCr of 0.41 mg/dL (L)). Liver Function Tests:  Recent Labs Lab 02/06/17 0020 02/07/17 0338  AST 88* 35  ALT 47 29  ALKPHOS 233* 171*  BILITOT 0.9 1.4*  PROT 5.1* 3.6*  ALBUMIN 1.9* 1.4*  No results for input(s): LIPASE, AMYLASE in the last 168 hours. No results for input(s): AMMONIA in the last 168 hours. Coagulation profile No results for input(s): INR, PROTIME in the last 168 hours.  CBC:  Recent Labs Lab 02/06/17 0020 02/06/17 0624 02/06/17 1818 02/07/17 0338 02/08/17 1007 02/09/17 0452  WBC 10.7* 9.7  --  10.0 9.2 7.6  NEUTROABS 7.6  --   --   --   --   --   HGB 9.2* 7.4* 10.2* 9.4* 10.2* 9.3*  HCT 29.8* 23.5* 30.9* 28.2* 30.8* 27.9*  MCV 79.7 79.4  --  79.4 80.4 80.4  PLT 346 305  --  229 220 218   Cardiac Enzymes: No results for input(s): CKTOTAL, CKMB, CKMBINDEX, TROPONINI in the last 168 hours. BNP: Invalid input(s): POCBNP CBG: No results for input(s): GLUCAP in the last 168 hours. D-Dimer No results for input(s): DDIMER in the last 72 hours. Hgb A1c No results for input(s): HGBA1C in the last 72 hours. Lipid Profile No results for input(s): CHOL, HDL, LDLCALC, TRIG, CHOLHDL, LDLDIRECT in the last 72 hours. Thyroid function studies No results for input(s): TSH, T4TOTAL, T3FREE, THYROIDAB in the last 72 hours.  Invalid input(s): FREET3 Anemia work  up No results for input(s): VITAMINB12, FOLATE, FERRITIN, TIBC, IRON, RETICCTPCT in the last 72 hours. Microbiology No results found for this or any previous visit (from the past 240 hour(s)).    Studies:  No results found.  Assessment: 51 y.o. with past medical history of metastatic colon cancer to peritoneum and liver, was recently diagnosed and hospitalized for segmental PE, discharged home on Xarelto 15 mg twice daily, was admitted last night due to GI bleeding, required blood transfusion.   1. GI bleeding, secondary to Xarelto and peritoneal metastasis 2. Recent diagnosed segmental PE 3. Stage IV metastatic colon cancer, with disease progression on recent CT scan  4. Severe protein and calorie malnutrition 5. 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 Worsening abdominal pain, secondary to ethanol pneumatosis and bowel obstruction   Plan:  -I discussed her CT scan findings, which showed further cancer progression. -I have stopped her cobimetinib. She has very limited treatment options, due to her worsening pain, extremely poor performance status, I think that the side effects of treatment will overweight the small benefits, and I do not recommend further anticancer treatment.  -I recommend hospice. Pt and her fianc have agreed. She has previously discussed with her fianc about her living well, power of attorney, etc., she understands she is terminal, and her priority is getting her pain controlled, and be comfortable. -she has agreed with DNR/DNI, I updated her CODE STATUS. -please consult palliative care, and transition to hospice, she prefers home hospice, overall residential hospice for now, but is open to residential hospice if she needs to go. -I have changed her morphine extended release to fentanyl patch 53mcg/hr, starting tonight, she will receive 1 more dose morphine extended release tonight, and pleased stop the  order in the morning. -I have increased her IV morphine to every 2 hours as needed for breakthrough pain, would consider oral Dilaudid if she goes home. -Given the limited benefit and risk of GI bleeding, I do not recommend prophylactic anticoagulation with low-dose Xarelto, given her current condition. -I will follow up as needed, please call me if needed -I spoke with her nurse tonight, and will communicate with Dr. Posey Pronto.  -I spoke with her fianc before and after meeting patient, he is  very supportive and agree with the plan.  I spent about 60 minutes for the visit, more than 50% of face-to-face counseling.  Truitt Merle, MD 02/09/2017  9:50 PM

## 2017-02-09 NOTE — Evaluation (Signed)
Physical Therapy Evaluation Patient Details Name: [name] MRN: [id number] DOB: 13-Sep-1965 Today's Date: 02/09/2017   History of Present Illness  Pt is a 51 yo female admitted through ED on 02/06/17 with bleeding into colostomy. Pt was diagnosed with a small bowel obstruction caused by infiltrates from her colon cancer and was noted to have mets to liver and mild anascara. Pt was recently hospitalized from 7-10/7-12 with mild pulmonary PE. PMH significiant for colon CA, anemia, GERD, anxiety, depression, fibromyalgia.   Clinical Impression  Pt presents with the above diagnosis and below deficits for therapy evaluation. Prior to admission, pt lived with her fiancee in a single level apartment with 8 steps leading into the front door. Pt was completely independent and moving well until about 1 month ago. Pt now presents with increased fatigue and poor activity tolerance. Pt is able to perform very short distance gait to bathroom and back to recliner before becoming fatigued. Pt will benefit from continued skilled PT in order to address the below deficits prior to discharge to venue recommended below.     Follow Up Recommendations Home health PT;Supervision for mobility/OOB    Equipment Recommendations  None recommended by PT    Recommendations for Other Services       Precautions / Restrictions Precautions Precautions: Fall Restrictions Weight Bearing Restrictions: No      Mobility  Bed Mobility               General bed mobility comments: sitting EOB with PT arrives  Transfers Overall transfer level: Needs assistance Equipment used: None Transfers: Sit to/from Stand Sit to Stand: Min guard         General transfer comment: Min guard for safety from EOB  Ambulation/Gait Ambulation/Gait assistance: Min guard Ambulation Distance (Feet): 20 Feet Assistive device: None Gait Pattern/deviations: Step-through pattern;Decreased step length - right;Decreased step length -  left;Narrow base of support Gait velocity: decreased Gait velocity interpretation: Below normal speed for age/gender General Gait Details: slow, steady gait without an AD. No LOB, just increased fatigue  Stairs            Wheelchair Mobility    Modified Rankin (Stroke Patients Only)       Balance Overall balance assessment: Needs assistance Sitting-balance support: Feet supported;No upper extremity supported Sitting balance-Leahy Scale: Good     Standing balance support: No upper extremity supported;During functional activity Standing balance-Leahy Scale: Fair                               Pertinent Vitals/Pain Pain Assessment: 0-10 Pain Score: 8  Pain Location: low back Pain Descriptors / Indicators: Aching;Constant Pain Intervention(s): Monitored during session;Premedicated before session;Repositioned    Home Living Family/patient expects to be discharged to:: Private residence Living Arrangements: Spouse/significant other Available Help at Discharge: Family;Available PRN/intermittently Type of Home: Apartment Home Access: Stairs to enter Entrance Stairs-Rails: Right;Left;Can reach both Entrance Stairs-Number of Steps: 8 Home Layout: One level Home Equipment: None      Prior Function Level of Independence: Independent         Comments: completely independent prior to Admission     Hand Dominance        Extremity/Trunk Assessment   Upper Extremity Assessment Upper Extremity Assessment: Generalized weakness    Lower Extremity Assessment Lower Extremity Assessment: Generalized weakness    Cervical / Trunk Assessment Cervical / Trunk Assessment: Kyphotic  Communication   Communication: No difficulties  Cognition  Arousal/Alertness: Awake/alert Behavior During Therapy: WFL for tasks assessed/performed Overall Cognitive Status: Within Functional Limits for tasks assessed                                         General Comments      Exercises     Assessment/Plan    PT Assessment Patient needs continued PT services  PT Problem List Decreased strength;Decreased activity tolerance;Decreased balance;Decreased mobility       PT Treatment Interventions DME instruction;Gait training;Stair training;Functional mobility training;Therapeutic activities;Therapeutic exercise;Balance training    PT Goals (Current goals can be found in the Care Plan section)  Acute Rehab PT Goals Patient Stated Goal: to get home and feel better PT Goal Formulation: With patient Time For Goal Achievement: 02/16/17 Potential to Achieve Goals: Fair    Frequency Min 3X/week   Barriers to discharge        Co-evaluation               AM-PAC PT "6 Clicks" Daily Activity  Outcome Measure Difficulty turning over in bed (including adjusting bedclothes, sheets and blankets)?: None Difficulty moving from lying on back to sitting on the side of the bed? : None Difficulty sitting down on and standing up from a chair with arms (e.g., wheelchair, bedside commode, etc,.)?: A Little Help needed moving to and from a bed to chair (including a wheelchair)?: A Little Help needed walking in hospital room?: A Little Help needed climbing 3-5 steps with a railing? : A Little 6 Click Score: 20    End of Session Equipment Utilized During Treatment: Gait belt Activity Tolerance: Patient limited by fatigue Patient left: in chair;with call bell/phone within reach Nurse Communication: Mobility status PT Visit Diagnosis: Unsteadiness on feet (R26.81);Muscle weakness (generalized) (M62.81);Adult, failure to thrive (R62.7)    Time: 5176-1607 PT Time Calculation (min) (ACUTE ONLY): 14 min   Charges:   PT Evaluation $PT Eval Moderate Complexity: 1 Procedure     PT G Codes:        Scheryl Marten PT, DPT  [phone]   Shanon Rosser 02/09/2017, 4:05 PM

## 2017-02-09 NOTE — Progress Notes (Addendum)
[facility] Progress Note  Patient: [name] NFA:[id number]   PCP: Carol Ada, MD DOB: 25-Sep-1965   DOA: 02/06/2017   DOS: 02/09/2017   Date of Service: the patient was seen and examined on 02/09/2017  Subjective: Although patient denies any acute complaint on my evaluation later on she is significantly dizzy when working with physical therapy as well as during orthostatic vital. She also has not had significant BM and is not eating at all during majority of her meals.  Brief hospital course: Pt. 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 Currently further plan is conservative management.  Assessment and Plan: Small bowel obstruction Gen. surgery was consulted. Patient is actually advancing well.  Gen. surgery recommended to advance the diet and patient is currently tolerating soft diet.  Patient is at high risk for reoccurrence of SBO due to advance carcinomatosis as well as presence of hernia around the colostomy.  LGIB -blood in colostomy likely secondary to peritoneal carcinomatosis GI consulted.  No further inpatient workup recommended at present. Per recommendations from oncology recommended to start the patient on Xarelto 10mg  dose. We'll monitor overnight a initiation of the anticoagulation.  Dizziness and lightheadedness. From profound dehydration. Hyponatremia is also likely secondary from the same. Improving with IV hydration. We'll monitor.  Anemia  S/p 2 units prbc 7/17 for Hgb 9.2=>7.4, w mild hypotension Daily CBC. Hemoglobin stabilized.  Hyperkalemia resolved Monitor daily BMP  Recent PE 7/11 BLE [place] negative for DVT. Per recommendation from hematology no indication for IVC filter at present.  Hyponatremia improving Cont NS IVF Repeat cmp in  am  Goals of care discussion I have been trying to talk to the patient for last 2 days regarding her course of care as well as regarding involving palliative care in her treatment team given there is evidence of advancing disease on CT scan and patient declined with poor by mouth intake and severe protein calorie malnutrition. Patient is also at high risk for reoccurrence of her SBO and has been in the hospital this time from a GI bleed episode that started after she was started on anticoagulation for her recent PE diagnosis. With multiple comorbidities feel that the patient should be seen by palliative care either inpatient or as an outpatient although the patient currently wants to wait. Patient wants to discuss with her significant other regarding the most form and a living will. Information was provided to pt about this to the patient.  Diet: soft diet DVT Prophylaxis: on therapeutic anticoagulation.  Advance goals of care discussion: full code  Family Communication: no family was present at bedside, at the time of interview.  Disposition:  Discharge to home.  Consultants: oncology, general surgery, gastroenterology Procedures: PRBC  Antibiotics: Anti-infectives    None       Objective: Physical Exam: Vitals:   02/08/17 1747 02/08/17 2048 02/09/17 0412 02/09/17 1033  BP: 118/85 107/74 111/78 106/76  Pulse: (!) 119 (!) 119 (!) 126 (!) 118  Resp: 18 18 16 17   Temp: 98.6 F (37 C) 98.9 F (37.2 C) 99.4 F (37.4 C) 98.7 F (37.1 C)  TempSrc: Oral Oral Oral Oral  SpO2: 99% 100% 100% 98%  Weight:  49 kg (108 lb)    Height:  5' (1.524 m)      Intake/Output Summary (Last 24 hours) at 02/09/17  [place] filed at 02/09/17 1430  Gross per 24 hour  Intake              486 ml  Output                0 ml  Net              486 ml   Filed Weights   02/06/17 2145 02/07/17 2058 02/08/17 2048  Weight: 49.1 kg (108 lb 3.9 oz) 49.2 kg (108 lb 8 oz) 49 kg (108 lb)   General:  Alert, Awake and Oriented to Time, Place and Person. Appear in mild distress, affect flat Eyes: PERRL, Conjunctiva normal ENT: Oral Mucosa clear moist. Neck: no JVD, no Abnormal Mass Or lumps Cardiovascular: S1 and S2 Present, no Murmur, Peripheral Pulses Present Respiratory: normal respiratory effort, Bilateral Air entry equal and Decreased, no use of accessory muscle, Clear to Auscultation, no Crackles, no wheezes Abdomen: Bowel Sound present, Soft and no tenderness, no hernia Skin: no redness, no Rash, no induration Extremities: no Pedal edema, no calf tenderness Neurologic: Grossly no focal neuro deficit. Bilaterally Equal motor strength  Data Reviewed: CBC:  Recent Labs Lab 02/06/17 0020 02/06/17 0624 02/06/17 1818 02/07/17 0338 02/08/17 1007 02/09/17 0452  WBC 10.7* 9.7  --  10.0 9.2 7.6  NEUTROABS 7.6  --   --   --   --   --   HGB 9.2* 7.4* 10.2* 9.4* 10.2* 9.3*  HCT 29.8* 23.5* 30.9* 28.2* 30.8* 27.9*  MCV 79.7 79.4  --  79.4 80.4 80.4  PLT 346 305  --  229 220 622   Basic Metabolic Panel:  Recent Labs Lab 02/06/17 0020 02/06/17 0624 02/07/17 0338 02/09/17 0452 02/09/17 1512  NA 125* 125* 128* 127* 129*  K 5.4* 4.8 4.1 3.9 3.6  CL 92* 95* 102 102 104  CO2 24 23 22 22 22   GLUCOSE 99 88 75 89 107*  BUN 24* 24* 16 8 8   CREATININE 0.72 0.61 0.47 0.40* 0.41*  CALCIUM 6.7* 6.3* 6.7* 6.9* 6.9*  MG  --   --   --  1.5*  --     Liver Function Tests:  Recent Labs Lab 02/06/17 0020 02/07/17 0338  AST 88* 35  ALT 47 29  ALKPHOS 233* 171*  BILITOT 0.9 1.4*  PROT 5.1* 3.6*  ALBUMIN 1.9* 1.4*   No results for input(s): LIPASE, AMYLASE in the last 168 hours. No results for input(s): AMMONIA in the last 168 hours. Coagulation Profile: No results for input(s): INR, PROTIME in the last 168 hours. Cardiac Enzymes: No results for input(s): CKTOTAL, CKMB, CKMBINDEX, TROPONINI in the last 168 hours. BNP (last 3 results) No results for input(s): PROBNP in the last  8760 hours. CBG: No results for input(s): GLUCAP in the last 168 hours. Studies: No results found.  Scheduled Meds: . feeding supplement (ENSURE ENLIVE)  237 mL Oral BID BM  . feeding supplement (PRO-STAT SUGAR FREE 64)  30 mL Oral BID  . morphine  30 mg Oral QHS  . pantoprazole (PROTONIX) IV  40 mg Intravenous Q12H  . polyethylene glycol  17 g Oral Daily  . rivaroxaban  10 mg Oral Q supper  . sodium chloride flush  3 mL Intravenous Q12H   Continuous Infusions: . sodium chloride 125 mL/hr at 02/09/17 1537   PRN Meds: acetaminophen **OR** acetaminophen, ALPRAZolam, cyclobenzaprine, morphine injection, ondansetron **OR** ondansetron (ZOFRAN) IV, oxyCODONE, prochlorperazine, simethicone, traMADol  Time spent: 40 minutes  Author: Berle Mull, MD [facility] Pager: [phone] 02/09/2017 5:52 PM  If 7PM-7AM, please contact night-coverage at [email or web address], password [facility]

## 2017-02-10 MED ORDER — MORPHINE SULFATE (PF) 2 MG/ML IV SOLN
2.0000 mg | INTRAVENOUS | Status: DC
  Administered 2017-02-10 – 2017-02-11 (×8): 2 mg via INTRAVENOUS
  Filled 2017-02-10 (×9): qty 1

## 2017-02-10 MED ORDER — PROMETHAZINE HCL 25 MG/ML IJ SOLN
12.5000 mg | Freq: Four times a day (QID) | INTRAMUSCULAR | Status: DC | PRN
  Administered 2017-02-10: 12.5 mg via INTRAVENOUS
  Filled 2017-02-10: qty 1

## 2017-02-10 NOTE — Progress Notes (Addendum)
[facility] Progress Note  Patient: [name] IWP:[id number]   PCP: Carol Ada, MD DOB: [birth year]   DOA: 02/06/2017   DOS: 02/10/2017   Date of Service: the patient was seen and examined on 02/10/2017  Subjective: Patient upset, teary eyes. Complaints about abdominal pain not well controlled. Also actively vomiting on my evaluation. Frustrated with her prognosis.  Brief hospital course: Pt. 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 Currently further plan is arrange hospice and provide comfort measures.   Assessment and Plan: Small bowel obstruction Gen. surgery was consulted. Patient is actually advancing well.  Gen. surgery recommended to advance the diet and patient is currently tolerating soft diet.  Patient is at high risk for reoccurrence of SBO due to advance carcinomatosis as well as presence of hernia around the colostomy.  LGIB -blood in colostomy likely secondary to peritoneal carcinomatosis GI consulted.  No further inpatient workup recommended at present. Per recommendations from oncology and she'll started on Xarelto but now that she Has Been Changed from Active Treatment to Complete Comfort with History of GI Bleed Risk of Anticoagulation outweighs the benefit.  Dizziness and lightheadedness. From profound dehydration. Hyponatremia is also likely secondary from the same. Improving with IV hydration.  Anemia  S/p 2 units prbc 7/17 for Hgb 9.2=>7.4, w mild hypotension Daily CBC. Hemoglobin stabilized.  Hyperkalemia resolved Monitor daily BMP  Recent PE 7/11 BLE [place] negative for DVT. Per recommendation from hematology no indication for IVC filter at present.  Hyponatremia improving  Goals of care discussion I have been trying to talk to the patient  regarding her course of care as well as regarding involving palliative care in her treatment team given there is evidence of advancing disease on CT scan and patient declined with poor by mouth intake and severe protein calorie malnutrition. Patient finally opened up to her oncologist yesterday and complained about uncontrolled pain. Highly appreciate input from Dr. Burr Medico, and in her assistance with discussing goals of care for the patient. Goal has been transitioned to comfort and palliation with probable plan for home hospice. [facility] has been involved in patient's care. At present with persistent nausea, minimal output from ostomy as well as uncontrolled pain I'm concerned regarding patient's discharge home and with minimal oral intake and concern regarding patient's prognosis which is probably less than 4 weeks. Palliative care consulted to assist in ensuring optimal care for the patient.  Diet: soft diet DVT Prophylaxis: on therapeutic anticoagulation.  Advance goals of care discussion: full code  Family Communication: no family was present at bedside, at the time of interview.  Disposition:  Discharge to be determined.   Consultants: oncology, general surgery, gastroenterology, palliative care Procedures: PRBC  Antibiotics: Anti-infectives    None       Objective: Physical Exam: Vitals:   02/09/17 1705 02/09/17 2216 02/10/17 0431 02/10/17 0830  BP: 117/88 120/85 (!) 127/94 (!) 127/95  Pulse: (!) 119 76 (!) 117 (!) 118  Resp: 18 15 19    Temp: 97.9 F (36.6 C) 98.4 F (36.9 C) 98.3 F (36.8 C) 98.3 F (36.8 C)  TempSrc: Oral Oral Oral Oral  SpO2: 98% (!) 84% 100% 98%  Weight:  48.5 kg (106 lb 14.8 oz)    Height:        Intake/Output Summary (Last 24 hours) at  02/10/17 1633 Last data filed at 02/10/17 1035  Gross per 24 hour  Intake          1678.92 ml  Output              110 ml  Net          1568.92 ml   Filed Weights   02/07/17 2058 02/08/17 2048  02/09/17 2216  Weight: 49.2 kg (108 lb 8 oz) 49 kg (108 lb) 48.5 kg (106 lb 14.8 oz)   General: Alert, Awake and Oriented to Time, Place and Person. Appear in Marked distress, affect flat Eyes: PERRL, Conjunctiva normal ENT: Oral Mucosa clear moist. Neck: no JVD, no Abnormal Mass Or lumps Cardiovascular: S1 and S2 Present, no Murmur, Peripheral Pulses Present Respiratory: normal respiratory effort, Bilateral Air entry equal and Decreased, no use of accessory muscle, Clear to Auscultation, no Crackles, no wheezes Abdomen: Bowel Sound present, Soft and no tenderness, no hernia Skin: no redness, no Rash, no induration Extremities: no Pedal edema, no calf tenderness Neurologic: Grossly no focal neuro deficit. Bilaterally Equal motor strength  Data Reviewed: CBC:  Recent Labs Lab 02/06/17 0020 02/06/17 0624 02/06/17 1818 02/07/17 0338 02/08/17 1007 02/09/17 0452  WBC 10.7* 9.7  --  10.0 9.2 7.6  NEUTROABS 7.6  --   --   --   --   --   HGB 9.2* 7.4* 10.2* 9.4* 10.2* 9.3*  HCT 29.8* 23.5* 30.9* 28.2* 30.8* 27.9*  MCV 79.7 79.4  --  79.4 80.4 80.4  PLT 346 305  --  229 220 637   Basic Metabolic Panel:  Recent Labs Lab 02/06/17 0020 02/06/17 0624 02/07/17 0338 02/09/17 0452 02/09/17 1512  NA 125* 125* 128* 127* 129*  K 5.4* 4.8 4.1 3.9 3.6  CL 92* 95* 102 102 104  CO2 24 23 22 22 22   GLUCOSE 99 88 75 89 107*  BUN 24* 24* 16 8 8   CREATININE 0.72 0.61 0.47 0.40* 0.41*  CALCIUM 6.7* 6.3* 6.7* 6.9* 6.9*  MG  --   --   --  1.5*  --     Liver Function Tests:  Recent Labs Lab 02/06/17 0020 02/07/17 0338  AST 88* 35  ALT 47 29  ALKPHOS 233* 171*  BILITOT 0.9 1.4*  PROT 5.1* 3.6*  ALBUMIN 1.9* 1.4*   No results for input(s): LIPASE, AMYLASE in the last 168 hours. No results for input(s): AMMONIA in the last 168 hours. Coagulation Profile: No results for input(s): INR, PROTIME in the last 168 hours. Cardiac Enzymes: No results for input(s): CKTOTAL, CKMB, CKMBINDEX,  TROPONINI in the last 168 hours. BNP (last 3 results) No results for input(s): PROBNP in the last 8760 hours. CBG: No results for input(s): GLUCAP in the last 168 hours. Studies: No results found.  Scheduled Meds: . feeding supplement (PRO-STAT SUGAR FREE 64)  30 mL Oral BID  . fentaNYL  25 mcg Transdermal Q72H  .  morphine injection  2-4 mg Intravenous Q2H  . pantoprazole (PROTONIX) IV  40 mg Intravenous Q12H  . polyethylene glycol  17 g Oral Daily  . sodium chloride flush  3 mL Intravenous Q12H   Continuous Infusions:  PRN Meds: acetaminophen **OR** acetaminophen, ALPRAZolam, cyclobenzaprine, ondansetron **OR** ondansetron (ZOFRAN) IV, oxyCODONE, prochlorperazine, promethazine, simethicone, traMADol  Time spent: 40 minutes  Author: Berle Mull, MD [facility] Pager: [phone] 02/10/2017 4:33 PM  If 7PM-7AM, please contact night-coverage at [email or web address], password [facility]

## 2017-02-10 NOTE — Care Management Note (Addendum)
Case Management Note  Patient Details  Name: [name] MRN: [id number] Date of Birth: 15-May-1966  Subjective/Objective:     CM following for progression and d/c planning.                Action/Plan: 02/10/2017 10:30am Notified by MD that pt wishes to have hospice home care at the time of d/c. This CM meet with pt who is alert, but very weak and asked this CM to contact her boyfriend , [name] @ [phone] for a decision re hospice agency to provide home care when offered choices. Pt states that he is her healthcare POA. This CM informed pt that he will be contacted as she request however his ability to make healthcare decisions are at her request only as health care POA he would generally not be consulted unless she is unable to understand and make her own decision. This CM attempted the number this pt provided and was unable to get an answer or leave a message. Mr Rica Mote' phone number in the pt registration information was different [phone]. This CM also tried that number and was able to leave a message asking Mr Rica Mote to call this CM re pt needs.  Both of these numbers were cell phone number , this CM also call the number listed as Mr Preston home number, and as unable to leave a message. Will continue to attempt to reach Mr Rica Mote.  2 pm received return call from Mr Rica Mote, who has been at work, he has selected [facility]) for hospice home care. [facility] notifed and return call received from Amy , hospice RN rep, who will call Mr Rica Mote and meet with pt this afternoon. Pt with nausea and vomiting today, do not plan to d/c today, attempting to control symptoms.  3:50 Amy, hospice rep has met with pt and spoke with Mr Rica Mote , arrangements made for DME to be delivered to pt home, when pt is d/c she will need ambulance transport home as she lives on the second floor of her apartment and is unable to climb steps.      Expected Discharge Date:      02/10/2017             Expected Discharge Plan:  Home w Hospice Care  In-House Referral:  NA  Discharge planning Services  CM Consult  Post Acute Care Choice:  Home Health, Durable Medical Equipment Choice offered to:  Patient  DME Arranged:    DME Agency:     HH Arranged:    [place] Agency:     Status of Service:  In process, will continue to follow  If discussed at Long Length of Stay Meetings, dates discussed:    Additional Comments:  Adron Bene, RN 02/10/2017, 10:36 AM

## 2017-02-10 NOTE — Progress Notes (Addendum)
Palliative Medicine RN Note: Consult order noted, but patient has already decided on hospice. RN CM Malachy Mood will set it up per existing order. She will call the team if there is an unmet palliative need.  Marjie Skiff Jamiah Recore, RN, BSN, [facility] 02/10/2017 10:00 AM Cell [phone] 8:00-4:00 Monday-Friday Office [phone]   1050 Rec'd call from Dr. Posey Pronto. He is requesting assistance with both symptoms and hospice discussion. Patient retained her place on the list of pending referrals.   Marjie Skiff Tanveer Brammer, RN, BSN, [facility] 02/10/2017 10:57 AM Cell [phone] 8:00-4:00 Monday-Friday Office [phone]

## 2017-02-10 NOTE — Progress Notes (Signed)
Initial Nutrition Assessment  DOCUMENTATION CODES:   Severe malnutrition in context of chronic illness  INTERVENTION:   -Cater to pt preferences; pt would like benefit from smaller, more frequent meals  -Discontinue Ensure Enlive per pt request; pt does not want any supplements at this time  -Based on current poc, no aggressive interventions warranted at this time. Will continue to follow and make further recommendations as needed  NUTRITION DIAGNOSIS:   Malnutrition (Severe) related to chronic illness, cancer and cancer related treatments (metastatic colon cancer, gastric bypass) as evidenced by percent weight loss, severe depletion of muscle mass, severe depletion of body fat.  GOAL:   Patient will meet greater than or equal to 90% of their needs  MONITOR:   PO intake, Labs, Weight trends  REASON FOR ASSESSMENT:   Consult Assessment of nutrition requirement/status  ASSESSMENT:   51 yo female admitted with blood in ostomy bag. Pt with hx of colon cancer with peritoneal carcinomatosis with colon resection and colostomy earlier this year. Pt with additional hx of gastric bypass, GERD, fibromyalgia, depression   Pt with further progression of cancer on CT scan, noted oncology recommending no further anticancer treatment with recommendation of hospice. Palliative care has been consulted.   Poor appetite, has not been eating much. Pt is very weak. Pt vomited x 2 this AM, sipping on broth.  Reports the only thing she can tolerate right now is liquids, no milk products. Pt did try an Ensure yesterday but upset her stomach and does not want to try again.   13.8% wt loss in 4 months (significant for time frame)  Nutrition-Focused physical exam completed. Findings are severe fat depletion, severe muscle depletion, and no edema.   Labs: sodium 129, corrected calcium 8.98, albumin 1.4,  Meds: NS at 125 ml/hr  Diet Order:  DIET SOFT Room service appropriate? Yes; Fluid consistency:  Thin  Skin:  Reviewed, no issues  Last BM:  7/17  Height:   Ht Readings from Last 1 Encounters:  02/08/17 5' (1.524 m)    Weight:   Wt Readings from Last 1 Encounters:  02/09/17 106 lb 14.8 oz (48.5 kg)    Ideal Body Weight:     BMI:  Body mass index is 20.88 kg/m.  Estimated Nutritional Needs:   Kcal:  7654-6503 kcals  Protein:  75-86 g  Fluid:  >/= 1.5 L  EDUCATION NEEDS:   No education needs identified at this time  [place], [place], LDN [phone] Pager  [phone] Weekend/On-Call Pager

## 2017-02-10 NOTE — Progress Notes (Signed)
[facility]) Hospital Liaison:  RN visit  Notified by Malachy Mood [facility], of patient/family request for [facility] services at home after discharge.  Chart and patient information under review by [facility] physician.  Hospice eligibility pending at this time.    Writer spoke with patient at bedside and fiance, Audry Pili, over the phone, to initiate education related to hospice philosophy, services and team approach to care.  Patient/family verbalized understanding of information given.  Per discussion, plan is for discharge to home by PTAR - after discussion with Malachy Mood, [facility], over the weekend.  No date at this time.   Please send signed and completed DNR form home with patient/family.  Patient will need prescriptions for discharge comfort medications.   DME needs have been discussed, patient currently has no equipment in the home.  Patient/family requests the following DME for delivery to the home:  Hospital bed 1/2 rails, OBT, 3 in 1, and walker.  [facility] equipment manager has been notified and will contact [place] to arrange delivery to the home.  Home address has been verified and is correct in the chart.  [name], fiance, is the family member to contact to arrange delivery.  Please reach Ricky at [phone].  [facility] aware of the above.  Completed discharge summary will need to be faxed to [facility] at [phone] when final.  Please notify [facility] when patient is ready to leave the unit at discharge.  (Call [phone] or [phone] after 5pm and on weekends).  [facility] information and contact numbers given to patient at bedside and Ricky over the phone.  Above information shared with Malachy Mood, [facility].    Please call with any hospice related questions.  Thank you for this referral.  Edyth Gunnels, RN, [place] Hospital Liaison [phone]  All hospital liaisons are now on [place].

## 2017-02-11 DIAGNOSIS — Z515 Encounter for palliative care: Secondary | ICD-10-CM

## 2017-02-11 MED ORDER — OXYCODONE HCL 5 MG PO TABS
10.0000 mg | ORAL_TABLET | ORAL | Status: AC
  Administered 2017-02-11 (×4): 10 mg via ORAL
  Filled 2017-02-11 (×4): qty 2

## 2017-02-11 MED ORDER — FENTANYL 25 MCG/HR TD PT72
37.0000 ug | MEDICATED_PATCH | TRANSDERMAL | Status: DC
  Administered 2017-02-11: 37.5 ug via TRANSDERMAL
  Filled 2017-02-11: qty 1

## 2017-02-11 MED ORDER — PROMETHAZINE HCL 25 MG/ML IJ SOLN: 12.5000 mg | Freq: Four times a day (QID) | INTRAMUSCULAR | Status: DC | PRN

## 2017-02-11 MED ORDER — SENNA 8.6 MG PO TABS
2.0000 | ORAL_TABLET | Freq: Every day | ORAL | Status: DC
  Administered 2017-02-11 – 2017-02-12 (×2): 17.2 mg via ORAL
  Filled 2017-02-11 (×2): qty 2

## 2017-02-11 NOTE — Progress Notes (Signed)
Pt's fiance, Audry Pili, gave this nurse another number to reach him by: [phone] if anything is needed.   Eleanora Neighbor, RN

## 2017-02-11 NOTE — Consult Note (Signed)
Consultation Note Date: 02/11/2017   Patient Name: [name]  DOB: Apr 14, 1966  MRN: [id number]  Age / Sex: 51 y.o., female  PCP: Carol Ada, MD Referring Physician: Lavina Hamman, MD  Reason for Consultation: Establishing goals of care, Hospice Evaluation, Inpatient hospice referral, Non pain symptom management, Pain control and Psychosocial/spiritual support  HPI/Patient Profile: 51 y.o. 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 Patient was found to have symptomatic anemia as well as small bowel obstruction. Asked to see patient regarding hospice as well as symptom management  Clinical Assessment and Goals of Care: Patient has already spoken to hospice and palliative care of [facility] and has decided in light of her worsening colon cancer with carcinomatosis, metastatic disease to her liver, to go home with hospice. She was recently started on a fentanyl transdermal patch at 25 g on 02/09/2017; additionally receiving morphine 2-4 mg every 2 hours around-the-clock (2 mg have been administered most consistently per review MAR). She has not vomited in the past 24 hours. Eating makes her nausea worse. She has been up to the bathroom, movement has not initiated vomiting.  In addition to symptom management we discussed levels of care in terms of hospice in the home but also residential hospice as a resource available in the future for both symptom management admission for end-of-life care. Also reviewed with patient that they offer 24 7 on-call services  Patient at this point is able to make her own healthcare decisions but in the event that she were to become unable to, her fianc, [name], is her healthcare power of attorney. Numbers for Mr. Rica Mote are  [phone]; [phone]    [place] with hospice of [place]. Discharge likely on 02/12/2017. [facility] has a 63 seen and evaluated patient Continue with fentanyl transdermal as this will bypass the GI tract and less likely to cause nausea in the setting of metastatic colon cancer. We'll increase her patch today to 37 g and DC IV morphine; resume  oxycodone. Patient would be unable to return home with IV morphine and alternate med regime would need to be started and evaluated overnight to ensure adequate pain management as well as that her nausea is under control Code Status/Advance Care Planning:  DNR    Symptom Management:   Pain: Increase fentanyl transdermal to 37 g every 72 hours. Even though this was initiated  a little less than 72 hours that we normally allow before dosage escalation, patient has been receiving scheduled morphine IV 2mg  every 2 hours ATC and is tolerating increased opioid dosing without undue sedation or respiratory depression; vital signs stable; ambulating with minimal assist. We'll discontinue scheduled morphine IV. Initiate oxycodone by mouth 10 mg every 3 hours for 4 doses; and continue oxycodone 10 mg every 4 hours as needed for breakthrough pain. We'll also discontinue tramadol. Educated patient not to resume morphine extended release,  or tramadol without instruction from hospice of [facility] provider  Constipation: Patient is a high risk for another small bowel obstruction. She is currently on MiraLAX but will discontinue this and start senna 2 tablets daily and titrate for effect. I am concerned that MiraLAX requires 8 ounces of fluid with it and that on Sundays patient's nausea precludes consistent dosing.   Palliative Prophylaxis:   Aspiration, Bowel Regimen, Frequent Pain Assessment and Oral Care  Additional Recommendations (Limitations, Scope, Preferences):  Full Comfort Care  Psycho-social/Spiritual:    Desire for further Chaplaincy support:no  Additional Recommendations: Grief/Bereavement Support  Prognosis:   < 6 weeks  Discharge Planning: Home with Hospice      Primary Diagnoses: Present on Admission: . Cancer of right colon ([place]) . Hyponatremia . Pulmonary embolus ([place]) . Lower GI bleed . Anemia, iron deficiency . GI bleed   I have reviewed the medical record, interviewed the patient and family, and examined the patient. The following aspects are pertinent.  Past Medical History:  Diagnosis Date  . Anemia   . Anxiety   . Cancer ([place])    colon cancer stage 3  . Constipation   . Depression   . Fibromyalgia   . GERD (gastroesophageal reflux disease)   . JP drain bleeding    Social History   Social History  . Marital status: Significant Other    Spouse name: N/A  . Number of children: N/A  . Years of education: N/A   Occupational History  . underwriter support specialist    Social History Main Topics  . Smoking status: Never Smoker  . Smokeless tobacco: Never Used  . Alcohol use 1.2 oz/week    2 Glasses of wine per week     Comment: moderate 1-2 times a month   . Drug use: No  . Sexual activity: Not Asked   Other Topics Concern  . None   Social History Narrative   Single, fiance [name]   Works [employer] 11:30am to 8 pm   Family History  Problem Relation Age of Onset  . Cancer Father 8       small cell lung cancer and colon cancer   . Cancer Sister 50       small cell lung cancer  . Cancer Maternal Aunt        ovarian cancer  . Cancer Paternal Uncle        brain cancer   . Cancer Other 40       ovarian cancer (maternal niece)   Scheduled Meds: . feeding supplement (PRO-STAT SUGAR FREE 64)  30 mL Oral BID  . fentaNYL  37.5 mcg Transdermal Q72H  . oxyCODONE  10 mg Oral Q3H  . pantoprazole (PROTONIX) IV  40 mg Intravenous Q12H  . senna  2 tablet Oral Daily  . sodium chloride flush  3 mL Intravenous Q12H    Continuous Infusions: PRN Meds:.acetaminophen **OR** acetaminophen, ALPRAZolam, cyclobenzaprine, ondansetron **OR** ondansetron (ZOFRAN) IV, oxyCODONE, promethazine, simethicone Medications Prior to Admission:  Prior to Admission medications   Medication Sig Start Date End Date Taking? Authorizing Provider  ALPRAZolam (XANAX) 0.25 MG tablet Take 1-2 tablets (0.25-0.5 mg total) by mouth at bedtime as needed for anxiety or sleep. 01/23/17  Yes Truitt Merle, MD  cobimetinib fumarate (COTELLIC) 20 MG tablet Take 3 tablets (60 mg total) by mouth daily. Take on days 1-21. Repeat every 28 days. Avoid sun exposure. 01/23/17  Yes Truitt Merle, MD  cyclobenzaprine (FLEXERIL) 10 MG tablet  Take 1 tablet (10 mg total) by mouth 3 (three) times daily as needed for muscle spasms. 03/18/16  Yes Truitt Merle, MD  morphine (MS CONTIN) 30 MG 12 hr tablet Take 1 tablet (30 mg total) by mouth every 12 (twelve) hours. Patient taking differently: Take 30 mg by mouth at bedtime.  01/23/17  Yes Truitt Merle, MD  Oxycodone HCl 10 MG TABS Take 1 tablet (10 mg total) by mouth every 4 (four) hours. 01/23/17  Yes Truitt Merle, MD  prochlorperazine (COMPAZINE) 10 MG tablet Take 1 tablet (10 mg total) by mouth every 6 (six) hours as needed for nausea or vomiting. 01/23/17  Yes Truitt Merle, MD  traMADol (ULTRAM) 50 MG tablet Take 1-2 tablets (50-100 mg total) by mouth every 6 (six) hours as needed. Patient taking differently: Take 50-100 mg by mouth every 6 (six) hours as needed for severe pain.  04/20/16  Yes Truitt Merle, MD  clindamycin (CLINDAGEL) 1 % gel Apply topically 2 (two) times daily. 01/23/17   Truitt Merle, MD  lidocaine-prilocaine (EMLA) cream Apply 1 application topically as needed. Patient not taking: Reported on 02/06/2017 05/23/16   Truitt Merle, MD  triamcinolone ointment (KENALOG) 0.5 % Apply 1 application topically 2 (two) times daily. 01/23/17   Truitt Merle, MD   Allergies  Allergen Reactions  . Latex Hives  . Cymbalta [Duloxetine Hcl] Rash  .  Penicillins Rash    Has patient had a PCN reaction causing immediate rash, facial/tongue/throat swelling, SOB or lightheadedness with hypotension: No Has patient had a PCN reaction causing severe rash involving mucus membranes or skin necrosis: No Has patient had a PCN reaction that required hospitalization No Has patient had a PCN reaction occurring within the last 10 years: No If all of the above answers are "NO", then may proceed with Cephalosporin use.    Review of Systems  Constitutional: Positive for activity change and fatigue.  HENT: Negative.   Eyes: Negative.   Respiratory: Positive for shortness of breath.   Cardiovascular: Positive for leg swelling.  Gastrointestinal: Positive for abdominal distention, nausea and vomiting.  Endocrine: Negative.   Genitourinary: Negative.   Musculoskeletal: Positive for back pain.  Skin: Negative.   Allergic/Immunologic: Negative.   Neurological: Positive for weakness.  Hematological: Negative.   Psychiatric/Behavioral: The patient is nervous/anxious.     Physical Exam  Vital Signs: BP 122/84 (BP Location: Right Arm)   Pulse 100   Temp 98.4 F (36.9 C) (Oral)   Resp 16   Ht 5' (1.524 m)   Wt 48.5 kg (107 lb)   SpO2 98%   BMI 20.90 kg/m  Pain Assessment: 0-10   Pain Score: 0-No pain   SpO2: SpO2: 98 % O2 Device:SpO2: 98 % O2 Flow Rate: .   IO: Intake/output summary:  Intake/Output Summary (Last 24 hours) at 02/11/17 1145 Last data filed at 02/11/17 0936  Gross per 24 hour  Intake              300 ml  Output               50 ml  Net              250 ml    LBM: Last BM Date: 02/07/17 Baseline Weight: Weight: 48.1 kg (106 lb) Most recent weight: Weight: 48.5 kg (107 lb)     Palliative Assessment/Data:   Flowsheet Rows     Most Recent Value  Intake Tab  Referral Department  Hospitalist  Unit at  Time of Referral  Med/Surg Unit  Palliative Care Primary Diagnosis  Cancer  Date Notified  02/10/17  Palliative Care  Type  New Palliative care  Reason for referral  Pain, Non-pain Symptom, Clarify Goals of Care  Date of Admission  02/06/17  Date first seen by Palliative Care  02/11/17  # of days Palliative referral response time  1 Day(s)  # of days IP prior to Palliative referral  4  Clinical Assessment  Palliative Performance Scale Score  40%  Pain Max last 24 hours  6  Pain Min Last 24 hours  3  Dyspnea Max Last 24 Hours  0  Dyspnea Min Last 24 hours  0  Nausea Max Last 24 Hours  3  Nausea Min Last 24 Hours  0  Psychosocial & Spiritual Assessment  Palliative Care Outcomes  Patient/Family meeting held?  Yes  Who was at the meeting?  pt  Palliative Care Outcomes  Improved pain interventions, Clarified goals of care, Provided psychosocial or spiritual support, Transitioned to hospice, Improved non-pain symptom therapy  Patient/Family wishes: Interventions discontinued/not started   Mechanical Ventilation, BiPAP, Hemodialysis, Transfusion, Vasopressors, Trach, NIPPV, Tube feedings/TPN, Antibiotics, PEG  Palliative Care follow-up planned  Yes, Facility      Time In: 0830 Time Out: 0945 Time Total: 75 min Greater than 50%  of this time was spent counseling and coordinating care related to the above assessment and plan. Staffed with Dr. Posey Pronto  Signed by: Dory Horn, NP   Please contact Palliative Medicine Team phone at [phone] for questions and concerns.  For individual provider: See Shea Evans

## 2017-02-11 NOTE — Progress Notes (Signed)
[facility] Progress Note  Patient: [name] JJK:[id number]   PCP: Carol Ada, MD DOB: 09-23-1965   DOA: 02/06/2017   DOS: 02/11/2017   Date of Service: the patient was seen and examined on 02/11/2017  Subjective: Feeling somewhat better the pain is well-controlled. No nausea no vomiting.  Brief hospital course: Pt. 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 Currently further plan is arrange hospice and provide comfort measures.   Assessment and Plan: Small bowel obstruction Gen. surgery was consulted. Patient is actually advancing well.  Gen. surgery recommended to advance the diet and patient is currently tolerating soft diet.  Patient is at high risk for reoccurrence of SBO due to advance carcinomatosis as well as presence of hernia around the colostomy.  LGIB -blood in colostomy likely secondary to peritoneal carcinomatosis GI consulted.  No further inpatient workup recommended at present. Per recommendations from oncology and she'll started on Xarelto but now that she Has Been Changed from Active Treatment to Complete Comfort with History of GI Bleed Risk of Anticoagulation outweighs the benefit.  Dizziness and lightheadedness. From profound dehydration. Hyponatremia is also likely secondary from the same. Improved with IV hydration.  Anemia  S/p 2 units prbc 7/17 for Hgb 9.2=>7.4, w mild hypotension Hemoglobin stabilized.  Hyperkalemia resolved Monitor daily BMP  Recent PE 7/11 BLE [place] negative for DVT. Per recommendation from hematology no indication for IVC filter at present.  Hyponatremia improving  Goals of care discussion I have been trying to talk to the patient regarding her course of care as well as regarding involving palliative care in her  treatment team given there is evidence of advancing disease on CT scan and patient declined with poor by mouth intake and severe protein calorie malnutrition. Patient finally opened up to her oncologist yesterday and complained about uncontrolled pain. Highly appreciate input from Dr. Burr Medico, and in her assistance with discussing goals of care for the patient. Goal has been transitioned to comfort and palliation with probable plan for home hospice. [facility] has been involved in patient's care. At present with persistent nausea, minimal output from ostomy as well as uncontrolled pain I'm concerned regarding patient's discharge home and with minimal oral intake and concern regarding patient's prognosis which is probably less than 4 weeks. Palliative care consulted to assist in ensuring optimal care for the patient. Currently we will transition her IV narcotics to by mouth as well as patch and monitor overnight. If the patient's pain control is adequate on this change will likely transition her to home with hospice tomorrow.  Diet: soft diet DVT Prophylaxis: Comfort care  Advance goals of care discussion: DNR/DNI  Family Communication: no family was present at bedside, at the time of interview.  Disposition:  Discharge to be determined.   Consultants: oncology, general surgery, gastroenterology, palliative care Procedures: PRBC  Antibiotics: Anti-infectives    None       Objective: Physical Exam: Vitals:   02/10/17 2019 02/11/17 0411 02/11/17 0935 02/11/17 1718  BP: 121/86 119/87 122/84 132/76  Pulse: (!) 123 (!) 127 100 90  Resp: 18 16 16 16   Temp: 97.9 F (36.6 C) 98 F (36.7 C) 98.4 F (36.9 C) 98.2 F (36.8 C)  TempSrc: Oral Oral Oral Oral  SpO2: 100% 99% 98% 98%  Weight: 48.5 kg (107 lb)  Height:        Intake/Output Summary (Last 24 hours) at 02/11/17 2015 Last data filed at 02/11/17 1815  Gross per 24 hour  Intake              480 ml  Output                50 ml  Net              430 ml   Filed Weights   02/08/17 2048 02/09/17 2216 02/10/17 2019  Weight: 49 kg (108 lb) 48.5 kg (106 lb 14.8 oz) 48.5 kg (107 lb)   General: Alert, Awake and Oriented to Time, Place and Person. Appear in Marked distress, affect flat Eyes: PERRL, Conjunctiva normal ENT: Oral Mucosa clear moist. Neck: no JVD, no Abnormal Mass Or lumps Cardiovascular: S1 and S2 Present, no Murmur, Peripheral Pulses Present Respiratory: normal respiratory effort, Bilateral Air entry equal and Decreased, no use of accessory muscle, Clear to Auscultation, no Crackles, no wheezes Abdomen: Bowel Sound present, Soft and no tenderness, no hernia Skin: no redness, no Rash, no induration Extremities: no Pedal edema, no calf tenderness Neurologic: Grossly no focal neuro deficit. Bilaterally Equal motor strength  Data Reviewed: CBC:  Recent Labs Lab 02/06/17 0020 02/06/17 0624 02/06/17 1818 02/07/17 0338 02/08/17 1007 02/09/17 0452  WBC 10.7* 9.7  --  10.0 9.2 7.6  NEUTROABS 7.6  --   --   --   --   --   HGB 9.2* 7.4* 10.2* 9.4* 10.2* 9.3*  HCT 29.8* 23.5* 30.9* 28.2* 30.8* 27.9*  MCV 79.7 79.4  --  79.4 80.4 80.4  PLT 346 305  --  229 220 657   Basic Metabolic Panel:  Recent Labs Lab 02/06/17 0020 02/06/17 0624 02/07/17 0338 02/09/17 0452 02/09/17 1512  NA 125* 125* 128* 127* 129*  K 5.4* 4.8 4.1 3.9 3.6  CL 92* 95* 102 102 104  CO2 24 23 22 22 22   GLUCOSE 99 88 75 89 107*  BUN 24* 24* 16 8 8   CREATININE 0.72 0.61 0.47 0.40* 0.41*  CALCIUM 6.7* 6.3* 6.7* 6.9* 6.9*  MG  --   --   --  1.5*  --     Liver Function Tests:  Recent Labs Lab 02/06/17 0020 02/07/17 0338  AST 88* 35  ALT 47 29  ALKPHOS 233* 171*  BILITOT 0.9 1.4*  PROT 5.1* 3.6*  ALBUMIN 1.9* 1.4*   No results for input(s): LIPASE, AMYLASE in the last 168 hours. No results for input(s): AMMONIA in the last 168 hours. Coagulation Profile: No results for input(s): INR, PROTIME in the last  168 hours. Cardiac Enzymes: No results for input(s): CKTOTAL, CKMB, CKMBINDEX, TROPONINI in the last 168 hours. BNP (last 3 results) No results for input(s): PROBNP in the last 8760 hours. CBG: No results for input(s): GLUCAP in the last 168 hours. Studies: No results found.  Scheduled Meds: . feeding supplement (PRO-STAT SUGAR FREE 64)  30 mL Oral BID  . fentaNYL  37.5 mcg Transdermal Q72H  . pantoprazole (PROTONIX) IV  40 mg Intravenous Q12H  . senna  2 tablet Oral Daily  . sodium chloride flush  3 mL Intravenous Q12H   Continuous Infusions:  PRN Meds: acetaminophen **OR** acetaminophen, ALPRAZolam, cyclobenzaprine, ondansetron **OR** ondansetron (ZOFRAN) IV, oxyCODONE, promethazine, simethicone  Time spent: 40 minutes  Author: Berle Mull, MD [facility] Pager: [phone] 02/11/2017 8:15 PM  If 7PM-7AM, please contact night-coverage at [email or web address], password [facility]

## 2017-02-12 MED ORDER — SIMETHICONE 80 MG PO CHEW: 80.0000 mg | CHEWABLE_TABLET | Freq: Four times a day (QID) | ORAL | 0 refills | Status: AC

## 2017-02-12 MED ORDER — FENTANYL 12 MCG/HR TD PT72: 37.0000 ug | MEDICATED_PATCH | TRANSDERMAL | 0 refills | Status: AC

## 2017-02-12 MED ORDER — OXYCODONE HCL 10 MG PO TABS: 10.0000 mg | ORAL_TABLET | ORAL | 0 refills | Status: AC | PRN

## 2017-02-12 MED ORDER — HEPARIN SOD (PORK) LOCK FLUSH 100 UNIT/ML IV SOLN
500.0000 [IU] | INTRAVENOUS | Status: AC | PRN
  Administered 2017-02-12: 500 [IU]

## 2017-02-12 MED ORDER — SENNA 8.6 MG PO TABS: 2.0000 | ORAL_TABLET | Freq: Every day | ORAL | 0 refills | Status: AC

## 2017-02-12 MED ORDER — PRO-STAT SUGAR FREE PO LIQD: 30.0000 mL | Freq: Two times a day (BID) | ORAL | 0 refills | Status: AC

## 2017-02-12 NOTE — Progress Notes (Signed)
Patient discharged to home on Hospice via [place]. Patient is DNR with all paperwork completed and given to [facility]. Instructions reviewed with [name]. All belongings and paperwork and scripts given to family. Patient left unit in stable condition.  Sheliah Plane RN

## 2017-02-12 NOTE — Care Management Note (Signed)
Case Management Note  Patient Details  Name: [name] MRN: [id number] Date of Birth: Mar 28, 1966  Subjective/Objective: Spoke with Posey Pronto, MD as pt is to be discharged this am. [facility] has been following and all arrangements have all been made. Pt/Family will need DNR form as well as, prescriptions for Comfort meds. CM will be on the look out for DCS to fax to [facility]                   Action/Plan:CM will follow closely for disposition/discharge needs.    Expected Discharge Date:  02/12/17               Expected Discharge Plan:  Home w Hospice Care  In-House Referral:  NA  Discharge planning Services  CM Consult  Post Acute Care Choice:  Hospice Choice offered to:  Patient  DME Arranged:    DME Agency:     HH Arranged:    HH Agency:  [facility]  Status of Service:  Completed, signed off  If discussed at [place] of Stay Meetings, dates discussed:    Additional Comments:  [name], RN 02/12/2017, 1:00 PM

## 2017-02-12 NOTE — Progress Notes (Signed)
Daily Progress Note   Patient Name: [name]       Date: 02/12/2017 DOB: 03-Oct-1965  Age: 51 y.o. MRN#: [id number] Attending Physician: Lavina Hamman, MD Primary Care Physician: Carol Ada, MD Admit Date: 02/06/2017  Reason for Consultation/Follow-up: Establishing goals of care, Hospice Evaluation, Inpatient hospice referral and Non pain symptom management  Subjective: Patient is reporting that her pain is much improved. Per chart review she has had for breakthrough doses of oxycodone 10 mg by mouth. Fentanyl patch increased on 02/11/2017 to 37 g every 72 hours. She has not had any nausea or vomiting and 24 hours. She is taking in small amounts of food and liquid without difficulty. She is verbalizing readiness to go home. She feels slightly sleepy today, but alert and oriented 3, but with minimal assist to the bathroom and overall feels improved  Length of Stay: 6  Current Medications: Scheduled Meds:  . feeding supplement (PRO-STAT SUGAR FREE 64)  30 mL Oral BID  . fentaNYL  37.5 mcg Transdermal Q72H  . pantoprazole (PROTONIX) IV  40 mg Intravenous Q12H  . senna  2 tablet Oral Daily  . sodium chloride flush  3 mL Intravenous Q12H    Continuous Infusions:   PRN Meds: acetaminophen **OR** acetaminophen, ALPRAZolam, cyclobenzaprine, ondansetron **OR** ondansetron (ZOFRAN) IV, oxyCODONE, promethazine, simethicone  Physical Exam  Constitutional: She is oriented to person, place, and time.  Frail, pale, appears ill older female; in no acute distress  HENT:  Head: Normocephalic and atraumatic.  Cardiovascular:  Tachycardic  Pulmonary/Chest: Effort normal.  Musculoskeletal: Normal range of motion.  Neurological: She is alert and oriented to person, place, and time.  Skin:  Skin is warm and dry. There is pallor.  Psychiatric: Her behavior is normal. Judgment and thought content normal.  Affect constricted  Nursing note and vitals reviewed.           Vital Signs: BP 110/81 (BP Location: Right Arm)   Pulse (!) 122   Temp 98.2 F (36.8 C) (Oral)   Resp 18   Ht 5' (1.524 m)   Wt 48.5 kg (107 lb)   SpO2 100%   BMI 20.90 kg/m  SpO2: SpO2: 100 % O2 Device: O2 Device: Not Delivered O2 Flow Rate:    Intake/output summary:  Intake/Output Summary (Last 24 hours) at 02/12/17  [place] filed at 02/12/17 0900  Gross per 24 hour  Intake              360 ml  Output                0 ml  Net              360 ml   LBM: Last BM Date: 02/07/17 Baseline Weight: Weight: 48.1 kg (106 lb) Most recent weight: Weight: 48.5 kg (107 lb)       Palliative Assessment/Data:    Flowsheet Rows     Most Recent Value  Intake Tab  Referral Department  Hospitalist  Unit at Time of Referral  Med/Surg Unit  Palliative Care Primary Diagnosis  Cancer  Date Notified  02/10/17  Palliative Care Type  New Palliative care  Reason for referral  Pain, Non-pain Symptom, Clarify Goals of Care  Date of Admission  02/06/17  Date first seen by Palliative Care  02/11/17  # of days Palliative referral response time  1 Day(s)  # of days IP prior to Palliative referral  4  Clinical Assessment  Palliative Performance Scale Score  40%  Pain Max last 24 hours  6  Pain Min Last 24 hours  3  Dyspnea Max Last 24 Hours  0  Dyspnea Min Last 24 hours  0  Nausea Max Last 24 Hours  3  Nausea Min Last 24 Hours  0  Psychosocial & Spiritual Assessment  Palliative Care Outcomes  Patient/Family meeting held?  Yes  Who was at the meeting?  pt  Palliative Care Outcomes  Improved pain interventions, Clarified goals of care, Provided psychosocial or spiritual support, Transitioned to hospice, Improved non-pain symptom therapy  Patient/Family wishes: Interventions discontinued/not started    Mechanical Ventilation, BiPAP, Hemodialysis, Transfusion, Vasopressors, Trach, NIPPV, Tube feedings/TPN, Antibiotics, PEG  Palliative Care follow-up planned  Yes, Facility      Patient Active Problem List   Diagnosis Date Noted  . Palliative care by specialist   . Lower GI bleed 02/06/2017  . GI bleed 02/06/2017  . PE (pulmonary thromboembolism) ([place]) 02/01/2017  . Anxiety 02/01/2017  . Protein-calorie malnutrition, severe ([place]) 02/01/2017  . Pulmonary embolism ([place]) 02/01/2017  . Pulmonary embolus ([place])   . Goals of care, counseling/discussion 11/04/2016  . Abdominal pain, generalized 10/10/2016  . UTI (urinary tract infection) 10/10/2016  . Hyperglycemia 10/10/2016  . Uncontrolled pain 10/10/2016  . Peritoneal carcinomatosis ([place]) 10/10/2016  . Malignant ascites 10/10/2016  . Malignant neoplasm of colon ([place])   . SBO (small bowel obstruction) ([place])   . Port catheter in place 06/23/2016  . Hand foot syndrome 01/08/2016  . Open abdominal wall wound 10/12/2015  . Hyperphosphatemia 09/22/2015  . Hypoalbuminemia due to protein-calorie malnutrition ([place]) 09/22/2015  . Insomnia 09/22/2015  . Dehydration 09/21/2015  . Hyponatremia 09/21/2015  . Hypotension 09/21/2015  . Transaminitis 09/21/2015  . Anemia, iron deficiency 09/19/2015  . Cancer of right colon ([place]) 09/04/2015  . S/P colectomy 08/03/2015  . Perforated appendicitis 04/02/2015  . Appendiceal abscess     Palliative Care Assessment & Plan   Patient Profile: 51 y.o. 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 Patient was found to have symptomatic anemia as well as small bowel obstruction. Asked to see patient regarding hospice as well as symptom management  With further adjustments to her medications,  increase fentanyl transdermal to 37 g, restarting oxycodone 10 mg orally  every 4 hours as needed for breakthrough pain; no nausea and vomiting over the past 24 hours and tolerating oral intake, she is ready for discharge home today she feels  Recommendations/Plan:  DC home with hospice and palliative care of [place] to follow and support in the home. Patient is waiting for equipment to be delivered before she is discharged  Pain: Continue with fentanyl 37 g patch every 72 hours (initiated 02/11/2017), oxycodone 10 mg by mouth every 4 hours as needed  Nausea and vomiting: Continue with Compazine, Zofran on an as-needed basis. In the future if as needed approach to nausea and vomiting in the setting of metastatic colon cancer worsens, Reglan may be an option for patient as she at this point does not have a small bowel obstruction   Goals of Care and Additional Recommendations:  Limitations on Scope of Treatment: Full Comfort Care  Code Status:    Code Status Orders        Start     Ordered   02/09/17 2029  Do not attempt resuscitation (DNR)  Continuous    Question Answer Comment  In the event of cardiac or respiratory ARREST Do not call a "code blue"   In the event of cardiac or respiratory ARREST Do not perform Intubation, CPR, defibrillation or ACLS   In the event of cardiac or respiratory ARREST Use medication by any route, position, wound care, and other measures to relive pain and suffering. May use oxygen, suction and manual treatment of airway obstruction as needed for comfort.      02/09/17 2028    Code Status History    Date Active Date Inactive Code Status Order ID Comments User Context   02/06/2017  3:31 AM 02/09/2017  8:28 PM Full Code [id number]  Etta Quill, DO ED   02/01/2017  1:58 AM 02/02/2017  5:14 PM Full Code [id number]  Ivor Costa, MD ED   10/10/2016  1:13 AM 10/20/2016 11:09 PM Full Code [id number]  Karmen Bongo, MD ED   08/03/2015  5:18 PM 08/11/2015  9:32 PM Full Code [id number]  Rolm Bookbinder, MD Inpatient   04/02/2015  3:31 AM  04/07/2015  3:10 PM Full Code [id number]  Rolm Bookbinder, MD Inpatient    Advance Directive Documentation     Most Recent Value  Type of Advance Directive  Healthcare Power of Attorney  Pre-existing out of facility DNR order (yellow form or pink MOST form)  -  "MOST" Form in Place?  -       Prognosis:   < 6 weeks  Discharge Planning:  Home with Hospice  Care plan was discussed with Dr. Posey Pronto  Thank you for allowing the Palliative Medicine Team to assist in the care of this patient.   Time In: 0900 Time Out: 0930 Total Time 30 min Prolonged Time Billed  no       Greater than 50%  of this time was spent counseling and coordinating care related to the above assessment and plan.  Dory Horn, NP  Please contact Palliative Medicine Team phone at [phone] for questions and concerns.

## 2017-02-12 NOTE — Care Management Note (Signed)
Case Management Note  Patient Details  Name: [name] MRN: [id number] Date of Birth: 1966-03-03  Subjective/Objective:  DCS has been faxed to [place]. Spoke with [name], CSW who informs  That RN will be admitting to Hospice at 1800 this pm. [name], RN is aware and will call [facility] when [name] arrives on floor to accompany pt home. No other CM needs at this time.                   Action/Plan:CM will sign off for now but will be available should additional discharge needs arise or disposition change.    Expected Discharge Date:  02/12/17               Expected Discharge Plan:  Home w Hospice Care  In-House Referral:  NA  Discharge planning Services  CM Consult  Post Acute Care Choice:  Hospice Choice offered to:  Patient  DME Arranged:    DME Agency:     HH Arranged:    HH Agency:  [facility]  Status of Service:  Completed, signed off  If discussed at [place] of Stay Meetings, dates discussed:    Additional Comments:  [name], RN 02/12/2017, 3:24 PM

## 2017-02-12 NOTE — Progress Notes (Signed)
[facility] with MD earlier today to confirm plan is for patient to discharge today. Made MD, RNCM, patient and Ricky aware of plan for [facility] RN to see patient this evening at 6:00. Confirmed with Audry Pili that DME was delivered. Left brochure in room with phone number to call [facility] when patient arrives home. Discharge summary was faxed earlier this afternoon.  Thank you,  Erling Conte, LCSW [phone]

## 2017-02-12 NOTE — Discharge Summary (Signed)
[facility] Discharge Summary   Patient: [name] ZOX:[id number]   PCP: Carol Ada, MD DOB: 09-11-1965   Date of admission: 02/06/2017   Date of discharge:  02/12/2017    Discharge Diagnoses:  Principal Problem:   Lower GI bleed Active Problems:   Cancer of right colon (HCC)   Anemia, iron deficiency   Hyponatremia   Pulmonary embolus ([place])   GI bleed   Palliative care by specialist  Admitted From: home Disposition:  Home with hospice  Recommendations for Outpatient Follow-up:  1. Please continue to follow up with hospice.   Follow-up Information    Carol Ada, MD Follow up.   Specialty:  Family Medicine Contact information: [place] [address] [phone]          Diet recommendation: regular diet  Activity: The patient is advised to gradually reintroduce usual activities.  Discharge Condition: good  Code Status: DNR DNI  History of present illness: As per the H and P dictated on admission, "[name] is a 51 y.o. 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  Patient had colon resection with colostomy creation earlier this year in Jan.  Patient was just admitted to our service from 7/10 to 7/12 with new small PE causing chest pain.  No R heart strain evident on CTA chest at that time.  She was started on Xarelto and discharged.  Yesterday patient felt well, today she had nausea and dry heaves and felt generally weak throughout the day.  Tonight she started having bleeding into her colostomy bag.  She has filled the colostomy bag twice so far with blood.  No abd pain."  Hospital Course:  Summary of her active problems in the hospital is as following. Small bowel obstruction Gen. surgery was consulted. Patient is actually advancing well.  Gen. surgery recommended to advance  the diet and patient is currently tolerating soft diet.  Patient is at high risk for reoccurrence of SBO due to advance carcinomatosis as well as presence of hernia around the colostomy.  LGIB -blood in colostomy likely secondary to peritoneal carcinomatosis GI consulted.  No further inpatient workup recommended at present. Per recommendations from oncology and she'll started on Xarelto but now that she Has Been Changed from Active Treatment to Complete Comfort with History of GI Bleed Risk of Anticoagulation outweighs the benefit.  Dizziness and lightheadedness. From profound dehydration. Hyponatremia is also likely secondary from the same. Improved with IV hydration.  Anemia  S/p 2 units prbc 7/17 for Hgb 9.2=>7.4, w mild hypotension Hemoglobin stabilized.  Hyperkalemia resolved  Recent PE 7/11 BLE [place] negative for DVT. Per recommendation from hematology no indication for IVC filter at present.  Hyponatremia improving  Goals of care discussion Patient finally opened up to her oncologist and complained about uncontrolled pain. Highly appreciate input from Dr. Burr Medico, and in her assistance with discussing goals of care for the patient. Goal has been transitioned to comfort and palliation with probable plan for home hospice. [facility] has been involved in patient's care. At present with persistent nausea, minimal output from ostomy as well as uncontrolled pain I'm concerned regarding patient's discharge home and with minimal oral intake and concern regarding patient's prognosis which is probably less than 4 weeks. Palliative care consulted to assist in ensuring optimal care for the patient. Currently we will transition her  IV narcotics to by mouth as well as patch and monitor overnight. patient's pain control is adequate on this change so we will transition her to home with hospice.  All other chronic medical condition were stable during the hospitalization.  Patient  was ambulatory without any assistance. On the day of the discharge the patient's vitals were stable, and no other acute medical condition were reported by patient. the patient was felt safe to be discharge at home with home with hospice.  Procedures and Results:  PRBC   Consultations: oncology, general surgery, gastroenterology, palliative care  DISCHARGE MEDICATION: Current Discharge Medication List    START taking these medications   Details  Amino Acids-Protein Hydrolys (FEEDING SUPPLEMENT, PRO-STAT SUGAR FREE 64,) LIQD Take 30 mLs by mouth 2 (two) times daily. Qty: 900 mL, Refills: 0    fentaNYL (DURAGESIC - DOSED MCG/HR) 12 MCG/HR Place 3 patches (37.5 mcg total) onto the skin every 3 (three) days. Qty: 5 patch, Refills: 0    senna (SENOKOT) 8.6 MG TABS tablet Take 2 tablets (17.2 mg total) by mouth daily. Qty: 120 each, Refills: 0    simethicone (MYLICON) 80 MG chewable tablet Chew 1 tablet (80 mg total) by mouth 4 (four) times daily. Qty: 30 tablet, Refills: 0      CONTINUE these medications which have CHANGED   Details  oxyCODONE 10 MG TABS Take 1 tablet (10 mg total) by mouth every 4 (four) hours as needed (pain). Qty: 30 tablet, Refills: 0      CONTINUE these medications which have NOT CHANGED   Details  ALPRAZolam (XANAX) 0.25 MG tablet Take 1-2 tablets (0.25-0.5 mg total) by mouth at bedtime as needed for anxiety or sleep. Qty: 60 tablet, Refills: 0   Associated Diagnoses: Cancer of right colon (HCC)    cyclobenzaprine (FLEXERIL) 10 MG tablet Take 1 tablet (10 mg total) by mouth 3 (three) times daily as needed for muscle spasms. Qty: 60 tablet, Refills: 0   Associated Diagnoses: Cancer of right colon (HCC)    prochlorperazine (COMPAZINE) 10 MG tablet Take 1 tablet (10 mg total) by mouth every 6 (six) hours as needed for nausea or vomiting. Qty: 30 tablet, Refills: 2    clindamycin (CLINDAGEL) 1 % gel Apply topically 2 (two) times daily. Qty: 30 g, Refills:  0    lidocaine-prilocaine (EMLA) cream Apply 1 application topically as needed. Qty: 30 g, Refills: 2      STOP taking these medications     cobimetinib fumarate (COTELLIC) 20 MG tablet      morphine (MS CONTIN) 30 MG 12 hr tablet      traMADol (ULTRAM) 50 MG tablet      triamcinolone ointment (KENALOG) 0.5 %        Allergies  Allergen Reactions  . Latex Hives  . Cymbalta [Duloxetine Hcl] Rash  . Penicillins Rash    Has patient had a PCN reaction causing immediate rash, facial/tongue/throat swelling, SOB or lightheadedness with hypotension: No Has patient had a PCN reaction causing severe rash involving mucus membranes or skin necrosis: No Has patient had a PCN reaction that required hospitalization No Has patient had a PCN reaction occurring within the last 10 years: No If all of the above answers are "NO", then may proceed with Cephalosporin use.    Discharge Instructions    Diet general    Complete by:  As directed    Increase activity slowly    Complete by:  As directed  Discharge Exam: Filed Weights   02/08/17 2048 02/09/17 2216 02/10/17 2019  Weight: 49 kg (108 lb) 48.5 kg (106 lb 14.8 oz) 48.5 kg (107 lb)   Vitals:   02/12/17 0435 02/12/17 0924  BP: 115/89 110/81  Pulse: (!) 142 (!) 122  Resp: 18 18  Temp: 98.6 F (37 C) 98.2 F (36.8 C)   General: Appear in mild distress, no Rash; Oral Mucosa moist. Cardiovascular: S1 and S2 Present, no Murmur, no JVD Respiratory: Bilateral Air entry present and Clear to Auscultation, no Crackles, no wheezes Abdomen: Bowel Sound present, Soft and no tenderness Extremities: no Pedal edema, no calf tenderness Neurology: Grossly no focal neuro deficit.  The results of significant diagnostics from this hospitalization (including imaging, microbiology, ancillary and laboratory) are listed below for reference.    Significant Diagnostic Studies: Dg Chest 2 View  Result Date: 01/31/2017 CLINICAL DATA:  51 y/o  F;  chest pain. EXAM: CHEST  2 VIEW COMPARISON:  01/08/2017 chest radiograph.  12/22/2016 chest CT there FINDINGS: Stable normal cardiac silhouette. Right port catheter tip projects over lower SVC. No consolidation, effusion, or pneumothorax. Pulmonary nodules on CT are not visualized radiographically. No acute osseous abnormality is evident. IMPRESSION: No acute pulmonary process identified. Electronically Signed   By: Kristine Garbe M.D.   On: 01/31/2017 18:33   Ct Angio Chest Pe W And/or Wo Contrast  Result Date: 02/01/2017 CLINICAL DATA:  Chest pressure for 3 days EXAM: CT ANGIOGRAPHY CHEST WITH CONTRAST TECHNIQUE: Multidetector CT imaging of the chest was performed using the standard protocol during bolus administration of intravenous contrast. Multiplanar CT image reconstructions and MIPs were obtained to evaluate the vascular anatomy. CONTRAST:  55 mL Isovue 370 intravenous COMPARISON:  Radiograph 01/31/2017, CT chest 12/22/2016, 10/05/2016 FINDINGS: Cardiovascular: Satisfactory opacification of the pulmonary arteries to the segmental level. Small nonocclusive filling defect within a left lower lobe segmental/subsegmental pulmonary artery branch vessel. RV LV ratio is normal. No other filling defects are visualized. Non aneurysmal aorta. No dissection. Nonenlarged heart. No pericardial effusion. Right-sided central venous port tip within the SVC. Minimal atherosclerosis. Mediastinum/Nodes: No significantly enlarged mediastinal lymph nodes. Midline trachea. No thyroid mass. Esophagus within normal limits. Lungs/Pleura: Multiple bilateral pulmonary nodules are again visualized. Index right middle lobe pulmonary nodule, series 7, image number 44, measures 7 mm compared to 5 mm previously. Index left upper lobe pulmonary nodule, series 7, image number 37 measures 8 mm, compared with 6 mm previously. No pleural effusion. No acute consolidation. Upper Abdomen: Peritoneal carcinomatosis with scalloped  appearance of the liver and spleen. Calcifications within the low-density surrounding the liver. Heterogenous splenic enhancement likely due to arterial phase imaging. Dilated bowel in the upper abdomen. Postsurgical changes of the stomach. Dilated gallbladder measuring up to 4.6 cm. Musculoskeletal: No acute or suspicious bone lesion Review of the MIP images confirms the above findings. IMPRESSION: 1. Positive for small nonocclusive embolus in a left lower lobe segmental/subsegmental branch vessels. No elevation of the RV LV ratio. No other filling defects are seen. 2. Multiple bilateral pulmonary nodules. Slight increased size of several pulmonary nodules as described above. 3. Peritoneal carcinomatosis partially imaged. 4. Dilated bowel in the upper abdomen, could relate to a bowel obstruction 5. Dilated gallbladder. Critical Value/emergent results were called by telephone at the time of interpretation on 02/01/2017 at 12:37 am to [place] who assumed care for the patient for [facility] , who verbally acknowledged these results. Aortic Atherosclerosis (ICD10-I70.0). Electronically Signed   By: Madie Reno.D.  On: 02/01/2017 00:38   Ct Abdomen Pelvis W Contrast  Result Date: 02/06/2017 CLINICAL DATA:  Generalized abdominal pain. GI bleed. Stage IV metastatic cecal colon cancer. Ileus CK ectomy January 2017. Transverse loop colostomy 10/13/2016. Inpatient. Admitted with acute pulmonary embolus, placed on Xarelto anticoagulation. EXAM: CT ABDOMEN AND PELVIS WITH CONTRAST TECHNIQUE: Multidetector CT imaging of the abdomen and pelvis was performed using the standard protocol following bolus administration of intravenous contrast. CONTRAST:  100 cc ISOVUE-300 IOPAMIDOL (ISOVUE-300) INJECTION 61% COMPARISON:  12/22/2016 CT abdomen.  10/09/2016 CT abdomen/pelvis. FINDINGS: Lower chest: Re- demonstration of tiny pulmonary embolus in the left lower lobe (series 3/ image 2), not appreciably changed. Tip of superior  approach central venous catheter is seen at the right cavoatrial junction. Hepatobiliary: There are several (at least 5) hypodense parenchymal liver masses scattered throughout the liver, all increased in size, largest 1.2 cm adjacent to the IVC (series 3/ image 21), previously 0.7 cm. Scalloping of the liver capsule by numerous perihepatic space calcified peritoneal implants is not appreciably changed, largest 2.8 cm thickness in the anterior liver (series 3/image 23), previously 2.7 cm. Prominently distended gallbladder (5.0 cm diameter). No radiopaque cholelithiasis. No definite gallbladder wall thickening. No biliary ductal dilatation. Common bile duct diameter 4 mm. Pancreas: Normal, with no mass or duct dilation. Spleen: Normal size spleen. Stable scalloping of the splenic capsule by peritoneal implants, largest 1.7 cm superiorly (series 3/ image 17). Adrenals/Urinary Tract: No discrete adrenal nodules. No hydronephrosis. No renal masses. Relatively collapsed bladder contains a small amount of excreted contrast and is otherwise normal. Stomach/Bowel: Stable postsurgical changes from gastric bypass surgery with intact appearing gastrojejunostomy and relatively collapsed excluded distal stomach. Patient is status post ileocecal resection with neo ileocolic anastomosis in the central abdomen. Patient is status post transverse loop colostomy in the ventral left abdominal wall. 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 There is prominent dilation of small bowel loops throughout the abdomen measuring up to 6.3 cm diameter, although the site of small bowel caliber transition appears to be in the right pelvis at the site of multiple infiltrative enlarging peritoneal tumor implants and not at the site of the parastomal hernia. Oral contrast traverses to the mid to distal small bowel. There is retained barium in the collapsed distal large-bowel. Ileocolic  anastomosis appears intact. Vascular/Lymphatic: Normal caliber abdominal aorta. Patent hepatic, portal, splenic and renal veins . No pathologically enlarged lymph nodes in the abdomen or pelvis. Reproductive: Status post hysterectomy, with no abnormal findings at the vaginal cuff. Other: No pneumoperitoneum. Interval growth of bulky peritoneal tumor implants in the ventral and right lower peritoneal cavity. For example a 7.7 x 5.5 cm right lower quadrant implant (series 3/ image 55), previously 7.1 x 4.6 cm on 12/22/2016 using similar measurement technique, increased. A 7.0 x 5.1 cm anterior peritoneal implant (series 3/ image 40), previously 5.1 x 4.0 cm, increased. Moderate anasarca. Musculoskeletal: No aggressive appearing focal osseous lesions. IMPRESSION: 1. CT findings are suggestive of a distal small bowel obstruction due to enlarging infiltrative peritoneal tumor implants in the right lower quadrant. Although there is a new parastomal hernia containing a small bowel loop adjacent to the loop colostomy in the ventral left abdominal wall, the site of small-bowel obstruction does not appear to correlate with the parastomal hernia site. 2. Progressive peritoneal carcinomatosis particularly in the ventral and right lower peritoneal cavity as detailed . The perihepatic and perisplenic tumor implants are relatively  stable. 3. Progression of liver parenchymal metastases. 4. Nonspecific prominent gallbladder distention without biliary ductal dilatation. No radiopaque cholelithiasis . 5. Moderate anasarca. Electronically Signed   By: Ilona Sorrel M.D.   On: 02/06/2017 21:41    Microbiology: No results found for this or any previous visit (from the past 240 hour(s)).   Labs: CBC:  Recent Labs Lab 02/06/17 0020 02/06/17 0624 02/06/17 1818 02/07/17 0338 02/08/17 1007 02/09/17 0452  WBC 10.7* 9.7  --  10.0 9.2 7.6  NEUTROABS 7.6  --   --   --   --   --   HGB 9.2* 7.4* 10.2* 9.4* 10.2* 9.3*  HCT 29.8*  23.5* 30.9* 28.2* 30.8* 27.9*  MCV 79.7 79.4  --  79.4 80.4 80.4  PLT 346 305  --  229 220 650   Basic Metabolic Panel:  Recent Labs Lab 02/06/17 0020 02/06/17 0624 02/07/17 0338 02/09/17 0452 02/09/17 1512  NA 125* 125* 128* 127* 129*  K 5.4* 4.8 4.1 3.9 3.6  CL 92* 95* 102 102 104  CO2 24 23 22 22 22   GLUCOSE 99 88 75 89 107*  BUN 24* 24* 16 8 8   CREATININE 0.72 0.61 0.47 0.40* 0.41*  CALCIUM 6.7* 6.3* 6.7* 6.9* 6.9*  MG  --   --   --  1.5*  --    Liver Function Tests:  Recent Labs Lab 02/06/17 0020 02/07/17 0338  AST 88* 35  ALT 47 29  ALKPHOS 233* 171*  BILITOT 0.9 1.4*  PROT 5.1* 3.6*  ALBUMIN 1.9* 1.4*   Time spent: 35 minutes  Signed:  Kyland No  [facility]  02/12/2017  , 2:49 PM

## 2017-02-13 ENCOUNTER — Telehealth: Payer: Self-pay | Admitting: Hematology

## 2017-02-13 NOTE — Telephone Encounter (Signed)
Faxed records to [facility] [phone]

## 2017-02-15 ENCOUNTER — Telehealth: Payer: Self-pay

## 2017-02-15 NOTE — Telephone Encounter (Signed)
Renee with [facility] called to inform [place] of pt death at home at 342 this afternoon.

## 2017-02-16 ENCOUNTER — Encounter: Payer: Self-pay | Admitting: *Deleted

## 2017-02-16 ENCOUNTER — Telehealth: Payer: Self-pay | Admitting: Hematology

## 2017-02-16 NOTE — Telephone Encounter (Signed)
Horris Latino called from hospice to inform us that Mrs Redel passed on March 06, 2023 at 3:42 at home

## 2017-02-20 ENCOUNTER — Ambulatory Visit: Payer: 59 | Admitting: Hematology

## 2017-02-20 ENCOUNTER — Ambulatory Visit: Payer: 59

## 2017-02-20 ENCOUNTER — Other Ambulatory Visit: Payer: 59

## 2017-02-22 DEATH — deceased

## 2017-03-08 ENCOUNTER — Other Ambulatory Visit: Payer: Self-pay | Admitting: Nurse Practitioner

## 2017-07-07 IMAGING — CT CT CHEST W/O CM
2 of 4 series · 15 of 36 positions shown, 18 images · non-contrast
Comparison: CT 07/02/2015 common 04/01/2015

CLINICAL DATA: Colon cancer staging.  Initial evaluation

EXAM:
CT CHEST WITHOUT CONTRAST
TECHNIQUE: Multidetector CT imaging of the chest was performed following the
standard protocol without IV contrast.

[Series 2: chest w/o st · axial · non-contrast · 0.67mm/px · z∈[-338,-48]mm · 12 of 70 slices shown, 15 images]
[im 6/70  mediastinal]
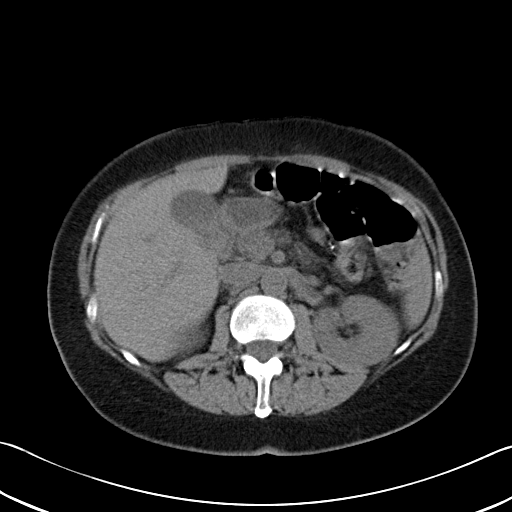
[im 6/70  lung]
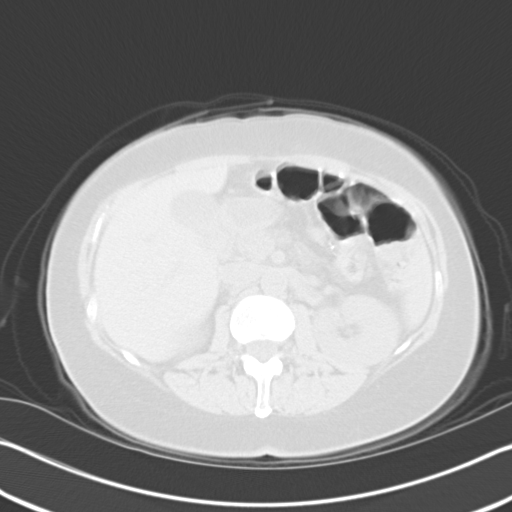
[im 11/70  lung]
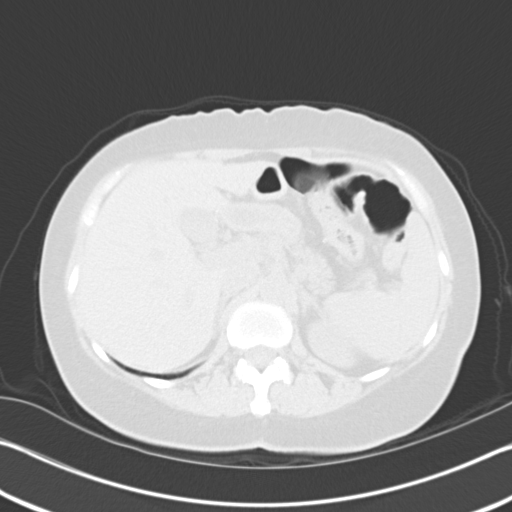
[im 16/70  lung]
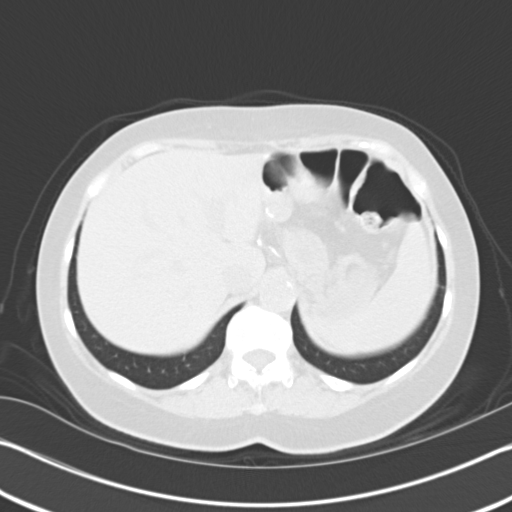
[im 22/70  lung]
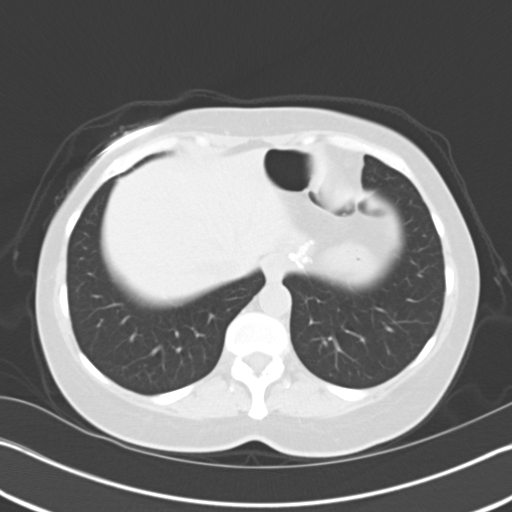
[im 27/70  mediastinal]
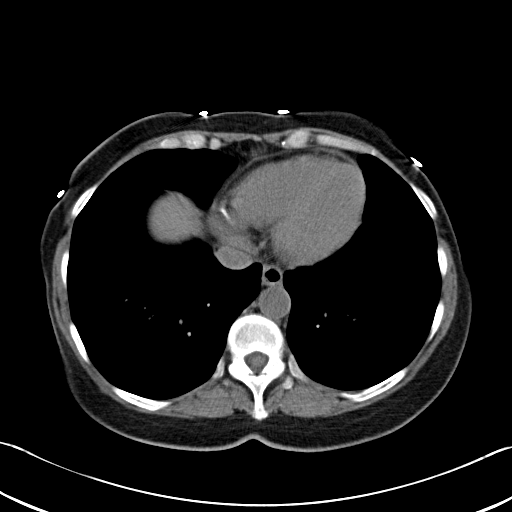
[im 27/70  lung]
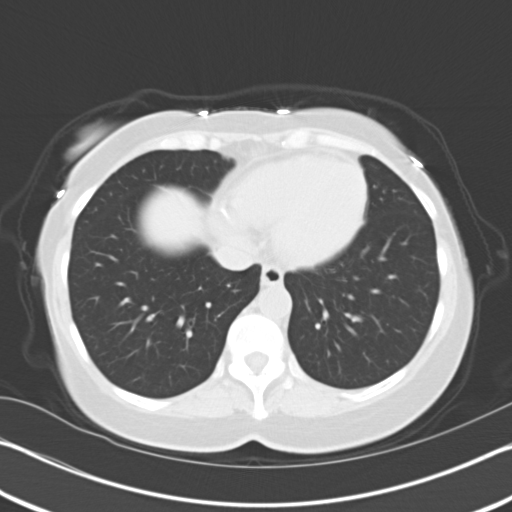
[im 32/70  lung]
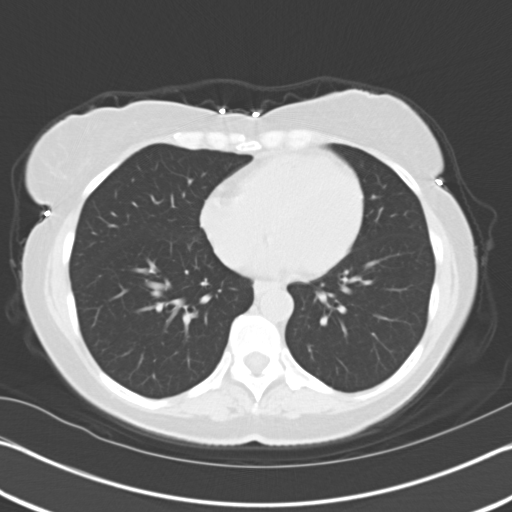
[im 38/70  lung]
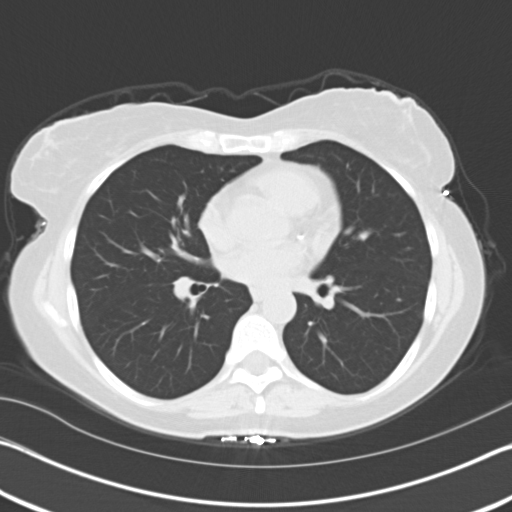
[im 43/70  lung]
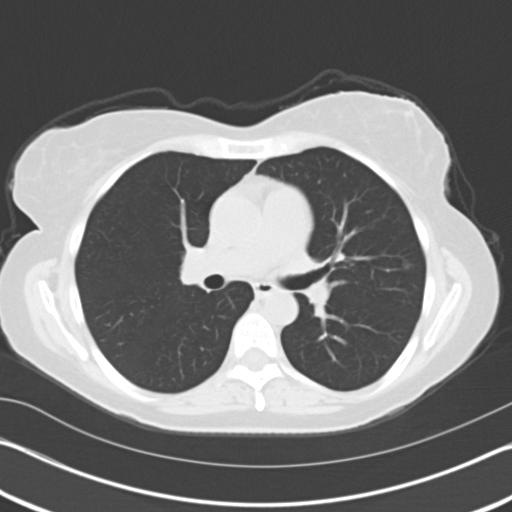
[im 48/70  mediastinal]
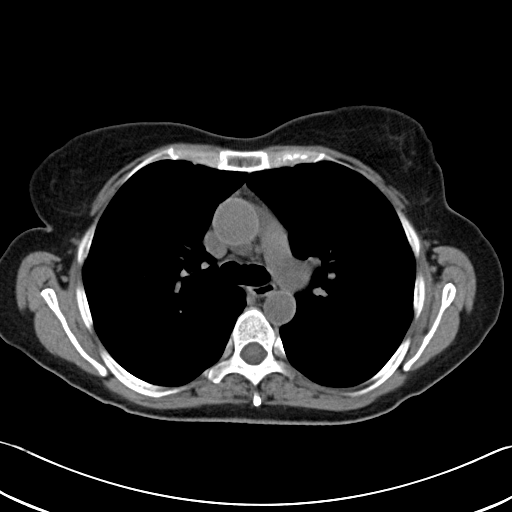
[im 48/70  lung]
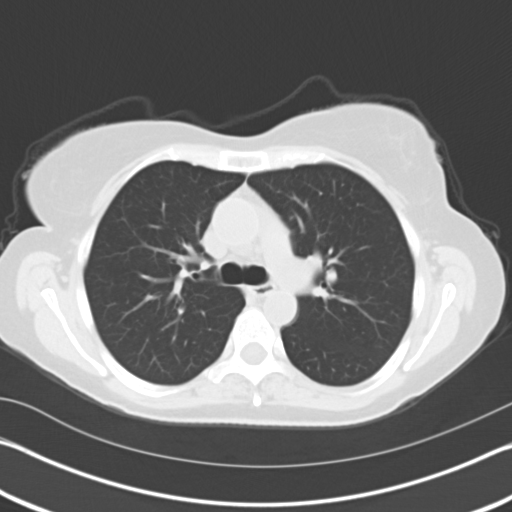
[im 54/70  lung]
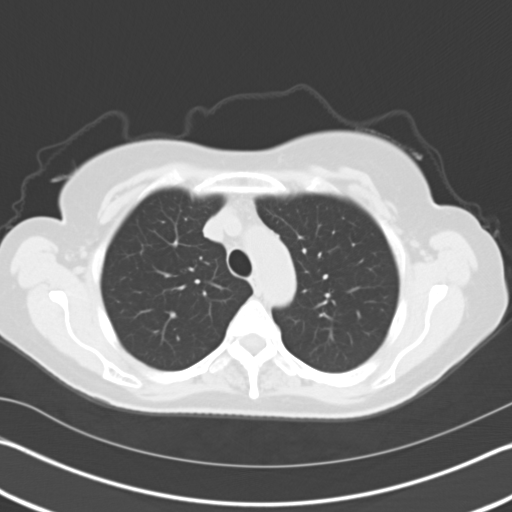
[im 59/70  lung]
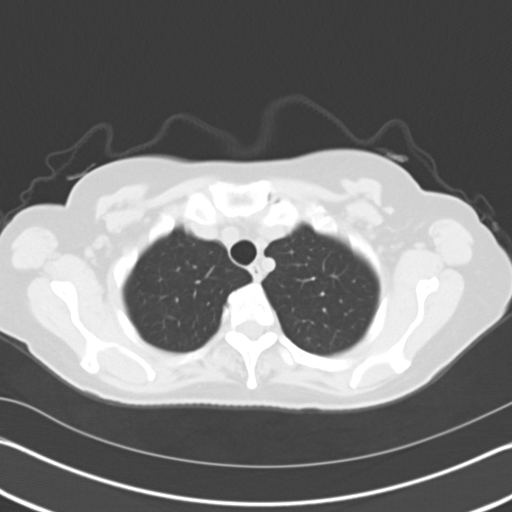
[im 64/70  lung]
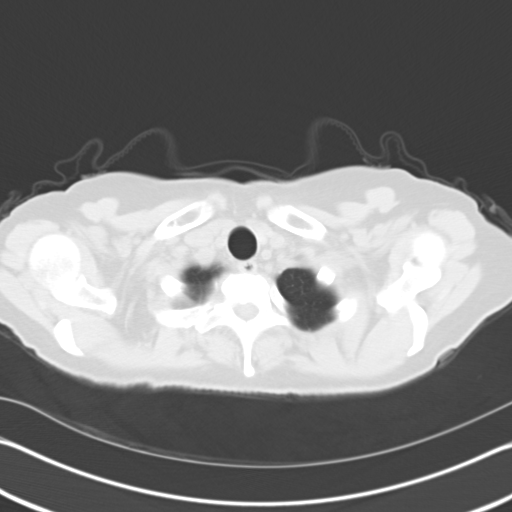

[Series 602: coronal · coronal · 0.68mm/px · 3 of 108 slices shown]
[im 22/108  lung]
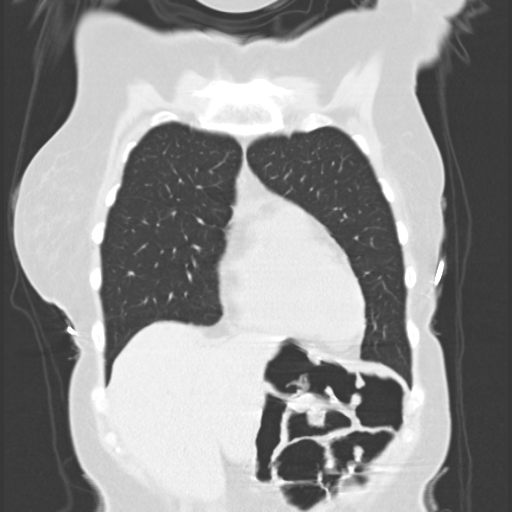
[im 43/108  lung]
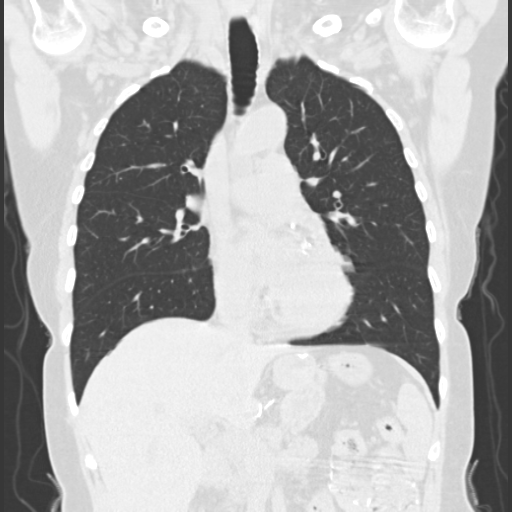
[im 65/108  lung]
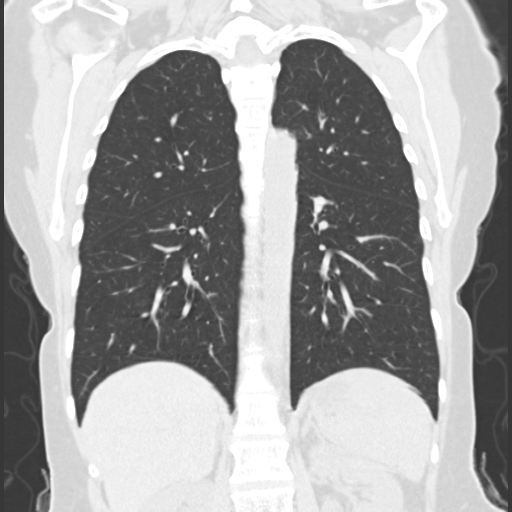

[15 of 36 positions shown; findings below may reference images not displayed]

FINDINGS: Mediastinum/Nodes: No axillary or supraclavicular adenopathy. No
mediastinal hilar adenopathy. No pericardial fluid. Esophagus
normal.

Lungs/Pleura: 6 mm nodule in the RIGHT lower lobe (image 38, series
5). This region was not imaged on comparison CT abdomen.

4 mm nodule in the LEFT lower lobe (image 41, series 5) is not
changed.

Upper abdomen: Limited view of the liver, kidneys, pancreas are
unremarkable. Normal adrenal glands. Post bariatric surgery.

Musculoskeletal: No aggressive osseous lesion.
IMPRESSION: 1. A 6 mm RIGHT lower lobe nodule not previously imaged. Recommend
attention on follow-up.
2. Stable small LEFT lower lobe pulmonary nodule compared to prior
CT.
3. No mediastinal lymphadenopathy.

## 2018-11-12 IMAGING — CR DG ABDOMEN ACUTE W/ 1V CHEST
3 series · 3 of 3 positions shown · non-contrast
Comparison: CT of the chest, abdomen and pelvis performed
12/22/2016

CLINICAL DATA: Acute onset of prolapsed colostomy. Initial
encounter.

EXAM:
DG ABDOMEN ACUTE W/ 1V CHEST

[w chest pa]
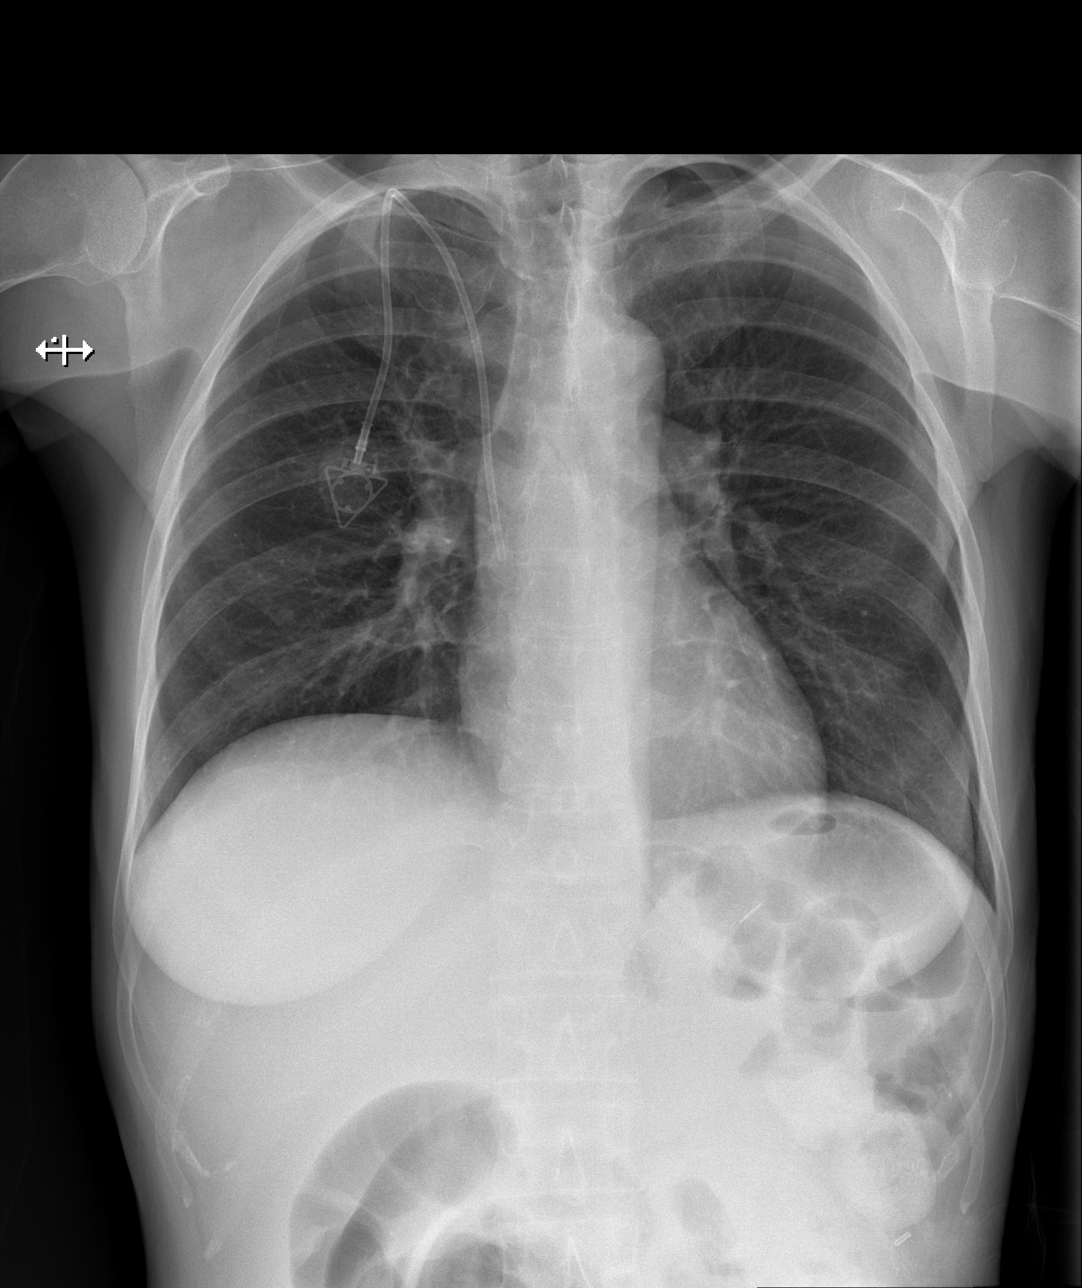

[w abdomen upright]
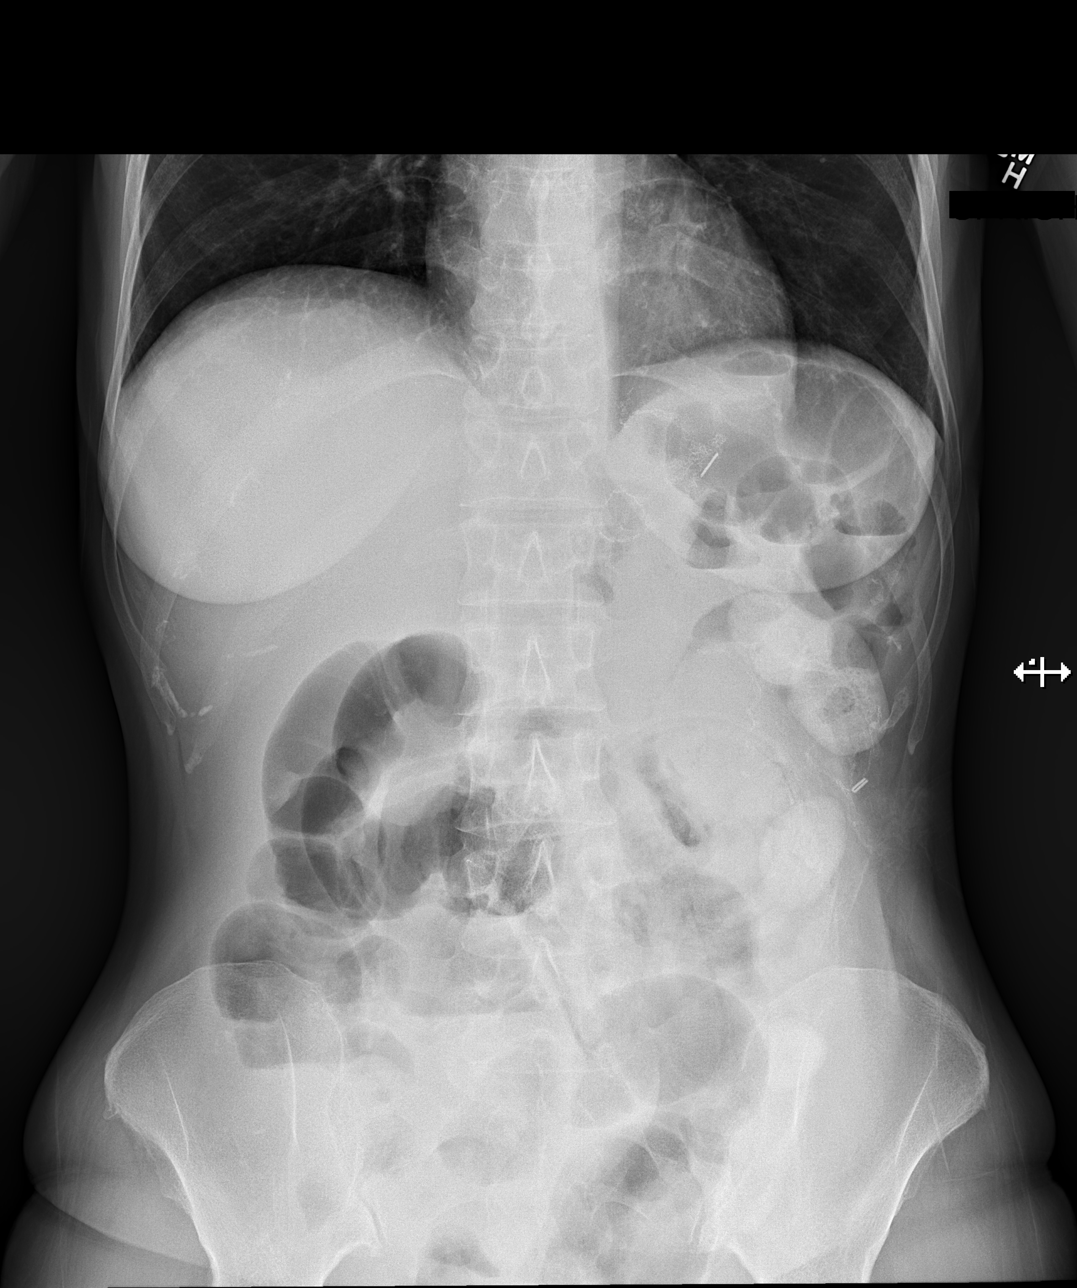

[t abdomen supine]
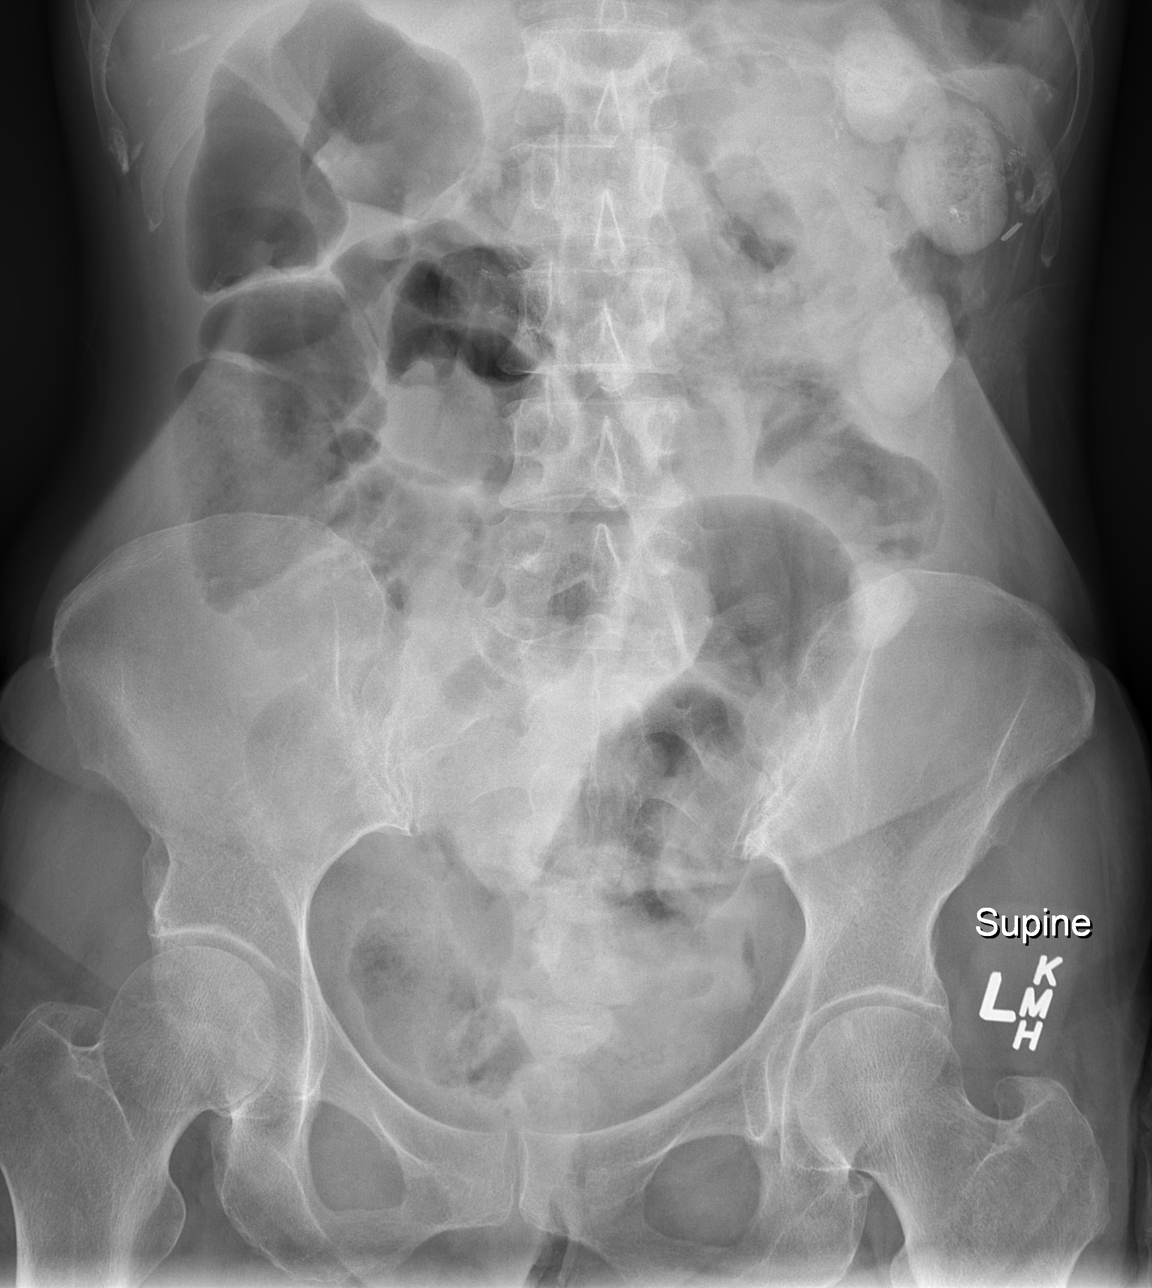

[3 of 3 positions shown; findings below may reference images not displayed]

FINDINGS: The lungs are well-aerated and clear. There is no evidence of focal
opacification, pleural effusion or pneumothorax. The
cardiomediastinal silhouette is within normal limits.

The visualized bowel gas pattern is unremarkable. Scattered stool
and air are seen within the colon; there is no evidence of small
bowel dilatation to suggest obstruction. No free intra-abdominal air
is identified on the provided upright view. The patient's right
chest port noted ending about the mid to distal SVC.

No acute osseous abnormalities are seen; the sacroiliac joints are
unremarkable in appearance. Postoperative change is noted about the
left upper quadrant.
IMPRESSION: 1. Unremarkable bowel gas pattern; no free intra-abdominal air seen.
Small to moderate amount of stool noted in the colon.
2. No acute cardiopulmonary process seen.
